# Patient Record
Sex: Female | Born: 1940 | Race: Black or African American | Hispanic: No | Marital: Single | Smoking: Former smoker
Health system: Southern US, Community
[De-identification: ages and names within clinical notes are randomized; demographics above are authoritative.]

## PROBLEM LIST (undated history)

## (undated) DIAGNOSIS — N183 Chronic kidney disease, stage 3 unspecified: Secondary | ICD-10-CM

## (undated) DIAGNOSIS — E1169 Type 2 diabetes mellitus with other specified complication: Secondary | ICD-10-CM

## (undated) DIAGNOSIS — I10 Essential (primary) hypertension: Secondary | ICD-10-CM

## (undated) DIAGNOSIS — F0390 Unspecified dementia without behavioral disturbance: Secondary | ICD-10-CM

## (undated) DIAGNOSIS — I429 Cardiomyopathy, unspecified: Secondary | ICD-10-CM

## (undated) DIAGNOSIS — F209 Schizophrenia, unspecified: Secondary | ICD-10-CM

## (undated) DIAGNOSIS — K219 Gastro-esophageal reflux disease without esophagitis: Secondary | ICD-10-CM

## (undated) DIAGNOSIS — E669 Obesity, unspecified: Secondary | ICD-10-CM

## (undated) DIAGNOSIS — C3490 Malignant neoplasm of unspecified part of unspecified bronchus or lung: Secondary | ICD-10-CM

## (undated) DIAGNOSIS — E785 Hyperlipidemia, unspecified: Secondary | ICD-10-CM

## (undated) DIAGNOSIS — F79 Unspecified intellectual disabilities: Secondary | ICD-10-CM

## (undated) DIAGNOSIS — J449 Chronic obstructive pulmonary disease, unspecified: Secondary | ICD-10-CM

## (undated) DIAGNOSIS — E119 Type 2 diabetes mellitus without complications: Secondary | ICD-10-CM

## (undated) DIAGNOSIS — I4892 Unspecified atrial flutter: Secondary | ICD-10-CM

## (undated) DIAGNOSIS — I495 Sick sinus syndrome: Secondary | ICD-10-CM

## (undated) HISTORY — DX: Malignant neoplasm of unspecified part of unspecified bronchus or lung: C34.90

## (undated) HISTORY — DX: Hyperlipidemia, unspecified: E78.5

## (undated) HISTORY — DX: Type 2 diabetes mellitus without complications: E11.9

## (undated) HISTORY — DX: Chronic obstructive pulmonary disease, unspecified: J44.9

## (undated) HISTORY — DX: Unspecified intellectual disabilities: F79

## (undated) HISTORY — DX: Gastro-esophageal reflux disease without esophagitis: K21.9

## (undated) HISTORY — DX: Unspecified dementia, unspecified severity, without behavioral disturbance, psychotic disturbance, mood disturbance, and anxiety: F03.90

## (undated) HISTORY — DX: Schizophrenia, unspecified: F20.9

## (undated) HISTORY — PX: ABDOMINAL HYSTERECTOMY: SHX81

---

## 2003-04-18 ENCOUNTER — Encounter (HOSPITAL_BASED_OUTPATIENT_CLINIC_OR_DEPARTMENT_OTHER): Admission: RE | Admit: 2003-04-18 | Discharge: 2003-04-25 | Payer: Self-pay | Admitting: Internal Medicine

## 2003-04-26 ENCOUNTER — Other Ambulatory Visit: Payer: Self-pay

## 2003-07-20 ENCOUNTER — Encounter (HOSPITAL_BASED_OUTPATIENT_CLINIC_OR_DEPARTMENT_OTHER): Admission: RE | Admit: 2003-07-20 | Discharge: 2003-08-18 | Payer: Self-pay | Admitting: Internal Medicine

## 2003-11-14 ENCOUNTER — Encounter (HOSPITAL_BASED_OUTPATIENT_CLINIC_OR_DEPARTMENT_OTHER): Admission: RE | Admit: 2003-11-14 | Discharge: 2003-11-24 | Payer: Self-pay | Admitting: Internal Medicine

## 2004-02-20 ENCOUNTER — Encounter (HOSPITAL_BASED_OUTPATIENT_CLINIC_OR_DEPARTMENT_OTHER): Admission: RE | Admit: 2004-02-20 | Discharge: 2004-04-12 | Payer: Self-pay | Admitting: Internal Medicine

## 2004-06-04 ENCOUNTER — Ambulatory Visit: Payer: Self-pay

## 2005-06-09 ENCOUNTER — Ambulatory Visit: Payer: Self-pay | Admitting: Family Medicine

## 2006-03-05 ENCOUNTER — Inpatient Hospital Stay: Payer: Self-pay

## 2006-04-24 ENCOUNTER — Emergency Department: Payer: Self-pay | Admitting: Emergency Medicine

## 2006-06-24 ENCOUNTER — Ambulatory Visit: Payer: Self-pay | Admitting: Family Medicine

## 2007-04-13 ENCOUNTER — Ambulatory Visit: Payer: Self-pay | Admitting: Gastroenterology

## 2007-06-22 ENCOUNTER — Ambulatory Visit: Payer: Self-pay | Admitting: Family Medicine

## 2007-10-07 ENCOUNTER — Ambulatory Visit: Payer: Self-pay

## 2007-11-17 ENCOUNTER — Ambulatory Visit: Payer: Self-pay

## 2008-08-17 ENCOUNTER — Emergency Department: Payer: Self-pay | Admitting: Emergency Medicine

## 2009-04-19 ENCOUNTER — Ambulatory Visit: Payer: Self-pay | Admitting: Family Medicine

## 2010-03-27 ENCOUNTER — Ambulatory Visit: Payer: Self-pay | Admitting: Gastroenterology

## 2010-04-01 LAB — PATHOLOGY REPORT

## 2010-04-22 ENCOUNTER — Ambulatory Visit: Payer: Self-pay | Admitting: Family Medicine

## 2011-05-20 ENCOUNTER — Ambulatory Visit: Payer: Self-pay | Admitting: Family Medicine

## 2011-06-14 ENCOUNTER — Ambulatory Visit: Payer: Self-pay | Admitting: Internal Medicine

## 2011-07-09 ENCOUNTER — Inpatient Hospital Stay: Payer: Self-pay | Admitting: Internal Medicine

## 2011-07-09 LAB — URINALYSIS, COMPLETE
Bilirubin,UR: NEGATIVE
Hyaline Cast: 6
Leukocyte Esterase: NEGATIVE
Ph: 5 (ref 4.5–8.0)
Protein: NEGATIVE
RBC,UR: 2 /HPF (ref 0–5)
Squamous Epithelial: NONE SEEN

## 2011-07-09 LAB — CBC
HGB: 11.3 g/dL — ABNORMAL LOW (ref 12.0–16.0)
MCH: 27.4 pg (ref 26.0–34.0)
MCHC: 31.7 g/dL — ABNORMAL LOW (ref 32.0–36.0)

## 2011-07-09 LAB — TROPONIN I: Troponin-I: <0.02 ng/mL

## 2011-07-09 LAB — PROTIME-INR: Prothrombin Time: 12.3 secs (ref 11.5–14.7)

## 2011-07-09 LAB — BASIC METABOLIC PANEL
BUN: 16 mg/dL (ref 7–18)
Calcium, Total: 8.4 mg/dL — ABNORMAL LOW (ref 8.5–10.1)
Chloride: 105 mmol/L (ref 98–107)
Co2: 26 mmol/L (ref 21–32)
EGFR (African American): >60
EGFR (Non-African Amer.): 59 — ABNORMAL LOW
Glucose: 119 mg/dL — ABNORMAL HIGH (ref 65–99)
Osmolality: 282 (ref 275–301)
Sodium: 140 mmol/L (ref 136–145)

## 2011-07-09 LAB — APTT: Activated PTT: 33.8 secs (ref 23.6–35.9)

## 2011-07-10 LAB — TROPONIN I
Troponin-I: <0.02 ng/mL
Troponin-I: <0.02 ng/mL

## 2011-07-10 LAB — CBC WITH DIFFERENTIAL/PLATELET
Basophil #: 0 10*3/uL (ref 0.0–0.1)
Basophil %: 0.5 %
Eosinophil #: 0.2 10*3/uL (ref 0.0–0.7)
Eosinophil %: 3.7 %
Lymphocyte #: 2.5 10*3/uL (ref 1.0–3.6)
Lymphocyte %: 38.4 %
MCH: 28 pg (ref 26.0–34.0)
MCHC: 33.1 g/dL (ref 32.0–36.0)
Monocyte #: 0.5 x10 3/mm (ref 0.2–0.9)
RBC: 4.07 10*6/uL (ref 3.80–5.20)
RDW: 14.5 % (ref 11.5–14.5)
WBC: 6.5 10*3/uL (ref 3.6–11.0)

## 2011-07-10 LAB — CK TOTAL AND CKMB (NOT AT ARMC)
CK, Total: 252 U/L — ABNORMAL HIGH (ref 21–215)
CK, Total: 190 U/L (ref 21–215)
CK, Total: 166 U/L (ref 21–215)
CK-MB: 1.4 ng/mL (ref 0.5–3.6)
CK-MB: 1.5 ng/mL (ref 0.5–3.6)
CK-MB: 1.3 ng/mL (ref 0.5–3.6)

## 2011-07-10 LAB — LIPID PANEL
HDL Cholesterol: 71 mg/dL — ABNORMAL HIGH (ref 40–60)
Ldl Cholesterol, Calc: 66 mg/dL (ref 0–100)
Triglycerides: 22 mg/dL (ref 0–200)
VLDL Cholesterol, Calc: 4 mg/dL — ABNORMAL LOW (ref 5–40)

## 2011-07-10 LAB — BASIC METABOLIC PANEL
BUN: 18 mg/dL (ref 7–18)
Chloride: 106 mmol/L (ref 98–107)
Creatinine: 0.92 mg/dL (ref 0.60–1.30)
Glucose: 86 mg/dL (ref 65–99)
Osmolality: 282 (ref 275–301)
Potassium: 3.4 mmol/L — ABNORMAL LOW (ref 3.5–5.1)
Sodium: 141 mmol/L (ref 136–145)

## 2011-07-14 ENCOUNTER — Ambulatory Visit: Payer: Self-pay | Admitting: Internal Medicine

## 2011-07-14 LAB — CREATININE, SERUM
Creatinine: 0.86 mg/dL (ref 0.60–1.30)
EGFR (African American): >60
EGFR (Non-African Amer.): >60

## 2011-07-22 ENCOUNTER — Ambulatory Visit: Payer: Self-pay | Admitting: Internal Medicine

## 2011-07-22 LAB — COMPREHENSIVE METABOLIC PANEL
Anion Gap: 8 (ref 7–16)
BUN: 17 mg/dL (ref 7–18)
Bilirubin,Total: 0.2 mg/dL (ref 0.2–1.0)
Calcium, Total: 9.1 mg/dL (ref 8.5–10.1)
Chloride: 107 mmol/L (ref 98–107)
Co2: 28 mmol/L (ref 21–32)
Creatinine: 0.87 mg/dL (ref 0.60–1.30)
EGFR (African American): >60
EGFR (Non-African Amer.): >60
Potassium: 3.5 mmol/L (ref 3.5–5.1)
Sodium: 143 mmol/L (ref 136–145)

## 2011-07-22 LAB — CBC CANCER CENTER
Basophil #: 0.1 x10 3/mm (ref 0.0–0.1)
Basophil %: 1.3 %
Eosinophil #: 0.2 x10 3/mm (ref 0.0–0.7)
Eosinophil %: 3.4 %
HCT: 37.1 % (ref 35.0–47.0)
Lymphocyte %: 23.3 %
MCH: 27.5 pg (ref 26.0–34.0)
MCHC: 32.5 g/dL (ref 32.0–36.0)
MCV: 85 fL (ref 80–100)
Monocyte %: 6.8 %
Neutrophil #: 4.7 x10 3/mm (ref 1.4–6.5)
WBC: 7.1 x10 3/mm (ref 3.6–11.0)

## 2011-07-22 LAB — APTT: Activated PTT: 33 secs (ref 23.6–35.9)

## 2011-07-25 ENCOUNTER — Ambulatory Visit: Payer: Self-pay | Admitting: Internal Medicine

## 2011-07-25 DIAGNOSIS — C3492 Malignant neoplasm of unspecified part of left bronchus or lung: Secondary | ICD-10-CM | POA: Insufficient documentation

## 2011-08-14 ENCOUNTER — Ambulatory Visit: Payer: Self-pay | Admitting: Internal Medicine

## 2011-08-15 LAB — CBC CANCER CENTER
Basophil #: 0 x10 3/mm (ref 0.0–0.1)
Basophil %: 0.4 %
Eosinophil #: 0.2 x10 3/mm (ref 0.0–0.7)
HCT: 36.6 % (ref 35.0–47.0)
HGB: 12.1 g/dL (ref 12.0–16.0)
Lymphocyte #: 1.4 x10 3/mm (ref 1.0–3.6)
Lymphocyte %: 21 %
MCH: 28 pg (ref 26.0–34.0)
MCV: 85 fL (ref 80–100)
Monocyte #: 0.5 x10 3/mm (ref 0.2–0.9)
Monocyte %: 7 %
Neutrophil #: 4.8 x10 3/mm (ref 1.4–6.5)
Platelet: 363 x10 3/mm (ref 150–440)
RBC: 4.33 10*6/uL (ref 3.80–5.20)
RDW: 13.9 % (ref 11.5–14.5)
WBC: 6.9 x10 3/mm (ref 3.6–11.0)

## 2011-08-22 LAB — CBC CANCER CENTER
Basophil %: 0.3 %
HCT: 36 % (ref 35.0–47.0)
HGB: 11.9 g/dL — ABNORMAL LOW (ref 12.0–16.0)
Lymphocyte #: 0.9 x10 3/mm — ABNORMAL LOW (ref 1.0–3.6)
MCH: 27.8 pg (ref 26.0–34.0)
MCHC: 33.1 g/dL (ref 32.0–36.0)
MCV: 84 fL (ref 80–100)
Monocyte #: 0.5 x10 3/mm (ref 0.2–0.9)
Neutrophil #: 4.3 x10 3/mm (ref 1.4–6.5)
Neutrophil %: 72.8 %
Platelet: 333 x10 3/mm (ref 150–440)

## 2011-09-05 LAB — CBC CANCER CENTER
Basophil #: 0 x10 3/mm (ref 0.0–0.1)
Eosinophil #: 0.2 x10 3/mm (ref 0.0–0.7)
Eosinophil %: 3.1 %
Lymphocyte #: 0.6 x10 3/mm — ABNORMAL LOW (ref 1.0–3.6)
MCH: 27 pg (ref 26.0–34.0)
MCV: 84 fL (ref 80–100)
Monocyte #: 0.6 x10 3/mm (ref 0.2–0.9)
Neutrophil #: 3.6 x10 3/mm (ref 1.4–6.5)
Neutrophil %: 72.4 %
Platelet: 304 x10 3/mm (ref 150–440)
RBC: 4.29 10*6/uL (ref 3.80–5.20)
RDW: 13.9 % (ref 11.5–14.5)

## 2011-09-14 ENCOUNTER — Ambulatory Visit: Payer: Self-pay | Admitting: Internal Medicine

## 2011-10-14 ENCOUNTER — Ambulatory Visit: Payer: Self-pay | Admitting: Internal Medicine

## 2011-10-28 LAB — CBC CANCER CENTER
Basophil #: 0 x10 3/mm (ref 0.0–0.1)
Basophil %: 0.3 %
Eosinophil %: 2.1 %
HCT: 39.3 % (ref 35.0–47.0)
HGB: 12.5 g/dL (ref 12.0–16.0)
Lymphocyte #: 0.6 x10 3/mm — ABNORMAL LOW (ref 1.0–3.6)
Lymphocyte %: 14.1 %
MCV: 84 fL (ref 80–100)
Monocyte %: 13.3 %
Neutrophil #: 3.2 x10 3/mm (ref 1.4–6.5)
RBC: 4.66 10*6/uL (ref 3.80–5.20)
RDW: 15.6 % — ABNORMAL HIGH (ref 11.5–14.5)
WBC: 4.5 x10 3/mm (ref 3.6–11.0)

## 2011-11-11 ENCOUNTER — Ambulatory Visit: Payer: Self-pay | Admitting: Internal Medicine

## 2011-11-11 LAB — CBC CANCER CENTER
Basophil #: 0 x10 3/mm (ref 0.0–0.1)
Eosinophil %: 2.9 %
HCT: 40.5 % (ref 35.0–47.0)
Lymphocyte #: 0.5 x10 3/mm — ABNORMAL LOW (ref 1.0–3.6)
MCH: 27.3 pg (ref 26.0–34.0)
MCV: 85 fL (ref 80–100)
Monocyte %: 11.4 %
Neutrophil #: 3.2 x10 3/mm (ref 1.4–6.5)
Platelet: 299 x10 3/mm (ref 150–440)
RBC: 4.75 10*6/uL (ref 3.80–5.20)
RDW: 16 % — ABNORMAL HIGH (ref 11.5–14.5)
WBC: 4.4 x10 3/mm (ref 3.6–11.0)

## 2011-11-11 LAB — HEPATIC FUNCTION PANEL A (ARMC)
Albumin: 3.7 g/dL (ref 3.4–5.0)
Bilirubin, Direct: 0.1 mg/dL (ref 0.00–0.20)
Bilirubin,Total: 0.2 mg/dL (ref 0.2–1.0)
Total Protein: 7.9 g/dL (ref 6.4–8.2)

## 2011-11-11 LAB — CREATININE, SERUM: EGFR (Non-African Amer.): 52 — ABNORMAL LOW

## 2011-11-14 ENCOUNTER — Ambulatory Visit: Payer: Self-pay | Admitting: Internal Medicine

## 2011-11-20 ENCOUNTER — Ambulatory Visit: Payer: Self-pay | Admitting: Internal Medicine

## 2011-12-24 ENCOUNTER — Ambulatory Visit: Payer: Self-pay | Admitting: Internal Medicine

## 2011-12-24 LAB — CREATININE, SERUM
EGFR (African American): >60
EGFR (Non-African Amer.): >60

## 2012-01-14 ENCOUNTER — Ambulatory Visit: Payer: Self-pay | Admitting: Internal Medicine

## 2012-01-19 ENCOUNTER — Ambulatory Visit: Payer: Self-pay | Admitting: Internal Medicine

## 2012-02-11 LAB — CBC CANCER CENTER
Eosinophil #: 0.1 x10 3/mm (ref 0.0–0.7)
HCT: 37.1 % (ref 35.0–47.0)
HGB: 12.3 g/dL (ref 12.0–16.0)
Lymphocyte %: 21.2 %
MCH: 27.9 pg (ref 26.0–34.0)
Monocyte #: 0.6 x10 3/mm (ref 0.2–0.9)
Monocyte %: 12.9 %
Neutrophil #: 2.9 x10 3/mm (ref 1.4–6.5)
Neutrophil %: 62.8 %
RDW: 14.3 % (ref 11.5–14.5)
WBC: 4.6 x10 3/mm (ref 3.6–11.0)

## 2012-02-11 LAB — CALCIUM: Calcium, Total: 9.2 mg/dL (ref 8.5–10.1)

## 2012-02-11 LAB — CREATININE, SERUM
Creatinine: 0.96 mg/dL (ref 0.60–1.30)
EGFR (African American): >60

## 2012-02-11 LAB — HEPATIC FUNCTION PANEL A (ARMC)
Albumin: 3.6 g/dL (ref 3.4–5.0)
SGOT(AST): 19 U/L (ref 15–37)
SGPT (ALT): 26 U/L (ref 12–78)
Total Protein: 7.6 g/dL (ref 6.4–8.2)

## 2012-02-12 ENCOUNTER — Emergency Department: Payer: Self-pay | Admitting: Emergency Medicine

## 2012-02-12 LAB — COMPREHENSIVE METABOLIC PANEL
Albumin: 3.6 g/dL (ref 3.4–5.0)
Alkaline Phosphatase: 83 U/L (ref 50–136)
Anion Gap: 8 (ref 7–16)
BUN: 13 mg/dL (ref 7–18)
Chloride: 106 mmol/L (ref 98–107)
Co2: 25 mmol/L (ref 21–32)
Creatinine: 0.89 mg/dL (ref 0.60–1.30)
Osmolality: 278 (ref 275–301)
Potassium: 3.7 mmol/L (ref 3.5–5.1)
SGOT(AST): 27 U/L (ref 15–37)
SGPT (ALT): 25 U/L (ref 12–78)

## 2012-02-12 LAB — URINALYSIS, COMPLETE
Bacteria: NONE SEEN
Bilirubin,UR: NEGATIVE
Blood: NEGATIVE
Leukocyte Esterase: NEGATIVE
Ph: 5 (ref 4.5–8.0)
Protein: NEGATIVE
RBC,UR: NONE SEEN /HPF (ref 0–5)
WBC UR: 1 /HPF (ref 0–5)

## 2012-02-12 LAB — CBC
HCT: 36.4 % (ref 35.0–47.0)
HGB: 11.9 g/dL — ABNORMAL LOW (ref 12.0–16.0)
MCH: 27.8 pg (ref 26.0–34.0)
MCHC: 32.8 g/dL (ref 32.0–36.0)
Platelet: 313 10*3/uL (ref 150–440)
RBC: 4.3 10*6/uL (ref 3.80–5.20)
RDW: 14.5 % (ref 11.5–14.5)

## 2012-02-14 ENCOUNTER — Ambulatory Visit: Payer: Self-pay | Admitting: Internal Medicine

## 2012-02-14 ENCOUNTER — Ambulatory Visit: Payer: Self-pay

## 2012-03-18 ENCOUNTER — Ambulatory Visit: Payer: Self-pay | Admitting: Internal Medicine

## 2012-03-26 ENCOUNTER — Ambulatory Visit: Payer: Self-pay | Admitting: Internal Medicine

## 2012-03-26 LAB — CBC CANCER CENTER
Basophil %: 0.8 %
Eosinophil #: 0.1 x10 3/mm (ref 0.0–0.7)
HGB: 12.5 g/dL (ref 12.0–16.0)
Lymphocyte %: 18.6 %
MCH: 27.5 pg (ref 26.0–34.0)
MCHC: 32.7 g/dL (ref 32.0–36.0)
MCV: 84 fL (ref 80–100)
Monocyte #: 0.5 x10 3/mm (ref 0.2–0.9)
Monocyte %: 9 %
Neutrophil #: 3.6 x10 3/mm (ref 1.4–6.5)
WBC: 5.2 x10 3/mm (ref 3.6–11.0)

## 2012-03-26 LAB — BASIC METABOLIC PANEL
Anion Gap: 2 — ABNORMAL LOW (ref 7–16)
Calcium, Total: 8.7 mg/dL (ref 8.5–10.1)
Co2: 31 mmol/L (ref 21–32)
Creatinine: 0.89 mg/dL (ref 0.60–1.30)
Glucose: 135 mg/dL — ABNORMAL HIGH (ref 65–99)
Osmolality: 279 (ref 275–301)
Sodium: 139 mmol/L (ref 136–145)

## 2012-03-26 LAB — HEPATIC FUNCTION PANEL A (ARMC)
Albumin: 3.7 g/dL (ref 3.4–5.0)
Alkaline Phosphatase: 87 U/L (ref 50–136)
Bilirubin,Total: 0.2 mg/dL (ref 0.2–1.0)
SGOT(AST): 21 U/L (ref 15–37)
SGPT (ALT): 26 U/L (ref 12–78)
Total Protein: 8 g/dL (ref 6.4–8.2)

## 2012-04-13 ENCOUNTER — Ambulatory Visit: Payer: Self-pay | Admitting: Internal Medicine

## 2012-08-04 ENCOUNTER — Ambulatory Visit: Payer: Self-pay | Admitting: Internal Medicine

## 2012-08-09 ENCOUNTER — Ambulatory Visit: Payer: Self-pay | Admitting: Internal Medicine

## 2012-08-09 LAB — HEPATIC FUNCTION PANEL A (ARMC)
Alkaline Phosphatase: 90 U/L (ref 50–136)
Bilirubin, Direct: 0.1 mg/dL (ref 0.00–0.20)
SGOT(AST): 16 U/L (ref 15–37)
SGPT (ALT): 22 U/L (ref 12–78)
Total Protein: 7.5 g/dL (ref 6.4–8.2)

## 2012-08-09 LAB — CBC CANCER CENTER
Basophil #: 0 x10 3/mm (ref 0.0–0.1)
Eosinophil #: 0.2 x10 3/mm (ref 0.0–0.7)
Eosinophil %: 3.5 %
HCT: 36.3 % (ref 35.0–47.0)
HGB: 12.2 g/dL (ref 12.0–16.0)
Lymphocyte #: 1.2 x10 3/mm (ref 1.0–3.6)
MCHC: 33.7 g/dL (ref 32.0–36.0)
Neutrophil #: 3.8 x10 3/mm (ref 1.4–6.5)
Neutrophil %: 65.9 %
Platelet: 310 x10 3/mm (ref 150–440)

## 2012-08-09 LAB — CREATININE, SERUM
Creatinine: 1.04 mg/dL (ref 0.60–1.30)
EGFR (Non-African Amer.): 54 — ABNORMAL LOW

## 2012-08-09 LAB — GLUCOSE, RANDOM: Glucose: 146 mg/dL — ABNORMAL HIGH (ref 65–99)

## 2012-08-12 ENCOUNTER — Ambulatory Visit: Payer: Self-pay | Admitting: Internal Medicine

## 2012-08-13 ENCOUNTER — Ambulatory Visit: Payer: Self-pay | Admitting: Internal Medicine

## 2012-10-01 ENCOUNTER — Observation Stay: Payer: Self-pay | Admitting: Internal Medicine

## 2012-10-01 LAB — COMPREHENSIVE METABOLIC PANEL
Anion Gap: 7 (ref 7–16)
Bilirubin,Total: 0.4 mg/dL (ref 0.2–1.0)
Calcium, Total: 9.4 mg/dL (ref 8.5–10.1)
Chloride: 102 mmol/L (ref 98–107)
Co2: 28 mmol/L (ref 21–32)
Creatinine: 0.96 mg/dL (ref 0.60–1.30)
EGFR (African American): >60
EGFR (Non-African Amer.): 60 — ABNORMAL LOW
Osmolality: 278 (ref 275–301)
SGOT(AST): 35 U/L (ref 15–37)
SGPT (ALT): 22 U/L (ref 12–78)
Sodium: 137 mmol/L (ref 136–145)
Total Protein: 7.6 g/dL (ref 6.4–8.2)

## 2012-10-01 LAB — URINALYSIS, COMPLETE
Blood: NEGATIVE
Ketone: NEGATIVE
Leukocyte Esterase: NEGATIVE
Ph: 6 (ref 4.5–8.0)
Specific Gravity: 1.004 (ref 1.003–1.030)
Squamous Epithelial: 1
WBC UR: 2 /HPF (ref 0–5)

## 2012-10-01 LAB — CBC
HCT: 38.4 % (ref 35.0–47.0)
HGB: 12.9 g/dL (ref 12.0–16.0)
MCH: 27.9 pg (ref 26.0–34.0)
RBC: 4.61 10*6/uL (ref 3.80–5.20)
RDW: 14.8 % — ABNORMAL HIGH (ref 11.5–14.5)
WBC: 7.8 10*3/uL (ref 3.6–11.0)

## 2012-10-02 LAB — HEMOGLOBIN: HGB: 10.4 g/dL — ABNORMAL LOW (ref 12.0–16.0)

## 2012-10-02 LAB — CREATININE, SERUM
Creatinine: 0.93 mg/dL (ref 0.60–1.30)
EGFR (African American): >60

## 2012-12-13 ENCOUNTER — Ambulatory Visit: Payer: Self-pay | Admitting: Internal Medicine

## 2012-12-13 LAB — HEPATIC FUNCTION PANEL A (ARMC)
Albumin: 3.5 g/dL (ref 3.4–5.0)
Bilirubin, Direct: <0.05 mg/dL (ref 0.00–0.20)
SGOT(AST): 17 U/L (ref 15–37)
SGPT (ALT): 26 U/L (ref 12–78)

## 2012-12-13 LAB — CBC CANCER CENTER
Basophil #: 0 x10 3/mm (ref 0.0–0.1)
Eosinophil %: 3.7 %
HCT: 38.2 % (ref 35.0–47.0)
HGB: 12.3 g/dL (ref 12.0–16.0)
Lymphocyte #: 1.6 x10 3/mm (ref 1.0–3.6)
MCH: 26.9 pg (ref 26.0–34.0)
Monocyte #: 0.7 x10 3/mm (ref 0.2–0.9)
Monocyte %: 10.5 %
Platelet: 360 x10 3/mm (ref 150–440)
RDW: 14.5 % (ref 11.5–14.5)

## 2012-12-13 LAB — CREATININE, SERUM
Creatinine: 1.06 mg/dL (ref 0.60–1.30)
EGFR (African American): >60

## 2013-01-13 ENCOUNTER — Ambulatory Visit: Payer: Self-pay | Admitting: Internal Medicine

## 2013-04-13 ENCOUNTER — Ambulatory Visit: Payer: Self-pay | Admitting: Internal Medicine

## 2013-04-13 LAB — CBC CANCER CENTER
BASOS ABS: 0.1 x10 3/mm (ref 0.0–0.1)
Basophil %: 1.3 %
EOS PCT: 2.5 %
Eosinophil #: 0.2 x10 3/mm (ref 0.0–0.7)
HCT: 41.7 % (ref 35.0–47.0)
HGB: 13.5 g/dL (ref 12.0–16.0)
LYMPHS ABS: 1.8 x10 3/mm (ref 1.0–3.6)
Lymphocyte %: 29 %
MCH: 27.1 pg (ref 26.0–34.0)
MCHC: 32.3 g/dL (ref 32.0–36.0)
MCV: 84 fL (ref 80–100)
MONO ABS: 0.7 x10 3/mm (ref 0.2–0.9)
MONOS PCT: 10.5 %
NEUTROS ABS: 3.6 x10 3/mm (ref 1.4–6.5)
Neutrophil %: 56.7 %
Platelet: 341 x10 3/mm (ref 150–440)
RBC: 4.97 10*6/uL (ref 3.80–5.20)
RDW: 14.9 % — ABNORMAL HIGH (ref 11.5–14.5)
WBC: 6.4 x10 3/mm (ref 3.6–11.0)

## 2013-04-13 LAB — BASIC METABOLIC PANEL
Anion Gap: 9 (ref 7–16)
BUN: 12 mg/dL (ref 7–18)
CHLORIDE: 103 mmol/L (ref 98–107)
Calcium, Total: 9.6 mg/dL (ref 8.5–10.1)
Co2: 30 mmol/L (ref 21–32)
Creatinine: 0.96 mg/dL (ref 0.60–1.30)
EGFR (African American): >60
EGFR (Non-African Amer.): 60 — ABNORMAL LOW
GLUCOSE: 101 mg/dL — AB (ref 65–99)
Osmolality: 283 (ref 275–301)
Potassium: 3.7 mmol/L (ref 3.5–5.1)
SODIUM: 142 mmol/L (ref 136–145)

## 2013-04-13 LAB — HEPATIC FUNCTION PANEL A (ARMC)
ALBUMIN: 4.2 g/dL (ref 3.4–5.0)
ALK PHOS: 110 U/L
BILIRUBIN TOTAL: 0.3 mg/dL (ref 0.2–1.0)
Bilirubin, Direct: 0.1 mg/dL (ref 0.00–0.20)
SGOT(AST): 20 U/L (ref 15–37)
SGPT (ALT): 21 U/L (ref 12–78)
TOTAL PROTEIN: 8.6 g/dL — AB (ref 6.4–8.2)

## 2013-05-13 ENCOUNTER — Ambulatory Visit: Payer: Self-pay | Admitting: Internal Medicine

## 2013-07-26 ENCOUNTER — Emergency Department: Payer: Self-pay | Admitting: Emergency Medicine

## 2013-07-26 LAB — CBC
HCT: 38.9 % (ref 35.0–47.0)
HGB: 12.3 g/dL (ref 12.0–16.0)
MCH: 27.1 pg (ref 26.0–34.0)
MCHC: 31.6 g/dL — AB (ref 32.0–36.0)
MCV: 86 fL (ref 80–100)
PLATELETS: 255 10*3/uL (ref 150–440)
RBC: 4.53 10*6/uL (ref 3.80–5.20)
RDW: 14.4 % (ref 11.5–14.5)
WBC: 9.5 10*3/uL (ref 3.6–11.0)

## 2013-07-26 LAB — URINALYSIS, COMPLETE
BLOOD: NEGATIVE
Bacteria: NONE SEEN
Bilirubin,UR: NEGATIVE
Glucose,UR: NEGATIVE mg/dL (ref 0–75)
Ketone: NEGATIVE
Leukocyte Esterase: NEGATIVE
NITRITE: NEGATIVE
PH: 5 (ref 4.5–8.0)
Protein: 30
RBC,UR: <1 /HPF (ref 0–5)
SPECIFIC GRAVITY: 1.029 (ref 1.003–1.030)
WBC UR: 1 /HPF (ref 0–5)

## 2013-07-26 LAB — COMPREHENSIVE METABOLIC PANEL
AST: 17 U/L (ref 15–37)
Albumin: 3.4 g/dL (ref 3.4–5.0)
Alkaline Phosphatase: 95 U/L
Anion Gap: 5 — ABNORMAL LOW (ref 7–16)
BUN: 17 mg/dL (ref 7–18)
Bilirubin,Total: 0.3 mg/dL (ref 0.2–1.0)
CALCIUM: 8.8 mg/dL (ref 8.5–10.1)
CO2: 28 mmol/L (ref 21–32)
CREATININE: 1.15 mg/dL (ref 0.60–1.30)
Chloride: 105 mmol/L (ref 98–107)
EGFR (Non-African Amer.): 48 — ABNORMAL LOW
GFR CALC AF AMER: 55 — AB
Glucose: 104 mg/dL — ABNORMAL HIGH (ref 65–99)
Osmolality: 278 (ref 275–301)
Potassium: 3.7 mmol/L (ref 3.5–5.1)
SGPT (ALT): 22 U/L (ref 12–78)
Sodium: 138 mmol/L (ref 136–145)
Total Protein: 7.6 g/dL (ref 6.4–8.2)

## 2013-07-26 LAB — LIPASE, BLOOD: Lipase: 117 U/L (ref 73–393)

## 2013-07-27 LAB — URINE CULTURE

## 2013-08-19 ENCOUNTER — Ambulatory Visit: Payer: Self-pay | Admitting: Internal Medicine

## 2013-08-19 LAB — HEPATIC FUNCTION PANEL A (ARMC)
ALK PHOS: 94 U/L
Albumin: 3.5 g/dL (ref 3.4–5.0)
Bilirubin, Direct: <0.05 mg/dL (ref 0.00–0.20)
Bilirubin,Total: 0.2 mg/dL (ref 0.2–1.0)
SGOT(AST): 15 U/L (ref 15–37)
SGPT (ALT): 23 U/L
Total Protein: 7.5 g/dL (ref 6.4–8.2)

## 2013-08-19 LAB — CBC CANCER CENTER
BASOS ABS: 0.1 x10 3/mm (ref 0.0–0.1)
Basophil %: 1.2 %
EOS PCT: 5.9 %
Eosinophil #: 0.3 x10 3/mm (ref 0.0–0.7)
HCT: 37.9 % (ref 35.0–47.0)
HGB: 12.3 g/dL (ref 12.0–16.0)
LYMPHS ABS: 1.5 x10 3/mm (ref 1.0–3.6)
Lymphocyte %: 34.2 %
MCH: 27.6 pg (ref 26.0–34.0)
MCHC: 32.4 g/dL (ref 32.0–36.0)
MCV: 85 fL (ref 80–100)
Monocyte #: 0.5 x10 3/mm (ref 0.2–0.9)
Monocyte %: 11.3 %
NEUTROS ABS: 2 x10 3/mm (ref 1.4–6.5)
NEUTROS PCT: 47.4 %
Platelet: 306 x10 3/mm (ref 150–440)
RBC: 4.45 10*6/uL (ref 3.80–5.20)
RDW: 14.7 % — ABNORMAL HIGH (ref 11.5–14.5)
WBC: 4.3 x10 3/mm (ref 3.6–11.0)

## 2013-08-19 LAB — CREATININE, SERUM
CREATININE: 1.02 mg/dL (ref 0.60–1.30)
EGFR (African American): >60
EGFR (Non-African Amer.): 55 — ABNORMAL LOW

## 2013-09-13 ENCOUNTER — Ambulatory Visit: Payer: Self-pay | Admitting: Internal Medicine

## 2013-12-15 ENCOUNTER — Ambulatory Visit: Payer: Self-pay | Admitting: Internal Medicine

## 2013-12-15 LAB — CBC CANCER CENTER
BASOS ABS: 0 x10 3/mm (ref 0.0–0.1)
BASOS PCT: 0.7 %
Eosinophil #: 0.3 x10 3/mm (ref 0.0–0.7)
Eosinophil %: 5.3 %
HCT: 38.1 % (ref 35.0–47.0)
HGB: 12.3 g/dL (ref 12.0–16.0)
LYMPHS PCT: 30.8 %
Lymphocyte #: 1.7 x10 3/mm (ref 1.0–3.6)
MCH: 27.6 pg (ref 26.0–34.0)
MCHC: 32.4 g/dL (ref 32.0–36.0)
MCV: 85 fL (ref 80–100)
MONO ABS: 0.4 x10 3/mm (ref 0.2–0.9)
Monocyte %: 7.5 %
Neutrophil #: 3 x10 3/mm (ref 1.4–6.5)
Neutrophil %: 55.7 %
Platelet: 303 x10 3/mm (ref 150–440)
RBC: 4.47 10*6/uL (ref 3.80–5.20)
RDW: 15 % — AB (ref 11.5–14.5)
WBC: 5.5 x10 3/mm (ref 3.6–11.0)

## 2013-12-15 LAB — HEPATIC FUNCTION PANEL A (ARMC)
Albumin: 3.7 g/dL (ref 3.4–5.0)
Alkaline Phosphatase: 94 U/L
Bilirubin, Direct: 0.1 mg/dL (ref 0.0–0.2)
Bilirubin,Total: 0.1 mg/dL — ABNORMAL LOW (ref 0.2–1.0)
SGOT(AST): 14 U/L — ABNORMAL LOW (ref 15–37)
SGPT (ALT): 23 U/L
Total Protein: 7.5 g/dL (ref 6.4–8.2)

## 2013-12-15 LAB — BASIC METABOLIC PANEL
Anion Gap: 8 (ref 7–16)
BUN: 19 mg/dL — AB (ref 7–18)
CALCIUM: 9.1 mg/dL (ref 8.5–10.1)
CHLORIDE: 106 mmol/L (ref 98–107)
Co2: 30 mmol/L (ref 21–32)
Creatinine: 1.04 mg/dL (ref 0.60–1.30)
GFR CALC NON AF AMER: 55 — AB
Glucose: 183 mg/dL — ABNORMAL HIGH (ref 65–99)
OSMOLALITY: 294 (ref 275–301)
Potassium: 3.8 mmol/L (ref 3.5–5.1)
SODIUM: 144 mmol/L (ref 136–145)

## 2014-01-13 ENCOUNTER — Ambulatory Visit: Payer: Self-pay | Admitting: Internal Medicine

## 2014-04-28 ENCOUNTER — Other Ambulatory Visit: Payer: Self-pay | Admitting: Internal Medicine

## 2014-04-28 DIAGNOSIS — C3411 Malignant neoplasm of upper lobe, right bronchus or lung: Secondary | ICD-10-CM

## 2014-05-02 NOTE — Consult Note (Signed)
Reason for Visit: This 74 year old Female patient presents to the clinic for initial evaluation of  Lung cancer .   Referred by Dr. Ma Hillock.  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 She has a history of schizophrenia and mental retardation. Multiple other comorbidities. MRI of the brain without contrast did not show any lesions. Chest x-ray in 07/09/11 showed masslike density in the posterior left mid lung. This was confirmed on CT scan showed a pleural-based mass in the left lower lobe measuring approximate 4.6 cm. She underwent a CT-guided fine-needle aspiration favoring adenocarcinoma. Family has refused interventional chemotherapy for presumed clinical stage IIb disease. I have asked to evaluate the patient for possibility of radiation therapy. She is doing fair this point time specifically denies significant cough hemoptysis or chest tightness.  Past Hx:    NIDDM:    Hypercholesterolemia:    HTN:    Asthma:    GERD - Esophageal Reflux:    Paranoid Schizophrenia:   Past, Family and Social History:   Past Medical History positive    Cardiovascular hypertension    Respiratory asthma; COPD    Gastrointestinal GERD    Endocrine diabetes mellitus    Neurological/Psychiatric Dementia, mental retardation    Past Medical History Comments Iron deficiency anemia    Family History noncontributory    Social History positive    Social History Comments Greater than 50-pack-year smoking  history, lives in a group home no history of alcohol use    Additional Past Medical and Surgical History Seen accompanied by caregiver today   Allergies:   NKDA: None  Home Meds:  Home Medications: Medication Instructions Status  lisinopril 10 mg oral tablet 1 tab(s) orally once a day for high blood pressure. Active  lorazepam 0.5 mg oral tablet 1 tab(s) orally once a day (in the morning) for anxiety. Active  multivitamin 1 tab(s) orally once a day for supplement. Active  potassium chloride 10 mEq oral tablet, extended release 1 tab(s) orally once a day for potassium supplement. Active  omeprazole 20 mg oral delayed release capsule 1 cap(s) orally once a day for gerd. Active  hydrochlorothiazide 25 mg oral tablet 1 tab(s) orally once a day for high blood pressure. Active  amlodipine 10 mg oral tablet 1 tab(s) orally once a day for high blood pressure. Active  donepezil 10 mg oral tablet 0.5 tab(s) orally 2 times a day for dementia. Active  Namenda 10 mg oral tablet 1 tab(s) orally 2 times a day for dementia. Active  ferrous sulfate 325 mg (65 mg elemental iron) oral tablet 1 tab(s) orally 2 times a day for anemia. Active  Advair Diskus 250 mcg-50 mcg inhalation powder 1 puff(s) inhaled 2 times a day for breathing. rinse mouth after using Active  metformin 500 mg oral tablet 1 tab(s) orally 2 times a day with food for diabetes. Active  perphenazine 4 mg oral tablet 1 tab(s) orally 3 times a day for eps. Active  Seroquel XR 300 mg oral tablet, extended release 2  tab(s) orally once a day at 4pm for antipsychotic. Active  simvastatin 20 mg oral tablet 1 tab(s) orally once a day (at bedtime) to improve cholesterol. Active  lorazepam 0.5 mg oral tablet 0.5 tab(s) orally once a day as needed for anxiety. (limit 1 dose per 24 hours) Active  hydrOXYzine hydrochloride 50 mg oral tablet 1 tab(s) orally once a day (at bedtime) as needed. Active  Tylenol 325 mg oral tablet 1 tab(s) orally once a day as  needed for pain/fever. Active  Ventolin HFA 90 mcg/inh inhalation aerosol 2 puff(s) inhaled every 4 to 6 hours as needed for wheezing, cough, or shortness of breath.  Active  Debrox 6.5% otic solution 2 drop(s) to affected ears once a day as needed for wax build up. Active  Mylanta oral suspension 15 milliliter(s) orally every 4 hours as needed for indigestion. Active  Milk of Magnesia 40 milliliter(s) orally once a day as needed for constipation.  Active   Review of Systems:   General negative    Performance Status (ECOG) 1    Skin negative    Breast negative    Ophthalmologic negative    ENMT negative    Respiratory and Thorax see HPI    Cardiovascular see HPI    Gastrointestinal see HPI    Genitourinary negative    Musculoskeletal negative    Neurological negative    Psychiatric see HPI    Hematology/Lymphatics negative    Endocrine negative    Allergic/Immunologic negative   Nursing Notes:  Nursing Vital Signs and Chemo Nursing Nursing Notes: *CC Vital Signs Flowsheet:   17-Jul-13 08:06   Pulse Pulse 78   Respirations Respirations 20   SBP SBP 121   DBP DBP 78   Pain Scale (0-10)  0   Current Weight (kg) (kg) 84.6   Height (cm) centimeters 157.4   BSA (m2) 1.8   Physical Exam:  General/Skin/HEENT:   General normal    Skin normal    Eyes normal    ENMT normal    Head and Neck normal    Additional PE Well-developed slightly retarded female in NAD. Lungs are clear to A&P cardiac examination shows regular rate and rhythm. Abdomen is benign with no organomegaly or masses noted. No point tenderness is noted in her lateral ribs. Abdomen is benign with no organomegaly or masses noted.   Breasts/Resp/CV/GI/GU:   Respiratory and Thorax normal    Cardiovascular normal    Gastrointestinal normal    Genitourinary normal   MS/Neuro/Psych/Lymph:   Musculoskeletal normal    Neurological normal    Lymphatics normal   Assessment and  Plan:  Impression:   clinical stage IIb adenocarcinoma of the left lung in [age] female with slight mental retardation  Plan:   I discussed the case personally with Dr. Ma Hillock. Sinus patient is refusing any interventional chemotherapy. I have proposed going ahead with radiation therapy with curative intent. Son and has except for that. We will go ahead and plan on 4000 cGy over 4 weeks and evaluate for response. If patient can tolerate 4 weeks of treatment and we see significant response we will go ahead and boost another 3000 cGy using IMRT treatment planning and delivery. Risks and benefits of treatment were reviewed with the patient and her caregivers. Caregiver was especially made aware of potential for increasing shortness of breath fatigue skin irritation and progressive cough.  I would like to take this opportunity to thank you for allowing me to continue to participate  in this patient's care.  CC Referral:   cc: Dr. Rodena Medin   Electronic Signatures: Baruch Gouty Roda Shutters (MD)  (Signed 23-Jul-13 16:35)  Authored: HPI, Diagnosis, Past Hx, PFSH, Allergies, Home Meds, ROS, Nursing Notes, Physical Exam, Encounter Assessment and Plan, CC Referring Physician   Last Updated: 23-Jul-13 16:35 by Armstead Peaks (MD)

## 2014-05-05 NOTE — Discharge Summary (Signed)
PATIENT NAME:  Cheryl [name], Cheryl [name] MR#:  [id number] DATE OF BIRTH:  [birth year]  DATE OF ADMISSION:  10/01/2012 DATE OF DISCHARGE:  10/03/2012  ADMITTING DIAGNOSIS: Intractable vomiting.   DISCHARGE DIAGNOSES:  1.  Intermittent nausea and vomiting, status post EGD on 10/03/2012 by Dr. Allen Norris where LA grade D esophagitis as well as hiatal hernia and gastritis were noted.  2.  Hematemesis, resolved.  3.  Acute posthemorrhagic anemia.  4.  History of diabetes mellitus.  5.  Hypertension.  6.  Hyperlipidemia.  7.  Asthma. 8.  Gastroesophageal reflux disease.  9.  Schizophrenia, which is stable.   DISCHARGE CONDITION: Stable.   DISCHARGE MEDICATIONS: The patient is to resume: 1.  Lisinopril 10 mg p.o. daily.  2.  Multivitamins once daily.  3.  Iron sulfate 325 p.o. twice daily.  4.  Tylenol 325 mg p.o. once daily as needed.  5.  Advair Diskus 250/50, 1 puff twice daily.  6.  Metformin 500 mg p.o. twice daily.  7.  Aricept 10 mg p.o. 1/2 tablet twice daily.  8.  Namenda XR 21 mg daily.  9.  Albuterol 1 to 2 puffs every 4 hours as needed.  10.  Norvasc 10 mg p.o. daily.  11.  Modafinil 100 mg p.o. 1/2 tablet once daily.  12.  Xanax 0.25 mg around-the-clock twice a day.  13.  Perphenazine 4 mg 3 times daily.  14.  Seroquel XR 300 mg p.o. daily.  15.  Vitamin D2 50,000 units once monthly.  16.  Zocor 20 mg p.o. daily at bedtime.  17.  Mylanta 400/400/40 in 5 mL oral suspension, 15 mL every 4 hours as needed. 18.  Milk of magnesia 8% oral solution 40 mL once daily as needed for constipation.  19.  Promethazine 25 mg p.o. every 6 hours as needed for vomiting.  20.  New medication: Omeprazole 40 mg p.o. daily, this is a new dose.   The patient was advised not to take HCTZ or potassium supplements unless recommended by primary care physician, Dr. Quay Burow.    HOME OXYGEN: None.   DIET: 2 grams salt, low fat, low cholesterol, carbohydrate-controlled diet, mechanical soft. Advance to regular as  tolerated.   ACTIVITY LIMITATIONS: As tolerated.   FOLLOW-UP APPOINTMENT: With Dr. Quay Burow in 2 days after discharge and Dr. Allen Norris, Gastroenterology, as needed.   CONSULTANTS: Dr. Allen Norris, Social Work, Care Management.  PROCEDURES: Upper GI endoscopy 10/03/2012 by Dr. Allen Norris revealing grade LA grade D esophagitis which was biopsied, hiatal hernia, gastritis which was also biopsied and normal examined duodenum.   RADIOLOGIC STUDIES: None.   LABORATORY DATA:  Done in the Emergency Room 10/01/2012 revealed mild elevation of BUN to 19, glucose 129; otherwise, BMP was unremarkable. The patient's lipase level was normal at 157. Liver enzymes were normal. CBC was within normal limits. White blood cell count 7.8, hemoglobin 12.9 and platelet count was 384. Urinalysis was unremarkable.  HISTORY AND PHYSICAL: The patient is a 74 year old African American female with past medical history significant for history of diabetes mellitus, history of schizophrenia, hypertension, gastroesophageal reflux disease who presents to the hospital with complaints of intermittent nausea and vomiting on and off for the past 1 week.  Please refer to Dr. Gus Height Patel's admission note on the 10/01/2012.  She was brought to the Emergency Room because she had coffee-ground emesis.  She also tested Hemoccult positive in the Emergency Room.  Overall, she was hemodynamically stable but was admitted to the hospital for further evaluation. Her  vital signs on admission: She was afebrile, pulse was 94, blood pressure 127/57.  O2 sats were 96% on room air. Physical exam was unremarkable.  HOSPITAL COURSE: The patient was admitted to the hospital for further evaluation. She was given IV fluids and was started on a clear liquid diet. Consultation with Dr. Allen Norris was obtained. Dr. Allen Norris felt that the patient would benefit from upper GI endoscopy evaluation and proceeded to that on 10/03/2012.  It  revealed gastritis as well as esophagitis and hiatal hernia.  It was felt that the patient's intermittent nausea and vomiting could have been related to inflammation, and her proton pump inhibitor, omeprazole, was advanced to 40 mg once daily dose. The patient is to continue nausea medications as needed, and she may benefit from gastroparesis evaluation if she continues having symptoms despite therapy.   In regards to hematemesis, the patient's hematemesis resolved on arrival to the hospital. The patient was rehydrated, and her hemoglobin level was followed. On the day of discharge, the patient's hemoglobin level was 11.6, which is approximately 1 gram drop from arrival date.  The patient is to continue iron supplementation upon discharge. No need to transfuse.   In regards to diabetes, hypertension, hyperlipidemia as well as schizophrenia, the patient is to continue her outpatient management. The patient is being discharged in stable condition with the above-mentioned medications and followup. Her vital signs on the day of discharge: Temperature was 97.4, pulse was 90s to (Dictation Anomaly) 101  , respiration rate was 17 to 18, blood pressure 116/80, saturation was 93 to 96% on room of air at rest. Of note, she was able to tolerate a regular diet on the day of discharge and did not have any nausea, vomiting, or any other (Dictation Anomaly) abdominal  symptoms.   TIME SPENT: 40 minutes.   ____________________________ Theodoro Grist, MD rv:cb D: 10/03/2012 15:53:55 ET T: 10/03/2012 21:14:18 ET JOB#: [id number]  cc: Theodoro Grist, MD, <Dictator> Ellamae Sia, MD Shaelynn Dragos MD ELECTRONICALLY SIGNED 10/18/2012 11:13

## 2014-05-05 NOTE — H&P (Signed)
PATIENT NAME:  Cheryl [name], LUTES MR#:  [id number] DATE OF BIRTH:  June 20, 1940  DATE OF ADMISSION:  10/01/2012  PRIMARY CARE PHYSICIAN: [place]   CHIEF COMPLAINT: Vomiting on and off for 1 week.  HISTORY OF PRESENT ILLNESS: Cheryl [name] is a 74 year old African American female with history of paranoid schizophrenia, type 2 diabetes, hypertension and acid reflux who comes to the Emergency Room from [facility] after she started having on and off vomiting for the past 1 week. She was seen a couple of days ago at [facility] and was given p.o. Phenergan. The patient continues to eat regular food and has been having episodes of vomiting on and off. She came to the Emergency Room today after she had coffee-ground emesis. She also tested Hemoccult positive. In the Emergency Room, the patient received IV fluids. She is hemodynamically stable. She denies any abdominal pain, diarrhea or any vomiting. She is requesting regular food. The patient is currently getting IV fluids. She is going to be admitted for further evaluation and management on her intractable vomiting.   PAST MEDICAL HISTORY: 1.  Type 2 diabetes.  2.  Hypercholesterolemia.  3.  Hypertension.  5.  Asthma.  6.  GERD.  7.  Paranoid schizophrenia.   MEDICATIONS: 1.  Zocor 20 mg p.o. at bedtime.  2.  Xanax 0.25 mg b.i.d.  3.  Vitamin D2 50,000 international units once a month.  4.  Tylenol 325 mg 1 tablet once a day as needed.  5.  Seroquel XR 300 mg daily.  6.  Promethazine 25 mg 1 tablet every 6 hours as needed.  7.  Potassium chloride 10 mEq p.o. daily.  8.  Perphenazine 4 mg 1 tablet 3 times a day.  9.  Omeprazole 20 mg daily.  10.  Norvasc 10 mg daily.  11.  Namenda XR 21 mg once a day.  12.  Mylanta 15 mL every 4 hours as needed.  13.  Multivitamin p.o. daily.  14.  Modafinil 100 mg 1/2 tablet daily.  15.  Milk of magnesia 40 mL once a day as needed for constipation.  16.  Metformin 500 mg  1 tablet b.i.d.  17.  Lisinopril 10 mg daily.  18.  Hydrochlorothiazide 25 mg p.o. daily.  19.  Ferrous sulfate 325 mg p.o. b.i.d.  20.  Aspirin 325 mg p.o. daily.  21.  Aricept 10 mg 1/2 tablet b.i.d.  22.  Albuterol 1 to 2 puffs every 4 hours as needed.  23.  Advair 250/50 one puff b.i.d.   ALLERGIES: No known drug allergies.   SOCIAL HISTORY: The patient is a resident at [facility]. She denies smoking or alcohol.   FAMILY HISTORY: Positive for hypertension, from old records.   REVIEW OF SYSTEMS: CONSTITUTIONAL: No fever, fatigue, weakness.  EYES: No blurred or double vision. No glaucoma or cataracts.  ENT: No tinnitus, ear pain, hearing loss.  RESPIRATORY: No cough, wheeze, hemoptysis, dyspnea or COPD. CARDIOVASCULAR: No chest pain, orthopnea, edema or arrhythmia. Positive for hypertension.  GASTROINTESTINAL: Positive for vomiting and nausea. No abdominal pain or constipation.  GENITOURINARY: No dysuria, hematuria, frequency or incontinence.  ENDOCRINE: No polyuria, nocturia or thyroid problems.  HEMATOLOGY: No anemia or easy bruising.  SKIN: No acne, rash or any lesions.  MUSCULOSKELETAL: Positive for arthritis. No swelling or gout.  NEUROLOGIC: No CVA, TIA, numbness, weakness or dysarthria.  PSYCHIATRIC: Positive for paranoid schizophrenia. There is no anxiety or depression.  All other systems reviewed and negative.   PHYSICAL EXAMINATION: GENERAL: The patient is awake, alert and oriented x 3, not in acute distress. She is morbidly obese. VITAL SIGNS: She is afebrile. Pulse is 94, blood pressure 127/57 and pulse ox is 96% on room air.  HEENT: Atraumatic, normocephalic. Pupils are equal, round and reactive to light and accommodation. EOM intact. Oral mucosa is moist.  NECK: Supple. No JVD. No carotid bruit.  LUNGS: Clear to auscultation bilaterally. No rales, rhonchi, respiratory distress or labored breathing.  HEART: Both the heart sounds are normal. Rate and rhythm  regular. PMI not lateralized. Chest is nontender. Good pedal pulses. Good femoral pulses. No lower extremity edema.  ABDOMEN: Obese, soft and nontender. No organomegaly. Positive bowel sounds.  NEUROLOGIC: Grossly intact cranial nerves II through XII. No motor or sensory deficit.  PSYCHIATRIC: The patient is awake, alert and oriented x 3.  SKIN: Warm and dry.  LABORATORY DATA: CBC within normal limits. Comprehensive metabolic panel within normal limits. Lipase 159.   ASSESSMENT AND PLAN: 74 year old Cheryl [name] with history of paranoid schizophrenia, type 2 diabetes and hypertension who comes in with: 1.  Intractable vomiting. The patient has what appears to be unlikely viral syndrome. The patient is improving now, but did have an episode of coffee-ground emesis for which she was brought here. Her hemoglobin and hematocrit is stable. She is hemodynamically stable. She is on p.o. omeprazole, which we will continue. She received a dose of IV Protonix in the Emergency Room. She tested Hemoccult positive. We will repeat hemoglobin in the morning, give IV fluids at this time, start clear liquid diet and advance as tolerated.  2.  Hemoccult positive stools with coffee-ground emesis. Has resolved at this time. I will continue IV fluids. Clear liquid diet for now. Continue proton pump inhibitor. If the patient continues to have coffee-ground emesis, then do gastroenterology consult, else work-up can be done as outpatient.  3.  Hypertension. Continue hydrochlorothiazide and lisinopril.  4.  Type 2 diabetes. The patient is on clear liquid diet. I will hold off on metformin at this time. Will give sliding scale insulin.  5.  Deep vein thrombosis prophylaxis. The patient is ambulatory.   Further work-up according to the patient's clinical course. Hospital admission plan was discussed with the patient and the patient's group home caregiver.   TIME SPENT: 50 minutes. ____________________________ Hart Rochester  Posey Pronto, MD sap:sb D: 10/01/2012 14:59:09 ET T: 10/01/2012 15:26:49 ET JOB#: [id number]  cc: Milus Fritze A. Posey Pronto, MD, <Dictator> Ilda Basset MD ELECTRONICALLY SIGNED 10/02/2012 15:03

## 2014-05-05 NOTE — Consult Note (Signed)
Brief Consult Note: Diagnosis: Upper GI bleed with drop in Hb.   Patient was seen by consultant.   Orders entered.   Comments: The patient will be set up for an upper endoscopy. She denies having a colonoscopy in the past but was found to have one in 2012 without any signficant findings.  Electronic Signatures: Lucilla Lame (MD)  (Signed 20-Sep-14 12:02)  Authored: Brief Consult Note   Last Updated: 20-Sep-14 12:02 by Lucilla Lame (MD)

## 2014-05-05 NOTE — Consult Note (Signed)
PATIENT NAME:  Cheryl [name], Cheryl [name] MR#:  [id number] DATE OF BIRTH:  Oct 31, 1940  DATE OF CONSULTATION:  10/02/2012  CONSULTING PHYSICIAN:  Lucilla Lame, MD  CONSULTING SERVICE: Gastroenterology.   REASON FOR CONSULTATION: Anemia with coffee-ground emesis.   HISTORY OF PRESENT ILLNESS: This patient is a 74 year old woman who has diabetes and hypertension and a history of acid. The patient had a colonoscopy in 2012 and an upper endoscopy at that time. No cause for her present symptoms was found at that time. The patient was brought to the Emergency Room from the group home where she resides because of paranoid schizophrenia. The patient has had nausea and vomiting and was given Phenergan. The patient had some coffee-ground emesis prior to coming to the Emergency Room and was found to have heme-positive stools. Her blood work at the time of admission showed her hemoglobin to be normal at 12.9, but today it was down to 10.4. The patient denies any abdominal pain, fevers, chills, black stools or bloody stools.   PAST MEDICAL HISTORY: Type 2 diabetes, hypercholesterolemia, hypertension, asthma, GERD, paranoid schizophrenia.   MEDICATIONS: Zocor, Xanax, vitamin D, Tylenol, Seroquel, promethazine, potassium chloride, perphenazine, omeprazole, Norvasc, Namenda, Mylanta, modafinil, metformin, lisinopril, hydrochlorothiazide, ferrous sulfate, Aricept, albuterol, Advair and aspirin.   ALLERGIES: No known drug allergies.   SOCIAL HISTORY: The patient lives in a group home. She denies tobacco or alcohol abuse.   FAMILY HISTORY: Noncontributory.   REVIEW OF SYSTEMS: A 10-point review of systems was negative except what was stated above.   PHYSICAL EXAMINATION: GENERAL: The patient is alert and oriented x3, in no apparent distress.  VITAL SIGNS: Temperature 98.7, pulse 99, respirations 17, blood pressure 91/57, pulse oximetry 98%.  HEENT: Normocephalic, atraumatic. Extraocular motor intact. Pupils equally round  and reactive to light and accommodation. NECK: Without JVD, without lymphadenopathy.  LUNGS: Clear to auscultation bilaterally.  HEART: Regular rate and rhythm without murmurs, rubs or gallops.  ABDOMEN: Soft, nontender, nondistended, without hepatosplenomegaly.  EXTREMITIES: Without cyanosis, clubbing or edema.  NEUROLOGICAL: Grossly intact.  PSYCHIATRIC: The patient is alert and oriented.  SKIN: Without any rashes or lesions.   ANCILLARY SERVICES: As stated above.   ASSESSMENT AND PLAN: This patient is a 74 year old woman who came in with nausea and vomiting. She has not had any nausea and vomiting today. The patient did have coffee-ground emesis and a hemoglobin drop of 2 points. The patient will be set up for an EGD for tomorrow. The patient will be n.p.o. after midnight.   Thank you very much for involving me in the care of this patient. If you have any questions, please do not hesitate to call.  ____________________________ Lucilla Lame, MD dw:jm D: 10/02/2012 18:19:12 ET T: 10/02/2012 19:53:32 ET JOB#: [id number]  cc: Lucilla Lame, MD, <Dictator> Lucilla Lame MD ELECTRONICALLY SIGNED 10/06/2012 9:24

## 2014-05-07 NOTE — H&P (Signed)
PATIENT NAME:  Cheryl [name], Cheryl [name] MR#:  [id number] DATE OF BIRTH:  1940/12/02  DATE OF ADMISSION:  07/09/2011  REFERRING PHYSICIAN: Dr. Robet Leu PRIMARY CARE PHYSICIAN: Dr. Rodman Key    CHIEF COMPLAINT: Slurred speech, unsteady gait, possible CVA, transient ischemic attack.   HISTORY OF PRESENT ILLNESS: Patient is a difficult historian as she has dementia. The patient's caregiver was by bedside. As per her this is a 74 year old from [place] female with history of dementia, hypertension, diabetes, schizophrenia, mental retardation lives in a group home, [facility] owned by Barbie Haggis presents with disequilibrium, slurred speech which began around 12:00 p.m. The caregiver came at 4:00 p.m. was reported that patient has been falling to her right side. She had actually fallen down two times. She had been having symptoms since 12:00 p.m. She normally has some baseline confusion due to her schizophrenia and mental retardation and dementia, however, she has been more agitated today. Apparently patient has been lucid periodically and would claim no shortness of breath, chest pain, nausea, vomiting, diarrhea. She said she had been feeling weak and staff has stated that she has been slurred in her speech. In the Emergency Room she had a head CT and which showed no acute intracranial abnormality. EKG and labs drawn. I have ordered aspirin upon seeing the patient. We are asked to admit the patient for possible CVA, transient ischemic attack.    PAST MEDICAL HISTORY:  1. Dementia. 2. Schizophrenia. 3. Mental retardation. 4. Hypertension. 5. Diabetes. 6. Iron deficiency anemia.  7. Gastroesophageal reflux disease.  8. Chronic obstructive pulmonary disease.  9. Diabetes.   10. Smoking.   PAST SURGICAL HISTORY: None.   ALLERGIES: No known drug allergies.   MEDICATIONS AT HOME:  1. Advair Diskus 250/50, 1 puff b.i.d.  2. Amlodipine 10 mg daily.  3. Aspirin 325 mg daily.  4. Debrox 6.5% 2  drops once a day as needed p.r.n. wax build up.  5. Diclofenac 75 mg 1 tablet 2 times a day. 6. Donepezil 10 mg half tablet 2 times a day for dementia. 7. Ferrous sulfate 325, 1 tablet 2 times a day.  8. Hydrochlorothiazide 25 mg daily.  9. Hydroxyzine 50 mg at bedtime. 10. Lisinopril 10 mg daily.  11. Lorazepam 0.5 mg b.i.d. p.r.n.  12. Metformin 500 mg b.i.d.  13. Milk of magnesia 40 mL once a day p.r.n. constipation.  14. MVI 1 tablet daily.  15. Mylanta 15 mL q.4 hours p.r.n.  16. Namenda 10 mg two times a day.  17. Omeprazole 20 mg daily. 18. Perphenazine 4 mg 3 times a day.  19. Potassium chloride 10 mEq daily.  20. Seroquel XR 600 mg daily.  21. Simvastatin 20 mg daily.  22. Tylenol 325 mg 1 tablet daily.  23. Ventolin HFA 90 mcg 2 puffs q.4-6 hours p.r.n. wheezing.   FAMILY HISTORY: Unobtainable. Patient's children have relinquished care to the group home.   SOCIAL HISTORY: She supposedly is divorced. There may be some confusion or belief by staff that the patient may have murdered her husband. I am not sure. I tried calling Barbie Haggis. He could not confirm this. He seemed to be confused about certain things. She was a heavy smoker until a couple of months ago. She is down to six cigarettes a day. She does not drink or use illicit drugs. She used to be a homemaker.   REVIEW OF SYSTEMS: Unobtainable secondary to patient's dementia and altered mental status.    PHYSICAL EXAMINATION:  VITAL SIGNS: Temperature 98, heart 71, respiratory rate 20, blood pressure 140/63 now currently 114/50, sating 97% on room air.   GENERAL: Patient is well developed, well nourished, high BMI.   HEENT: Pupils equal, reactive to light and accommodation. Extraocular movements intact. Anicteric sclerae. No difficult hearing Oropharynx clear.   NECK: No JVD. No thyromegaly. No lymphadenopathy. No carotid bruits.   LUNGS: Clear to auscultation. No adventitious breath sounds. No use of accessory  muscles. Resonant to percussion.   CARDIOVASCULAR: Regular rate and rhythm. Normal S1, S2. No murmurs, gallops, rubs appreciated. PMI not lateralized. No lower extremity edema. 2+ dorsalis pedis pulses.   BREASTS: Deferred.   ABDOMEN: Soft, nontender, nondistended. Positive bowel sounds.   GENITOURINARY: Deferred.   MUSCULOSKELETAL: Strength 5/5 in all extremities except left arm and left leg appeared to have decreased strength at 3/5.  SKIN: No rashes, lesions, induration.   LYMPH: No lymphadenopathy in cervical region.  NEUROLOGICAL: Cranial nerves II through XII intact. Sensation is intact. She has some mild dysarthria. No contractures. She has unsteady gait but negative Romberg. +2 reflexes in her patellar.   PSYCH: Confused at times. Poor judgment.   LABORATORY, DIAGNOSTIC, AND RADIOLOGICAL DATA: EKG shows normal sinus rhythm with heart rate 68. CT scan of the head shows no acute intracranial process. Glucose 119, BUN 16, creatinine 0.9, sodium 140, potassium 3.8, chloride 105, bicarbonate 26, anion gap 9, troponin less than 0.02. White count 7.1, hemoglobin 11.3, hematocrit 35.7, platelets 292, MCV 87, INR 0.9. Urinalysis shows WBC 1, no bacteria, epithelial cells none, nitrite negative, leukocyte esterase negative.   ASSESSMENT AND PLAN: This is an unfortunate 74 year old African American female history of dementia, diabetes, hypertension, schizophrenia, possible mental retardation presents with unsteady gait, slurred speech. 1. Possible transient ischemic attack versus cerebrovascular accident with symptoms of slurred speech, unsteady gait. Patient has some focal deficits with reduced weakness on the left side of her body unsteady gait. She has negative Romberg. CT scan shows no acute intracranial abnormality. Will get chest xray, MRI, carotids, and echocardiogram. I spoke to group home owner. She has not seen any cardiologist. We have missed the window for TPA as symptoms began at  12:00 p.m. Would continue aspirin 325. Get carotids, echo. Continue neuro checks q.2 hours. Will also get physical therapy and speech and language pathology and put her on dysphagia diet. In addition to aspirin will continue her home dose of statin and check lipid panel.  2. Malignant hypertension. Will allow for permissive hypertension. Will use p.r.n. labetalol.  3. Diabetes. Will continue sliding scale insulin. Check A1c and hold metformin in case patient needs IV contrast studies.  4. Schizophrenia, dementia. Will continue Ativan as she is has been fairly getting this regular and not want her to withdraw. Will continue Seroquel and Namenda and donepezil and home schizophrenia medicine, perphenazine.  5. Gastroesophageal reflux disease. Continue omeprazole.  6. Chronic anemia. On iron.  Will change to onece a day dosing to avoid constipation.  7. Hyperlipidemia. Continue statin.  8. Chronic obstructive pulmonary disease. Will continue home inhalers. She has no active issues right now. 9. Smoking. We counseled the patient which I am not sure if she was able to understand because she has periods of lucidity but I explained to her caregiver that she should try to avoid quitting cigarettes completely and we have started her on nicotine patch to hopefully facilitate this.  10. Deep vein thrombosis prophylaxis. Maintain with aspirin and Lovenox.  11. CODE STATUS as per  staff is FULL CODE.   Discussed case with patient, ER and caregiver as well as owner of the group home, Barbie Haggis at [phone].  TOTAL TIME SPENT ON ADMISSION: 60 minutes.   Thank you for allowing me to participate in the care of this patient.   ____________________________ Judeth Horn. Royden Purl, MD aaf:cms D: 07/09/2011 22:32:44 ET T: 07/10/2011 06:24:04 ET JOB#: [id number]  cc: Mike Craze A. Royden Purl, MD, <Dictator> Epimenio Foot. Gigi Gin, MD Joaquin Bend MD ELECTRONICALLY SIGNED 07/11/2011 23:53

## 2014-05-07 NOTE — Consult Note (Signed)
Came to see patient, not in room. Have however discussed with her son Remo Lipps today, and explained that she has PET-positive lung mass with adenopathy likely lung cancer, and have discussed further options (given h/o dementia and gen weakness) including pursuing biopsy for tissue diagnosis (if they want to pursue treatment) versus not pursuing invasive procedures and treatment. States he has to d/w his brother and make decision. In the meantime, she has been on aspirin which is just stopped. Plan therefore is to followup as outpatetient in a week or so and then make further plan based on their decision.   Electronic Signatures: Jonn Shingles (MD)  (Signed on 29-Jun-13 21:31)  Authored  Last Updated: 29-Jun-13 21:31 by Jonn Shingles (MD)

## 2014-05-07 NOTE — Discharge Summary (Signed)
PATIENT NAME:  Cheryl [name], SOUFFRANT MR#:  [id number] DATE OF BIRTH:  [birth year]  DATE OF ADMISSION:  07/09/2011 DATE OF DISCHARGE:  07/15/2011  DIAGNOSES: 1. Possible transient ischemic attack.  2. Hypokalemia.  3. Hyperlipidemia.  4. Malignant hypertension on presentation.  5. Diabetes. 6. Schizophrenia with dementia. 7. Gastroesophageal reflux disease. 8. Normocytic anemia. 9. Chronic obstructive pulmonary disease. 10. Left-sided lung mass suspicious for malignancy. 11. Smoking.   DISPOSITION: The patient is being discharged to her group home.   DIET: Low sodium, 1800 calorie ADA diet.   ACTIVITY: As tolerated.   FOLLOW-UP:  1. Follow-up with primary care physician, Dr. Kingsley Spittle, in 1 to 2 weeks after discharge.  2. Follow-up with Dr. Ma Hillock within one week of discharge.   DO NOT TAKE: Please do not take aspirin or diclofenac until after the lung biopsy.   DISCHARGE MEDICATIONS: 1. Perphenazine 4 mg t.i.d.  2. Seroquel XR 300 mg 2 tablets once a day at 4 p.m.  3. Simvastatin 20 mg at bedtime.  4. Ativan 0.5 mg once a day as needed for anxiety. 5. Hydroxyzine 50 mg 1 to 2 once a day at bedtime as needed.  6. Tylenol 325 mg once a day as needed for pain and fever. 7. Ventolin HFA two puffs q.4 to 6 hours p.r.n.  8. Mylanta 15 mL q.4 hours p.r.n. for indigestion. 9. Milk of Magnesia 40 mL once a day as needed for constipation  10. Multivitamin 1 tablet daily.  11. Omeprazole 20 mg daily.  12. Amlodipine 10 mg daily.  13. Aricept 10 mg 0.5 tabs b.i.d.  14. Namenda 10 mg b.i.d.  15. Ferrous Sulfate 325 mg b.i.d.  16. Advair 250/50 one puff b.i.d.  17. Metformin 500 mg b.i.d.   CONSULTATION: Oncology consultation with Dr. Ma Hillock   LABORATORY, DIAGNOSTIC, AND RADIOLOGICAL DATA: CT of the head showed no acute abnormalities.   CT of the chest showed left lower lobe pleural-based density concerning for malignancy.  MRI of the brain showed no acute stroke.  PET scan  showed hypermetabolic left pleural-based mass with mediastinal lymphadenopathy.   Carotid ultrasound showed no hemodynamically significant stenosis.  Echo no cardiac source of emboli. LVEF normal, more than 55%. Normal right ventricular systolic function.  CBC normal other than hemoglobin of 11.3, essentially normal CMP. VLDL 4, LDL 66, cholesterol 141, triglycerides 22, HDL 71. Cardiac enzymes negative. TSH normal.   HOSPITAL COURSE: The patient is a 74 year old female with past medical history of schizophrenia, dementia, hypertension, diabetes, iron deficiency anemia, gastroesophageal reflux disease, COPD with ongoing smoking who presented from a group home with slurred speech and unsteady gait. She was admitted with a diagnosis of possible TIA. Extensive work-up including CT of the head, MRI of the brain, carotid ultrasound, and echo were normal. Her serial cardiac enzymes and TSH were also normal. She was evaluated by PT during the hospitalization and was at her baseline. She had mild hypokalemia which was supplemented. She is on statin therapy for hyperlipidemia and her LDL is at goal. On presentation her blood pressure was elevated, however, during the hospitalization it was on the low side, therefore, she is only on Norvasc at present. Her diabetes remained well controlled. Her hemoglobin A1c is 6. Her chronic obstructive pulmonary disease also remained well controlled. On her chest x-ray the patient was found to have left-sided mass. Therefore, a CAT scan of the chest was done and the patient was found to have left lower lobe pleural-based mass. PET scan was  done which showed metabolic left-sided mass with mediastinal lymphadenopathy. The patient was on aspirin for possible TIA, which was placed on hold since 07/11/2011. Oncology consultation with Dr. Ma Hillock was obtained who contacted the patient's son, Mr. Hailly Fess, since the patient was not capable of making her own decisions. Dr. Ma Hillock  recommended discharging the patient home off any aspirin so that an outpatient biopsy can be arranged for the patient if her family desires further treatment and investigation. As per Dr. Ma Hillock, the patient should follow-up with him in his office within a week. At that time if the patient's family wishes further work-up and treatment he will arrange for lung biopsy and further management. These findings were also communicated to the patient's group home manager, Mr. Dia Sitter. The patient is being discharged to her group home in a stable condition.   TIME SPENT: 45 minutes.   ____________________________ Cherre Huger, MD sp:drc D: 07/15/2011 15:56:29 ET T: 07/16/2011 10:23:06 ET JOB#: [id number]  cc: Cherre Huger, MD, <Dictator> Ellamae Sia, MD Sandeep R. Ma Hillock, MD Cherre Huger MD ELECTRONICALLY SIGNED 07/16/2011 14:03

## 2014-05-07 NOTE — Consult Note (Signed)
PATIENT NAME:  Cheryl [name], Cheryl [name] MR#:  [id number] DATE OF BIRTH:  1940-08-15  DATE OF CONSULTATION:  07/10/2011  REFERRING PHYSICIAN:  Cherre Huger, MD   CONSULTING PHYSICIAN:  Cumi Sanagustin R. Ma Hillock, MD  REASON FOR CONSULTATION: Lung mass, history of smoking.   HISTORY OF PRESENT ILLNESS: The patient is a 74 year old African American female who has been admitted to the hospital on June 26th with complaints of slurred speech, unsteady gait, possible cerebrovascular accident/transient ischemic attack. The patient has past medical history significant for dementia and is a borderline poor historian, no family at bedside at this time. History is obtained from discussion with the patient and review of chart records. She has otherwise history of schizophrenia, mental retardation, hypertension, diabetes, iron deficiency anemia, gastroesophageal reflux disease, chronic obstructive pulmonary disease, diabetes and also states that she has smoked for many years but has now quit. She had MRI of the brain without contrast which did not show any space-occupying lesions. There were subcortical and deep white matter changes consistent with chronic ischemia and no acute abnormality. The patient also had chest x-ray on June 26th which showed a masslike density in the posterior left midlung. She then had CT scan of the chest which showed a pleural-based mass in the left lower lobe superior segment measuring up to 4.66 cm, raising the possibility of malignancy. She denies any chest pain or hemoptysis. She states that her appetite is good.   PAST MEDICAL/SURGICAL HISTORY: As in history of present illness.   FAMILY HISTORY: Noncontributory.   SOCIAL HISTORY: History of smoking for many years. She lives in a group home. Per chart record, no history of alcohol or illicit drug usage.   REVIEW OF SYSTEMS: The patient is poor historian. She denies any fevers or chills. She states that appetite is good. She denies any difficulty  swallowing or sore throat. She denies any cough, hemoptysis, or chest pain. She denies any abdominal pain, diarrhea. No dysuria or hematuria. She denies any obvious bleeding symptoms.   PHYSICAL EXAMINATION:  GENERAL: The patient is a moderately built and well nourished individual, sitting in recliner chair, alert and cooperative and answers simple questions appropriately.  VITAL SIGNS: Temperature 97.9, pulse 65, respiratory rate 20, blood pressure 122/76, 94% on room air.   HEENT: Normocephalic, atraumatic. Extraocular movements intact. No oral thrush.   NECK: Negative for lymphadenopathy.   CARDIOVASCULAR:  S1, S2, regular rate and rhythm.   LUNGS: Bilateral diminished breath sounds overall. No crepitations or rhonchi.   ABDOMEN: Soft, nontender. No hepatosplenomegaly clinically.   EXTREMITIES: Trace edema.   NEUROLOGICAL:  Moves all extremities spontaneously. Cranial nerves seem intact.   SKIN: No generalized rashes.   LABORATORY DATA:  Hemoglobin 11.4, platelets 295, WBC 65 with unremarkable differential. Creatinine 0.92, potassium 3.4, calcium 8.5.   IMPRESSION/PLAN: A 74 year old female patient with past history of smoking, multiple medical problems as described above, admitted with possible transient ischemic attack/CVA. MRI brain without contrast shows chronic ischemic changes and no acute changes, no space-occupying lesions. Chest x-ray was abnormal, and CT scan of the chest does show a 4.66 cm pleural-based mass in the left lower lobe superior segment of the lung raising suspicion of malignancy. No family is present at bedside. The patient is a poor historian with history of dementia. Per hospitalist, they have discussed with the patient's son, who wants further work-up for lung mass and PET scan has already been ordered. Will follow up after PET scan is done and then discuss with the patient  and family and make further recommendation and plan of management. The patient otherwise  does not seem to have acute pain issues at this time.   Thank you for the referral. Please feel free to contact me if any additional questions.   ____________________________ Rhett Bannister Ma Hillock, MD srp:cbb D: 07/11/2011 00:08:46 ET T: 07/11/2011 06:13:01 ET JOB#: [id number] Jerian Morais R Jeriah Corkum MD ELECTRONICALLY SIGNED 07/11/2011 7:59

## 2014-08-22 ENCOUNTER — Inpatient Hospital Stay: Payer: Medicare Other | Attending: Internal Medicine

## 2014-08-22 ENCOUNTER — Other Ambulatory Visit: Payer: Self-pay | Admitting: *Deleted

## 2014-08-22 ENCOUNTER — Ambulatory Visit
Admission: RE | Admit: 2014-08-22 | Discharge: 2014-08-22 | Disposition: A | Discharge status: Home / Self Care | Payer: Medicare Other | Source: Ambulatory Visit | Attending: Internal Medicine | Admitting: Internal Medicine

## 2014-08-22 DIAGNOSIS — J449 Chronic obstructive pulmonary disease, unspecified: Secondary | ICD-10-CM | POA: Diagnosis not present

## 2014-08-22 DIAGNOSIS — E119 Type 2 diabetes mellitus without complications: Secondary | ICD-10-CM | POA: Diagnosis not present

## 2014-08-22 DIAGNOSIS — K769 Liver disease, unspecified: Secondary | ICD-10-CM | POA: Insufficient documentation

## 2014-08-22 DIAGNOSIS — Z85118 Personal history of other malignant neoplasm of bronchus and lung: Secondary | ICD-10-CM | POA: Insufficient documentation

## 2014-08-22 DIAGNOSIS — F209 Schizophrenia, unspecified: Secondary | ICD-10-CM | POA: Diagnosis not present

## 2014-08-22 DIAGNOSIS — Z9221 Personal history of antineoplastic chemotherapy: Secondary | ICD-10-CM | POA: Diagnosis present

## 2014-08-22 DIAGNOSIS — C3411 Malignant neoplasm of upper lobe, right bronchus or lung: Secondary | ICD-10-CM | POA: Diagnosis present

## 2014-08-22 DIAGNOSIS — F0390 Unspecified dementia without behavioral disturbance: Secondary | ICD-10-CM | POA: Diagnosis not present

## 2014-08-22 DIAGNOSIS — R918 Other nonspecific abnormal finding of lung field: Secondary | ICD-10-CM | POA: Insufficient documentation

## 2014-08-22 DIAGNOSIS — I10 Essential (primary) hypertension: Secondary | ICD-10-CM | POA: Diagnosis not present

## 2014-08-22 DIAGNOSIS — K449 Diaphragmatic hernia without obstruction or gangrene: Secondary | ICD-10-CM | POA: Diagnosis not present

## 2014-08-22 DIAGNOSIS — Z08 Encounter for follow-up examination after completed treatment for malignant neoplasm: Secondary | ICD-10-CM | POA: Diagnosis not present

## 2014-08-22 DIAGNOSIS — I7090 Unspecified atherosclerosis: Secondary | ICD-10-CM | POA: Diagnosis not present

## 2014-08-22 DIAGNOSIS — K219 Gastro-esophageal reflux disease without esophagitis: Secondary | ICD-10-CM | POA: Insufficient documentation

## 2014-08-22 DIAGNOSIS — F17210 Nicotine dependence, cigarettes, uncomplicated: Secondary | ICD-10-CM | POA: Diagnosis not present

## 2014-08-22 DIAGNOSIS — E0789 Other specified disorders of thyroid: Secondary | ICD-10-CM | POA: Diagnosis not present

## 2014-08-22 DIAGNOSIS — F79 Unspecified intellectual disabilities: Secondary | ICD-10-CM | POA: Diagnosis not present

## 2014-08-22 DIAGNOSIS — Z923 Personal history of irradiation: Secondary | ICD-10-CM | POA: Diagnosis not present

## 2014-08-22 DIAGNOSIS — C3492 Malignant neoplasm of unspecified part of left bronchus or lung: Secondary | ICD-10-CM

## 2014-08-22 HISTORY — DX: Essential (primary) hypertension: I10

## 2014-08-22 LAB — CBC WITH DIFFERENTIAL/PLATELET
Basophils Absolute: 0 10*3/uL (ref 0–0.1)
Basophils Relative: 1 %
EOS ABS: 0.3 10*3/uL (ref 0–0.7)
EOS PCT: 6 %
HCT: 38 % (ref 35.0–47.0)
Hemoglobin: 12.5 g/dL (ref 12.0–16.0)
LYMPHS ABS: 1.2 10*3/uL (ref 1.0–3.6)
Lymphocytes Relative: 28 %
MCH: 27.8 pg (ref 26.0–34.0)
MCHC: 32.8 g/dL (ref 32.0–36.0)
MCV: 84.8 fL (ref 80.0–100.0)
MONO ABS: 0.4 10*3/uL (ref 0.2–0.9)
MONOS PCT: 10 %
NEUTROS ABS: 2.5 10*3/uL (ref 1.4–6.5)
NEUTROS PCT: 55 %
PLATELETS: 288 10*3/uL (ref 150–440)
RBC: 4.48 MIL/uL (ref 3.80–5.20)
RDW: 14.4 % (ref 11.5–14.5)
WBC: 4.5 10*3/uL (ref 3.6–11.0)

## 2014-08-22 LAB — COMPREHENSIVE METABOLIC PANEL
ALT: 15 U/L (ref 14–54)
AST: 22 U/L (ref 15–41)
Albumin: 4.1 g/dL (ref 3.5–5.0)
Alkaline Phosphatase: 73 U/L (ref 38–126)
Anion gap: 7 (ref 5–15)
BUN: 15 mg/dL (ref 6–20)
CO2: 28 mmol/L (ref 22–32)
CREATININE: 0.9 mg/dL (ref 0.44–1.00)
Calcium: 9 mg/dL (ref 8.9–10.3)
Chloride: 104 mmol/L (ref 101–111)
GFR calc Af Amer: >60 mL/min (ref >60)
GFR calc non Af Amer: >60 mL/min (ref >60)
GLUCOSE: 75 mg/dL (ref 65–99)
POTASSIUM: 3.7 mmol/L (ref 3.5–5.1)
Sodium: 139 mmol/L (ref 135–145)
Total Bilirubin: 0.3 mg/dL (ref 0.3–1.2)
Total Protein: 7.5 g/dL (ref 6.5–8.1)

## 2014-08-22 MED ORDER — IOHEXOL 300 MG/ML  SOLN
75.0000 mL | Freq: Once | INTRAMUSCULAR | Status: AC | PRN
  Administered 2014-08-22: 75 mL via INTRAVENOUS

## 2014-08-23 ENCOUNTER — Encounter: Payer: Self-pay | Admitting: Internal Medicine

## 2014-08-23 ENCOUNTER — Inpatient Hospital Stay (HOSPITAL_BASED_OUTPATIENT_CLINIC_OR_DEPARTMENT_OTHER): Payer: Medicare Other | Admitting: Internal Medicine

## 2014-08-23 VITALS — Temp 96.9°F | Wt 185.8 lb

## 2014-08-23 DIAGNOSIS — Z923 Personal history of irradiation: Secondary | ICD-10-CM

## 2014-08-23 DIAGNOSIS — Z85118 Personal history of other malignant neoplasm of bronchus and lung: Secondary | ICD-10-CM | POA: Diagnosis not present

## 2014-08-23 DIAGNOSIS — C3490 Malignant neoplasm of unspecified part of unspecified bronchus or lung: Secondary | ICD-10-CM

## 2014-08-23 NOTE — Progress Notes (Deleted)
The patient has a history of mental retardation.  Her caregiver is present with her in clinic.

## 2014-09-04 NOTE — Progress Notes (Signed)
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 CT-guided biopsy 07/25/2011 confirmed NON-SMALL CELL CARCINOMA, FAVOR ADENOCARCINOMA. Given comorbidities and poor performance status (ECOG 3), patient completed single modality radiation on 09/09/11.   HISTORY OF PRESENT ILLNESS:  Patient returns for continued oncology followup, she was last seen in December 2015. She has history of lung cancer as described above. States that she is doing fairly steady, she is accompanied by caregiver who states that chronic fatigue and dyspnea on exertion are same. She does not do much physical activity. No new bone pains. Denies any new cough, hemoptysis, or chest pain. Appetite is good and denies unintentional weight loss. Denies any headaches, imbalance or falls.   REVIEW OF SYSTEMS:   ROS As in HPI above. In addition, no fevers or sweats. No new headaches or focal weakness.  No sore throat or dysphagia. Denies new dizziness or palpitation. No abdominal pain, constipation, diarrhea, dysuria or hematuria. No new skin rash or bleeding symptoms. No new paresthesias in extremities. No polyuria polydipsia.   PAST MEDICAL HISTORY: Reviewed. Past Medical History  Diagnosis Date  . Hypertension   . Lung cancer   . Mental retardation   . GERD (gastroesophageal reflux disease)   . COPD (chronic obstructive pulmonary disease)   . Diabetes mellitus without complication   . Dementia   . Schizophrenia     PAST SURGICAL HISTORY: Reviewed. History reviewed. No pertinent past surgical history.  FAMILY HISTORY: Reviewed. History reviewed. No pertinent family history.  SOCIAL HISTORY:  Reviewed. Social History  Substance Use Topics  . Smoking status: Current Some Day Smoker -- 0.25 packs/day    Types: Cigarettes  . Smokeless tobacco: None  . Alcohol Use: No    No Known Allergies  Current Outpatient Prescriptions  Medication Sig Dispense Refill  . acetaminophen (TYLENOL) 325 MG tablet Take by mouth.    . Albuterol Sulfate 108 (90 BASE) MCG/ACT AEPB Inhale into the lungs.    . ALPRAZolam (XANAX) 0.25 MG tablet Take by mouth.    Marland Kitchen aluminum-magnesium hydroxide-simethicone (MAALOX) 998-338-25 MG/5ML SUSP Take by mouth.    Marland Kitchen amLODipine (NORVASC) 10 MG tablet Take by mouth.    Marland Kitchen aspirin 325 MG tablet Take by mouth.    . Dextromethorphan-Quinidine (NUEDEXTA) 20-10 MG CAPS Take 1 capsule by mouth 2 (two) times daily.    . ferrous sulfate 325 (65 FE) MG tablet Take by mouth.    . Fluticasone-Salmeterol (ADVAIR DISKUS) 250-50 MCG/DOSE AEPB Inhale into the lungs.    Marland Kitchen lisinopril (PRINIVIL,ZESTRIL) 10 MG tablet Take by mouth.    . magnesium hydroxide (MILK OF MAGNESIA) 400 MG/5ML suspension Take by mouth.    . memantine (NAMENDA XR) 28 MG CP24 24 hr capsule Take by mouth.    . metFORMIN (GLUCOPHAGE) 500 MG tablet Take by mouth.    . modafinil (PROVIGIL) 100 MG tablet Take by mouth.    . Multiple Vitamin (MULTI-VITAMINS) TABS Take by mouth.    Marland Kitchen omeprazole (PRILOSEC) 40 MG capsule Take by mouth.    . perphenazine (TRILAFON) 2 MG tablet Take by mouth.    . perphenazine (TRILAFON) 4 MG tablet Take by mouth.    . QUEtiapine (SEROQUEL XR) 400 MG 24  hr tablet Take by mouth.    . simvastatin (ZOCOR) 20 MG tablet Take by mouth.    . Vitamin D, Ergocalciferol, (DRISDOL) 50000 UNITS CAPS capsule Take by mouth.     No current facility-administered medications for this visit.    PHYSICAL EXAM: Filed Vitals:   08/23/14 1140  Temp: 96.9 F (36.1 C)     There is no height on file to calculate BMI.    ECOG FS:2 - Symptomatic, <50% confined to bed  GENERAL: Patient is alert and  oriented and in no acute distress. There is no icterus. HEENT: EOMs intact. Oral exam negative for thrush or lesions. No cervical lymphadenopathy. CVS: S1S2, regular LUNGS: Bilaterally clear to auscultation, no rhonchi. ABDOMEN: Soft, nontender. No hepatosplenomegaly clinically.  EXTREMITIES: No pedal edema.  LAB RESULTS:    Component Value Date/Time   NA 139 08/22/2014 1101   NA 144 12/15/2013 1405   K 3.7 08/22/2014 1101   K 3.8 12/15/2013 1405   CL 104 08/22/2014 1101   CL 106 12/15/2013 1405   CO2 28 08/22/2014 1101   CO2 30 12/15/2013 1405   GLUCOSE 75 08/22/2014 1101   GLUCOSE 183* 12/15/2013 1405   BUN 15 08/22/2014 1101   BUN 19* 12/15/2013 1405   CREATININE 0.90 08/22/2014 1101   CREATININE 1.04 12/15/2013 1405   CALCIUM 9.0 08/22/2014 1101   CALCIUM 9.1 12/15/2013 1405   PROT 7.5 08/22/2014 1101   PROT 7.5 12/15/2013 1405   ALBUMIN 4.1 08/22/2014 1101   ALBUMIN 3.7 12/15/2013 1405   AST 22 08/22/2014 1101   AST 14* 12/15/2013 1405   ALT 15 08/22/2014 1101   ALT 23 12/15/2013 1405   ALKPHOS 73 08/22/2014 1101   ALKPHOS 94 12/15/2013 1405   BILITOT 0.3 08/22/2014 1101   BILITOT 0.1* 12/15/2013 1405   GFRNONAA >60 08/22/2014 1101   GFRNONAA 55* 12/15/2013 1405   GFRNONAA 55* 08/19/2013 1445   GFRAA >60 08/22/2014 1101   GFRAA >60 12/15/2013 1405   GFRAA >60 08/19/2013 1445    Lab Results  Component Value Date   WBC 4.5 08/22/2014   NEUTROABS 2.5 08/22/2014   HGB 12.5 08/22/2014   HCT 38.0 08/22/2014   MCV 84.8 08/22/2014   PLT 288 08/22/2014    STUDIES: April 2015 - CT chest. IMPRESSION: Radiation changes involving the left long but no findings for residual or recurrent tumor in the left lower lobe. No findings for metastatic pulmonary or mediastinal disease.  12/15/13 - CT chest. IMPRESSION:  1. Continued left perihilar radiation fibrosis and scarring, without recurrence identified.  2. Moderate-sized hiatal hernia.  Possible adjacent esophagitis.  3.  Hypodense liver and thyroid lesions, likely benign. None of these were hypermetabolic on prior PET-CT.  4. Trace right pleural effusion along the posterior costophrenic angle.  5. Coronary artery and aortic atherosclerotic calcification.  08/22/14 -  Ct Chest W Contrast  08/22/2014   CLINICAL DATA:  Lung cancer diagnosed 3 years ago post chemotherapy and radiation therapy. Subsequent encounter.  EXAM: CT CHEST WITH CONTRAST  TECHNIQUE: Multidetector CT imaging of the chest was performed during intravenous contrast administration.  CONTRAST:  69m OMNIPAQUE IOHEXOL 300 MG/ML  SOLN  COMPARISON:  CTs 12/15/2013 and 04/13/2013.  FINDINGS: Mediastinum/Nodes: There are no enlarged mediastinal, hilar or axillary lymph nodes. Stable thyroid nodularity and moderate size hiatal hernia. The heart size is normal. There is no pericardial effusion. Central enlargement of the pulmonary arteries is noted. There is stable atherosclerosis of the aorta, great  vessels and coronary arteries.  Lungs/Pleura: No significant pleural effusion. Chronic pleural thickening posteriorly in the mid left hemithorax unchanged. Stable volume loss in the left hemithorax with left perihilar radiation changes. No evidence of local recurrence or metastatic disease. The right lung is clear. Mild emphysematous changes are present.  Upper abdomen: Stable low-density hepatic lesions. No new or enlarging lesions. No adrenal mass.  Musculoskeletal/Chest wall: Stable sclerotic lesion within the T6 vertebral body, likely a bone island. There is mild marrow heterogeneity without lytic lesion or pathologic fracture. Posterior subcutaneous lipoma noted in the upper left back, measuring 8.5 cm transverse on image 1.  IMPRESSION: 1. Stable radiation changes in the left hemithorax. No evidence of local recurrence or metastatic disease. 2. Stable incidental findings including thyroid nodularity, a moderate size hiatal hernia, atherosclerosis and low-density hepatic  lesions.   Electronically Signed   By: Richardean Sale M.D.   On: 08/22/2014 14:04     ASSESSMENT / PLAN:   1.  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 CT-guided biopsy 07/25/2011 confirmed NON-SMALL CELL CARCINOMA, FAVOR ADENOCARCINOMA. Completed radiation therapy on September 09, 2011 -   reviewed labs and also CT scan of the chest from 08/22/14 and discussed with patient and caregiver present. Clinically seems to be doing fairly steady without any new symptoms or signs to suggest recurrent or metastatic lung cancer. She has completed radiation therapy in August 2013. No pain issues, hemoptysis, or chest pain. Repeat CT scan of the chest done yesterday continues to report stable radiation changes in the left hemithorax with no evidence of local recurrence or metastatic disease, stable incidental findings including thyroid nodularity, moderate sized hiatal hernia, atherosclerosis and low-density hepatic lesions. Given this, plan is continued surveillance. Will see her back at 24 weeks with labs and then plan continued radiological surveillance as indicated.  2. In between visits, patient advised to call or come to ER in case of any new symptoms or acute sickness. She is agreeable to this plan.     Leia Alf, MD   09/04/2014 1:25 PM

## 2014-10-11 ENCOUNTER — Other Ambulatory Visit: Payer: Self-pay | Admitting: Family Medicine

## 2014-10-11 DIAGNOSIS — N939 Abnormal uterine and vaginal bleeding, unspecified: Secondary | ICD-10-CM

## 2014-10-17 ENCOUNTER — Ambulatory Visit
Admission: RE | Admit: 2014-10-17 | Discharge: 2014-10-17 | Disposition: A | Discharge status: Home / Self Care | Payer: Medicare Other | Source: Ambulatory Visit | Attending: Family Medicine | Admitting: Family Medicine

## 2014-10-17 DIAGNOSIS — N939 Abnormal uterine and vaginal bleeding, unspecified: Secondary | ICD-10-CM

## 2014-10-17 DIAGNOSIS — Z90710 Acquired absence of both cervix and uterus: Secondary | ICD-10-CM | POA: Diagnosis not present

## 2015-02-06 ENCOUNTER — Other Ambulatory Visit: Payer: Self-pay | Admitting: *Deleted

## 2015-02-06 DIAGNOSIS — C3492 Malignant neoplasm of unspecified part of left bronchus or lung: Secondary | ICD-10-CM

## 2015-02-07 ENCOUNTER — Encounter: Payer: Self-pay | Admitting: Internal Medicine

## 2015-02-07 ENCOUNTER — Ambulatory Visit: Payer: Medicare Other | Admitting: Internal Medicine

## 2015-02-07 ENCOUNTER — Inpatient Hospital Stay: Payer: Medicare Other

## 2015-02-07 ENCOUNTER — Inpatient Hospital Stay: Payer: Medicare Other | Attending: Internal Medicine | Admitting: Internal Medicine

## 2015-02-07 VITALS — BP 132/76 | HR 60 | Temp 98.0°F | Resp 18 | Ht 62.0 in | Wt 185.6 lb

## 2015-02-07 DIAGNOSIS — E119 Type 2 diabetes mellitus without complications: Secondary | ICD-10-CM | POA: Insufficient documentation

## 2015-02-07 DIAGNOSIS — Z923 Personal history of irradiation: Secondary | ICD-10-CM | POA: Diagnosis not present

## 2015-02-07 DIAGNOSIS — Z7984 Long term (current) use of oral hypoglycemic drugs: Secondary | ICD-10-CM | POA: Diagnosis not present

## 2015-02-07 DIAGNOSIS — C3492 Malignant neoplasm of unspecified part of left bronchus or lung: Secondary | ICD-10-CM | POA: Insufficient documentation

## 2015-02-07 DIAGNOSIS — F209 Schizophrenia, unspecified: Secondary | ICD-10-CM | POA: Diagnosis not present

## 2015-02-07 DIAGNOSIS — F17210 Nicotine dependence, cigarettes, uncomplicated: Secondary | ICD-10-CM | POA: Insufficient documentation

## 2015-02-07 DIAGNOSIS — K219 Gastro-esophageal reflux disease without esophagitis: Secondary | ICD-10-CM | POA: Insufficient documentation

## 2015-02-07 DIAGNOSIS — J449 Chronic obstructive pulmonary disease, unspecified: Secondary | ICD-10-CM | POA: Insufficient documentation

## 2015-02-07 DIAGNOSIS — I10 Essential (primary) hypertension: Secondary | ICD-10-CM | POA: Diagnosis not present

## 2015-02-07 DIAGNOSIS — Z7982 Long term (current) use of aspirin: Secondary | ICD-10-CM | POA: Diagnosis not present

## 2015-02-07 DIAGNOSIS — Z79899 Other long term (current) drug therapy: Secondary | ICD-10-CM | POA: Diagnosis not present

## 2015-02-07 DIAGNOSIS — E876 Hypokalemia: Secondary | ICD-10-CM | POA: Insufficient documentation

## 2015-02-07 DIAGNOSIS — F0390 Unspecified dementia without behavioral disturbance: Secondary | ICD-10-CM | POA: Insufficient documentation

## 2015-02-07 DIAGNOSIS — F79 Unspecified intellectual disabilities: Secondary | ICD-10-CM | POA: Diagnosis not present

## 2015-02-07 LAB — CBC WITH DIFFERENTIAL/PLATELET
BASOS ABS: 0 10*3/uL (ref 0–0.1)
Basophils Relative: 1 %
EOS ABS: 0.2 10*3/uL (ref 0–0.7)
EOS PCT: 3 %
HCT: 38.3 % (ref 35.0–47.0)
HEMOGLOBIN: 12.7 g/dL (ref 12.0–16.0)
LYMPHS PCT: 30 %
Lymphs Abs: 1.8 10*3/uL (ref 1.0–3.6)
MCH: 28 pg (ref 26.0–34.0)
MCHC: 33.2 g/dL (ref 32.0–36.0)
MCV: 84.1 fL (ref 80.0–100.0)
Monocytes Absolute: 0.5 10*3/uL (ref 0.2–0.9)
Monocytes Relative: 9 %
NEUTROS PCT: 57 %
Neutro Abs: 3.4 10*3/uL (ref 1.4–6.5)
PLATELETS: 311 10*3/uL (ref 150–440)
RBC: 4.56 MIL/uL (ref 3.80–5.20)
RDW: 13.9 % (ref 11.5–14.5)
WBC: 5.8 10*3/uL (ref 3.6–11.0)

## 2015-02-07 LAB — COMPREHENSIVE METABOLIC PANEL
ALT: 16 U/L (ref 14–54)
AST: 20 U/L (ref 15–41)
Albumin: 4.3 g/dL (ref 3.5–5.0)
Alkaline Phosphatase: 71 U/L (ref 38–126)
Anion gap: 6 (ref 5–15)
BUN: 14 mg/dL (ref 6–20)
CHLORIDE: 104 mmol/L (ref 101–111)
CO2: 26 mmol/L (ref 22–32)
CREATININE: 0.84 mg/dL (ref 0.44–1.00)
Calcium: 9.2 mg/dL (ref 8.9–10.3)
GFR calc Af Amer: >60 mL/min (ref >60)
GFR calc non Af Amer: >60 mL/min (ref >60)
GLUCOSE: 103 mg/dL — AB (ref 65–99)
Potassium: 3.4 mmol/L — ABNORMAL LOW (ref 3.5–5.1)
SODIUM: 136 mmol/L (ref 135–145)
Total Bilirubin: 0.3 mg/dL (ref 0.3–1.2)
Total Protein: 7.6 g/dL (ref 6.5–8.1)

## 2015-02-07 MED ORDER — POTASSIUM CHLORIDE CRYS ER 20 MEQ PO TBCR: 20.0000 meq | EXTENDED_RELEASE_TABLET | Freq: Every day | ORAL | Status: DC

## 2015-02-07 NOTE — Progress Notes (Signed)
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 CT-guided biopsy 07/25/2011 confirmed NON-SMALL CELL CARCINOMA, FAVOR ADENOCARCINOMA. Given comorbidities and poor performance status (ECOG 3), patient completed single modality radiation on 09/09/11.   HISTORY OF PRESENT ILLNESS:  Cheryl [name] returns to our clinic for a follow-up visit. She has done reasonably well since her last appointment. She continues to reside in an assisted living facility. She continues to smoke about half a pack of cigarettes a day. She continues to have mild shortness of breath with wheezes, which prompted her to use inhaler. However, there is no weight loss, cough, hemoptysis.  REVIEW OF SYSTEMS:   ROS As in HPI above. In addition, no fevers or sweats. No new headaches or focal weakness.  No sore throat or dysphagia. Denies new dizziness or palpitation. No abdominal pain, constipation, diarrhea, dysuria or hematuria. No new skin rash or bleeding symptoms. No new paresthesias in extremities. No polyuria polydipsia.   PAST MEDICAL HISTORY: Reviewed. Past Medical History  Diagnosis Date  . Hypertension   . Lung cancer   . Mental retardation   . GERD (gastroesophageal reflux disease)   . COPD (chronic obstructive pulmonary disease)   . Diabetes mellitus without complication   . Dementia   . Schizophrenia     PAST SURGICAL HISTORY: Reviewed. No past surgical history on file.  FAMILY HISTORY: Reviewed. No family history on file.  SOCIAL HISTORY: Reviewed. Social History  Substance Use Topics  . Smoking status: Current Some Day Smoker -- 0.25 packs/day    Types: Cigarettes  . Smokeless  tobacco: Not on file  . Alcohol Use: No    No Known Allergies  Current Outpatient Prescriptions  Medication Sig Dispense Refill  . acetaminophen (TYLENOL) 325 MG tablet Take by mouth.    . Albuterol Sulfate 108 (90 BASE) MCG/ACT AEPB Inhale into the lungs.    . ALPRAZolam (XANAX) 0.25 MG tablet Take by mouth.    Marland Kitchen aluminum-magnesium hydroxide-simethicone (MAALOX) 845-364-68 MG/5ML SUSP Take by mouth.    Marland Kitchen amLODipine (NORVASC) 10 MG tablet Take by mouth.    Marland Kitchen aspirin 325 MG tablet Take by mouth.    . Dextromethorphan-Quinidine (NUEDEXTA) 20-10 MG CAPS Take 1 capsule by mouth daily.     . ferrous sulfate 325 (65 FE) MG tablet Take by mouth 2 (two) times daily with a meal.     . Fluticasone-Salmeterol (ADVAIR) 250-50 MCG/DOSE AEPB Inhale 1 puff into the lungs 2 (two) times daily.    Marland Kitchen lisinopril (PRINIVIL,ZESTRIL) 10 MG tablet Take by mouth.    . magnesium hydroxide (MILK OF MAGNESIA) 400 MG/5ML suspension Take by mouth.    . Memantine HCl-Donepezil HCl (NAMZARIC) 28-10 MG CP24 Take 1 capsule by mouth daily.    . metFORMIN (GLUCOPHAGE) 500 MG tablet Take 500 mg by mouth 2 (two) times daily with a meal.     . modafinil (PROVIGIL) 100 MG tablet Take by mouth.    . Multiple Vitamin (MULTI-VITAMINS) TABS Take by mouth.    Marland Kitchen omeprazole (PRILOSEC) 40 MG capsule Take by mouth.    . perphenazine (TRILAFON) 2 MG tablet Take by mouth.    . perphenazine (TRILAFON) 4 MG tablet Take by mouth.    Marland Kitchen  QUEtiapine (SEROQUEL XR) 400 MG 24 hr tablet Take 300 mg by mouth.     . simvastatin (ZOCOR) 20 MG tablet Take by mouth.    . Vitamin D, Ergocalciferol, (DRISDOL) 50000 UNITS CAPS capsule Take by mouth.    . potassium chloride SA (K-DUR,KLOR-CON) 20 MEQ tablet Take 1 tablet (20 mEq total) by mouth daily. 14 tablet 0   No current facility-administered medications for this visit.    PHYSICAL EXAM: Filed Vitals:   02/07/15 1152  BP: 132/76  Pulse: 60  Temp: 98 F (36.7 C)  Resp: 18     Body mass  index is 33.94 kg/(m^2).    ECOG FS:2 - Symptomatic, <50% confined to bed  GENERAL: Patient is an elderly obese African-American female, alert and oriented and in no acute distress. There is no icterus. HEENT: EOMs intact. Oral exam negative for thrush or lesions. No cervical lymphadenopathy. CVS: S1S2, regular LUNGS: Good air entry bilaterally, minimal wheezing on the left side. ABDOMEN: Soft, nontender. No hepatosplenomegaly clinically.  EXTREMITIES: No pedal edema. Neurologic exam: Grossly nonfocal.  LAB RESULTS:    Component Value Date/Time   NA 139 08/22/2014 1101   NA 144 12/15/2013 1405   K 3.7 08/22/2014 1101   K 3.8 12/15/2013 1405   CL 104 08/22/2014 1101   CL 106 12/15/2013 1405   CO2 28 08/22/2014 1101   CO2 30 12/15/2013 1405   GLUCOSE 75 08/22/2014 1101   GLUCOSE 183* 12/15/2013 1405   BUN 15 08/22/2014 1101   BUN 19* 12/15/2013 1405   CREATININE 0.90 08/22/2014 1101   CREATININE 1.04 12/15/2013 1405   CALCIUM 9.0 08/22/2014 1101   CALCIUM 9.1 12/15/2013 1405   PROT 7.5 08/22/2014 1101   PROT 7.5 12/15/2013 1405   ALBUMIN 4.1 08/22/2014 1101   ALBUMIN 3.7 12/15/2013 1405   AST 22 08/22/2014 1101   AST 14* 12/15/2013 1405   ALT 15 08/22/2014 1101   ALT 23 12/15/2013 1405   ALKPHOS 73 08/22/2014 1101   ALKPHOS 94 12/15/2013 1405   BILITOT 0.3 08/22/2014 1101   BILITOT 0.1* 12/15/2013 1405   GFRNONAA >60 08/22/2014 1101   GFRNONAA 55* 12/15/2013 1405   GFRNONAA 55* 08/19/2013 1445   GFRAA >60 08/22/2014 1101   GFRAA >60 12/15/2013 1405   GFRAA >60 08/19/2013 1445   is in her  Lab Results  Component Value Date   WBC 4.5 08/22/2014   NEUTROABS 2.5 08/22/2014   HGB 12.5 08/22/2014   HCT 38.0 08/22/2014   MCV 84.8 08/22/2014   PLT 288 08/22/2014    STUDIES: April 2015 - CT chest. IMPRESSION: Radiation changes involving the left long but no findings for residual or recurrent tumor in the left lower lobe. No findings for metastatic pulmonary or  mediastinal disease.  12/15/13 - CT chest. IMPRESSION:  1. Continued left perihilar radiation fibrosis and scarring, without recurrence identified.  2. Moderate-sized hiatal hernia.  Possible adjacent esophagitis.  3. Hypodense liver and thyroid lesions, likely benign. None of these were hypermetabolic on prior PET-CT.  4. Trace right pleural effusion along the posterior costophrenic angle.  5. Coronary artery and aortic atherosclerotic calcification.  08/22/14 -  No results found.   ASSESSMENT / PLAN:   1.  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 CT-guided biopsy 07/25/2011 confirmed NON-SMALL CELL CARCINOMA, FAVOR ADENOCARCINOMA. Completed radiation therapy on September 09, 2011 -  CT scan of the chest from 08/22/14 showed no  evidence of disease.  Clinically Cheryl [name] continues to show no evidence of recurrent disease. She will have another CT of the chest with contrast in August 2017, and will return to our clinic for a follow-up appointment after that. 2. Hypokalemia-will provide oral potassium supplementation for 1 week. 3. In between visits, patient advised to call or come to ER in case of any new symptoms or acute sickness. She is agreeable to this plan.     Roxana Hires, MD   02/07/2015 11:47 AM

## 2015-02-07 NOTE — Progress Notes (Signed)
Caregiver from [facility] states over last month pt been using inhaler more often. Pt states she has no cough, no sputum production. Breathes ok. Still smokes. Eat and drinking good, good BM

## 2015-02-22 ENCOUNTER — Telehealth: Payer: Self-pay | Admitting: *Deleted

## 2015-02-22 NOTE — Telephone Encounter (Signed)
A rx was sent over to them on 02/07/15, they are a long term care pharmacy and  is in a group home, The rx was for potassium and only # 14 tabs were ordered with no refills. They either need refills for the current rx or a discontinuation order. Please advise

## 2015-02-22 NOTE — Telephone Encounter (Signed)
Per Dr Rudean Hitt, discontinue Potassium. Call returned to [place] at [facility]

## 2015-09-05 ENCOUNTER — Ambulatory Visit
Admission: RE | Admit: 2015-09-05 | Discharge: 2015-09-05 | Disposition: A | Discharge status: Home / Self Care | Payer: Medicare Other | Source: Ambulatory Visit | Attending: Internal Medicine | Admitting: Internal Medicine

## 2015-09-05 ENCOUNTER — Inpatient Hospital Stay: Payer: Medicare Other | Attending: Hematology and Oncology

## 2015-09-05 DIAGNOSIS — Z85118 Personal history of other malignant neoplasm of bronchus and lung: Secondary | ICD-10-CM | POA: Insufficient documentation

## 2015-09-05 DIAGNOSIS — Z923 Personal history of irradiation: Secondary | ICD-10-CM | POA: Insufficient documentation

## 2015-09-05 DIAGNOSIS — E119 Type 2 diabetes mellitus without complications: Secondary | ICD-10-CM | POA: Insufficient documentation

## 2015-09-05 DIAGNOSIS — Z7984 Long term (current) use of oral hypoglycemic drugs: Secondary | ICD-10-CM | POA: Insufficient documentation

## 2015-09-05 DIAGNOSIS — F0390 Unspecified dementia without behavioral disturbance: Secondary | ICD-10-CM | POA: Insufficient documentation

## 2015-09-05 DIAGNOSIS — J449 Chronic obstructive pulmonary disease, unspecified: Secondary | ICD-10-CM | POA: Insufficient documentation

## 2015-09-05 DIAGNOSIS — C3492 Malignant neoplasm of unspecified part of left bronchus or lung: Secondary | ICD-10-CM | POA: Diagnosis present

## 2015-09-05 DIAGNOSIS — F79 Unspecified intellectual disabilities: Secondary | ICD-10-CM | POA: Insufficient documentation

## 2015-09-05 DIAGNOSIS — E785 Hyperlipidemia, unspecified: Secondary | ICD-10-CM | POA: Insufficient documentation

## 2015-09-05 DIAGNOSIS — Z7982 Long term (current) use of aspirin: Secondary | ICD-10-CM | POA: Diagnosis not present

## 2015-09-05 DIAGNOSIS — I700 Atherosclerosis of aorta: Secondary | ICD-10-CM | POA: Diagnosis not present

## 2015-09-05 DIAGNOSIS — F17210 Nicotine dependence, cigarettes, uncomplicated: Secondary | ICD-10-CM | POA: Insufficient documentation

## 2015-09-05 DIAGNOSIS — I2510 Atherosclerotic heart disease of native coronary artery without angina pectoris: Secondary | ICD-10-CM | POA: Insufficient documentation

## 2015-09-05 DIAGNOSIS — K219 Gastro-esophageal reflux disease without esophagitis: Secondary | ICD-10-CM | POA: Diagnosis not present

## 2015-09-05 DIAGNOSIS — K7689 Other specified diseases of liver: Secondary | ICD-10-CM | POA: Insufficient documentation

## 2015-09-05 DIAGNOSIS — K449 Diaphragmatic hernia without obstruction or gangrene: Secondary | ICD-10-CM | POA: Insufficient documentation

## 2015-09-05 DIAGNOSIS — I10 Essential (primary) hypertension: Secondary | ICD-10-CM | POA: Diagnosis not present

## 2015-09-05 DIAGNOSIS — Z79899 Other long term (current) drug therapy: Secondary | ICD-10-CM | POA: Diagnosis not present

## 2015-09-05 DIAGNOSIS — F209 Schizophrenia, unspecified: Secondary | ICD-10-CM | POA: Diagnosis not present

## 2015-09-05 LAB — COMPREHENSIVE METABOLIC PANEL
ALBUMIN: 4.3 g/dL (ref 3.5–5.0)
ALT: 15 U/L (ref 14–54)
ANION GAP: 6 (ref 5–15)
AST: 27 U/L (ref 15–41)
Alkaline Phosphatase: 71 U/L (ref 38–126)
BUN: 16 mg/dL (ref 6–20)
CHLORIDE: 104 mmol/L (ref 101–111)
CO2: 26 mmol/L (ref 22–32)
Calcium: 9 mg/dL (ref 8.9–10.3)
Creatinine, Ser: 0.97 mg/dL (ref 0.44–1.00)
GFR calc Af Amer: >60 mL/min (ref >60)
GFR calc non Af Amer: 57 mL/min — ABNORMAL LOW (ref >60)
GLUCOSE: 110 mg/dL — AB (ref 65–99)
POTASSIUM: 4.3 mmol/L (ref 3.5–5.1)
SODIUM: 136 mmol/L (ref 135–145)
TOTAL PROTEIN: 7.8 g/dL (ref 6.5–8.1)
Total Bilirubin: 0.5 mg/dL (ref 0.3–1.2)

## 2015-09-05 LAB — CBC WITH DIFFERENTIAL/PLATELET
Basophils Absolute: 0 10*3/uL (ref 0–0.1)
Basophils Relative: 1 %
EOS PCT: 5 %
Eosinophils Absolute: 0.3 10*3/uL (ref 0–0.7)
HEMATOCRIT: 39.4 % (ref 35.0–47.0)
Hemoglobin: 13.1 g/dL (ref 12.0–16.0)
LYMPHS ABS: 1.8 10*3/uL (ref 1.0–3.6)
LYMPHS PCT: 32 %
MCH: 27.9 pg (ref 26.0–34.0)
MCHC: 33.2 g/dL (ref 32.0–36.0)
MCV: 83.8 fL (ref 80.0–100.0)
Monocytes Absolute: 0.6 10*3/uL (ref 0.2–0.9)
Monocytes Relative: 12 %
Neutro Abs: 2.9 10*3/uL (ref 1.4–6.5)
Neutrophils Relative %: 50 %
Platelets: 354 10*3/uL (ref 150–440)
RBC: 4.7 MIL/uL (ref 3.80–5.20)
RDW: 14.6 % — AB (ref 11.5–14.5)
WBC: 5.6 10*3/uL (ref 3.6–11.0)

## 2015-09-05 MED ORDER — IOPAMIDOL (ISOVUE-300) INJECTION 61%
75.0000 mL | Freq: Once | INTRAVENOUS | Status: AC | PRN
  Administered 2015-09-05: 75 mL via INTRAVENOUS

## 2015-09-07 ENCOUNTER — Encounter: Payer: Self-pay | Admitting: Hematology and Oncology

## 2015-09-07 ENCOUNTER — Encounter (INDEPENDENT_AMBULATORY_CARE_PROVIDER_SITE_OTHER): Payer: Self-pay

## 2015-09-07 ENCOUNTER — Inpatient Hospital Stay (HOSPITAL_BASED_OUTPATIENT_CLINIC_OR_DEPARTMENT_OTHER): Payer: Medicare Other | Admitting: Hematology and Oncology

## 2015-09-07 ENCOUNTER — Ambulatory Visit: Payer: Medicare Other

## 2015-09-07 VITALS — BP 120/75 | HR 93 | Temp 96.5°F | Resp 18 | Wt 183.6 lb

## 2015-09-07 DIAGNOSIS — F17210 Nicotine dependence, cigarettes, uncomplicated: Secondary | ICD-10-CM

## 2015-09-07 DIAGNOSIS — Z85118 Personal history of other malignant neoplasm of bronchus and lung: Secondary | ICD-10-CM

## 2015-09-07 DIAGNOSIS — Z923 Personal history of irradiation: Secondary | ICD-10-CM | POA: Diagnosis not present

## 2015-09-07 DIAGNOSIS — K7689 Other specified diseases of liver: Secondary | ICD-10-CM

## 2015-09-07 DIAGNOSIS — C3432 Malignant neoplasm of lower lobe, left bronchus or lung: Secondary | ICD-10-CM

## 2015-09-07 DIAGNOSIS — Z79899 Other long term (current) drug therapy: Secondary | ICD-10-CM

## 2015-09-07 NOTE — Progress Notes (Signed)
[facility] day:  09/07/2015  Chief Complaint: Cheryl [name] is a 75 y.o. 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  HPI:  The patient is unable to provide any details of her medical history.  She presented to the emergency room with unsteady gait and slurred speech. Head MRI without contrast was negative. CXR on 07/09/2011 showed masslike density in the posterior left mid lung. Chest CT on 07/10/2011 revealed a 4.7 x 3.4 cm pleural-based mass in the left lower lobe.   PET scan on 07/11/2011 revealed hypermetabolic subpleural mass in the left lower lobe and mildly hypermetabolic left-sided mediastinal lymph nodes.  There was no evidence of FDG avid metastatic disease in the abdomen or pelvis.  Clinical stage was T2N2M0 (stage IIIA).  She underwent a CT-guided fine-needle aspiration on 07/25/2011.  Pathology revealed non-small cell carcinoma, favoring adenocarcinoma.     She received 4,000 cGy from 08/13/2011 - 09/09/2011 in a large field and 3,000 cGy in a small field boost from 10/13/2011 - 11/03/2011.  She did not receive chemotherapy secondary to her co-morbidities and poor performance status (ECOG 3).  She has had follow-up imaging.  PET scan on 08/12/2012 revealed resolution of the PET positive mass.  Chest CT on 09/05/2015 revealed stable postradiation changes in the left lung. There was no evidence of recurrent or metastatic disease in the chest.  There were stable hepatic cysts.  She last seen in the medical oncology clinic on 02/07/2015 by Dr. Roxana Hires.  At that time, she had mild shortness of breath.  She was smoking 1/2 pack a day.  During the interim, she has done well.  She denies any complaint.  She is smoking 6-8 cigarettes a day.   Past Medical History:  Diagnosis Date  . COPD (chronic obstructive pulmonary disease) ([place])   . Dementia   . Diabetes mellitus without complication  ([place])   . GERD (gastroesophageal reflux disease)   . Hyperlipidemia   . Hypertension   . Lung cancer ([place])   . Mental retardation   . Schizophrenia [facility])     Past Surgical History:  Procedure Laterality Date  . ABDOMINAL HYSTERECTOMY      History reviewed. No pertinent family history.  Social History:  reports that she has been smoking Cigarettes.  She has been smoking about 0.25 packs per day. She does not have any smokeless tobacco history on file. She reports that she does not drink alcohol or use drugs.  She smokes 6-8 cigarettes a day (1 every 2 hours).  She resides in a group home.  She is able to care for 80% of her ADLs.  The patient is accompanied by Whitman Hero, a care worker, today.  Allergies: No Known Allergies  Current Medications: Current Outpatient Prescriptions  Medication Sig Dispense Refill  . acetaminophen (TYLENOL) 325 MG tablet Take by mouth.    . Albuterol Sulfate 108 (90 BASE) MCG/ACT AEPB Inhale into the lungs.    . ALPRAZolam (XANAX) 0.25 MG tablet Take by mouth.    Marland Kitchen aluminum-magnesium hydroxide-simethicone (MAALOX) 063-016-01 MG/5ML SUSP Take by mouth.    Marland Kitchen amLODipine (NORVASC) 10 MG tablet Take by mouth.    Marland Kitchen aspirin 325 MG tablet Take by mouth.    . Dextromethorphan-Quinidine (NUEDEXTA) 20-10 MG CAPS Take 1 capsule by mouth daily.     . ferrous sulfate 325 (65 FE) MG tablet Take by mouth 2 (two) times daily with a  meal.     . Fluticasone-Salmeterol (ADVAIR) 250-50 MCG/DOSE AEPB Inhale 1 puff into the lungs 2 (two) times daily.    Marland Kitchen lisinopril (PRINIVIL,ZESTRIL) 10 MG tablet Take by mouth.    . magnesium hydroxide (MILK OF MAGNESIA) 400 MG/5ML suspension Take by mouth.    . Memantine HCl-Donepezil HCl (NAMZARIC) 28-10 MG CP24 Take 1 capsule by mouth daily.    . metFORMIN (GLUCOPHAGE) 500 MG tablet Take 500 mg by mouth 2 (two) times daily with a meal.     . modafinil (PROVIGIL) 100 MG tablet Take by mouth.    . Multiple Vitamin (MULTI-VITAMINS) TABS  Take by mouth.    Marland Kitchen omeprazole (PRILOSEC) 40 MG capsule Take by mouth.    . perphenazine (TRILAFON) 2 MG tablet Take by mouth.    . perphenazine (TRILAFON) 4 MG tablet Take 4 mg by mouth 3 (three) times daily.     . QUEtiapine (SEROQUEL XR) 400 MG 24 hr tablet Take 300 mg by mouth.     . simvastatin (ZOCOR) 20 MG tablet Take by mouth.    . Vitamin D, Ergocalciferol, (DRISDOL) 50000 UNITS CAPS capsule Take by mouth.    . potassium chloride SA (K-DUR,KLOR-CON) 20 MEQ tablet Take 1 tablet (20 mEq total) by mouth daily. (Patient not taking: Reported on 09/07/2015) 14 tablet 0   No current facility-administered medications for this visit.     Review of Systems:  GENERAL:  Feels good.  Active.  No fevers, sweats or weight loss. PERFORMANCE STATUS (ECOG):  3 HEENT:  No visual changes, runny nose, sore throat, mouth sores or tenderness. Lungs: No shortness of breath or cough.  No hemoptysis. Cardiac:  No chest pain, palpitations, orthopnea, or PND. GI:  No nausea, vomiting, diarrhea, constipation, melena or hematochezia. GU:  Incontinent.  No urgency, frequency, dysuria, or hematuria. Musculoskeletal:  No back pain.  No joint pain.  No muscle tenderness. Extremities:  No pain or swelling. Skin:  No rashes or skin changes. Neuro:  Mental retardation.  No headache, numbness or weakness, balance or coordination issues. Endocrine:  Diabetes.  No thyroid issues, hot flashes or night sweats. Psych:  No mood changes, depression or anxiety. Pain:  No focal pain. Review of systems:  All other systems reviewed and found to be negative.  Physical Exam: Blood pressure 120/75, pulse 93, temperature (!) 96.5 F (35.8 C), temperature source Tympanic, resp. rate 18, weight 183 lb 10.3 oz (83.3 kg). GENERAL:  Well developed, well nourished, woman sitting comfortably in the exam room in no acute distress. MENTAL STATUS:  Alert and oriented to person, place and time. HEAD:  Brown hair.  Normocephalic,  atraumatic, face symmetric, no Cushingoid features. EYES:  Glasses.  Brown eyes.  Bilateral arcus.  Pupils equal round and reactive to light and accomodation.  No conjunctivitis or scleral icterus. ENT:  Oropharynx clear without lesion.  Tongue normal. Mucous membranes moist.  RESPIRATORY:  Clear to auscultation without rales, wheezes or rhonchi. CARDIOVASCULAR:  Regular rate and rhythm without murmur, rub or gallop. ABDOMEN:  Soft, non-tender, with active bowel sounds, and no hepatosplenomegaly.  No masses. SKIN:  No rashes, ulcers or lesions. EXTREMITIES: No edema, no skin discoloration or tenderness.  No palpable cords. LYMPH NODES: No palpable cervical, supraclavicular, axillary or inguinal adenopathy  NEUROLOGICAL: Unremarkable. PSYCH:  Appropriate.  Appointment on 09/05/2015  Component Date Value Ref Range Status  . WBC 09/05/2015 5.6  3.6 - 11.0 K/uL Final  . RBC 09/05/2015 4.70  3.80 - 5.20  MIL/uL Final  . Hemoglobin 09/05/2015 13.1  12.0 - 16.0 g/dL Final  . HCT 09/05/2015 39.4  35.0 - 47.0 % Final  . MCV 09/05/2015 83.8  80.0 - 100.0 fL Final  . MCH 09/05/2015 27.9  26.0 - 34.0 pg Final  . MCHC 09/05/2015 33.2  32.0 - 36.0 g/dL Final  . RDW 09/05/2015 14.6* 11.5 - 14.5 % Final  . Platelets 09/05/2015 354  150 - 440 K/uL Final  . Neutrophils Relative % 09/05/2015 50  % Final  . Neutro Abs 09/05/2015 2.9  1.4 - 6.5 K/uL Final  . Lymphocytes Relative 09/05/2015 32  % Final  . Lymphs Abs 09/05/2015 1.8  1.0 - 3.6 K/uL Final  . Monocytes Relative 09/05/2015 12  % Final  . Monocytes Absolute 09/05/2015 0.6  0.2 - 0.9 K/uL Final  . Eosinophils Relative 09/05/2015 5  % Final  . Eosinophils Absolute 09/05/2015 0.3  0 - 0.7 K/uL Final  . Basophils Relative 09/05/2015 1  % Final  . Basophils Absolute 09/05/2015 0.0  0 - 0.1 K/uL Final  . Sodium 09/05/2015 136  135 - 145 mmol/L Final  . Potassium 09/05/2015 4.3  3.5 - 5.1 mmol/L Final  . Chloride 09/05/2015 104  101 - 111 mmol/L  Final  . CO2 09/05/2015 26  22 - 32 mmol/L Final  . Glucose, Bld 09/05/2015 110* 65 - 99 mg/dL Final  . BUN 09/05/2015 16  6 - 20 mg/dL Final  . Creatinine, Ser 09/05/2015 0.97  0.44 - 1.00 mg/dL Final  . Calcium 09/05/2015 9.0  8.9 - 10.3 mg/dL Final  . Total Protein 09/05/2015 7.8  6.5 - 8.1 g/dL Final  . Albumin 09/05/2015 4.3  3.5 - 5.0 g/dL Final  . AST 09/05/2015 27  15 - 41 U/L Final  . ALT 09/05/2015 15  14 - 54 U/L Final  . Alkaline Phosphatase 09/05/2015 71  38 - 126 U/L Final  . Total Bilirubin 09/05/2015 0.5  0.3 - 1.2 mg/dL Final  . GFR calc non Af Amer 09/05/2015 57* >60 mL/min Final  . GFR calc Af Amer 09/05/2015 >60  >60 mL/min Final   Comment: (NOTE) The eGFR has been calculated using the CKD EPI equation. This calculation has not been validated in all clinical situations. eGFR's persistently <60 mL/min signify possible Chronic Kidney Disease.   . Anion gap 09/05/2015 6  5 - 15 Final    Assessment:  Cheryl [name] is a 75 y.o. 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  Chest CT on 07/10/2011 revealed a 4.7 x 3.4 cm pleural-based mass in the left lower lobe.  CT-guided fine-needle aspiration on 07/25/2011 revealed non-small cell carcinoma, favoring adenocarcinoma.    PET scan on 07/11/2011 revealed hypermetabolic subpleural mass in the left lower lobe and mildly hypermetabolic left-sided mediastinal lymph nodes.  There was no evidence of FDG avid metastatic disease in the abdomen or pelvis.  Clinical stage was T2N2M0 (stage IIIA).   She received 4,000 cGy from 08/13/2011 - 09/09/2011 in a large field and 3,000 cGy in a small field boost from 10/13/2011 - 11/03/2011.  She did not receive chemotherapy secondary to her co-morbidities and poor performance status.  PET scan on 08/12/2012 revealed resolution of the PET positive mass.  Chest CT on 09/05/2015 revealed stable postradiation changes in the left lung. There was no evidence of  recurrent or metastatic disease in the chest.  There were stable hepatic cysts.  She has mental retardation.  She lives in a group home.  Symptomatically, she denies any complaint.  Exam is unremarkable.  She is smoking 6-8 cigarettes a day.  Plan: 1.  Review entire medical history, diagnosis and management of lung cancer. 2.  Discuss plan for ongoing follow-up every 6 months. 3.  Encourage smoking cessation. 4.  Schedule chest CT in 6 months 5.  RTC in 6 months for MD assessment, labs (CBC with diff, CMP, CEA), and review of chest CT.   Lequita Asal, MD  09/07/2015, 3:15 PM

## 2015-09-08 ENCOUNTER — Encounter: Payer: Self-pay | Admitting: Hematology and Oncology

## 2016-01-05 ENCOUNTER — Emergency Department: Payer: Medicare Other

## 2016-01-05 ENCOUNTER — Emergency Department
Admission: EM | Admit: 2016-01-05 | Discharge: 2016-01-05 | Disposition: A | Discharge status: Home / Self Care | Payer: Medicare Other | Source: Home / Self Care | Attending: Emergency Medicine | Admitting: Emergency Medicine

## 2016-01-05 DIAGNOSIS — Z85118 Personal history of other malignant neoplasm of bronchus and lung: Secondary | ICD-10-CM

## 2016-01-05 DIAGNOSIS — F17210 Nicotine dependence, cigarettes, uncomplicated: Secondary | ICD-10-CM

## 2016-01-05 DIAGNOSIS — R0602 Shortness of breath: Secondary | ICD-10-CM | POA: Diagnosis not present

## 2016-01-05 DIAGNOSIS — I10 Essential (primary) hypertension: Secondary | ICD-10-CM

## 2016-01-05 DIAGNOSIS — Z7982 Long term (current) use of aspirin: Secondary | ICD-10-CM | POA: Insufficient documentation

## 2016-01-05 DIAGNOSIS — Z79899 Other long term (current) drug therapy: Secondary | ICD-10-CM

## 2016-01-05 DIAGNOSIS — J441 Chronic obstructive pulmonary disease with (acute) exacerbation: Secondary | ICD-10-CM

## 2016-01-05 DIAGNOSIS — Z7984 Long term (current) use of oral hypoglycemic drugs: Secondary | ICD-10-CM

## 2016-01-05 DIAGNOSIS — E119 Type 2 diabetes mellitus without complications: Secondary | ICD-10-CM | POA: Insufficient documentation

## 2016-01-05 LAB — CBC WITH DIFFERENTIAL/PLATELET
Basophils Absolute: 0 10*3/uL (ref 0–0.1)
Basophils Relative: 1 %
EOS PCT: 3 %
Eosinophils Absolute: 0.2 10*3/uL (ref 0–0.7)
HCT: 37.3 % (ref 35.0–47.0)
HEMOGLOBIN: 12.5 g/dL (ref 12.0–16.0)
LYMPHS PCT: 19 %
Lymphs Abs: 1.1 10*3/uL (ref 1.0–3.6)
MCH: 28 pg (ref 26.0–34.0)
MCHC: 33.4 g/dL (ref 32.0–36.0)
MCV: 84 fL (ref 80.0–100.0)
Monocytes Absolute: 1 10*3/uL — ABNORMAL HIGH (ref 0.2–0.9)
Monocytes Relative: 17 %
Neutro Abs: 3.6 10*3/uL (ref 1.4–6.5)
Neutrophils Relative %: 60 %
Platelets: 258 10*3/uL (ref 150–440)
RBC: 4.44 MIL/uL (ref 3.80–5.20)
RDW: 14.6 % — ABNORMAL HIGH (ref 11.5–14.5)
WBC: 6 10*3/uL (ref 3.6–11.0)

## 2016-01-05 LAB — BASIC METABOLIC PANEL
Anion gap: 10 (ref 5–15)
BUN: 14 mg/dL (ref 6–20)
CO2: 27 mmol/L (ref 22–32)
Calcium: 9 mg/dL (ref 8.9–10.3)
Chloride: 105 mmol/L (ref 101–111)
Creatinine, Ser: 0.99 mg/dL (ref 0.44–1.00)
GFR calc Af Amer: >60 mL/min (ref >60)
GFR calc non Af Amer: 55 mL/min — ABNORMAL LOW (ref >60)
Glucose, Bld: 159 mg/dL — ABNORMAL HIGH (ref 65–99)
POTASSIUM: 3.2 mmol/L — AB (ref 3.5–5.1)
SODIUM: 142 mmol/L (ref 135–145)

## 2016-01-05 MED ORDER — AZITHROMYCIN 250 MG PO TABS: ORAL_TABLET | ORAL | 0 refills | Status: DC

## 2016-01-05 MED ORDER — IPRATROPIUM-ALBUTEROL 0.5-2.5 (3) MG/3ML IN SOLN
3.0000 mL | Freq: Once | RESPIRATORY_TRACT | Status: AC
  Administered 2016-01-05: 3 mL via RESPIRATORY_TRACT
  Filled 2016-01-05: qty 3

## 2016-01-05 MED ORDER — ALBUTEROL SULFATE (2.5 MG/3ML) 0.083% IN NEBU
5.0000 mg | INHALATION_SOLUTION | Freq: Once | RESPIRATORY_TRACT | Status: AC
  Administered 2016-01-05: 5 mg via RESPIRATORY_TRACT
  Filled 2016-01-05: qty 6

## 2016-01-05 MED ORDER — METHYLPREDNISOLONE SODIUM SUCC 125 MG IJ SOLR
80.0000 mg | Freq: Once | INTRAMUSCULAR | Status: AC
Start: 2016-01-05 — End: 2016-01-05
  Administered 2016-01-05: 80 mg via INTRAVENOUS
  Filled 2016-01-05: qty 2

## 2016-01-05 MED ORDER — IOPAMIDOL (ISOVUE-370) INJECTION 76%
75.0000 mL | Freq: Once | INTRAVENOUS | Status: AC | PRN
  Administered 2016-01-05: 75 mL via INTRAVENOUS

## 2016-01-05 MED ORDER — PREDNISONE 20 MG PO TABS: 40.0000 mg | ORAL_TABLET | Freq: Every day | ORAL | 0 refills | Status: DC

## 2016-01-05 MED ORDER — ALBUTEROL SULFATE HFA 108 (90 BASE) MCG/ACT IN AERS: 2.0000 | INHALATION_SPRAY | RESPIRATORY_TRACT | 0 refills | Status: DC | PRN

## 2016-01-05 NOTE — Discharge Instructions (Signed)
Your chest xray and CT scan of the chest today were unremarkable.  Take prednisone and azithromycin to control your COPD flare up, and continue your Advair and albuterol.

## 2016-01-05 NOTE — ED Provider Notes (Signed)
[facility] Emergency Department Provider Note  ____________________________________________  Time seen: Approximately 1:27 PM  I have reviewed the triage vital signs and the nursing notes.   HISTORY  Chief Complaint Shortness of Breath Level 5 caveat:  Portions of the history and physical were unable to be obtained due to the patient's chronic dementia   HPI Cheryl [name] is a 75 y.o. female brought to the ED from her group home with her caregiver due to gradual onset shortness of breath for last 2 days. Nonproductive cough as well. No chest pain. No vomiting, but the patient has had some loose bowel movements over the last 2 days as well. No sick contacts at home. A history of COPD and has been taking Advair as prescribed. Also has a history of lung cancer.     Past Medical History:  Diagnosis Date  . COPD (chronic obstructive pulmonary disease) ([place])   . Dementia   . Diabetes mellitus without complication ([place])   . GERD (gastroesophageal reflux disease)   . Hyperlipidemia   . Hypertension   . Lung cancer ([place])   . Mental retardation   . Schizophrenia [facility])      Patient Active Problem List   Diagnosis Date Noted  . Hypokalemia 02/07/2015  . Cancer of left lung ([place]) 07/25/2011     Past Surgical History:  Procedure Laterality Date  . ABDOMINAL HYSTERECTOMY       Prior to Admission medications   Medication Sig Start Date End Date Taking? Authorizing Provider  acetaminophen (TYLENOL) 325 MG tablet Take 325 mg by mouth daily as needed.    Yes Historical Provider, MD  Albuterol Sulfate 108 (90 BASE) MCG/ACT AEPB Inhale 2 puffs into the lungs every 4 (four) hours as needed.    Yes Historical Provider, MD  ALPRAZolam (XANAX) 0.25 MG tablet Take 0.25 mg by mouth 2 (two) times daily.    Yes Historical Provider, MD  amLODipine (NORVASC) 10 MG tablet Take 10 mg by mouth daily.    Yes Historical Provider, MD  aspirin 325 MG tablet Take 325 mg by  mouth daily.    Yes Historical Provider, MD  Dextromethorphan-Quinidine (NUEDEXTA) 20-10 MG CAPS Take 1 capsule by mouth at bedtime.    Yes Historical Provider, MD  ferrous sulfate 325 (65 FE) MG tablet Take by mouth 2 (two) times daily with a meal.    Yes Historical Provider, MD  Fluticasone-Salmeterol (ADVAIR) 250-50 MCG/DOSE AEPB Inhale 1 puff into the lungs 2 (two) times daily.   Yes Historical Provider, MD  lisinopril (PRINIVIL,ZESTRIL) 10 MG tablet Take 10 mg by mouth daily.    Yes Historical Provider, MD  magnesium hydroxide (MILK OF MAGNESIA) 400 MG/5ML suspension Take 40 mLs by mouth daily.    Yes Historical Provider, MD  Memantine HCl-Donepezil HCl (NAMZARIC) 28-10 MG CP24 Take 1 capsule by mouth daily.   Yes Historical Provider, MD  metFORMIN (GLUCOPHAGE) 500 MG tablet Take 500 mg by mouth 2 (two) times daily with a meal.    Yes Historical Provider, MD  modafinil (PROVIGIL) 100 MG tablet Take 100 mg by mouth daily.    Yes Historical Provider, MD  Multiple Vitamin (MULTI-VITAMINS) TABS Take 1 tablet by mouth daily.    Yes Historical Provider, MD  omeprazole (PRILOSEC) 40 MG capsule Take 40 mg by mouth daily.    Yes Historical Provider, MD  perphenazine (TRILAFON) 2 MG tablet Take by mouth.   Yes Historical Provider, MD  perphenazine (TRILAFON) 4 MG tablet Take  4 mg by mouth 3 (three) times daily.    Yes Historical Provider, MD  QUEtiapine (SEROQUEL XR) 400 MG 24 hr tablet Take 300 mg by mouth daily.    Yes Historical Provider, MD  simvastatin (ZOCOR) 20 MG tablet Take 20 mg by mouth at bedtime.    Yes Historical Provider, MD  Vitamin D, Ergocalciferol, (DRISDOL) 50000 UNITS CAPS capsule Take 50,000 Units by mouth every 30 (thirty) days.    Yes Historical Provider, MD  albuterol (PROVENTIL HFA) 108 (90 Base) MCG/ACT inhaler Inhale 2 puffs into the lungs every 4 (four) hours as needed for wheezing or shortness of breath. 01/05/16   Carrie Mew, MD  azithromycin (ZITHROMAX Z-PAK) 250 MG  tablet Take 2 tablets (500 mg) on  Day 1,  followed by 1 tablet (250 mg) once daily on Days 2 through 5. 01/05/16   Carrie Mew, MD  potassium chloride SA (K-DUR,KLOR-CON) 20 MEQ tablet Take 1 tablet (20 mEq total) by mouth daily. Patient not taking: Reported on 01/05/2016 02/07/15   Roxana Hires, MD  predniSONE (DELTASONE) 20 MG tablet Take 2 tablets (40 mg total) by mouth daily. 01/05/16   Carrie Mew, MD     Allergies Patient has no known allergies.   No family history on file.  Social History Social History  Substance Use Topics  . Smoking status: Current Some Day Smoker    Packs/day: 0.25    Types: Cigarettes  . Smokeless tobacco: Never Used  . Alcohol use No    Review of Systems  Constitutional:   No fever or chills.  ENT:   No sore throat. No rhinorrhea. Cardiovascular:   No chest pain. Respiratory:   Positive shortness of breath and cough. Gastrointestinal:   Negative for abdominal pain, vomiting and diarrhea.  Genitourinary:   Negative for dysuria or difficulty urinating. Musculoskeletal:   Negative for focal pain or swelling Neurological:   Negative for headaches 10-point ROS otherwise negative.  ____________________________________________   PHYSICAL EXAM:  VITAL SIGNS: ED Triage Vitals  Enc Vitals Group     BP 01/05/16 1225 (!) 140/54     Pulse Rate 01/05/16 1225 (!) 50     Resp 01/05/16 1225 20     Temp 01/05/16 1225 98.7 F (37.1 C)     Temp Source 01/05/16 1225 Oral     SpO2 01/05/16 1225 94 %     Weight 01/05/16 1225 168 lb (76.2 kg)     Height 01/05/16 1225 '5\' 6"'$  (1.676 m)     Head Circumference --      Peak Flow --      Pain Score 01/05/16 1235 0     Pain Loc --      Pain Edu? --      Excl. in [place]? --     Vital signs reviewed, nursing assessments reviewed.   Constitutional:   Alert and orientedTo person and place. Not in distress. Eyes:   No scleral icterus. No conjunctival pallor. PERRL. EOMI.  No nystagmus. ENT    Head:   Normocephalic and atraumatic.   Nose:   No congestion/rhinnorhea. No septal hematoma   Mouth/Throat:   MMM, no pharyngeal erythema. No peritonsillar mass.    Neck:   No stridor. No SubQ emphysema. No meningismus. Hematological/Lymphatic/Immunilogical:   No cervical lymphadenopathy. Cardiovascular:   RRR. Symmetric bilateral radial and DP pulses.  No murmurs.  Respiratory:   Normal respiratory effort without tachypnea nor retractions. Good air entry in all lung fields. Diffuse expiratory wheezing  and coarse breath sounds.  Gastrointestinal:   Soft and nontender. Non distended. There is no CVA tenderness.  No rebound, rigidity, or guarding. Genitourinary:   deferred Musculoskeletal:   Nontender with normal range of motion in all extremities. No joint effusions.  No lower extremity tenderness.  No edema. Neurologic:   Normal speech and language.  CN 2-10 normal. Motor grossly intact. No gross focal neurologic deficits are appreciated.  Skin:    Skin is warm, dry and intact. No rash noted.  No petechiae, purpura, or bullae.  ____________________________________________    LABS (pertinent positives/negatives) (all labs ordered are listed, but only abnormal results are displayed) Labs Reviewed  BASIC METABOLIC PANEL - Abnormal; Notable for the following:       Result Value   Potassium 3.2 (*)    Glucose, Bld 159 (*)    GFR calc non Af Amer 55 (*)    All other components within normal limits  CBC WITH DIFFERENTIAL/PLATELET - Abnormal; Notable for the following:    RDW 14.6 (*)    Monocytes Absolute 1.0 (*)    All other components within normal limits   ____________________________________________   EKG  Interpreted by me Sinus rhythm rate of 92, normal axis and intervals. Right bundle branch block. Normal ST segments and T waves. 5 PVCs on the strip in a trigeminy pattern.  ____________________________________________    RADIOLOGY Chest x-ray unremarkable CT  angiogram essentially unremarkable. No evidence of PE or significant consolidation.  ____________________________________________   PROCEDURES Procedures  ____________________________________________   INITIAL IMPRESSION / ASSESSMENT AND PLAN / ED COURSE  Pertinent labs & imaging results that were available during my care of the patient were reviewed by me and considered in my medical decision making (see chart for details).  Patient presents with shortness of breath. Due to limitations of history taking with her dementia and history of lung cancer, we'll get a CT angiogram to evaluate for PE. Most likely this is a COPD exacerbation requiring a short course of steroids and antibiotics. Will give nebs and slightly Medrol in the ED and reassess after CT.    ----------------------------------------- 2:54 PM on 01/05/2016 -----------------------------------------  Imaging unremarkable. We'll discharge home on prednisone and azithromycin.   Clinical Course    ____________________________________________   FINAL CLINICAL IMPRESSION(S) / ED DIAGNOSES  Final diagnoses:  COPD exacerbation ([place])  SOB (shortness of breath)      New Prescriptions   ALBUTEROL (PROVENTIL HFA) 108 (90 BASE) MCG/ACT INHALER    Inhale 2 puffs into the lungs every 4 (four) hours as needed for wheezing or shortness of breath.   AZITHROMYCIN (ZITHROMAX Z-PAK) 250 MG TABLET    Take 2 tablets (500 mg) on  Day 1,  followed by 1 tablet (250 mg) once daily on Days 2 through 5.   PREDNISONE (DELTASONE) 20 MG TABLET    Take 2 tablets (40 mg total) by mouth daily.     Portions of this note were generated with dragon dictation software. Dictation errors may occur despite best attempts at proofreading.    Carrie Mew, MD 01/05/16 1455

## 2016-01-05 NOTE — ED Triage Notes (Signed)
Pt reports to ED from [facility] w/ caregiver present.  Per caregiver SOB started yesterday, worse today.  Pt c/o non productive cough.  Wheezing audible.  Pt denies CP, fever, n/v/d or LOC.  Pts HR 50 upon palpation by Lenny Pastel EMT/P.

## 2016-01-08 ENCOUNTER — Emergency Department: Payer: Medicare Other

## 2016-01-08 ENCOUNTER — Inpatient Hospital Stay
Admission: EM | Admit: 2016-01-08 | Discharge: 2016-01-10 | DRG: 192 | Disposition: A | Discharge status: Home / Self Care | Payer: Medicare Other | Attending: Internal Medicine | Admitting: Internal Medicine

## 2016-01-08 ENCOUNTER — Encounter: Payer: Self-pay | Admitting: *Deleted

## 2016-01-08 DIAGNOSIS — J441 Chronic obstructive pulmonary disease with (acute) exacerbation: Secondary | ICD-10-CM | POA: Diagnosis present

## 2016-01-08 DIAGNOSIS — F209 Schizophrenia, unspecified: Secondary | ICD-10-CM | POA: Diagnosis present

## 2016-01-08 DIAGNOSIS — R0602 Shortness of breath: Secondary | ICD-10-CM | POA: Diagnosis present

## 2016-01-08 DIAGNOSIS — E785 Hyperlipidemia, unspecified: Secondary | ICD-10-CM | POA: Diagnosis present

## 2016-01-08 DIAGNOSIS — E119 Type 2 diabetes mellitus without complications: Secondary | ICD-10-CM | POA: Diagnosis present

## 2016-01-08 DIAGNOSIS — Z85118 Personal history of other malignant neoplasm of bronchus and lung: Secondary | ICD-10-CM | POA: Diagnosis not present

## 2016-01-08 DIAGNOSIS — Z7952 Long term (current) use of systemic steroids: Secondary | ICD-10-CM

## 2016-01-08 DIAGNOSIS — Z7984 Long term (current) use of oral hypoglycemic drugs: Secondary | ICD-10-CM | POA: Diagnosis not present

## 2016-01-08 DIAGNOSIS — Z90710 Acquired absence of both cervix and uterus: Secondary | ICD-10-CM

## 2016-01-08 DIAGNOSIS — Z7951 Long term (current) use of inhaled steroids: Secondary | ICD-10-CM | POA: Diagnosis not present

## 2016-01-08 DIAGNOSIS — F17210 Nicotine dependence, cigarettes, uncomplicated: Secondary | ICD-10-CM | POA: Diagnosis present

## 2016-01-08 DIAGNOSIS — I4510 Unspecified right bundle-branch block: Secondary | ICD-10-CM | POA: Diagnosis present

## 2016-01-08 DIAGNOSIS — E876 Hypokalemia: Secondary | ICD-10-CM | POA: Diagnosis present

## 2016-01-08 DIAGNOSIS — F0390 Unspecified dementia without behavioral disturbance: Secondary | ICD-10-CM | POA: Diagnosis present

## 2016-01-08 DIAGNOSIS — Z7982 Long term (current) use of aspirin: Secondary | ICD-10-CM | POA: Diagnosis not present

## 2016-01-08 DIAGNOSIS — K219 Gastro-esophageal reflux disease without esophagitis: Secondary | ICD-10-CM | POA: Diagnosis present

## 2016-01-08 DIAGNOSIS — I10 Essential (primary) hypertension: Secondary | ICD-10-CM | POA: Diagnosis present

## 2016-01-08 DIAGNOSIS — F70 Mild intellectual disabilities: Secondary | ICD-10-CM | POA: Diagnosis present

## 2016-01-08 DIAGNOSIS — M6281 Muscle weakness (generalized): Secondary | ICD-10-CM

## 2016-01-08 LAB — CBC WITH DIFFERENTIAL/PLATELET
Basophils Absolute: 0 10*3/uL (ref 0–0.1)
Basophils Relative: 1 %
Eosinophils Absolute: 0.1 10*3/uL (ref 0–0.7)
Eosinophils Relative: 1 %
HEMATOCRIT: 37.6 % (ref 35.0–47.0)
Hemoglobin: 12.3 g/dL (ref 12.0–16.0)
LYMPHS ABS: 0.5 10*3/uL — AB (ref 1.0–3.6)
LYMPHS PCT: 7 %
MCH: 27.6 pg (ref 26.0–34.0)
MCHC: 32.6 g/dL (ref 32.0–36.0)
MCV: 84.5 fL (ref 80.0–100.0)
MONO ABS: 0.2 10*3/uL (ref 0.2–0.9)
MONOS PCT: 3 %
NEUTROS ABS: 6.9 10*3/uL — AB (ref 1.4–6.5)
Neutrophils Relative %: 88 %
Platelets: 282 10*3/uL (ref 150–440)
RBC: 4.45 MIL/uL (ref 3.80–5.20)
RDW: 14.4 % (ref 11.5–14.5)
WBC: 7.7 10*3/uL (ref 3.6–11.0)

## 2016-01-08 LAB — BASIC METABOLIC PANEL
Anion gap: 10 (ref 5–15)
BUN: 18 mg/dL (ref 6–20)
CALCIUM: 8.7 mg/dL — AB (ref 8.9–10.3)
CHLORIDE: 99 mmol/L — AB (ref 101–111)
CO2: 27 mmol/L (ref 22–32)
CREATININE: 0.92 mg/dL (ref 0.44–1.00)
GFR calc Af Amer: >60 mL/min (ref >60)
GFR calc non Af Amer: 60 mL/min — ABNORMAL LOW (ref >60)
GLUCOSE: 263 mg/dL — AB (ref 65–99)
Potassium: 4.5 mmol/L (ref 3.5–5.1)
Sodium: 136 mmol/L (ref 135–145)

## 2016-01-08 LAB — MRSA PCR SCREENING: MRSA BY PCR: NEGATIVE

## 2016-01-08 MED ORDER — ONDANSETRON HCL 4 MG/2ML IJ SOLN: 4.0000 mg | Freq: Four times a day (QID) | INTRAMUSCULAR | Status: DC | PRN

## 2016-01-08 MED ORDER — DONEPEZIL HCL 5 MG PO TABS
10.0000 mg | ORAL_TABLET | Freq: Every day | ORAL | Status: DC
  Administered 2016-01-08 – 2016-01-09 (×2): 10 mg via ORAL
  Filled 2016-01-08 (×2): qty 2

## 2016-01-08 MED ORDER — ALPRAZOLAM 0.25 MG PO TABS
0.2500 mg | ORAL_TABLET | Freq: Two times a day (BID) | ORAL | Status: DC
  Administered 2016-01-08 – 2016-01-10 (×4): 0.25 mg via ORAL
  Filled 2016-01-08 (×4): qty 1

## 2016-01-08 MED ORDER — IPRATROPIUM-ALBUTEROL 0.5-2.5 (3) MG/3ML IN SOLN
3.0000 mL | Freq: Once | RESPIRATORY_TRACT | Status: AC
  Administered 2016-01-08: 3 mL via RESPIRATORY_TRACT
  Filled 2016-01-08: qty 3

## 2016-01-08 MED ORDER — BISACODYL 5 MG PO TBEC: 5.0000 mg | DELAYED_RELEASE_TABLET | Freq: Every day | ORAL | Status: DC | PRN

## 2016-01-08 MED ORDER — QUETIAPINE FUMARATE ER 300 MG PO TB24
300.0000 mg | ORAL_TABLET | Freq: Every day | ORAL | Status: DC
  Administered 2016-01-08 – 2016-01-09 (×2): 300 mg via ORAL
  Filled 2016-01-08 (×2): qty 1

## 2016-01-08 MED ORDER — SIMVASTATIN 20 MG PO TABS
20.0000 mg | ORAL_TABLET | Freq: Every day | ORAL | Status: DC
  Administered 2016-01-08 – 2016-01-09 (×2): 20 mg via ORAL
  Filled 2016-01-08 (×3): qty 1

## 2016-01-08 MED ORDER — MAGNESIUM HYDROXIDE 400 MG/5ML PO SUSP: 40.0000 mL | Freq: Every day | ORAL | Status: DC | PRN

## 2016-01-08 MED ORDER — MOMETASONE FURO-FORMOTEROL FUM 200-5 MCG/ACT IN AERO
2.0000 | INHALATION_SPRAY | Freq: Two times a day (BID) | RESPIRATORY_TRACT | Status: DC
Start: 2016-01-08 — End: 2016-01-10
  Administered 2016-01-08 – 2016-01-10 (×4): 2 via RESPIRATORY_TRACT
  Filled 2016-01-08: qty 8.8

## 2016-01-08 MED ORDER — ONDANSETRON HCL 4 MG PO TABS: 4.0000 mg | ORAL_TABLET | Freq: Four times a day (QID) | ORAL | Status: DC | PRN

## 2016-01-08 MED ORDER — METFORMIN HCL 500 MG PO TABS
500.0000 mg | ORAL_TABLET | Freq: Two times a day (BID) | ORAL | Status: DC
  Administered 2016-01-09 – 2016-01-10 (×3): 500 mg via ORAL
  Filled 2016-01-08 (×3): qty 1

## 2016-01-08 MED ORDER — LISINOPRIL 10 MG PO TABS
10.0000 mg | ORAL_TABLET | Freq: Every day | ORAL | Status: DC
  Administered 2016-01-09 – 2016-01-10 (×2): 10 mg via ORAL
  Filled 2016-01-08 (×2): qty 1

## 2016-01-08 MED ORDER — MODAFINIL 100 MG PO TABS
100.0000 mg | ORAL_TABLET | Freq: Every day | ORAL | Status: DC
  Administered 2016-01-09 – 2016-01-10 (×2): 100 mg via ORAL
  Filled 2016-01-08: qty 1

## 2016-01-08 MED ORDER — VITAMIN D (ERGOCALCIFEROL) 1.25 MG (50000 UNIT) PO CAPS
50000.0000 [IU] | ORAL_CAPSULE | ORAL | Status: DC
  Administered 2016-01-08: 21:00:00 50000 [IU] via ORAL
  Filled 2016-01-08: qty 1

## 2016-01-08 MED ORDER — METHYLPREDNISOLONE SODIUM SUCC 125 MG IJ SOLR
125.0000 mg | Freq: Once | INTRAMUSCULAR | Status: AC
  Administered 2016-01-08: 125 mg via INTRAVENOUS
  Filled 2016-01-08: qty 2

## 2016-01-08 MED ORDER — ALBUTEROL SULFATE (2.5 MG/3ML) 0.083% IN NEBU
2.5000 mg | INHALATION_SOLUTION | RESPIRATORY_TRACT | Status: DC | PRN
  Administered 2016-01-09 – 2016-01-10 (×3): 2.5 mg via RESPIRATORY_TRACT
  Filled 2016-01-08 (×3): qty 3

## 2016-01-08 MED ORDER — DEXTROMETHORPHAN-QUINIDINE 20-10 MG PO CAPS
1.0000 | ORAL_CAPSULE | Freq: Every day | ORAL | Status: DC
  Administered 2016-01-08 – 2016-01-09 (×2): 1 via ORAL
  Filled 2016-01-08 (×3): qty 1

## 2016-01-08 MED ORDER — INFLUENZA VAC SPLIT QUAD 0.5 ML IM SUSY: 0.5000 mL | PREFILLED_SYRINGE | INTRAMUSCULAR | Status: DC

## 2016-01-08 MED ORDER — PERPHENAZINE 4 MG PO TABS
4.0000 mg | ORAL_TABLET | Freq: Three times a day (TID) | ORAL | Status: DC
  Administered 2016-01-08 – 2016-01-10 (×5): 4 mg via ORAL
  Filled 2016-01-08 (×5): qty 1

## 2016-01-08 MED ORDER — AMLODIPINE BESYLATE 10 MG PO TABS
10.0000 mg | ORAL_TABLET | Freq: Every day | ORAL | Status: DC
  Administered 2016-01-09 – 2016-01-10 (×2): 10 mg via ORAL
  Filled 2016-01-08 (×2): qty 1

## 2016-01-08 MED ORDER — ASPIRIN 325 MG PO TABS
325.0000 mg | ORAL_TABLET | Freq: Every day | ORAL | Status: DC
  Administered 2016-01-09 – 2016-01-10 (×2): 325 mg via ORAL
  Filled 2016-01-08 (×2): qty 1

## 2016-01-08 MED ORDER — HEPARIN SODIUM (PORCINE) 5000 UNIT/ML IJ SOLN
5000.0000 [IU] | Freq: Three times a day (TID) | INTRAMUSCULAR | Status: DC
  Administered 2016-01-08 – 2016-01-10 (×5): 5000 [IU] via SUBCUTANEOUS
  Filled 2016-01-08 (×5): qty 1

## 2016-01-08 MED ORDER — TRAZODONE HCL 50 MG PO TABS: 25.0000 mg | ORAL_TABLET | Freq: Every evening | ORAL | Status: DC | PRN

## 2016-01-08 MED ORDER — MEMANTINE HCL-DONEPEZIL HCL ER 28-10 MG PO CP24: 1.0000 | ORAL_CAPSULE | Freq: Every day | ORAL | Status: DC

## 2016-01-08 MED ORDER — ACETAMINOPHEN 650 MG RE SUPP: 650.0000 mg | Freq: Four times a day (QID) | RECTAL | Status: DC | PRN

## 2016-01-08 MED ORDER — DOCUSATE SODIUM 100 MG PO CAPS
100.0000 mg | ORAL_CAPSULE | Freq: Two times a day (BID) | ORAL | Status: DC
  Administered 2016-01-08 – 2016-01-10 (×4): 100 mg via ORAL
  Filled 2016-01-08 (×4): qty 1

## 2016-01-08 MED ORDER — ADULT MULTIVITAMIN W/MINERALS CH
1.0000 | ORAL_TABLET | Freq: Every day | ORAL | Status: DC
  Administered 2016-01-09 – 2016-01-10 (×2): 1 via ORAL
  Filled 2016-01-08 (×2): qty 1

## 2016-01-08 MED ORDER — ACETAMINOPHEN 325 MG PO TABS: 650.0000 mg | ORAL_TABLET | Freq: Four times a day (QID) | ORAL | Status: DC | PRN

## 2016-01-08 MED ORDER — MEMANTINE HCL ER 28 MG PO CP24
28.0000 mg | ORAL_CAPSULE | Freq: Every day | ORAL | Status: DC
  Administered 2016-01-08 – 2016-01-09 (×2): 28 mg via ORAL
  Filled 2016-01-08 (×3): qty 1

## 2016-01-08 MED ORDER — PANTOPRAZOLE SODIUM 40 MG PO TBEC
40.0000 mg | DELAYED_RELEASE_TABLET | Freq: Every day | ORAL | Status: DC
  Administered 2016-01-09 – 2016-01-10 (×2): 40 mg via ORAL
  Filled 2016-01-08 (×2): qty 1

## 2016-01-08 NOTE — ED Notes (Signed)
Patient placed on 2L O2 via [place] for sats at 90-92% RA

## 2016-01-08 NOTE — H&P (Addendum)
[facility] NAME: Cheryl [name]    MR#:  [id number]  DATE OF BIRTH:  July 09, 1940  DATE OF ADMISSION:  01/08/2016  PRIMARY CARE PHYSICIAN: Harriett Neita Carp, MD   REQUESTING/REFERRING PHYSICIAN: Carrie Mew, MD  CHIEF COMPLAINT:   Chief Complaint  Patient presents with  . Shortness of Breath    HISTORY OF PRESENT ILLNESS:  Cheryl [name]  is a 75 y.o. female with a known history of COPD, dementia, hypertension, and mild mental retardation is being admitted for COPD exacerbation.  Her caregiver brought her to the emergency department on 23rd of December and was discharged back to group home with steroids and breathing inhalers but continued to get worse and is brought back to the emergency department today.  She reports Nonproductive cough as well. No chest pain. No vomiting, but the patient has had some loose bowel movements over the last 2 days as well. No sick contacts at home. A history of COPD and has been taking Advair as prescribed. Also has a history of lung cancer PAST MEDICAL HISTORY:   Past Medical History:  Diagnosis Date  . COPD (chronic obstructive pulmonary disease) ([place])   . Dementia   . Diabetes mellitus without complication ([place])   . GERD (gastroesophageal reflux disease)   . Hyperlipidemia   . Hypertension   . Lung cancer ([place])   . Mental retardation   . Schizophrenia ([place])     PAST SURGICAL HISTORY:   Past Surgical History:  Procedure Laterality Date  . ABDOMINAL HYSTERECTOMY      SOCIAL HISTORY:   Social History  Substance Use Topics  . Smoking status: Current Some Day Smoker    Packs/day: 0.25    Types: Cigarettes  . Smokeless tobacco: Never Used  . Alcohol use No    FAMILY HISTORY:  History reviewed. No pertinent family history. She is adopted and does not recall any family members.  She lives in a group home DRUG ALLERGIES:  No Known Allergies  REVIEW OF SYSTEMS:   Review of  Systems  Constitutional: Negative for chills, fever and weight loss.  HENT: Negative for nosebleeds and sore throat.   Eyes: Negative for blurred vision.  Respiratory: Positive for cough, shortness of breath and wheezing.   Cardiovascular: Negative for chest pain, orthopnea, leg swelling and PND.  Gastrointestinal: Negative for abdominal pain, constipation, diarrhea, heartburn, nausea and vomiting.  Genitourinary: Negative for dysuria and urgency.  Musculoskeletal: Negative for back pain.  Skin: Negative for rash.  Neurological: Negative for dizziness, speech change, focal weakness and headaches.  Endo/Heme/Allergies: Does not bruise/bleed easily.  Psychiatric/Behavioral: Negative for depression.    MEDICATIONS AT HOME:   Prior to Admission medications   Medication Sig Start Date End Date Taking? Authorizing Provider  acetaminophen (TYLENOL) 325 MG tablet Take 325 mg by mouth daily as needed.    Yes Historical Provider, MD  ALPRAZolam (XANAX) 0.25 MG tablet Take 0.25 mg by mouth 2 (two) times daily.    Yes Historical Provider, MD  amLODipine (NORVASC) 10 MG tablet Take 10 mg by mouth daily.    Yes Historical Provider, MD  aspirin 325 MG tablet Take 325 mg by mouth daily.    Yes Historical Provider, MD  azithromycin (ZITHROMAX Z-PAK) 250 MG tablet Take 2 tablets (500 mg) on  Day 1,  followed by 1 tablet (250 mg) once daily on Days 2 through 5. 01/05/16  Yes Carrie Mew, MD  Dextromethorphan-Quinidine (NUEDEXTA) 20-10  MG CAPS Take 1 capsule by mouth at bedtime.    Yes Historical Provider, MD  ferrous sulfate 325 (65 FE) MG tablet Take by mouth 2 (two) times daily with a meal.    Yes Historical Provider, MD  Fluticasone-Salmeterol (ADVAIR) 250-50 MCG/DOSE AEPB Inhale 1 puff into the lungs 2 (two) times daily.   Yes Historical Provider, MD  lisinopril (PRINIVIL,ZESTRIL) 10 MG tablet Take 10 mg by mouth daily.    Yes Historical Provider, MD  magnesium hydroxide (MILK OF MAGNESIA) 400  MG/5ML suspension Take 40 mLs by mouth daily.    Yes Historical Provider, MD  Memantine HCl-Donepezil HCl (NAMZARIC) 28-10 MG CP24 Take 1 capsule by mouth daily.   Yes Historical Provider, MD  metFORMIN (GLUCOPHAGE) 500 MG tablet Take 500 mg by mouth 2 (two) times daily with a meal.    Yes Historical Provider, MD  modafinil (PROVIGIL) 100 MG tablet Take 100 mg by mouth daily.    Yes Historical Provider, MD  Multiple Vitamin (MULTI-VITAMINS) TABS Take 1 tablet by mouth daily.    Yes Historical Provider, MD  omeprazole (PRILOSEC) 40 MG capsule Take 40 mg by mouth daily.    Yes Historical Provider, MD  perphenazine (TRILAFON) 2 MG tablet Take 2-4 mg by mouth every 6 (six) hours as needed.    Yes Historical Provider, MD  perphenazine (TRILAFON) 4 MG tablet Take 4 mg by mouth 3 (three) times daily.   Yes Historical Provider, MD  predniSONE (DELTASONE) 20 MG tablet Take 2 tablets (40 mg total) by mouth daily. 01/05/16  Yes Carrie Mew, MD  QUEtiapine (SEROQUEL XR) 300 MG 24 hr tablet Take 300 mg by mouth at bedtime.   Yes Historical Provider, MD  simvastatin (ZOCOR) 20 MG tablet Take 20 mg by mouth at bedtime.    Yes Historical Provider, MD  Vitamin D, Ergocalciferol, (DRISDOL) 50000 UNITS CAPS capsule Take 50,000 Units by mouth every 30 (thirty) days.    Yes Historical Provider, MD  albuterol (PROVENTIL HFA) 108 (90 Base) MCG/ACT inhaler Inhale 2 puffs into the lungs every 4 (four) hours as needed for wheezing or shortness of breath. 01/05/16   Carrie Mew, MD  potassium chloride SA (K-DUR,KLOR-CON) 20 MEQ tablet Take 1 tablet (20 mEq total) by mouth daily. Patient not taking: Reported on 01/05/2016 02/07/15   Roxana Hires, MD      VITAL SIGNS:  Blood pressure (!) 147/79, pulse 63, temperature 98.2 F (36.8 C), temperature source Oral, resp. rate 20, height '5\' 6"'$  (1.676 m), weight 76.2 kg (168 lb), SpO2 96 %.  PHYSICAL EXAMINATION:  Physical Exam  GENERAL:  75 y.o.-year-old patient  lying in the bed with no acute distress. Mild mental retardation EYES: Pupils equal, round, reactive to light and accommodation. No scleral icterus. Extraocular muscles intact.  HEENT: Head atraumatic, normocephalic. Oropharynx and nasopharynx clear.  NECK:  Supple, no jugular venous distention. No thyroid enlargement, no tenderness.  LUNGS: Normal breath sounds bilaterally, + wheezing, rales,rhonchi or crepitation. No use of accessory muscles of respiration.  CARDIOVASCULAR: S1, S2 normal. No murmurs, rubs, or gallops.  ABDOMEN: Soft, nontender, nondistended. Bowel sounds present. No organomegaly or mass.  EXTREMITIES: No pedal edema, cyanosis, or clubbing.  NEUROLOGIC: Cranial nerves II through XII are intact. Muscle strength 5/5 in all extremities. Sensation intact. Gait not checked.  PSYCHIATRIC: The patient is alert and oriented x 3.  slow conversation and is slowly speaking and also has difficulty hearing SKIN: No obvious rash, lesion, or ulcer.  LABORATORY PANEL:  CBC  Recent Labs Lab 01/08/16 1420  WBC 7.7  HGB 12.3  HCT 37.6  PLT 282   ------------------------------------------------------------------------------------------------------------------  Chemistries   Recent Labs Lab 01/08/16 1420  NA 136  K 4.5  CL 99*  CO2 27  GLUCOSE 263*  BUN 18  CREATININE 0.92  CALCIUM 8.7*   ------------------------------------------------------------------------------------------------------------------  Cardiac Enzymes No results for input(s): TROPONINI in the last 168 hours. ------------------------------------------------------------------------------------------------------------------  RADIOLOGY:  Dg Chest 2 View  Result Date: 01/08/2016 CLINICAL DATA:  Shortness of breath, history of lung cancer. EXAM: CHEST  2 VIEW COMPARISON:  CT chest and chest radiograph 01/05/2016. FINDINGS: Trachea is midline. Heart size normal. Thoracic aorta is calcified. There are post  treatment changes in the left perihilar region, with possible slight increase in surrounding atelectasis when compared with 01/05/2016. Ill-defined peribronchovascular nodularity seen in the right lung on 01/05/2016 is poorly visualized on current exam. No pleural fluid. IMPRESSION: 1. Posttreatment changes in the left perihilar region with probable slight increase in surrounding atelectasis. No definitive airspace consolidation. 2. Peribronchovascular nodularity in the right lung seen on 01/05/2016 is poorly appreciated on the current exam. Electronically Signed   By: Lorin Picket M.D.   On: 01/08/2016 15:11   IMPRESSION AND PLAN:  75 year old female being admitted for COPD exacerbation  * COPD exacerbation - nebulizer breathing treatment and IV steroids - Oxygen as needed  * Diabetes mellitus - Continue metformin - Add sliding scale insulin - Check hemoglobin A1c  * Schizophrenia, mental retardation - Continue home medications including Seroquel, Xanax, and modafinil  * Hypertension - Continue amlodipine and lisinopril and adjust dose as needed  * Tobacco abuse - Counseled for smoking cessation for about 3 minutes.  Not ready to quit yet - Denies need for any nicotine replacement therapy while in the hospital    All the records are reviewed and case discussed with ED provider. Management plans discussed with the patient, family and they are in agreement.  CODE STATUS: Full code  TOTAL TIME TAKING CARE OF THIS PATIENT: 45 minutes.    Max Sane M.D on 01/08/2016 at 5:23 PM  Between 7am to 6pm - Pager - [phone]  After 6pm go to [email or web address] - Proofreader  [facility]  Office  [phone]  CC: Primary care physician; Ellamae Sia, MD   Note: This dictation was prepared with Dragon dictation along with smaller phrase technology. Any transcriptional errors that result from this process are unintentional.

## 2016-01-08 NOTE — ED Provider Notes (Signed)
Time Seen: Approximately 1421  I have reviewed the triage notes  Chief Complaint: Shortness of Breath   History of Present Illness: Cheryl [name] is a 75 y.o. female who presents with a increased shortness of breath over the last several days. Patient was seen and evaluated here 2 days ago and was diagnosed with exacerbation of her COPD after a chest CT etc. Patient's shortness of breath has continued. She has had a persistent cough which is been dry and nonproductive with no obvious fever. Patient was discharged on oral steroids and oral antibiotic therapy. She does continue to smoke. She does not have any home oxygen.   Past Medical History:  Diagnosis Date  . COPD (chronic obstructive pulmonary disease) ([place])   . Dementia   . Diabetes mellitus without complication ([place])   . GERD (gastroesophageal reflux disease)   . Hyperlipidemia   . Hypertension   . Lung cancer ([place])   . Mental retardation   . Schizophrenia [facility])     Patient Active Problem List   Diagnosis Date Noted  . COPD exacerbation ([place]) 01/08/2016  . Hypokalemia 02/07/2015  . Cancer of left lung ([place]) 07/25/2011    Past Surgical History:  Procedure Laterality Date  . ABDOMINAL HYSTERECTOMY      Past Surgical History:  Procedure Laterality Date  . ABDOMINAL HYSTERECTOMY      Current Outpatient Rx  . Order #: [id number] Class: Historical Med  . Order #: [id number] Class: Historical Med  . Order #: [id number] Class: Historical Med  . Order #: [id number] Class: Historical Med  . Order #: [id number] Class: Print  . Order #: [id number] Class: Historical Med  . Order #: [id number] Class: Historical Med  . Order #: [id number] Class: Historical Med  . Order #: [id number] Class: Historical Med  . Order #: [id number] Class: Historical Med  . Order #: [id number] Class: Historical Med  . Order #: [id number] Class: Historical Med  . Order #: [id number] Class: Historical Med  . Order #: [id number] Class: Historical Med  . Order #:  [id number] Class: Historical Med  . Order #: [id number] Class: Historical Med  . Order #: [id number] Class: Historical Med  . Order #: [id number] Class: Print  . Order #: [id number] Class: Historical Med  . Order #: [id number] Class: Historical Med  . Order #: [id number] Class: Historical Med  . Order #: [id number] Class: Print  . Order #: [id number] Class: Normal    Allergies:  Patient has no known allergies.  Family History: History reviewed. No pertinent family history.  Social History: Social History  Substance Use Topics  . Smoking status: Current Some Day Smoker    Packs/day: 0.25    Types: Cigarettes  . Smokeless tobacco: Never Used  . Alcohol use No     Review of Systems:   10 point review of systems was performed and was otherwise negative:  Constitutional: No fever Eyes: No visual disturbances ENT: No sore throat, ear pain Cardiac: No chest pain Respiratory: Persistent shortness of breath with some audible wheezes. Abdomen: No abdominal pain, no vomiting, No diarrhea Endocrine: No weight loss, No night sweats Extremities: No peripheral edema, cyanosis Skin: No rashes, easy bruising Neurologic: No focal weakness, trouble with speech or swollowing Urologic: No dysuria, Hematuria, or urinary frequency   Physical Exam:  ED Triage Vitals  Enc Vitals Group     BP 01/08/16 1347 (!) 151/58     Pulse Rate 01/08/16 1529 80     Resp 01/08/16 1347 (!) 24     Temp 01/08/16 1347 98.2 F (36.8 C)     Temp Source 01/08/16 1347 Oral  SpO2 01/08/16 1347 93 %     Weight 01/08/16 1348 168 lb (76.2 kg)     Height 01/08/16 1348 '5\' 6"'$  (1.676 m)     Head Circumference --      Peak Flow --      Pain Score 01/08/16 1348 0     Pain Loc --      Pain Edu? --      Excl. in [place]? --     General: Awake , Alert , and Oriented times 2. Audible wheezing at the bedside inspiratory and mildly expiratory. No signs of respiratory distress. Respiratory retractions, etc. Head: Normal cephalic ,  atraumatic Eyes: Pupils equal , round, reactive to light Nose/Throat: No nasal drainage, patent upper airway without erythema or exudate.  Neck: Supple, Full range of motion, No anterior adenopathy or palpable thyroid masses Lungs: Coarse rhonchi auscultated bilaterally at the bases with some end expiratory wheezing at the apices Heart: Regular rate, regular rhythm without murmurs , gallops , or rubs Abdomen: Soft, non tender without rebound, guarding , or rigidity; bowel sounds positive and symmetric in all 4 quadrants. No organomegaly .        Extremities: 2 plus symmetric pulses. No edema, clubbing or cyanosis Neurologic: normal ambulation, Motor symmetric without deficits, sensory intact Skin: warm, dry, no rashes   Labs:   All laboratory work was reviewed including any pertinent negatives or positives listed below:  Labs Reviewed  CBC WITH DIFFERENTIAL/PLATELET - Abnormal; Notable for the following:       Result Value   Neutro Abs 6.9 (*)    Lymphs Abs 0.5 (*)    All other components within normal limits  BASIC METABOLIC PANEL - Abnormal; Notable for the following:    Chloride 99 (*)    Glucose, Bld 263 (*)    Calcium 8.7 (*)    GFR calc non Af Amer 60 (*)    All other components within normal limits    EKG:  ED ECG REPORT I, Daymon Larsen, the attending physician, personally viewed and interpreted this ECG.  Date: 01/08/2016 EKG Time:1354 Rate: 99 Rhythm: normal sinus rhythm with frequent PVCs QRS Axis: normal Intervals: Right bundle-branch block pattern ST/T Wave abnormalities: normal Conduction Disturbances: none Narrative Interpretation: unremarkable No acute ischemic changes   Radiology:  "Dg Chest 2 View  Result Date: 01/08/2016 CLINICAL DATA:  Shortness of breath, history of lung cancer. EXAM: CHEST  2 VIEW COMPARISON:  CT chest and chest radiograph 01/05/2016. FINDINGS: Trachea is midline. Heart size normal. Thoracic aorta is calcified. There are post  treatment changes in the left perihilar region, with possible slight increase in surrounding atelectasis when compared with 01/05/2016. Ill-defined peribronchovascular nodularity seen in the right lung on 01/05/2016 is poorly visualized on current exam. No pleural fluid. IMPRESSION: 1. Posttreatment changes in the left perihilar region with probable slight increase in surrounding atelectasis. No definitive airspace consolidation. 2. Peribronchovascular nodularity in the right lung seen on 01/05/2016 is poorly appreciated on the current exam. Electronically Signed   By: Lorin Picket M.D.   On: 01/08/2016 15:11   Dg Chest 2 View  Result Date: 01/05/2016 CLINICAL DATA:  Shortness of breath, cough EXAM: CHEST  2 VIEW COMPARISON:  CT chest dated 09/05/2015 FINDINGS: Lungs are clear.  No pleural effusion or pneumothorax. The heart is normal in size. Visualized osseous structures are within normal limits. IMPRESSION: No evidence of acute cardiopulmonary disease. Electronically Signed   By: Julian Hy M.D.   On:  01/05/2016 13:06   Ct Angio Chest Pe W And/or Wo Contrast  Result Date: 01/05/2016 CLINICAL DATA:  Shortness of breath for 2 days, wheezing, dry cough, history lung cancer, GERD, diabetes mellitus, hypertension, COPD, smoker EXAM: CT ANGIOGRAPHY CHEST WITH CONTRAST TECHNIQUE: Multidetector CT imaging of the chest was performed using the standard protocol during bolus administration of intravenous contrast. Multiplanar CT image reconstructions and MIPs were obtained to evaluate the vascular anatomy. CONTRAST:  75 cc Isovue 370 IV COMPARISON:  09/05/2015 FINDINGS: Cardiovascular: Atherosclerotic calcifications aorta, proximal great vessels and coronary arteries. Small pericardial fusion. Aorta normal caliber without aneurysm or dissection. Pulmonary arteries well opacified. Suboptimal assessment of pulmonary arterial branches in particularly the lower lobes due to respiratory motion with numerous  resulting artifacts. No definite pulmonary emboli identified. Mediastinum/Nodes: Esophagus contains air throughout its length. No thoracic adenopathy. Base of cervical region unremarkable. Lungs/Pleura: Scattered respiratory motion artifacts. Patchy infiltrates are identified in the RIGHT middle and RIGHT lower lobes, minimally in RIGHT upper lobe. Atelectasis in volume loss in the LEFT upper lobe question related to prior treatment for lung cancer. No definite pulmonary mass or nodule identified. Upper Abdomen: Large hiatal hernia. Stable low-attenuation lesion lateral RIGHT lobe liver 16 mm image 84. Tiny low-attenuation focus more superiorly within the medial segment LEFT lobe image 75 unchanged. Remaining visualized upper abdomen unremarkable. Musculoskeletal: No acute osseous findings. Stable subtle sclerotic focus within the T6 vertebral body. Review of the MIP images confirms the above findings. IMPRESSION: Respiratory motion artifacts limit exam. Numerous scattered motion artifacts at the pulmonary arteries without definite pulmonary emboli identified. Scattered patchy infiltrates in RIGHT lung with areas of scarring in LEFT upper lobe question related to prior treatment for lung cancer. Large hiatal hernia. Aortic atherosclerosis and coronary arterial calcification. Electronically Signed   By: Lavonia Dana M.D.   On: 01/05/2016 14:42  "  I personally reviewed the radiologic studies    ED Course: * Patient received a series of 3 duo nebs here in emergency department. She has had some episodes of hypoxia was placed on a 2 L nasal cannula. Chest x-ray which was repeated shows no indications of community-acquired pneumonia and this seems to be simply an acute exacerbation of chronic obstructive pulmonary disease. Patient was given a bolus of IV Solu-Medrol Repeat auscultations 2 of the lungs show only minimal improvement. Clinical Course      Assessment: * Acute exacerbation of chronic  obstructive pulmonary disease     Plan: * Inpatient            Daymon Larsen, MD 01/08/16 1747

## 2016-01-08 NOTE — ED Notes (Signed)
Patient placed on 2L O2 via [place] for sats at 92%

## 2016-01-08 NOTE — ED Triage Notes (Addendum)
States SOB that began this weekend, pt tachypenic and wheezing upon arrival, states hx of COPD and lung cancer

## 2016-01-09 LAB — BASIC METABOLIC PANEL
Anion gap: 8 (ref 5–15)
BUN: 20 mg/dL (ref 6–20)
CHLORIDE: 102 mmol/L (ref 101–111)
CO2: 33 mmol/L — ABNORMAL HIGH (ref 22–32)
CREATININE: 0.86 mg/dL (ref 0.44–1.00)
Calcium: 9 mg/dL (ref 8.9–10.3)
GFR calc non Af Amer: >60 mL/min (ref >60)
Glucose, Bld: 103 mg/dL — ABNORMAL HIGH (ref 65–99)
Potassium: 3.3 mmol/L — ABNORMAL LOW (ref 3.5–5.1)
SODIUM: 143 mmol/L (ref 135–145)

## 2016-01-09 LAB — CBC
HCT: 37.5 % (ref 35.0–47.0)
Hemoglobin: 12.4 g/dL (ref 12.0–16.0)
MCH: 27.6 pg (ref 26.0–34.0)
MCHC: 33.1 g/dL (ref 32.0–36.0)
MCV: 83.4 fL (ref 80.0–100.0)
PLATELETS: 276 10*3/uL (ref 150–440)
RBC: 4.5 MIL/uL (ref 3.80–5.20)
RDW: 14.2 % (ref 11.5–14.5)
WBC: 5.8 10*3/uL (ref 3.6–11.0)

## 2016-01-09 LAB — GLUCOSE, CAPILLARY: GLUCOSE-CAPILLARY: 106 mg/dL — AB (ref 65–99)

## 2016-01-09 MED ORDER — ENSURE ENLIVE PO LIQD
237.0000 mL | Freq: Two times a day (BID) | ORAL | Status: DC
  Administered 2016-01-09 – 2016-01-10 (×3): 237 mL via ORAL

## 2016-01-09 MED ORDER — POTASSIUM CHLORIDE CRYS ER 20 MEQ PO TBCR
40.0000 meq | EXTENDED_RELEASE_TABLET | Freq: Once | ORAL | Status: AC
  Administered 2016-01-09: 13:00:00 40 meq via ORAL
  Filled 2016-01-09: qty 2

## 2016-01-09 MED FILL — Dextromethorphan HBr-Quinidine Sulfate Cap 20-10 MG: ORAL | Qty: 1 | Status: AC

## 2016-01-09 NOTE — Clinical Social Work Note (Signed)
CSW received consult that patient is from [facility] group home.  CSW to meet with patient and complete assessment at a later time.  Jones Broom. [place], MSW, [place]  Mon-Fri 8a-4:30p 01/09/2016 12:51 PM

## 2016-01-09 NOTE — Care Management (Signed)
Admitted to this facility with the diagnosis of COPD. Lives at [facility] for 5+ years. Son/guardian is Laityn Bensen [phone]), lives in [place]. Owner of Home is Barbie Haggis [phone] or [phone]). Caregiver Emma, at the bedside. No home health. No skilled Facility. No home oxygen. Uses no aids for ambulation, usually independent of all basic needs. No falls. Good appetite. Facility will transport. Shelbie Ammons RN MSN CCM Care Management

## 2016-01-09 NOTE — Progress Notes (Signed)
[facility] NAME: Cheryl [name]    MR#:  [id number]  DATE OF BIRTH:  [birth year]  SUBJECTIVE:  CHIEF COMPLAINT:   Chief Complaint  Patient presents with  . Shortness of Breath  Sleeping comfortably, still wheezing REVIEW OF SYSTEMS:  Review of Systems  Constitutional: Negative for chills, fever and weight loss.  HENT: Negative for nosebleeds and sore throat.   Eyes: Negative for blurred vision.  Respiratory: Positive for cough, shortness of breath and wheezing.   Cardiovascular: Negative for chest pain, orthopnea, leg swelling and PND.  Gastrointestinal: Negative for abdominal pain, constipation, diarrhea, heartburn, nausea and vomiting.  Genitourinary: Negative for dysuria and urgency.  Musculoskeletal: Negative for back pain.  Skin: Negative for rash.  Neurological: Negative for dizziness, speech change, focal weakness and headaches.  Endo/Heme/Allergies: Does not bruise/bleed easily.  Psychiatric/Behavioral: Negative for depression.   DRUG ALLERGIES:  No Known Allergies VITALS:  Blood pressure 139/83, pulse (!) 58, temperature 97.8 F (36.6 C), temperature source Oral, resp. rate 18, height '5\' 4"'$  (1.626 m), weight 82.4 kg (181 lb 9.6 oz), SpO2 93 %. PHYSICAL EXAMINATION:  Physical Exam  Constitutional: She is oriented to person, place, and time and well-developed, well-nourished, and in no distress.  HENT:  Head: Normocephalic and atraumatic.  Eyes: Conjunctivae and EOM are normal. Pupils are equal, round, and reactive to light.  Neck: Normal range of motion. Neck supple. No tracheal deviation present. No thyromegaly present.  Cardiovascular: Normal rate, regular rhythm and normal heart sounds.   Pulmonary/Chest: Effort normal. No respiratory distress. She has wheezes. She exhibits no tenderness.  Abdominal: Soft. Bowel sounds are normal. She exhibits no distension. There is no tenderness.  Musculoskeletal: Normal  range of motion.  Neurological: She is alert and oriented to person, place, and time. No cranial nerve deficit.  Skin: Skin is warm and dry. No rash noted.  Psychiatric: Mood and affect normal.   LABORATORY PANEL:   CBC  Recent Labs Lab 01/09/16 0715  WBC 5.8  HGB 12.4  HCT 37.5  PLT 276   ------------------------------------------------------------------------------------------------------------------ Chemistries   Recent Labs Lab 01/09/16 0715  NA 143  K 3.3*  CL 102  CO2 33*  GLUCOSE 103*  BUN 20  CREATININE 0.86  CALCIUM 9.0   RADIOLOGY:  Dg Chest 2 View  Result Date: 01/08/2016 CLINICAL DATA:  Shortness of breath, history of lung cancer. EXAM: CHEST  2 VIEW COMPARISON:  CT chest and chest radiograph 01/05/2016. FINDINGS: Trachea is midline. Heart size normal. Thoracic aorta is calcified. There are post treatment changes in the left perihilar region, with possible slight increase in surrounding atelectasis when compared with 01/05/2016. Ill-defined peribronchovascular nodularity seen in the right lung on 01/05/2016 is poorly visualized on current exam. No pleural fluid. IMPRESSION: 1. Posttreatment changes in the left perihilar region with probable slight increase in surrounding atelectasis. No definitive airspace consolidation. 2. Peribronchovascular nodularity in the right lung seen on 01/05/2016 is poorly appreciated on the current exam. Electronically Signed   By: Lorin Picket M.D.   On: 01/08/2016 15:11   ASSESSMENT AND PLAN:  75 year old female being admitted for COPD exacerbation  * COPD exacerbation - continue nebulizer breathing treatment and IV steroids - Oxygen as needed, wean as tolerated  * Diabetes mellitus - Continue metformin  * Hypokalemia - Replete and recheck  * Schizophrenia, mental retardation - Continue home medications including Seroquel, Xanax, and modafinil  * Hypertension - Continue amlodipine and lisinopril  and adjust dose  as needed  * Tobacco abuse - Counseled for smoking cessation for about 3 minutes.  Not ready to quit yet - Denies need for any nicotine replacement therapy while in the hospital     All the records are reviewed and case discussed with Care Management/Social Worker. Management plans discussed with the patient, family and they are in agreement.  CODE STATUS: FULL CODE  TOTAL TIME TAKING CARE OF THIS PATIENT: 35 minutes.   More than 50% of the time was spent in counseling/coordination of care: YES  POSSIBLE D/C IN AM, DEPENDING ON CLINICAL CONDITION.   Max Sane M.D on 01/09/2016 at 12:36 PM  Between 7am to 6pm - Pager - [phone]  After 6pm go to [email or web address] - Proofreader  [facility]  Office  [phone]  CC: Primary care physician; Ellamae Sia, MD  Note: This dictation was prepared with Dragon dictation along with smaller phrase technology. Any transcriptional errors that result from this process are unintentional.

## 2016-01-09 NOTE — Progress Notes (Signed)
While rounding, [place] made initial visit to room 101. Pt was alert and indicated she was feeling better. Conversation was a bit difficult. [place] provided the ministry of presence and prayer.    01/09/16 1300  Clinical Encounter Type  Visited With Patient  Visit Type Initial;Spiritual support  Referral From Nurse  Spiritual Encounters  Spiritual Needs Prayer

## 2016-01-09 NOTE — Evaluation (Signed)
Physical Therapy Evaluation Patient Details Name: Cheryl [name] MRN: [id number] DOB: 18-Apr-1940 Today's Date: 01/09/2016   History of Present Illness  presented to ER from group home secondary to progressive SOB; admitted with COPD exacerbation.  Clinical Impression  Upon evaluation, patient alert and oriented to basic information; pleasant and cooperative, follows all commands.  Bilat UE/LE strength and ROM grossly symmetrical and WFL for basic transfers and mobility (difficulty with formal MMT due to baseline cognitive deficits).  Able to complete bed mobility with mod indep; sit/stand, basic transfers and gait (30') without assist device, cga/min assist; mod sway with shuffling gait noted.  Patient generally unsteady and reaching for walls/furniture with all mobility.  Provided RW for additional gait trial, improved all mobility to cga/close sup; patient subjectively reporting optimal comfort/confidence with use of RW at this time.  Will benefit from RW for use upon discharge. Mod SOB with exertion; sats maintained >91% on 2L throughout session.  Will continue to monitor tolerance to activity and assess need for potential home O2 (if goes home with O2, will need training for safety awareness/management of tubing). Would benefit from skilled PT to address above deficits and promote optimal return to PLOF; Recommend transition to [place] upon discharge from acute hospitalization.     Follow Up Recommendations Home health PT    Equipment Recommendations  Rolling walker with 5" wheels    Recommendations for Other Services       Precautions / Restrictions Precautions Precautions: Fall Restrictions Weight Bearing Restrictions: No      Mobility  Bed Mobility Overal bed mobility: Modified Independent                Transfers Overall transfer level: Needs assistance   Transfers: Sit to/from Stand Sit to Stand: Min guard            Ambulation/Gait Ambulation/Gait  assistance: Min assist;Min guard Ambulation Distance (Feet): 30 Feet Assistive device: None       General Gait Details: shuffling steps with mild give in bilat knees, R > L (but no overt buckling).  Increased lateral sway, frequently reaching for walls/furniture for stabilization as needed  Stairs            Wheelchair Mobility    Modified Rankin (Stroke Patients Only)       Balance Overall balance assessment: Needs assistance Sitting-balance support: No upper extremity supported;Feet supported Sitting balance-Leahy Scale: Good     Standing balance support: No upper extremity supported Standing balance-Leahy Scale: Fair                               Pertinent Vitals/Pain Pain Assessment: No/denies pain    Home Living Family/patient expects to be discharged to:: Group home                 Additional Comments: Patient able to provide limited information about group home environment    Prior Function Level of Independence: Independent         Comments: Per notes, and patient conversation, indep with ADLs, household mobility without use of assist device; no home O2.     Hand Dominance        Extremity/Trunk Assessment   Upper Extremity Assessment Upper Extremity Assessment: Overall WFL for tasks assessed    Lower Extremity Assessment Lower Extremity Assessment: Overall WFL for tasks assessed (grossly at least 4-/5, difficulty with formal MMT due to cognitive deficits)  Communication   Communication:  (somewhat garbled and hypophonic, repetitive in answers and conversation)  Cognition Arousal/Alertness: Awake/alert Behavior During Therapy: WFL for tasks assessed/performed Overall Cognitive Status: History of cognitive impairments - at baseline                      General Comments      Exercises Other Exercises Other Exercises: 92' with RW, cga/close sup-improved step height/length and overall fluidity of gait  pattern.  Greater ease and confidence of movement with use of RW; patient subjectively prefers use at this time for optimal safety/stability.   Assessment/Plan    PT Assessment Patient needs continued PT services  PT Problem List Decreased strength;Decreased range of motion;Decreased activity tolerance;Decreased balance;Decreased mobility;Decreased cognition;Decreased safety awareness;Decreased knowledge of precautions;Cardiopulmonary status limiting activity          PT Treatment Interventions DME instruction;Gait training;Functional mobility training;Therapeutic activities;Therapeutic exercise;Balance training;Patient/family education    PT Goals (Current goals can be found in the Care Plan section)  Acute Rehab PT Goals Patient Stated Goal: to return to group home, to keep using RW PT Goal Formulation: With patient Time For Goal Achievement: 01/23/16 Potential to Achieve Goals: Good    Frequency Min 2X/week   Barriers to discharge Decreased caregiver support      Co-evaluation               End of Session Equipment Utilized During Treatment: Gait belt;Oxygen Activity Tolerance: Patient tolerated treatment well Patient left: in bed;with call bell/phone within reach;with bed alarm set Nurse Communication: Mobility status         Time: 7847-8412 PT Time Calculation (min) (ACUTE ONLY): 15 min   Charges:   PT Evaluation $PT Eval Low Complexity: 1 Procedure PT Treatments $Gait Training: 8-22 mins   PT G Codes:        Amilyah Nack H. Owens Shark, PT, DPT, NCS 01/09/16, 11:05 AM [phone]

## 2016-01-09 NOTE — Care Management Important Message (Signed)
Important Message  Patient Details  Name: Cheryl [name] MRN: [id number] Date of Birth: 07/11/1940   Medicare Important Message Given:  Yes    Shelbie Ammons, RN 01/09/2016, 8:37 AM

## 2016-01-09 NOTE — Progress Notes (Signed)
Initial Nutrition Assessment  DOCUMENTATION CODES:   Obesity unspecified  INTERVENTION:   Ensure Enlive po BID, each supplement provides 350 kcal and 20 grams of protein  NUTRITION DIAGNOSIS:   Increased nutrient needs related to COPD and h/o cancer as evidenced by increased estimated needs from protein and energy.   GOAL:   Patient will meet greater than or equal to 90% of their needs  MONITOR:   PO intake, Supplement acceptance  REASON FOR ASSESSMENT:   Consult Assessment of nutrition requirement/status  ASSESSMENT:   75 y.o. female with a known history of COPD, dementia, hypertension, and mild mental retardation is being admitted for COPD exacerbation.   Met with pt in room today. Pt is poor historian with h/o mental retardation. Spoke to RN; pt ate 100% breakfast this morning. Per chart stable weights. Will order Ensure to help maintain lean muscle mass in setting of COPD and h/o lung cancer.   Medications reviewed and include: aspirin, colace, heparin, MVI, protonix, Vit D  Labs reviewed: Cl 99(L), Ca 8.7(L)  Nutrition-Focused physical exam completed. Findings are no fat depletion, no muscle depletion, and no edema.   Diet Order:  Diet Heart Room service appropriate? Yes; Fluid consistency: Thin  Skin:  Reviewed, no issues  Last BM:  12/26  Height:   Ht Readings from Last 1 Encounters:  01/08/16 _0  (1.626 m)    Weight:   Wt Readings from Last 1 Encounters:  01/09/16 181 lb 9.6 oz (82.4 kg)    Ideal Body Weight:  54.5 kg  BMI:  Body mass index is 31.17 kg/m.  Estimated Nutritional Needs:   Kcal:  1500-1700kcal/day   Protein:  90-107g/day   Fluid:  >1.4L/day   EDUCATION NEEDS:   No education needs identified at this time  Koleen Distance, RD, LDN Pager #[phone]

## 2016-01-09 NOTE — Evaluation (Signed)
Occupational Therapy Evaluation Patient Details Name: Cheryl [name] MRN: [id number] DOB: Jul 22, 1940 Today's Date: 01/09/2016    History of Present Illness Pt. is a 75 y.o. female who was admitted to [facility] with a COPD exacerbation.   Clinical Impression   Pt. Is a 75 y.o. Female who was admitted to [facility] with a COPD exacerbation. Pt. Presents with weakness, decreased activity tolerance, and decreased functional mobility which hinder her ability to complete ADL functioning. Pt. Could benefit from skilled OT services for ADL training, A/E training, and pt. Education about energy conservation/work simplification strategies. Pt. plans to go back to the group home with care assist as needed. Pt. education was provided about energy conservation. A handout was provided.    Follow Up Recommendations  No OT follow up    Equipment Recommendations       Recommendations for Other Services       Precautions / Restrictions Precautions Precautions: Fall Restrictions Weight Bearing Restrictions: No         Balance Overall balance assessment: Needs assistance Sitting-balance support: No upper extremity supported;Feet supported Sitting balance-Leahy Scale: Good     Standing balance support: No upper extremity supported Standing balance-Leahy Scale: Fair                              ADL  self-feeding: set-up  Pt. education was provided about energy conservation/work simplification techniques.                                             Vision     Perception     Praxis      Pertinent Vitals/Pain Pain Assessment: No/denies pain     Hand Dominance Right   Extremity/Trunk Assessment Upper Extremity Assessment Upper Extremity Assessment: Difficult to assess due to impaired cognition         Communication Communication Communication:  (Speech garbled at times.)   Cognition Arousal/Alertness: Awake/alert Behavior During Therapy: WFL for  tasks assessed/performed Overall Cognitive Status: History of cognitive impairments - at baseline                     General Comments       Exercises   Shoulder Instructions      Home Living Family/patient expects to be discharged to:: Group home Living Arrangements: Group Home Available Help at Discharge: Available 24 hours/day;Personal care attendant Type of Home: Group Home Home Access: Stairs to enter Entrance Stairs-Number of Steps: 4   Home Layout: One level     Bathroom Shower/Tub: Tub/shower unit;Curtain Shower/tub characteristics: Architectural technologist: Standard     Home Equipment: None   Additional Comments: Rep. from [place] present      Prior Functioning/Environment Level of Independence: Independent        Comments: Per notes, and patient conversation, indep with ADLs, household mobility without use of assist device; no home O2.        OT Problem List: Decreased strength;Pain;Decreased activity tolerance;Decreased safety awareness;Decreased knowledge of use of DME or AE   OT Treatment/Interventions: Self-care/ADL training;Energy conservation;DME and/or AE instruction;Therapeutic activities;Patient/family education    OT Goals(Current goals can be found in the care plan section) Acute Rehab OT Goals Patient Stated Goal: To return to the group home OT Goal Formulation: With patient Potential to Achieve Goals: Good  OT Frequency: Min 1X/week   Barriers to D/C:            Co-evaluation              End of Session    Activity Tolerance: Patient tolerated treatment well Patient left: in bed;with call bell/phone within reach;with bed alarm set   Time: 1340-1400 OT Time Calculation (min): 20 min Charges:  OT General Charges $OT Visit: 1 Procedure OT Evaluation $OT Eval Moderate Complexity: 1 Procedure G-Codes:    Harrel Carina, MS, OTR/L 01/09/2016, 2:19 PM

## 2016-01-09 NOTE — Clinical Social Work Note (Signed)
CSW acknowledge receiving consult for patient from [facility] group home.  Patient states she has been there for awhile and she would like to return back to group home.  Patient said she enjoys living there and they treat her well.  CSW to contact group home to facilitate discharge planning, formal assessment to follow.  Jones Broom. [place], MSW, [place]  Mon-Fri 8a-4:30p 01/09/2016 6:03 PM

## 2016-01-10 LAB — GLUCOSE, CAPILLARY: Glucose-Capillary: 97 mg/dL (ref 65–99)

## 2016-01-10 MED ORDER — PREDNISONE 10 MG (21) PO TBPK: 10.0000 mg | ORAL_TABLET | Freq: Every day | ORAL | 0 refills | Status: DC

## 2016-01-10 NOTE — Care Management (Signed)
Confirmed with primary nurse that patient has not qualified for continuous home 02.  Confirmed with caregiver that patient has a home nebulizer.

## 2016-01-10 NOTE — Care Management (Signed)
patient for discharge back to [facility] today.  Obtained order for home health nurse, physical therapy and rolling walker.  notified [facility] as there is not an agency preference.

## 2016-01-10 NOTE — Discharge Instructions (Signed)
Chronic Obstructive Pulmonary Disease Chronic obstructive pulmonary disease (COPD) is a common lung problem. In COPD, the flow of air from the lungs is limited. The way your lungs work will probably never return to normal, but there are things you can do to improve your lungs and make yourself feel better. Your doctor may treat your condition with:  Medicines.  Oxygen.  Lung surgery.  Changes to your diet.  Rehabilitation. This may involve a team of specialists. Follow these instructions at home:  Take all medicines as told by your doctor.  Avoid medicines or cough syrups that dry up your airway (such as antihistamines) and do not allow you to get rid of thick spit. You do not need to avoid them if told differently by your doctor.  If you smoke, stop. Smoking makes the problem worse.  Avoid being around things that make your breathing worse (like smoke, chemicals, and fumes).  Use oxygen therapy and therapy to help improve your lungs (pulmonary rehabilitation) if told by your doctor. If you need home oxygen therapy, ask your doctor if you should buy a tool to measure your oxygen level (oximeter).  Avoid people who have a sickness you can catch (contagious).  Avoid going outside when it is very hot, cold, or humid.  Eat healthy foods. Eat smaller meals more often. Rest before meals.  Stay active, but remember to also rest.  Make sure to get all the shots (vaccines) your doctor recommends. Ask your doctor if you need a pneumonia shot.  Learn and use tips on how to relax.  Learn and use tips on how to control your breathing as told by your doctor. Try: 1. Breathing in (inhaling) through your nose for 1 second. Then, pucker your lips and breath out (exhale) through your lips for 2 seconds. 2. Putting one hand on your belly (abdomen). Breathe in slowly through your nose for 1 second. Your hand on your belly should move out. Pucker your lips and breathe out slowly through your lips.  Your hand on your belly should move in as you breathe out.  Learn and use controlled coughing to clear thick spit from your lungs. The steps are: 1. Lean your head a little forward. 2. Breathe in deeply. 3. Try to hold your breath for 3 seconds. 4. Keep your mouth slightly open while coughing 2 times. 5. Spit any thick spit out into a tissue. 6. Rest and do the steps again 1 or 2 times as needed. Contact a doctor if:  You cough up more thick spit than usual.  There is a change in the color or thickness of the spit.  It is harder to breathe than usual.  Your breathing is faster than usual. Get help right away if:  You have shortness of breath while resting.  You have shortness of breath that stops you from:  Being able to talk.  Doing normal activities.  You chest hurts for longer than 5 minutes.  Your skin color is more blue than usual.  Your pulse oximeter shows that you have low oxygen for longer than 5 minutes. This information is not intended to replace advice given to you by your health care provider. Make sure you discuss any questions you have with your health care provider. Document Released: 06/18/2007 Document Revised: 06/07/2015 Document Reviewed: 08/26/2012 Elsevier Interactive Patient Education  2017 [employer].

## 2016-01-10 NOTE — Discharge Summary (Signed)
[place] [facility] [place] NAME: Cheryl [name]    MR#:  [id number]  DATE OF BIRTH:  11-16-1940  DATE OF ADMISSION:  01/08/2016   ADMITTING PHYSICIAN: Max Sane, MD  DATE OF DISCHARGE: 01/10/2016  PRIMARY CARE PHYSICIAN: Letta Median, MD   ADMISSION DIAGNOSIS:  sob DISCHARGE DIAGNOSIS:  Active Problems:   COPD exacerbation ([place])  SECONDARY DIAGNOSIS:   Past Medical History:  Diagnosis Date  . COPD (chronic obstructive pulmonary disease) ([place])   . Dementia   . Diabetes mellitus without complication ([place])   . GERD (gastroesophageal reflux disease)   . Hyperlipidemia   . Hypertension   . Lung cancer ([place])   . Mental retardation   . Schizophrenia [facility])    HOSPITAL COURSE:  75 year old female being admitted for COPD exacerbation  * COPD exacerbation - improved with nebulizer breathing treatment and IV steroids  * Diabetes mellitus - Continue metformin  * Hypokalemia - Repleted  * Schizophrenia, mental retardation - Continue home medications including Seroquel, Xanax, and modafinil  * Hypertension - Continue amlodipine and lisinopril  * Tobacco abuse - Counseledfor smoking cessation for about 3 minutes. Not ready to quit yet - Denies need for any nicotine replacement therapy while in the hospital  DISCHARGE CONDITIONS:  stable CONSULTS OBTAINED:   DRUG ALLERGIES:  No Known Allergies DISCHARGE MEDICATIONS:   Allergies as of 01/10/2016   No Known Allergies     Medication List    STOP taking these medications   azithromycin 250 MG tablet Commonly known as:  ZITHROMAX Z-PAK   potassium chloride SA 20 MEQ tablet Commonly known as:  K-DUR,KLOR-CON   predniSONE 20 MG tablet Commonly known as:  DELTASONE Replaced by:  predniSONE 10 MG (21) Tbpk tablet     TAKE these medications   acetaminophen 325 MG tablet Commonly known as:  TYLENOL Take 325 mg by mouth daily as needed.   albuterol 108  (90 Base) MCG/ACT inhaler Commonly known as:  PROVENTIL HFA Inhale 2 puffs into the lungs every 4 (four) hours as needed for wheezing or shortness of breath.   ALPRAZolam 0.25 MG tablet Commonly known as:  XANAX Take 0.25 mg by mouth 2 (two) times daily.   amLODipine 10 MG tablet Commonly known as:  NORVASC Take 10 mg by mouth daily.   aspirin 325 MG tablet Take 325 mg by mouth daily.   ferrous sulfate 325 (65 FE) MG tablet Take by mouth 2 (two) times daily with a meal.   Fluticasone-Salmeterol 250-50 MCG/DOSE Aepb Commonly known as:  ADVAIR Inhale 1 puff into the lungs 2 (two) times daily.   GLUCOPHAGE 500 MG tablet Generic drug:  metFORMIN Take 500 mg by mouth 2 (two) times daily with a meal.   lisinopril 10 MG tablet Commonly known as:  PRINIVIL,ZESTRIL Take 10 mg by mouth daily.   magnesium hydroxide 400 MG/5ML suspension Commonly known as:  MILK OF MAGNESIA Take 40 mLs by mouth daily.   modafinil 100 MG tablet Commonly known as:  PROVIGIL Take 100 mg by mouth daily.   MULTI-VITAMINS Tabs Take 1 tablet by mouth daily.   NAMZARIC 28-10 MG Cp24 Generic drug:  Memantine HCl-Donepezil HCl Take 1 capsule by mouth daily.   NUEDEXTA 20-10 MG Caps Generic drug:  Dextromethorphan-Quinidine Take 1 capsule by mouth at bedtime.   omeprazole 40 MG capsule Commonly known as:  PRILOSEC Take 40 mg by mouth daily.   perphenazine 2 MG tablet Commonly known  as:  TRILAFON Take 2-4 mg by mouth every 6 (six) hours as needed.   perphenazine 4 MG tablet Commonly known as:  TRILAFON Take 4 mg by mouth 3 (three) times daily.   predniSONE 10 MG (21) Tbpk tablet Commonly known as:  STERAPRED UNI-PAK 21 TAB Take 1 tablet (10 mg total) by mouth daily. Start 60 mg daily, taper 10 mg daily until done Replaces:  predniSONE 20 MG tablet   QUEtiapine 300 MG 24 hr tablet Commonly known as:  SEROQUEL XR Take 300 mg by mouth at bedtime.   simvastatin 20 MG tablet Commonly known  as:  ZOCOR Take 20 mg by mouth at bedtime.   Vitamin D (Ergocalciferol) 50000 units Caps capsule Commonly known as:  DRISDOL Take 50,000 Units by mouth every 30 (thirty) days.            Durable Medical Equipment        Start     Ordered   01/10/16 [phone]  For home use only DME Walker rolling  Once    Question:  Patient needs a walker to treat with the following condition  Answer:  Balance disorder   01/10/16 0924       DISCHARGE INSTRUCTIONS:   DIET:  Regular diet DISCHARGE CONDITION:  Good ACTIVITY:  Activity as tolerated OXYGEN:  Home Oxygen: No.  Oxygen Delivery: room air DISCHARGE LOCATION:  Group home   If you experience worsening of your admission symptoms, develop shortness of breath, life threatening emergency, suicidal or homicidal thoughts you must seek medical attention immediately by calling 911 or calling your MD immediately  if symptoms less severe.  You Must read complete instructions/literature along with all the possible adverse reactions/side effects for all the Medicines you take and that have been prescribed to you. Take any new Medicines after you have completely understood and accpet all the possible adverse reactions/side effects.   Please note  You were cared for by a hospitalist during your hospital stay. If you have any questions about your discharge medications or the care you received while you were in the hospital after you are discharged, you can call the unit and asked to speak with the hospitalist on call if the hospitalist that took care of you is not available. Once you are discharged, your primary care physician will handle any further medical issues. Please note that NO REFILLS for any discharge medications will be authorized once you are discharged, as it is imperative that you return to your primary care physician (or establish a relationship with a primary care physician if you do not have one) for your aftercare needs so that they can  reassess your need for medications and monitor your lab values.    On the day of Discharge:  VITAL SIGNS:  Blood pressure 139/86, pulse 63, temperature 98.2 F (36.8 C), temperature source Oral, resp. rate 18, height '5\' 4"'$  (1.626 m), weight 84.5 kg (186 lb 4.8 oz), SpO2 93 %. PHYSICAL EXAMINATION:  GENERAL:  75 y.o.-year-old patient lying in the bed with no acute distress. Mild Mental retardation at her baseline EYES: Pupils equal, round, reactive to light and accommodation. No scleral icterus. Extraocular muscles intact.  HEENT: Head atraumatic, normocephalic. Oropharynx and nasopharynx clear.  NECK:  Supple, no jugular venous distention. No thyroid enlargement, no tenderness.  LUNGS: Normal breath sounds bilaterally, no wheezing, rales,rhonchi or crepitation. No use of accessory muscles of respiration.  CARDIOVASCULAR: S1, S2 normal. No murmurs, rubs, or gallops.  ABDOMEN: Soft, non-tender,  non-distended. Bowel sounds present. No organomegaly or mass.  EXTREMITIES: No pedal edema, cyanosis, or clubbing.  NEUROLOGIC: Cranial nerves II through XII are intact. Muscle strength 5/5 in all extremities. Sensation intact. Gait not checked.  PSYCHIATRIC: The patient is alert and oriented x 3.  SKIN: No obvious rash, lesion, or ulcer.  DATA REVIEW:   CBC  Recent Labs Lab 01/09/16 0715  WBC 5.8  HGB 12.4  HCT 37.5  PLT 276    Chemistries   Recent Labs Lab 01/09/16 0715  NA 143  K 3.3*  CL 102  CO2 33*  GLUCOSE 103*  BUN 20  CREATININE 0.86  CALCIUM 9.0     Follow-up Information    Letta Median, MD. Go on 01/24/2016.   Specialty:  Family Medicine Why:  '@1'$ :00 PM PLEASE BRING IN MAILED PAPER WORK COMPLETED  Contact information: [place] [address] [place].   Why:  Rolling walker, home health nurse and physical therapy Contact information: [address] [place]  [address] [phone]            Management plans discussed with the patient, family and they are in agreement.  CODE STATUS: FULL CODE   TOTAL TIME TAKING CARE OF THIS PATIENT: 45 minutes.    Max Sane M.D on 01/10/2016 at 11:46 AM  Between 7am to 6pm - Pager - [phone]  After 6pm go to [email or web address] - Proofreader  [facility]  Office  [phone]  CC: Primary care physician; Letta Median, MD   Note: This dictation was prepared with Dragon dictation along with smaller phrase technology. Any transcriptional errors that result from this process are unintentional.

## 2016-01-10 NOTE — Plan of Care (Signed)
Problem: Education: Goal: Knowledge of Cheryl [name] General Education information/materials will improve Outcome: Progressing No complaints overnight.  Call bell within reach, [place].

## 2016-01-10 NOTE — Clinical Social Work Note (Signed)
Patient to be d/c'ed today to [facility].  Patient and family agreeable to plans will transport via group home transportation.  Discharge summary and FL2 was given to patient's caregiver.  Evette Cristal, MSW, LCSWA Mon-Fri 8a-4:30p [phone]

## 2016-01-10 NOTE — Clinical Social Work Note (Signed)
Clinical Social Work Assessment  Patient Details  Name: Cheryl [name] MRN: [id number] Date of Birth: 1940-11-23  Date of referral:  01/10/16               Reason for consult:  Facility Placement                Permission sought to share information with:  Facility Sport and exercise psychologist, Family Supports Permission granted to share information::  Yes, Verbal Permission Granted  Name::     Juliette Alcide [phone]   Agency::  Group Home  Relationship::     Contact Information:     Housing/Transportation Living arrangements for the past 2 months:  [place] of Information:  Patient Patient Interpreter Needed:  None Criminal Activity/Legal Involvement Pertinent to Current Situation/Hospitalization:  No - Comment as needed Significant Relationships:  Other(Comment) Lives with:  Facility Resident Do you feel safe going back to the place where you live?  Yes Need for family participation in patient care:  No (Coment)  Care giving concerns:  Patient does not have any concerns about returning back to group home.   Social Worker assessment / plan:  Patient is a 75 year old female who is alert and oriented x4.  Patient is able to express her feelings, patient states that she has been at [facility] for several years and she has been happy with the care that has been provided.  Patient states she has some roommates but she gets along well with them.  Patient stated she would like to return back to group home.  Patient did not express any other issues or concerns.  Patient gave CSW permission to contact group home to facilitate discharge planning.  Employment status:  Retired Forensic scientist:  Medicare PT Recommendations:  Home with [place] / Referral to community resources:     Patient/Family's Response to care:  Patient in agreement to returning back to group home.  Patient/Family's Understanding of and Emotional Response to Diagnosis, Current  Treatment, and Prognosis:  Patient is aware of current treatment plan and prognosis.  Emotional Assessment Appearance:  Appears stated age Attitude/Demeanor/Rapport:    Affect (typically observed):  Appropriate, Calm Orientation:  Oriented to Self, Oriented to Place, Oriented to  Time, Oriented to Situation Alcohol / Substance use:  Not Applicable Psych involvement (Current and /or in the community):  No (Comment)  Discharge Needs  Concerns to be addressed:  No discharge needs identified Readmission within the last 30 days:  No Current discharge risk:  None Barriers to Discharge:   None   Ross Ludwig, LCSWA 01/10/2016, 9:04 AM

## 2016-01-10 NOTE — Progress Notes (Signed)
Pt will be discharged today, discharge instructions reviewed with caregiver, she verified understanding. Iv x1 removed. All belongings returned. She will be transported by her caregiver, she will be rolled out in wheelchair by staff.

## 2016-01-10 NOTE — NC FL2 (Signed)
[place] LEVEL OF CARE SCREENING TOOL     IDENTIFICATION  Patient Name: Cheryl [name] Birthdate: [birth year] Sex: female Admission Date (Current Location): 01/08/2016  [place] and [place] Number:  Selena Lesser [id number] [place] and Address:  [facility], [address], [place], [place] [address]      Provider Number: [id number]  Attending Physician Name and Address:  Max Sane, MD  Relative Name and Phone Number:  Juliette Alcide [phone] Caregiver    Current Level of Care: Hospital Recommended Level of Care: Other (Comment) ([facility]) Prior Approval Number:    Date Approved/Denied:   PASRR Number: [id number]  Discharge Plan: Other (Comment) (Group home)    Current Diagnoses: Patient Active Problem List   Diagnosis Date Noted  . COPD exacerbation ([place]) 01/08/2016  . Hypokalemia 02/07/2015  . Cancer of left lung ([place]) 07/25/2011    Orientation RESPIRATION BLADDER Height & Weight     Self, Time, Situation, Place  Normal Continent Weight: 186 lb 4.8 oz (84.5 kg) Height:  '5\' 4"'$  (162.6 cm)  BEHAVIORAL SYMPTOMS/MOOD NEUROLOGICAL BOWEL NUTRITION STATUS      Continent Diet (Cardiac)  AMBULATORY STATUS COMMUNICATION OF NEEDS Skin   Supervision Verbally Normal                       Personal Care Assistance Level of Assistance  Bathing, Feeding, Dressing Bathing Assistance: Limited assistance Feeding assistance: Independent Dressing Assistance: Limited assistance     Functional Limitations Info  Sight, Hearing, Speech Sight Info: Adequate Hearing Info: Adequate Speech Info: Adequate    SPECIAL CARE FACTORS FREQUENCY  PT (By licensed PT), OT (By licensed OT)     PT Frequency: minimum 2x a week home health OT Frequency: minimum 2x a week home health            Contractures Contractures Info: Not present    Additional Factors Info  Code Status, Allergies, Psychotropic Code Status Info:  Full Allergies Info: NKA Psychotropic Info: ALPRAZolam (XANAX) tablet 0.25 mg and QUEtiapine (SEROQUEL XR) 24 hr tablet 300 mg         Current Medications (01/10/2016):  This is the current hospital active medication list Current Facility-Administered Medications  Medication Dose Route Frequency Provider Last Rate Last Dose  . acetaminophen (TYLENOL) tablet 650 mg  650 mg Oral Q6H PRN Max Sane, MD       Or  . acetaminophen (TYLENOL) suppository 650 mg  650 mg Rectal Q6H PRN Max Sane, MD      . albuterol (PROVENTIL) (2.5 MG/3ML) 0.083% nebulizer solution 2.5 mg  2.5 mg Inhalation Q4H PRN Max Sane, MD   2.5 mg at 01/09/16 2321  . ALPRAZolam Duanne Moron) tablet 0.25 mg  0.25 mg Oral BID Max Sane, MD   0.25 mg at 01/10/16 0811  . amLODipine (NORVASC) tablet 10 mg  10 mg Oral Daily Max Sane, MD   10 mg at 01/10/16 0811  . aspirin tablet 325 mg  325 mg Oral Daily Max Sane, MD   325 mg at 01/10/16 3662  . bisacodyl (DULCOLAX) EC tablet 5 mg  5 mg Oral Daily PRN Max Sane, MD      . Dextromethorphan-Quinidine 20-10 MG CAPS 1 capsule  1 capsule Oral QHS Max Sane, MD   1 capsule at 01/09/16 2211  . docusate sodium (COLACE) capsule 100 mg  100 mg Oral BID Max Sane, MD   100 mg at 01/10/16 0811  . memantine (  NAMENDA XR) 24 hr capsule 28 mg  28 mg Oral QHS Max Sane, MD   28 mg at 01/09/16 2211   And  . donepezil (ARICEPT) tablet 10 mg  10 mg Oral QHS Max Sane, MD   10 mg at 01/09/16 2211  . feeding supplement (ENSURE ENLIVE) (ENSURE ENLIVE) liquid 237 mL  237 mL Oral BID BM Vipul Shah, MD   237 mL at 01/09/16 1638  . heparin injection 5,000 Units  5,000 Units Subcutaneous Q8H Max Sane, MD   5,000 Units at 01/10/16 7579  . Influenza vac split quadrivalent PF (FLUARIX) injection 0.5 mL  0.5 mL Intramuscular Tomorrow-1000 Vipul Shah, MD      . lisinopril (PRINIVIL,ZESTRIL) tablet 10 mg  10 mg Oral Daily Max Sane, MD   10 mg at 01/10/16 0811  . magnesium hydroxide (MILK OF MAGNESIA)  suspension 40 mL  40 mL Oral Daily PRN Max Sane, MD      . metFORMIN (GLUCOPHAGE) tablet 500 mg  500 mg Oral BID WC Max Sane, MD   500 mg at 01/10/16 0811  . modafinil (PROVIGIL) tablet 100 mg  100 mg Oral Daily Max Sane, MD   100 mg at 01/10/16 0811  . mometasone-formoterol (DULERA) 200-5 MCG/ACT inhaler 2 puff  2 puff Inhalation BID Max Sane, MD   2 puff at 01/09/16 2042  . multivitamin with minerals tablet 1 tablet  1 tablet Oral Daily Max Sane, MD   1 tablet at 01/10/16 [phone]  . ondansetron (ZOFRAN) tablet 4 mg  4 mg Oral Q6H PRN Max Sane, MD       Or  . ondansetron (ZOFRAN) injection 4 mg  4 mg Intravenous Q6H PRN Vipul Shah, MD      . pantoprazole (PROTONIX) EC tablet 40 mg  40 mg Oral Daily Max Sane, MD   40 mg at 01/10/16 0811  . perphenazine (TRILAFON) tablet 4 mg  4 mg Oral TID Max Sane, MD   4 mg at 01/10/16 0811  . QUEtiapine (SEROQUEL XR) 24 hr tablet 300 mg  300 mg Oral QHS Max Sane, MD   300 mg at 01/09/16 2211  . simvastatin (ZOCOR) tablet 20 mg  20 mg Oral QHS Max Sane, MD   20 mg at 01/09/16 2211  . traZODone (DESYREL) tablet 25 mg  25 mg Oral QHS PRN Max Sane, MD      . Vitamin D (Ergocalciferol) (DRISDOL) capsule 50,000 Units  50,000 Units Oral Q30 days Max Sane, MD   50,000 Units at 01/08/16 2109     Discharge Medications: Please see discharge summary for a list of discharge medications.  Relevant Imaging Results:  Relevant Lab Results:   Additional Information SSN [id number]  Ross Ludwig, [place]

## 2016-01-17 ENCOUNTER — Ambulatory Visit (INDEPENDENT_AMBULATORY_CARE_PROVIDER_SITE_OTHER): Payer: Medicare Other | Admitting: Pulmonary Disease

## 2016-01-17 ENCOUNTER — Encounter: Payer: Self-pay | Admitting: Pulmonary Disease

## 2016-01-17 VITALS — BP 122/88 | HR 53 | Wt 187.0 lb

## 2016-01-17 DIAGNOSIS — J441 Chronic obstructive pulmonary disease with (acute) exacerbation: Secondary | ICD-10-CM | POA: Diagnosis not present

## 2016-01-17 DIAGNOSIS — Z85118 Personal history of other malignant neoplasm of bronchus and lung: Secondary | ICD-10-CM

## 2016-01-17 DIAGNOSIS — F17200 Nicotine dependence, unspecified, uncomplicated: Secondary | ICD-10-CM

## 2016-01-17 DIAGNOSIS — J449 Chronic obstructive pulmonary disease, unspecified: Secondary | ICD-10-CM | POA: Diagnosis not present

## 2016-01-17 NOTE — Patient Instructions (Signed)
Continue Advair inhaler - one inhalation twice a day Continue albuterol inhaler (Pro-air) as needed Stop Smoking!!!  Follow up in 3-4 months

## 2016-01-17 NOTE — Progress Notes (Signed)
PULMONARY CONSULT/POST HOSPITAL NOTE  Requesting MD/Service: Max Sane, MD Date of initial consultation: 01/17/2016 Reason for consultation: COPD   PT PROFILE: 76 y.o. F smoker with cognitive impairment (lives in group home) hospitalized 12/26-12/28/17 for COPD exacerbation under care of hospitalist service. Remote history of lung cancer treated > 5 yrs  DATA: CT chest 01/05/16: Numerous scattered motion artifacts at the pulmonary arteries without definite pulmonary emboli identified. Scattered patchy infiltrates in R lung with areas of scarring in LUL question related to prior treatment for lung cancer. Large hiatal hernia  HPI:  76 yo F resident of group home with severe cognitive impairment and with remote history of lung cancer treated only with radiation therapy (I have very limited details re: this). She continues to smoker 7-8 cigs per day. She was admitted 12/26-12/28 with AECOPD and cared for by the [facility]. The discharge summary has been reviewed. The patient is unable to provide many details of that hospitalization. She is accompanied by an Environmental consultant from the groups home [facility]) who indicates that the patient has chronic rattling cough. She is sedentary and has minimal DOE. She denies CP, fever, purulent sputum, hemoptysis, LE edema and calf tenderness.   Past Medical History:  Diagnosis Date  . COPD (chronic obstructive pulmonary disease) ([place])   . Dementia   . Diabetes mellitus without complication ([place])   . GERD (gastroesophageal reflux disease)   . Hyperlipidemia   . Hypertension   . Lung cancer ([place])   . Mental retardation   . Schizophrenia [facility])     Past Surgical History:  Procedure Laterality Date  . ABDOMINAL HYSTERECTOMY      MEDICATIONS:  Current Outpatient Prescriptions on File Prior to Visit  Medication Sig Dispense Refill  . acetaminophen (TYLENOL) 325 MG tablet Take 325 mg by mouth daily as needed.     Marland Kitchen albuterol (PROVENTIL HFA) 108 (90  Base) MCG/ACT inhaler Inhale 2 puffs into the lungs every 4 (four) hours as needed for wheezing or shortness of breath. 1 Inhaler 0  . ALPRAZolam (XANAX) 0.25 MG tablet Take 0.25 mg by mouth 2 (two) times daily.     Marland Kitchen amLODipine (NORVASC) 10 MG tablet Take 10 mg by mouth daily.     Marland Kitchen aspirin 325 MG tablet Take 325 mg by mouth daily.     Marland Kitchen Dextromethorphan-Quinidine (NUEDEXTA) 20-10 MG CAPS Take 1 capsule by mouth at bedtime.     . ferrous sulfate 325 (65 FE) MG tablet Take by mouth 2 (two) times daily with a meal.     . Fluticasone-Salmeterol (ADVAIR) 250-50 MCG/DOSE AEPB Inhale 1 puff into the lungs 2 (two) times daily.    Marland Kitchen lisinopril (PRINIVIL,ZESTRIL) 10 MG tablet Take 10 mg by mouth daily.     . magnesium hydroxide (MILK OF MAGNESIA) 400 MG/5ML suspension Take 40 mLs by mouth daily.     . Memantine HCl-Donepezil HCl (NAMZARIC) 28-10 MG CP24 Take 1 capsule by mouth daily.    . metFORMIN (GLUCOPHAGE) 500 MG tablet Take 500 mg by mouth 2 (two) times daily with a meal.     . modafinil (PROVIGIL) 100 MG tablet Take 100 mg by mouth daily.     . Multiple Vitamin (MULTI-VITAMINS) TABS Take 1 tablet by mouth daily.     Marland Kitchen omeprazole (PRILOSEC) 40 MG capsule Take 40 mg by mouth daily.     Marland Kitchen perphenazine (TRILAFON) 2 MG tablet Take 2-4 mg by mouth every 6 (six) hours as needed.     Marland Kitchen  perphenazine (TRILAFON) 4 MG tablet Take 4 mg by mouth 3 (three) times daily.    . QUEtiapine (SEROQUEL XR) 300 MG 24 hr tablet Take 300 mg by mouth at bedtime.    . simvastatin (ZOCOR) 20 MG tablet Take 20 mg by mouth at bedtime.     . Vitamin D, Ergocalciferol, (DRISDOL) 50000 UNITS CAPS capsule Take 50,000 Units by mouth every 30 (thirty) days.      No current facility-administered medications on file prior to visit.      Social History   Social History  . Marital status: Single    Spouse name: N/A  . Number of children: N/A  . Years of education: N/A   Occupational History  . Not on file.   Social History  Main Topics  . Smoking status: Current Some Day Smoker    Packs/day: 0.25    Types: Cigarettes  . Smokeless tobacco: Never Used  . Alcohol use No  . Drug use: No  . Sexual activity: No   Other Topics Concern  . Not on file   Social History Narrative  . No narrative on file    History reviewed. No pertinent family history.  ROS: No fever, myalgias/arthralgias, unexplained weight loss or weight gain No new focal weakness or sensory deficits No otalgia, hearing loss, visual changes, nasal and sinus symptoms, oropharyngeal problems No neck pain or adenopathy No abdominal pain, N/V/D, diarrhea, change in bowel pattern No dysuria, change in urinary pattern   Vitals:   01/17/16 1012  BP: 122/88  Pulse: (!) 53  SpO2: 96%  Weight: 187 lb (84.8 kg)  RA  EXAM:  Gen: WDWN, No overt respiratory distress HEENT: NCAT, sclera white, oropharynx normal Neck: Supple without LAN, thyromegaly, JVD Lungs: breath sounds full and slightly coarse throughout, percussion normal, isolated wheeze in LUL anteriorly Cardiovascular: few extrasystoles, bradycardic, no murmurs noted Abdomen: Soft, nontender, normal BS Ext: without clubbing, cyanosis, edema Neuro: CNs grossly intact, motor and sensory intact Skin: Limited exam, no lesions noted  DATA:   BMP Latest Ref Rng & Units 01/09/2016 01/08/2016 01/05/2016  Glucose 65 - 99 mg/dL 103(H) 263(H) 159(H)  BUN 6 - 20 mg/dL '20 18 14  '$ Creatinine 0.44 - 1.00 mg/dL 0.86 0.92 0.99  Sodium 135 - 145 mmol/L 143 136 142  Potassium 3.5 - 5.1 mmol/L 3.3(L) 4.5 3.2(L)  Chloride 101 - 111 mmol/L 102 99(L) 105  CO2 22 - 32 mmol/L 33(H) 27 27  Calcium 8.9 - 10.3 mg/dL 9.0 8.7(L) 9.0    CBC Latest Ref Rng & Units 01/09/2016 01/08/2016 01/05/2016  WBC 3.6 - 11.0 K/uL 5.8 7.7 6.0  Hemoglobin 12.0 - 16.0 g/dL 12.4 12.3 12.5  Hematocrit 35.0 - 47.0 % 37.5 37.6 37.3  Platelets 150 - 440 K/uL 276 282 258    CXR (01/08/16):  Chronic L suprahilar scarring  (prior XRT)  IMPRESSION:     ICD-9-CM ICD-10-CM   1. Chronic obstructive pulmonary disease, unspecified COPD type ([place]) 496 J44.9   2. COPD exacerbation ([place]) 491.21 J44.1   3. Smoker 305.1 F17.200   4. History of lung cancer V10.11 Z85.118     PLAN:  1) Continue Advair inhaler - one inhalation twice a day 2) Continue albuterol inhaler (Pro-air) as needed 3) Counseled re: need to stop smoking 4) COPD information sheet provided 5) Follow up in 3-4 months   Merton Border, MD PCCM service Mobile [phone] Pager [phone] 01/17/2016

## 2016-01-18 ENCOUNTER — Telehealth: Payer: Self-pay | Admitting: Pulmonary Disease

## 2016-01-18 NOTE — Telephone Encounter (Signed)
Pt caretaker has some questions for Dr. Alva Garnet regarding pt visit yesterday. Please call.

## 2016-01-18 NOTE — Telephone Encounter (Signed)
Spoke with DS and he states that he will not prescribe Chantix or nicotine patches. He states facility needs to stop giving her cigarettes. Spoke with Christy at the facility and she states they need an order stating for them not to give pt cigarettes. Thats because per the state they would be taken away the patients rights. Please advise if you are ok with sending an order for this and I can fax it to [phone].

## 2016-01-24 NOTE — Telephone Encounter (Signed)
Christy at group home. Just following up on the orders  Please call back it is almost a week now

## 2016-01-24 NOTE — Telephone Encounter (Signed)
I have no idea how to place an order at their facility. I guess you have to pass it along. Weird   Don't give patient cigarettes!!  Thanks, Cheryl [name]

## 2016-01-25 MED ORDER — AMBULATORY NON FORMULARY MEDICATION: 0 refills | Status: DC

## 2016-01-25 NOTE — Telephone Encounter (Signed)
Will get with DS to make sure I can print an RX stating pt can't have cigs and will fax.

## 2016-01-25 NOTE — Telephone Encounter (Signed)
RX printed per DS to let him sign stating that pt can have no nicotine. RX faxed to facility.

## 2016-01-25 NOTE — Telephone Encounter (Signed)
So I think this message was ment for you.Marland KitchenMarland KitchenMarland Kitchen

## 2016-01-25 NOTE — Addendum Note (Signed)
Addended by: Oscar La R on: 01/25/2016 03:18 PM   Modules accepted: Orders

## 2016-03-05 ENCOUNTER — Ambulatory Visit: Payer: Medicare Other | Attending: Hematology and Oncology

## 2016-03-07 ENCOUNTER — Inpatient Hospital Stay: Payer: Medicare Other

## 2016-03-07 ENCOUNTER — Inpatient Hospital Stay: Payer: Medicare Other | Admitting: Hematology and Oncology

## 2016-03-07 NOTE — Progress Notes (Deleted)
[facility] day:  03/07/2016  Chief Complaint: Cheryl [name] is a 76 y.o. 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  HPI:  The patient was last seen in the medical oncology clinic on 09/07/2015.  At that time, she was seen for initial assessment by me.  She denied any complaint.  Exam was unremarkable.  She was smoking 6-8 cigarettes a day.  Smoking cessation was encouraged.  We discussed follow-up imaging.  Chest CT angiogram on 01/05/2016 revealed no evidence of pulmonary embolism.  Respiratory motion artifacts limited the exam.  There was scattered patchy infiltrates in RIGHT lung with areas of scarring in LEFT upper lobe question related to prior treatment for lung cancer.  CXR on 01/08/2016 revealed posttreatment changes in the left perihilar region with probable slight increase in surrounding atelectasis. There was no definitive airspace consolidation.  There was peribronchovascular nodularity in the right lung seen on 01/05/2016 which was poorly appreciated on the current exam    Past Medical History:  Diagnosis Date  . COPD (chronic obstructive pulmonary disease) ([place])   . Dementia   . Diabetes mellitus without complication ([place])   . GERD (gastroesophageal reflux disease)   . Hyperlipidemia   . Hypertension   . Lung cancer ([place])   . Mental retardation   . Schizophrenia [facility])     Past Surgical History:  Procedure Laterality Date  . ABDOMINAL HYSTERECTOMY      No family history on file.  Social History:  reports that she has been smoking Cigarettes.  She has been smoking about 0.25 packs per day. She has never used smokeless tobacco. She reports that she does not drink alcohol or use drugs.  She smokes 6-8 cigarettes a day (1 every 2 hours).  She resides in a group home.  She is able to care for 80% of her ADLs.  The patient is accompanied by Whitman Hero, a care worker,  today.  Allergies: No Known Allergies  Current Medications: Current Outpatient Prescriptions  Medication Sig Dispense Refill  . acetaminophen (TYLENOL) 325 MG tablet Take 325 mg by mouth daily as needed.     Marland Kitchen albuterol (PROVENTIL HFA) 108 (90 Base) MCG/ACT inhaler Inhale 2 puffs into the lungs every 4 (four) hours as needed for wheezing or shortness of breath. 1 Inhaler 0  . ALPRAZolam (XANAX) 0.25 MG tablet Take 0.25 mg by mouth 2 (two) times daily.     . AMBULATORY NON FORMULARY MEDICATION D/C all nicotine from the above patient 1 each 0  . amLODipine (NORVASC) 10 MG tablet Take 10 mg by mouth daily.     Marland Kitchen aspirin 325 MG tablet Take 325 mg by mouth daily.     Marland Kitchen Dextromethorphan-Quinidine (NUEDEXTA) 20-10 MG CAPS Take 1 capsule by mouth at bedtime.     . ferrous sulfate 325 (65 FE) MG tablet Take by mouth 2 (two) times daily with a meal.     . Fluticasone-Salmeterol (ADVAIR) 250-50 MCG/DOSE AEPB Inhale 1 puff into the lungs 2 (two) times daily.    Marland Kitchen lisinopril (PRINIVIL,ZESTRIL) 10 MG tablet Take 10 mg by mouth daily.     . magnesium hydroxide (MILK OF MAGNESIA) 400 MG/5ML suspension Take 40 mLs by mouth daily.     . Memantine HCl-Donepezil HCl (NAMZARIC) 28-10 MG CP24 Take 1 capsule by mouth daily.    . metFORMIN (GLUCOPHAGE) 500 MG tablet Take 500 mg by mouth 2 (two)  times daily with a meal.     . modafinil (PROVIGIL) 100 MG tablet Take 100 mg by mouth daily.     . Multiple Vitamin (MULTI-VITAMINS) TABS Take 1 tablet by mouth daily.     Marland Kitchen omeprazole (PRILOSEC) 40 MG capsule Take 40 mg by mouth daily.     Marland Kitchen perphenazine (TRILAFON) 2 MG tablet Take 2-4 mg by mouth every 6 (six) hours as needed.     Marland Kitchen perphenazine (TRILAFON) 4 MG tablet Take 4 mg by mouth 3 (three) times daily.    . QUEtiapine (SEROQUEL XR) 300 MG 24 hr tablet Take 300 mg by mouth at bedtime.    . simvastatin (ZOCOR) 20 MG tablet Take 20 mg by mouth at bedtime.     . Vitamin D, Ergocalciferol, (DRISDOL) 50000 UNITS CAPS  capsule Take 50,000 Units by mouth every 30 (thirty) days.      No current facility-administered medications for this visit.     Review of Systems:  GENERAL:  Feels good.  Active.  No fevers, sweats or weight loss. PERFORMANCE STATUS (ECOG):  3 HEENT:  No visual changes, runny nose, sore throat, mouth sores or tenderness. Lungs: No shortness of breath or cough.  No hemoptysis. Cardiac:  No chest pain, palpitations, orthopnea, or PND. GI:  No nausea, vomiting, diarrhea, constipation, melena or hematochezia. GU:  Incontinent.  No urgency, frequency, dysuria, or hematuria. Musculoskeletal:  No back pain.  No joint pain.  No muscle tenderness. Extremities:  No pain or swelling. Skin:  No rashes or skin changes. Neuro:  Mental retardation.  No headache, numbness or weakness, balance or coordination issues. Endocrine:  Diabetes.  No thyroid issues, hot flashes or night sweats. Psych:  No mood changes, depression or anxiety. Pain:  No focal pain. Review of systems:  All other systems reviewed and found to be negative.  Physical Exam: There were no vitals taken for this visit. GENERAL:  Well developed, well nourished, woman sitting comfortably in the exam room in no acute distress. MENTAL STATUS:  Alert and oriented to person, place and time. HEAD:  Brown hair.  Normocephalic, atraumatic, face symmetric, no Cushingoid features. EYES:  Glasses.  Brown eyes.  Bilateral arcus.  Pupils equal round and reactive to light and accomodation.  No conjunctivitis or scleral icterus. ENT:  Oropharynx clear without lesion.  Tongue normal. Mucous membranes moist.  RESPIRATORY:  Clear to auscultation without rales, wheezes or rhonchi. CARDIOVASCULAR:  Regular rate and rhythm without murmur, rub or gallop. ABDOMEN:  Soft, non-tender, with active bowel sounds, and no hepatosplenomegaly.  No masses. SKIN:  No rashes, ulcers or lesions. EXTREMITIES: No edema, no skin discoloration or tenderness.  No palpable  cords. LYMPH NODES: No palpable cervical, supraclavicular, axillary or inguinal adenopathy  NEUROLOGICAL: Unremarkable. PSYCH:  Appropriate.  No visits with results within 3 Day(s) from this visit.  Latest known visit with results is:  Admission on 01/08/2016, Discharged on 01/10/2016  Component Date Value Ref Range Status  . WBC 01/08/2016 7.7  3.6 - 11.0 K/uL Final  . RBC 01/08/2016 4.45  3.80 - 5.20 MIL/uL Final  . Hemoglobin 01/08/2016 12.3  12.0 - 16.0 g/dL Final  . HCT 01/08/2016 37.6  35.0 - 47.0 % Final  . MCV 01/08/2016 84.5  80.0 - 100.0 fL Final  . MCH 01/08/2016 27.6  26.0 - 34.0 pg Final  . MCHC 01/08/2016 32.6  32.0 - 36.0 g/dL Final  . RDW 01/08/2016 14.4  11.5 - 14.5 % Final  .  Platelets 01/08/2016 282  150 - 440 K/uL Final  . Neutrophils Relative % 01/08/2016 88  % Final  . Neutro Abs 01/08/2016 6.9* 1.4 - 6.5 K/uL Final  . Lymphocytes Relative 01/08/2016 7  % Final  . Lymphs Abs 01/08/2016 0.5* 1.0 - 3.6 K/uL Final  . Monocytes Relative 01/08/2016 3  % Final  . Monocytes Absolute 01/08/2016 0.2  0.2 - 0.9 K/uL Final  . Eosinophils Relative 01/08/2016 1  % Final  . Eosinophils Absolute 01/08/2016 0.1  0 - 0.7 K/uL Final  . Basophils Relative 01/08/2016 1  % Final  . Basophils Absolute 01/08/2016 0.0  0 - 0.1 K/uL Final  . Sodium 01/08/2016 136  135 - 145 mmol/L Final  . Potassium 01/08/2016 4.5  3.5 - 5.1 mmol/L Final  . Chloride 01/08/2016 99* 101 - 111 mmol/L Final  . CO2 01/08/2016 27  22 - 32 mmol/L Final  . Glucose, Bld 01/08/2016 263* 65 - 99 mg/dL Final  . BUN 01/08/2016 18  6 - 20 mg/dL Final  . Creatinine, Ser 01/08/2016 0.92  0.44 - 1.00 mg/dL Final  . Calcium 01/08/2016 8.7* 8.9 - 10.3 mg/dL Final  . GFR calc non Af Amer 01/08/2016 60* >60 mL/min Final  . GFR calc Af Amer 01/08/2016 >60  >60 mL/min Final   Comment: (NOTE) The eGFR has been calculated using the CKD EPI equation. This calculation has not been validated in all clinical  situations. eGFR's persistently <60 mL/min signify possible Chronic Kidney Disease.   . Anion gap 01/08/2016 10  5 - 15 Final  . Sodium 01/09/2016 143  135 - 145 mmol/L Final  . Potassium 01/09/2016 3.3* 3.5 - 5.1 mmol/L Final  . Chloride 01/09/2016 102  101 - 111 mmol/L Final  . CO2 01/09/2016 33* 22 - 32 mmol/L Final  . Glucose, Bld 01/09/2016 103* 65 - 99 mg/dL Final  . BUN 01/09/2016 20  6 - 20 mg/dL Final  . Creatinine, Ser 01/09/2016 0.86  0.44 - 1.00 mg/dL Final  . Calcium 01/09/2016 9.0  8.9 - 10.3 mg/dL Final  . GFR calc non Af Amer 01/09/2016 >60  >60 mL/min Final  . GFR calc Af Amer 01/09/2016 >60  >60 mL/min Final   Comment: (NOTE) The eGFR has been calculated using the CKD EPI equation. This calculation has not been validated in all clinical situations. eGFR's persistently <60 mL/min signify possible Chronic Kidney Disease.   . Anion gap 01/09/2016 8  5 - 15 Final  . WBC 01/09/2016 5.8  3.6 - 11.0 K/uL Final  . RBC 01/09/2016 4.50  3.80 - 5.20 MIL/uL Final  . Hemoglobin 01/09/2016 12.4  12.0 - 16.0 g/dL Final  . HCT 01/09/2016 37.5  35.0 - 47.0 % Final  . MCV 01/09/2016 83.4  80.0 - 100.0 fL Final  . MCH 01/09/2016 27.6  26.0 - 34.0 pg Final  . MCHC 01/09/2016 33.1  32.0 - 36.0 g/dL Final  . RDW 01/09/2016 14.2  11.5 - 14.5 % Final  . Platelets 01/09/2016 276  150 - 440 K/uL Final  . MRSA by PCR 01/08/2016 NEGATIVE  NEGATIVE Final   Comment:        The GeneXpert MRSA Assay (FDA approved for NASAL specimens only), is one component of a comprehensive MRSA colonization surveillance program. It is not intended to diagnose MRSA infection nor to guide or monitor treatment for MRSA infections.   . Glucose-Capillary 01/09/2016 106* 65 - 99 mg/dL Final  . Glucose-Capillary 01/10/2016 97  65 - 99 mg/dL Final    Assessment:  Banita Lehn is a 76 y.o. 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  Chest CT on 07/10/2011 revealed a 4.7 x  3.4 cm pleural-based mass in the left lower lobe.  CT-guided fine-needle aspiration on 07/25/2011 revealed non-small cell carcinoma, favoring adenocarcinoma.    PET scan on 07/11/2011 revealed hypermetabolic subpleural mass in the left lower lobe and mildly hypermetabolic left-sided mediastinal lymph nodes.  There was no evidence of FDG avid metastatic disease in the abdomen or pelvis.  Clinical stage was T2N2M0 (stage IIIA).   She received 4,000 cGy from 08/13/2011 - 09/09/2011 in a large field and 3,000 cGy in a small field boost from 10/13/2011 - 11/03/2011.  She did not receive chemotherapy secondary to her co-morbidities and poor performance status.  PET scan on 08/12/2012 revealed resolution of the PET positive mass.  Chest CT on 09/05/2015 revealed stable postradiation changes in the left lung. There was no evidence of recurrent or metastatic disease in the chest.  There were stable hepatic cysts.  She has mental retardation.  She lives in a group home.  Symptomatically, she denies any complaint.  Exam is unremarkable.  She is smoking 6-8 cigarettes a day.  Plan: 1.  Labs today:  CBC with diff, CMP, CEA.  Review entire medical history, diagnosis and management of lung cancer. 2.  Discuss plan for ongoing follow-up every 6 months. 3.  Encourage smoking cessation. 4.  Schedule chest CT in 6 months 5.  RTC in 6 months for MD assessment, labs (CBC with diff, CMP, CEA), and review of chest CT.   Lequita Asal, MD  03/07/2016, 5:16 AM

## 2016-03-24 ENCOUNTER — Inpatient Hospital Stay: Payer: Medicare Other

## 2016-03-24 ENCOUNTER — Inpatient Hospital Stay: Payer: Medicare Other | Admitting: Hematology and Oncology

## 2016-03-24 NOTE — Progress Notes (Deleted)
[facility] day:  03/24/2016  Chief Complaint: Cheryl [name] is a 76 y.o. 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  HPI:  The patient was last seen in the medical oncology clinic on 09/07/2015.  At that time, she was seen for initial assessment by me.  She denied any complaint.  Exam was unremarkable.  She was smoking 6-8 cigarettes a day.  Smoking cessation was encouraged.  We discussed follow-up imaging.  Chest CT angiogram on 01/05/2016 revealed no evidence of pulmonary embolism.  Respiratory motion artifacts limited the exam.  There was scattered patchy infiltrates in RIGHT lung with areas of scarring in LEFT upper lobe question related to prior treatment for lung cancer.  CXR on 01/08/2016 revealed posttreatment changes in the left perihilar region with probable slight increase in surrounding atelectasis. There was no definitive airspace consolidation.  There was peribronchovascular nodularity in the right lung seen on 01/05/2016 which was poorly appreciated on the current exam    Past Medical History:  Diagnosis Date  . COPD (chronic obstructive pulmonary disease) ([place])   . Dementia   . Diabetes mellitus without complication ([place])   . GERD (gastroesophageal reflux disease)   . Hyperlipidemia   . Hypertension   . Lung cancer ([place])   . Mental retardation   . Schizophrenia [facility])     Past Surgical History:  Procedure Laterality Date  . ABDOMINAL HYSTERECTOMY      No family history on file.  Social History:  reports that she has been smoking Cigarettes.  She has been smoking about 0.25 packs per day. She has never used smokeless tobacco. She reports that she does not drink alcohol or use drugs.  She smokes 6-8 cigarettes a day (1 every 2 hours).  She resides in a group home.  She is able to care for 80% of her ADLs.  The patient is accompanied by Whitman Hero, a care worker,  today.  Allergies: No Known Allergies  Current Medications: Current Outpatient Prescriptions  Medication Sig Dispense Refill  . acetaminophen (TYLENOL) 325 MG tablet Take 325 mg by mouth daily as needed.     Marland Kitchen albuterol (PROVENTIL HFA) 108 (90 Base) MCG/ACT inhaler Inhale 2 puffs into the lungs every 4 (four) hours as needed for wheezing or shortness of breath. 1 Inhaler 0  . ALPRAZolam (XANAX) 0.25 MG tablet Take 0.25 mg by mouth 2 (two) times daily.     . AMBULATORY NON FORMULARY MEDICATION D/C all nicotine from the above patient 1 each 0  . amLODipine (NORVASC) 10 MG tablet Take 10 mg by mouth daily.     Marland Kitchen aspirin 325 MG tablet Take 325 mg by mouth daily.     Marland Kitchen Dextromethorphan-Quinidine (NUEDEXTA) 20-10 MG CAPS Take 1 capsule by mouth at bedtime.     . ferrous sulfate 325 (65 FE) MG tablet Take by mouth 2 (two) times daily with a meal.     . Fluticasone-Salmeterol (ADVAIR) 250-50 MCG/DOSE AEPB Inhale 1 puff into the lungs 2 (two) times daily.    Marland Kitchen lisinopril (PRINIVIL,ZESTRIL) 10 MG tablet Take 10 mg by mouth daily.     . magnesium hydroxide (MILK OF MAGNESIA) 400 MG/5ML suspension Take 40 mLs by mouth daily.     . Memantine HCl-Donepezil HCl (NAMZARIC) 28-10 MG CP24 Take 1 capsule by mouth daily.    . metFORMIN (GLUCOPHAGE) 500 MG tablet Take 500 mg by mouth 2 (two)  times daily with a meal.     . modafinil (PROVIGIL) 100 MG tablet Take 100 mg by mouth daily.     . Multiple Vitamin (MULTI-VITAMINS) TABS Take 1 tablet by mouth daily.     Marland Kitchen omeprazole (PRILOSEC) 40 MG capsule Take 40 mg by mouth daily.     Marland Kitchen perphenazine (TRILAFON) 2 MG tablet Take 2-4 mg by mouth every 6 (six) hours as needed.     Marland Kitchen perphenazine (TRILAFON) 4 MG tablet Take 4 mg by mouth 3 (three) times daily.    . QUEtiapine (SEROQUEL XR) 300 MG 24 hr tablet Take 300 mg by mouth at bedtime.    . simvastatin (ZOCOR) 20 MG tablet Take 20 mg by mouth at bedtime.     . Vitamin D, Ergocalciferol, (DRISDOL) 50000 UNITS CAPS  capsule Take 50,000 Units by mouth every 30 (thirty) days.      No current facility-administered medications for this visit.     Review of Systems:  GENERAL:  Feels good.  Active.  No fevers, sweats or weight loss. PERFORMANCE STATUS (ECOG):  3 HEENT:  No visual changes, runny nose, sore throat, mouth sores or tenderness. Lungs: No shortness of breath or cough.  No hemoptysis. Cardiac:  No chest pain, palpitations, orthopnea, or PND. GI:  No nausea, vomiting, diarrhea, constipation, melena or hematochezia. GU:  Incontinent.  No urgency, frequency, dysuria, or hematuria. Musculoskeletal:  No back pain.  No joint pain.  No muscle tenderness. Extremities:  No pain or swelling. Skin:  No rashes or skin changes. Neuro:  Mental retardation.  No headache, numbness or weakness, balance or coordination issues. Endocrine:  Diabetes.  No thyroid issues, hot flashes or night sweats. Psych:  No mood changes, depression or anxiety. Pain:  No focal pain. Review of systems:  All other systems reviewed and found to be negative.  Physical Exam: There were no vitals taken for this visit. GENERAL:  Well developed, well nourished, woman sitting comfortably in the exam room in no acute distress. MENTAL STATUS:  Alert and oriented to person, place and time. HEAD:  Brown hair.  Normocephalic, atraumatic, face symmetric, no Cushingoid features. EYES:  Glasses.  Brown eyes.  Bilateral arcus.  Pupils equal round and reactive to light and accomodation.  No conjunctivitis or scleral icterus. ENT:  Oropharynx clear without lesion.  Tongue normal. Mucous membranes moist.  RESPIRATORY:  Clear to auscultation without rales, wheezes or rhonchi. CARDIOVASCULAR:  Regular rate and rhythm without murmur, rub or gallop. ABDOMEN:  Soft, non-tender, with active bowel sounds, and no hepatosplenomegaly.  No masses. SKIN:  No rashes, ulcers or lesions. EXTREMITIES: No edema, no skin discoloration or tenderness.  No palpable  cords. LYMPH NODES: No palpable cervical, supraclavicular, axillary or inguinal adenopathy  NEUROLOGICAL: Unremarkable. PSYCH:  Appropriate.  No visits with results within 3 Day(s) from this visit.  Latest known visit with results is:  Admission on 01/08/2016, Discharged on 01/10/2016  Component Date Value Ref Range Status  . WBC 01/08/2016 7.7  3.6 - 11.0 K/uL Final  . RBC 01/08/2016 4.45  3.80 - 5.20 MIL/uL Final  . Hemoglobin 01/08/2016 12.3  12.0 - 16.0 g/dL Final  . HCT 01/08/2016 37.6  35.0 - 47.0 % Final  . MCV 01/08/2016 84.5  80.0 - 100.0 fL Final  . MCH 01/08/2016 27.6  26.0 - 34.0 pg Final  . MCHC 01/08/2016 32.6  32.0 - 36.0 g/dL Final  . RDW 01/08/2016 14.4  11.5 - 14.5 % Final  .  Platelets 01/08/2016 282  150 - 440 K/uL Final  . Neutrophils Relative % 01/08/2016 88  % Final  . Neutro Abs 01/08/2016 6.9* 1.4 - 6.5 K/uL Final  . Lymphocytes Relative 01/08/2016 7  % Final  . Lymphs Abs 01/08/2016 0.5* 1.0 - 3.6 K/uL Final  . Monocytes Relative 01/08/2016 3  % Final  . Monocytes Absolute 01/08/2016 0.2  0.2 - 0.9 K/uL Final  . Eosinophils Relative 01/08/2016 1  % Final  . Eosinophils Absolute 01/08/2016 0.1  0 - 0.7 K/uL Final  . Basophils Relative 01/08/2016 1  % Final  . Basophils Absolute 01/08/2016 0.0  0 - 0.1 K/uL Final  . Sodium 01/08/2016 136  135 - 145 mmol/L Final  . Potassium 01/08/2016 4.5  3.5 - 5.1 mmol/L Final  . Chloride 01/08/2016 99* 101 - 111 mmol/L Final  . CO2 01/08/2016 27  22 - 32 mmol/L Final  . Glucose, Bld 01/08/2016 263* 65 - 99 mg/dL Final  . BUN 01/08/2016 18  6 - 20 mg/dL Final  . Creatinine, Ser 01/08/2016 0.92  0.44 - 1.00 mg/dL Final  . Calcium 01/08/2016 8.7* 8.9 - 10.3 mg/dL Final  . GFR calc non Af Amer 01/08/2016 60* >60 mL/min Final  . GFR calc Af Amer 01/08/2016 >60  >60 mL/min Final   Comment: (NOTE) The eGFR has been calculated using the CKD EPI equation. This calculation has not been validated in all clinical  situations. eGFR's persistently <60 mL/min signify possible Chronic Kidney Disease.   . Anion gap 01/08/2016 10  5 - 15 Final  . Sodium 01/09/2016 143  135 - 145 mmol/L Final  . Potassium 01/09/2016 3.3* 3.5 - 5.1 mmol/L Final  . Chloride 01/09/2016 102  101 - 111 mmol/L Final  . CO2 01/09/2016 33* 22 - 32 mmol/L Final  . Glucose, Bld 01/09/2016 103* 65 - 99 mg/dL Final  . BUN 01/09/2016 20  6 - 20 mg/dL Final  . Creatinine, Ser 01/09/2016 0.86  0.44 - 1.00 mg/dL Final  . Calcium 01/09/2016 9.0  8.9 - 10.3 mg/dL Final  . GFR calc non Af Amer 01/09/2016 >60  >60 mL/min Final  . GFR calc Af Amer 01/09/2016 >60  >60 mL/min Final   Comment: (NOTE) The eGFR has been calculated using the CKD EPI equation. This calculation has not been validated in all clinical situations. eGFR's persistently <60 mL/min signify possible Chronic Kidney Disease.   . Anion gap 01/09/2016 8  5 - 15 Final  . WBC 01/09/2016 5.8  3.6 - 11.0 K/uL Final  . RBC 01/09/2016 4.50  3.80 - 5.20 MIL/uL Final  . Hemoglobin 01/09/2016 12.4  12.0 - 16.0 g/dL Final  . HCT 01/09/2016 37.5  35.0 - 47.0 % Final  . MCV 01/09/2016 83.4  80.0 - 100.0 fL Final  . MCH 01/09/2016 27.6  26.0 - 34.0 pg Final  . MCHC 01/09/2016 33.1  32.0 - 36.0 g/dL Final  . RDW 01/09/2016 14.2  11.5 - 14.5 % Final  . Platelets 01/09/2016 276  150 - 440 K/uL Final  . MRSA by PCR 01/08/2016 NEGATIVE  NEGATIVE Final   Comment:        The GeneXpert MRSA Assay (FDA approved for NASAL specimens only), is one component of a comprehensive MRSA colonization surveillance program. It is not intended to diagnose MRSA infection nor to guide or monitor treatment for MRSA infections.   . Glucose-Capillary 01/09/2016 106* 65 - 99 mg/dL Final  . Glucose-Capillary 01/10/2016 97  65 - 99 mg/dL Final    Assessment:  Cheryl [name] is a 76 y.o. 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  Chest CT on 07/10/2011 revealed a 4.7 x  3.4 cm pleural-based mass in the left lower lobe.  CT-guided fine-needle aspiration on 07/25/2011 revealed non-small cell carcinoma, favoring adenocarcinoma.    PET scan on 07/11/2011 revealed hypermetabolic subpleural mass in the left lower lobe and mildly hypermetabolic left-sided mediastinal lymph nodes.  There was no evidence of FDG avid metastatic disease in the abdomen or pelvis.  Clinical stage was T2N2M0 (stage IIIA).   She received 4,000 cGy from 08/13/2011 - 09/09/2011 in a large field and 3,000 cGy in a small field boost from 10/13/2011 - 11/03/2011.  She did not receive chemotherapy secondary to her co-morbidities and poor performance status.  PET scan on 08/12/2012 revealed resolution of the PET positive mass.  Chest CT on 09/05/2015 revealed stable postradiation changes in the left lung. There was no evidence of recurrent or metastatic disease in the chest.  There were stable hepatic cysts.  She has mental retardation.  She lives in a group home.  Symptomatically, she denies any complaint.  Exam is unremarkable.  She is smoking 6-8 cigarettes a day.  Plan: 1.  Labs today:  CBC with diff, CMP, CEA.  Review entire medical history, diagnosis and management of lung cancer. 2.  Discuss plan for ongoing follow-up every 6 months. 3.  Encourage smoking cessation. 4.  Schedule chest CT in 6 months 5.  RTC in 6 months for MD assessment, labs (CBC with diff, CMP, CEA), and review of chest CT.   Lequita Asal, MD  03/24/2016, 6:14 AM

## 2016-04-07 ENCOUNTER — Inpatient Hospital Stay (HOSPITAL_BASED_OUTPATIENT_CLINIC_OR_DEPARTMENT_OTHER): Payer: Medicare Other | Admitting: Hematology and Oncology

## 2016-04-07 ENCOUNTER — Inpatient Hospital Stay: Payer: Medicare Other | Attending: Hematology and Oncology

## 2016-04-07 ENCOUNTER — Encounter: Payer: Self-pay | Admitting: Hematology and Oncology

## 2016-04-07 VITALS — BP 108/73 | HR 93 | Temp 98.2°F | Resp 18 | Wt 189.6 lb

## 2016-04-07 DIAGNOSIS — F17210 Nicotine dependence, cigarettes, uncomplicated: Secondary | ICD-10-CM

## 2016-04-07 DIAGNOSIS — F209 Schizophrenia, unspecified: Secondary | ICD-10-CM | POA: Diagnosis not present

## 2016-04-07 DIAGNOSIS — F0390 Unspecified dementia without behavioral disturbance: Secondary | ICD-10-CM | POA: Insufficient documentation

## 2016-04-07 DIAGNOSIS — I10 Essential (primary) hypertension: Secondary | ICD-10-CM | POA: Diagnosis not present

## 2016-04-07 DIAGNOSIS — J449 Chronic obstructive pulmonary disease, unspecified: Secondary | ICD-10-CM | POA: Diagnosis not present

## 2016-04-07 DIAGNOSIS — Z79899 Other long term (current) drug therapy: Secondary | ICD-10-CM | POA: Diagnosis not present

## 2016-04-07 DIAGNOSIS — Z7984 Long term (current) use of oral hypoglycemic drugs: Secondary | ICD-10-CM | POA: Insufficient documentation

## 2016-04-07 DIAGNOSIS — Z7982 Long term (current) use of aspirin: Secondary | ICD-10-CM | POA: Diagnosis not present

## 2016-04-07 DIAGNOSIS — C3432 Malignant neoplasm of lower lobe, left bronchus or lung: Secondary | ICD-10-CM | POA: Insufficient documentation

## 2016-04-07 DIAGNOSIS — E119 Type 2 diabetes mellitus without complications: Secondary | ICD-10-CM | POA: Insufficient documentation

## 2016-04-07 DIAGNOSIS — Z923 Personal history of irradiation: Secondary | ICD-10-CM | POA: Diagnosis not present

## 2016-04-07 DIAGNOSIS — E785 Hyperlipidemia, unspecified: Secondary | ICD-10-CM | POA: Insufficient documentation

## 2016-04-07 DIAGNOSIS — K219 Gastro-esophageal reflux disease without esophagitis: Secondary | ICD-10-CM | POA: Diagnosis not present

## 2016-04-07 DIAGNOSIS — F79 Unspecified intellectual disabilities: Secondary | ICD-10-CM | POA: Diagnosis not present

## 2016-04-07 LAB — COMPREHENSIVE METABOLIC PANEL
ALT: 22 U/L (ref 14–54)
AST: 31 U/L (ref 15–41)
Albumin: 3.7 g/dL (ref 3.5–5.0)
Alkaline Phosphatase: 61 U/L (ref 38–126)
Anion gap: 6 (ref 5–15)
BUN: 23 mg/dL — ABNORMAL HIGH (ref 6–20)
CO2: 26 mmol/L (ref 22–32)
Calcium: 9 mg/dL (ref 8.9–10.3)
Chloride: 107 mmol/L (ref 101–111)
Creatinine, Ser: 1.14 mg/dL — ABNORMAL HIGH (ref 0.44–1.00)
GFR calc Af Amer: 54 mL/min — ABNORMAL LOW (ref >60)
GFR calc non Af Amer: 46 mL/min — ABNORMAL LOW (ref >60)
Glucose, Bld: 129 mg/dL — ABNORMAL HIGH (ref 65–99)
Potassium: 3.8 mmol/L (ref 3.5–5.1)
Sodium: 139 mmol/L (ref 135–145)
Total Bilirubin: 0.4 mg/dL (ref 0.3–1.2)
Total Protein: 7.5 g/dL (ref 6.5–8.1)

## 2016-04-07 LAB — CBC WITH DIFFERENTIAL/PLATELET
Basophils Absolute: 0 10*3/uL (ref 0–0.1)
Basophils Relative: 1 %
Eosinophils Absolute: 0.1 10*3/uL (ref 0–0.7)
Eosinophils Relative: 1 %
HCT: 34.3 % — ABNORMAL LOW (ref 35.0–47.0)
Hemoglobin: 11.5 g/dL — ABNORMAL LOW (ref 12.0–16.0)
Lymphocytes Relative: 17 %
Lymphs Abs: 0.9 10*3/uL — ABNORMAL LOW (ref 1.0–3.6)
MCH: 27.8 pg (ref 26.0–34.0)
MCHC: 33.3 g/dL (ref 32.0–36.0)
MCV: 83.5 fL (ref 80.0–100.0)
Monocytes Absolute: 1 10*3/uL — ABNORMAL HIGH (ref 0.2–0.9)
Monocytes Relative: 17 %
Neutro Abs: 3.7 10*3/uL (ref 1.4–6.5)
Neutrophils Relative %: 64 %
Platelets: 268 10*3/uL (ref 150–440)
RBC: 4.11 MIL/uL (ref 3.80–5.20)
RDW: 14.2 % (ref 11.5–14.5)
WBC: 5.7 10*3/uL (ref 3.6–11.0)

## 2016-04-07 NOTE — Progress Notes (Signed)
[facility] day:  04/07/2016  Chief Complaint: Cheryl [name] is a 76 y.o. 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  HPI:  The patient was last seen in the medical oncology clinic on 09/07/2015.  At that time, she was seen for initial assessment by me.  She denied any complaint.  Exam was unremarkable.  She was smoking 6-8 cigarettes a day.  Smoking cessation was encouraged.  We discussed follow-up imaging.  Chest CT angiogram on 01/05/2016 revealed no evidence of pulmonary embolism.  Respiratory motion artifacts limited the exam.  There was scattered patchy infiltrates in RIGHT lung with areas of scarring in LEFT upper lobe question related to prior treatment for lung cancer.  CXR on 01/08/2016 revealed posttreatment changes in the left perihilar region with probable slight increase in surrounding atelectasis. There was no definitive airspace consolidation.  There was peribronchovascular nodularity in the right lung seen on 01/05/2016 which was poorly appreciated on the current exam  Symptomatically, she denies any respiratory complaints.  She denies any shortness of breath or cough.  She notes some loose stools since starting Lipitor.    Past Medical History:  Diagnosis Date  . COPD (chronic obstructive pulmonary disease) ([place])   . Dementia   . Diabetes mellitus without complication ([place])   . GERD (gastroesophageal reflux disease)   . Hyperlipidemia   . Hypertension   . Lung cancer ([place])   . Mental retardation   . Schizophrenia [facility])     Past Surgical History:  Procedure Laterality Date  . ABDOMINAL HYSTERECTOMY      History reviewed. No pertinent family history.  Social History:  reports that she has been smoking Cigarettes.  She has been smoking about 0.25 packs per day. She has never used smokeless tobacco. She reports that she does not drink alcohol or use drugs.  She was smoking  6-8 cigarettes a day (1 every 2 hours).  She no longer smokes.  She resides in a group home [facility]).  She is able to care for 80% of her ADLs.  The patient is accompanied by Cheryl [name], from the [facility] group home, today.  Allergies: No Known Allergies  Current Medications: Current Outpatient Prescriptions  Medication Sig Dispense Refill  . acetaminophen (TYLENOL) 325 MG tablet Take 325 mg by mouth daily as needed.     Marland Kitchen albuterol (PROVENTIL HFA) 108 (90 Base) MCG/ACT inhaler Inhale 2 puffs into the lungs every 4 (four) hours as needed for wheezing or shortness of breath. 1 Inhaler 0  . ALPRAZolam (XANAX) 0.25 MG tablet Take 0.25 mg by mouth 2 (two) times daily.     Marland Kitchen amLODipine (NORVASC) 10 MG tablet Take 10 mg by mouth daily.     Marland Kitchen aspirin 325 MG tablet Take 325 mg by mouth daily.     Marland Kitchen atorvastatin (LIPITOR) 20 MG tablet Take 20 mg by mouth daily.    Marland Kitchen Dextromethorphan-Quinidine (NUEDEXTA) 20-10 MG CAPS Take 1 capsule by mouth at bedtime.     . ferrous sulfate 325 (65 FE) MG tablet Take by mouth 2 (two) times daily with a meal.     . Fluticasone-Salmeterol (ADVAIR) 250-50 MCG/DOSE AEPB Inhale 1 puff into the lungs 2 (two) times daily.    Marland Kitchen lisinopril (PRINIVIL,ZESTRIL) 10 MG tablet Take 10 mg by mouth daily.     . magnesium hydroxide (MILK OF MAGNESIA) 400 MG/5ML suspension Take 40 mLs by mouth daily.     Marland Kitchen  Memantine HCl-Donepezil HCl (NAMZARIC) 28-10 MG CP24 Take 1 capsule by mouth daily.    . metFORMIN (GLUCOPHAGE) 500 MG tablet Take 500 mg by mouth 2 (two) times daily with a meal.     . modafinil (PROVIGIL) 100 MG tablet Take 100 mg by mouth daily.     . Multiple Vitamin (MULTI-VITAMINS) TABS Take 1 tablet by mouth daily.     Marland Kitchen omeprazole (PRILOSEC) 40 MG capsule Take 40 mg by mouth daily.     Marland Kitchen perphenazine (TRILAFON) 2 MG tablet Take 2-4 mg by mouth every 6 (six) hours as needed.     Marland Kitchen perphenazine (TRILAFON) 4 MG tablet Take 4 mg by mouth 3 (three) times daily.    . QUEtiapine (SEROQUEL XR) 300  MG 24 hr tablet Take 300 mg by mouth at bedtime.    . Vitamin D, Ergocalciferol, (DRISDOL) 50000 UNITS CAPS capsule Take 50,000 Units by mouth every 30 (thirty) days.     . AMBULATORY NON FORMULARY MEDICATION D/C all nicotine from the above patient 1 each 0   No current facility-administered medications for this visit.     Review of Systems:  GENERAL:  Feels "ok".  No fevers, sweats or weight loss. PERFORMANCE STATUS (ECOG):  3 HEENT:  No visual changes, runny nose, sore throat, mouth sores or tenderness. Lungs: No shortness of breath or cough.  No hemoptysis. Cardiac:  No chest pain, palpitations, orthopnea, or PND. GI:  No nausea, vomiting, diarrhea, constipation, melena or hematochezia. GU:  Incontinent.  No urgency, frequency, dysuria, or hematuria. Musculoskeletal:  No back pain.  No joint pain.  No muscle tenderness. Extremities:  No pain or swelling. Skin:  No rashes or skin changes. Neuro:  Mental retardation.  No headache, numbness or weakness, balance or coordination issues. Endocrine:  Diabetes.  No thyroid issues, hot flashes or night sweats. Psych:  No mood changes, depression or anxiety. Pain:  No focal pain. Review of systems:  All other systems reviewed and found to be negative.  Physical Exam: Blood pressure 108/73, pulse 93, temperature 98.2 F (36.8 C), temperature source Tympanic, resp. rate 18, weight 189 lb 9 oz (86 kg). GENERAL:  Well developed, well nourished, woman sitting comfortably in the exam room in no acute distress. MENTAL STATUS:  Alert and oriented to person, place and time. HEAD:  Cheryl [name] hair.  Normocephalic, atraumatic, face symmetric, no Cushingoid features. EYES:  Glasses.  Brown eyes.  Bilateral arcus.  Pupils equal round and reactive to light and accomodation.  No conjunctivitis or scleral icterus. ENT:  Oropharynx clear without lesion.  Tongue normal. Mucous membranes moist.  RESPIRATORY:  Clear to auscultation without rales, wheezes or  rhonchi. CARDIOVASCULAR:  Regular rate and rhythm without murmur, rub or gallop. ABDOMEN:  Soft, non-tender, with active bowel sounds, and no hepatosplenomegaly.  No masses. SKIN:  No rashes, ulcers or lesions. EXTREMITIES: No edema, no skin discoloration or tenderness.  No palpable cords. LYMPH NODES: No palpable cervical, supraclavicular, axillary or inguinal adenopathy  NEUROLOGICAL: Mumbling.  Mentally slow. PSYCH:  Appropriate.   Appointment on 04/07/2016  Component Date Value Ref Range Status  . WBC 04/07/2016 5.7  3.6 - 11.0 K/uL Final  . RBC 04/07/2016 4.11  3.80 - 5.20 MIL/uL Final  . Hemoglobin 04/07/2016 11.5* 12.0 - 16.0 g/dL Final  . HCT 04/07/2016 34.3* 35.0 - 47.0 % Final  . MCV 04/07/2016 83.5  80.0 - 100.0 fL Final  . MCH 04/07/2016 27.8  26.0 - 34.0 pg Final  .  MCHC 04/07/2016 33.3  32.0 - 36.0 g/dL Final  . RDW 04/07/2016 14.2  11.5 - 14.5 % Final  . Platelets 04/07/2016 268  150 - 440 K/uL Final  . Neutrophils Relative % 04/07/2016 64  % Final  . Neutro Abs 04/07/2016 3.7  1.4 - 6.5 K/uL Final  . Lymphocytes Relative 04/07/2016 17  % Final  . Lymphs Abs 04/07/2016 0.9* 1.0 - 3.6 K/uL Final  . Monocytes Relative 04/07/2016 17  % Final  . Monocytes Absolute 04/07/2016 1.0* 0.2 - 0.9 K/uL Final  . Eosinophils Relative 04/07/2016 1  % Final  . Eosinophils Absolute 04/07/2016 0.1  0 - 0.7 K/uL Final  . Basophils Relative 04/07/2016 1  % Final  . Basophils Absolute 04/07/2016 0.0  0 - 0.1 K/uL Final  . Sodium 04/07/2016 139  135 - 145 mmol/L Final  . Potassium 04/07/2016 3.8  3.5 - 5.1 mmol/L Final  . Chloride 04/07/2016 107  101 - 111 mmol/L Final  . CO2 04/07/2016 26  22 - 32 mmol/L Final  . Glucose, Bld 04/07/2016 129* 65 - 99 mg/dL Final  . BUN 04/07/2016 23* 6 - 20 mg/dL Final  . Creatinine, Ser 04/07/2016 1.14* 0.44 - 1.00 mg/dL Final  . Calcium 04/07/2016 9.0  8.9 - 10.3 mg/dL Final  . Total Protein 04/07/2016 7.5  6.5 - 8.1 g/dL Final  . Albumin  04/07/2016 3.7  3.5 - 5.0 g/dL Final  . AST 04/07/2016 31  15 - 41 U/L Final  . ALT 04/07/2016 22  14 - 54 U/L Final  . Alkaline Phosphatase 04/07/2016 61  38 - 126 U/L Final  . Total Bilirubin 04/07/2016 0.4  0.3 - 1.2 mg/dL Final  . GFR calc non Af Amer 04/07/2016 46* >60 mL/min Final  . GFR calc Af Amer 04/07/2016 54* >60 mL/min Final   Comment: (NOTE) The eGFR has been calculated using the CKD EPI equation. This calculation has not been validated in all clinical situations. eGFR's persistently <60 mL/min signify possible Chronic Kidney Disease.   . Anion gap 04/07/2016 6  5 - 15 Final    Assessment:  Cheryl [name] is a 76 y.o. 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  Chest CT on 07/10/2011 revealed a 4.7 x 3.4 cm pleural-based mass in the left lower lobe.  CT-guided fine-needle aspiration on 07/25/2011 revealed non-small cell carcinoma, favoring adenocarcinoma.    PET scan on 07/11/2011 revealed hypermetabolic subpleural mass in the left lower lobe and mildly hypermetabolic left-sided mediastinal lymph nodes.  There was no evidence of FDG avid metastatic disease in the abdomen or pelvis.  Clinical stage was T2N2M0 (stage IIIA).   She received 4,000 cGy from 08/13/2011 - 09/09/2011 in a large field and 3,000 cGy in a small field boost from 10/13/2011 - 11/03/2011.  She did not receive chemotherapy secondary to her co-morbidities and poor performance status.  PET scan on 08/12/2012 revealed resolution of the PET positive mass.  Chest CT on 09/05/2015 revealed stable postradiation changes in the left lung. There was no evidence of recurrent or metastatic disease in the chest.  There were stable hepatic cysts.  Chest CT angiogram on 01/05/2016 revealed no evidence of pulmonary embolism.  Respiratory motion artifacts limited the exam.  There was scattered patchy infiltrates in RIGHT lung with areas of scarring in LEFT upper lobe question related to prior  treatment for lung cancer.  She has mental retardation.  She lives in a group home.  Symptomatically,  she denies any complaint.  Exam is stable.  She is not smoking.  Plan: 1.  Labs today:  CBC with diff, CMP, CEA. 2.  Schedule chest CT in 6 months 3.  RTC in 6 months for MD assessment, labs (CBC with diff, CMP, CEA), and review of chest CT.   Lequita Asal, MD  04/07/2016, 4:14 PM

## 2016-04-07 NOTE — Progress Notes (Signed)
Patient has recently been switched from Zocor to Lipitor and has started having problems getting to the BR for bowel movements.

## 2016-04-08 LAB — CEA: CEA: 4.5 ng/mL (ref 0.0–4.7)

## 2016-05-04 ENCOUNTER — Emergency Department
Admission: EM | Admit: 2016-05-04 | Discharge: 2016-05-04 | Disposition: A | Discharge status: Home / Self Care | Payer: Medicare Other | Attending: Emergency Medicine | Admitting: Emergency Medicine

## 2016-05-04 ENCOUNTER — Emergency Department: Payer: Medicare Other

## 2016-05-04 DIAGNOSIS — R4182 Altered mental status, unspecified: Secondary | ICD-10-CM | POA: Diagnosis present

## 2016-05-04 DIAGNOSIS — I10 Essential (primary) hypertension: Secondary | ICD-10-CM | POA: Diagnosis not present

## 2016-05-04 DIAGNOSIS — Z79899 Other long term (current) drug therapy: Secondary | ICD-10-CM | POA: Diagnosis not present

## 2016-05-04 DIAGNOSIS — Z7984 Long term (current) use of oral hypoglycemic drugs: Secondary | ICD-10-CM | POA: Insufficient documentation

## 2016-05-04 DIAGNOSIS — F17210 Nicotine dependence, cigarettes, uncomplicated: Secondary | ICD-10-CM | POA: Diagnosis not present

## 2016-05-04 DIAGNOSIS — E119 Type 2 diabetes mellitus without complications: Secondary | ICD-10-CM | POA: Diagnosis not present

## 2016-05-04 DIAGNOSIS — J449 Chronic obstructive pulmonary disease, unspecified: Secondary | ICD-10-CM | POA: Diagnosis not present

## 2016-05-04 DIAGNOSIS — F0390 Unspecified dementia without behavioral disturbance: Secondary | ICD-10-CM | POA: Diagnosis not present

## 2016-05-04 DIAGNOSIS — Z85118 Personal history of other malignant neoplasm of bronchus and lung: Secondary | ICD-10-CM | POA: Insufficient documentation

## 2016-05-04 LAB — COMPREHENSIVE METABOLIC PANEL
ALT: 17 U/L (ref 14–54)
AST: 26 U/L (ref 15–41)
Albumin: 4 g/dL (ref 3.5–5.0)
Alkaline Phosphatase: 77 U/L (ref 38–126)
Anion gap: 10 (ref 5–15)
BILIRUBIN TOTAL: 0.6 mg/dL (ref 0.3–1.2)
BUN: 16 mg/dL (ref 6–20)
CHLORIDE: 103 mmol/L (ref 101–111)
CO2: 28 mmol/L (ref 22–32)
CREATININE: 1.12 mg/dL — AB (ref 0.44–1.00)
Calcium: 9.4 mg/dL (ref 8.9–10.3)
GFR calc Af Amer: 55 mL/min — ABNORMAL LOW (ref >60)
GFR, EST NON AFRICAN AMERICAN: 47 mL/min — AB (ref >60)
Glucose, Bld: 206 mg/dL — ABNORMAL HIGH (ref 65–99)
POTASSIUM: 3.6 mmol/L (ref 3.5–5.1)
Sodium: 141 mmol/L (ref 135–145)
TOTAL PROTEIN: 7.8 g/dL (ref 6.5–8.1)

## 2016-05-04 LAB — URINALYSIS, COMPLETE (UACMP) WITH MICROSCOPIC
BACTERIA UA: NONE SEEN
BILIRUBIN URINE: NEGATIVE
GLUCOSE, UA: NEGATIVE mg/dL
HGB URINE DIPSTICK: NEGATIVE
Ketones, ur: NEGATIVE mg/dL
LEUKOCYTES UA: NEGATIVE
NITRITE: NEGATIVE
Protein, ur: 30 mg/dL — AB
SPECIFIC GRAVITY, URINE: 1.021 (ref 1.005–1.030)
Squamous Epithelial / LPF: NONE SEEN
pH: 5 (ref 5.0–8.0)

## 2016-05-04 LAB — CBC
HCT: 39.7 % (ref 35.0–47.0)
Hemoglobin: 12.8 g/dL (ref 12.0–16.0)
MCH: 27.3 pg (ref 26.0–34.0)
MCHC: 32.4 g/dL (ref 32.0–36.0)
MCV: 84.4 fL (ref 80.0–100.0)
PLATELETS: 301 10*3/uL (ref 150–440)
RBC: 4.7 MIL/uL (ref 3.80–5.20)
RDW: 14.7 % — ABNORMAL HIGH (ref 11.5–14.5)
WBC: 6.3 10*3/uL (ref 3.6–11.0)

## 2016-05-04 LAB — TROPONIN I

## 2016-05-04 LAB — GLUCOSE, CAPILLARY: Glucose-Capillary: 176 mg/dL — ABNORMAL HIGH (ref 65–99)

## 2016-05-04 NOTE — Discharge Instructions (Signed)
Please seek medical attention for any high fevers, chest pain, shortness of breath, change in behavior, persistent vomiting, bloody stool or any other new or concerning symptoms.  

## 2016-05-04 NOTE — ED Triage Notes (Signed)
Pt presents from [facility]) with acute confusion, dragging right leg, and off balance per report. Representative in triage unable to give good history of present illness.

## 2016-05-04 NOTE — ED Provider Notes (Signed)
[facility] Emergency Department Provider Note  ____________________________________________   I have reviewed the triage vital signs and the nursing notes.   HISTORY  Chief Complaint Altered Mental Status   History limited by: Altered Mental Status, some history obtained from social worker   HPI Cheryl [name] is a 76 y.o. female who presents to the emergency department today from living facility because of concerns for altered mental status, dragging of the right leg that was noticed this morning. Patient herself is unable to give a great history. She does however deny any pain. Department of social work employee states that normally she is able to tell you the date, her name. The social worker has noticed that the patient has been having a harder time getting up and walking. They have not heard of any recent illnesses.   Past Medical History:  Diagnosis Date  . COPD (chronic obstructive pulmonary disease) ([place])   . Dementia   . Diabetes mellitus without complication ([place])   . GERD (gastroesophageal reflux disease)   . Hyperlipidemia   . Hypertension   . Lung cancer ([place])   . Mental retardation   . Schizophrenia [facility])     Patient Active Problem List   Diagnosis Date Noted  . COPD exacerbation ([place]) 01/08/2016  . Hypokalemia 02/07/2015  . Cancer of left lung ([place]) 07/25/2011    Past Surgical History:  Procedure Laterality Date  . ABDOMINAL HYSTERECTOMY      Prior to Admission medications   Medication Sig Start Date End Date Taking? Authorizing Provider  acetaminophen (TYLENOL) 325 MG tablet Take 325 mg by mouth daily as needed.     Historical Provider, MD  albuterol (PROVENTIL HFA) 108 (90 Base) MCG/ACT inhaler Inhale 2 puffs into the lungs every 4 (four) hours as needed for wheezing or shortness of breath. 01/05/16   Carrie Mew, MD  ALPRAZolam Duanne Moron) 0.25 MG tablet Take 0.25 mg by mouth 2 (two) times daily.     Historical Provider, MD   AMBULATORY NON FORMULARY MEDICATION D/C all nicotine from the above patient 01/25/16   Wilhelmina Mcardle, MD  amLODipine (NORVASC) 10 MG tablet Take 10 mg by mouth daily.     Historical Provider, MD  aspirin 325 MG tablet Take 325 mg by mouth daily.     Historical Provider, MD  atorvastatin (LIPITOR) 20 MG tablet Take 20 mg by mouth daily.    Historical Provider, MD  Dextromethorphan-Quinidine (NUEDEXTA) 20-10 MG CAPS Take 1 capsule by mouth at bedtime.     Historical Provider, MD  ferrous sulfate 325 (65 FE) MG tablet Take by mouth 2 (two) times daily with a meal.     Historical Provider, MD  Fluticasone-Salmeterol (ADVAIR) 250-50 MCG/DOSE AEPB Inhale 1 puff into the lungs 2 (two) times daily.    Historical Provider, MD  lisinopril (PRINIVIL,ZESTRIL) 10 MG tablet Take 10 mg by mouth daily.     Historical Provider, MD  magnesium hydroxide (MILK OF MAGNESIA) 400 MG/5ML suspension Take 40 mLs by mouth daily.     Historical Provider, MD  Memantine HCl-Donepezil HCl (NAMZARIC) 28-10 MG CP24 Take 1 capsule by mouth daily.    Historical Provider, MD  metFORMIN (GLUCOPHAGE) 500 MG tablet Take 500 mg by mouth 2 (two) times daily with a meal.     Historical Provider, MD  modafinil (PROVIGIL) 100 MG tablet Take 100 mg by mouth daily.     Historical Provider, MD  Multiple Vitamin (MULTI-VITAMINS) TABS Take 1 tablet by mouth daily.  Historical Provider, MD  omeprazole (PRILOSEC) 40 MG capsule Take 40 mg by mouth daily.     Historical Provider, MD  perphenazine (TRILAFON) 2 MG tablet Take 2-4 mg by mouth every 6 (six) hours as needed.     Historical Provider, MD  perphenazine (TRILAFON) 4 MG tablet Take 4 mg by mouth 3 (three) times daily.    Historical Provider, MD  QUEtiapine (SEROQUEL XR) 300 MG 24 hr tablet Take 300 mg by mouth at bedtime.    Historical Provider, MD  Vitamin D, Ergocalciferol, (DRISDOL) 50000 UNITS CAPS capsule Take 50,000 Units by mouth every 30 (thirty) days.     Historical Provider, MD     Allergies Patient has no known allergies.  History reviewed. No pertinent family history.  Social History Social History  Substance Use Topics  . Smoking status: Current Some Day Smoker    Packs/day: 0.25    Types: Cigarettes  . Smokeless tobacco: Never Used  . Alcohol use No    Review of Systems  Constitutional: Negative for fever. Cardiovascular: Negative for chest pain. Respiratory: Negative for shortness of breath. Gastrointestinal: Negative for abdominal pain, vomiting and diarrhea. Neurological: Negative for headaches, focal weakness or numbness.  10-point ROS otherwise negative.  ____________________________________________   PHYSICAL EXAM:  VITAL SIGNS: ED Triage Vitals [05/04/16 0942]  Enc Vitals Group     BP 134/75     Pulse Rate 82     Resp 14     Temp 98.7 F (37.1 C)     Temp src      SpO2 100 %     Weight 190 lb (86.2 kg)     Height     Constitutional: Awake and alert, no acute distress. Pleasant.  Eyes: Conjunctivae are normal. Normal extraocular movements. ENT   Head: Normocephalic and atraumatic.   Nose: No congestion/rhinnorhea.   Mouth/Throat: Mucous membranes are moist.   Neck: No stridor. Hematological/Lymphatic/Immunilogical: No cervical lymphadenopathy. Cardiovascular: Normal rate, regular rhythm.  No murmurs, rubs, or gallops.  Respiratory: Normal respiratory effort without tachypnea nor retractions. Breath sounds are clear and equal bilaterally. No wheezes/rales/rhonchi. Gastrointestinal: Soft and non tender. No rebound. No guarding.  Genitourinary: Deferred Musculoskeletal: Normal range of motion in all extremities. No lower extremity edema. Neurologic:  Normal speech and language. No gross focal neurologic deficits are appreciated.  Skin:  Skin is warm, dry and intact. No rash noted.  ____________________________________________    LABS (pertinent positives/negatives)  Labs Reviewed  COMPREHENSIVE METABOLIC  PANEL - Abnormal; Notable for the following:       Result Value   Glucose, Bld 206 (*)    Creatinine, Ser 1.12 (*)    GFR calc non Af Amer 47 (*)    GFR calc Af Amer 55 (*)    All other components within normal limits  CBC - Abnormal; Notable for the following:    RDW 14.7 (*)    All other components within normal limits  URINALYSIS, COMPLETE (UACMP) WITH MICROSCOPIC - Abnormal; Notable for the following:    Color, Urine AMBER (*)    APPearance HAZY (*)    Protein, ur 30 (*)    All other components within normal limits  GLUCOSE, CAPILLARY - Abnormal; Notable for the following:    Glucose-Capillary 176 (*)    All other components within normal limits  TROPONIN I  CBG MONITORING, ED     ____________________________________________   EKG  I, Nance Pear, attending physician, personally viewed and interpreted this EKG  EKG  Time: 0949 Rate: 87 Rhythm: sinus rhythm with 1st degree av block with frequent PVC Axis: normal Intervals: qtc 529 QRS: RBBB ST changes: no st elevation Impression: abnormal ekg   ____________________________________________    RADIOLOGY  CT head IMPRESSION:  1. No acute intracranial abnormalities.       ____________________________________________   PROCEDURES  Procedures  ____________________________________________   INITIAL IMPRESSION / ASSESSMENT AND PLAN / ED COURSE  Pertinent labs & imaging results that were available during my care of the patient were reviewed by me and considered in my medical decision making (see chart for details).  Patient presented to the emergency department today because of concerns for altered mental status and dragging of the right leg. Patient did not have any focal weakness on exam. Additionally when she was ambulated she did not have any apparent right foot drag. Patient's workup appears essentially normal limits. I discussed with the operator of the group home who felt comfortable taking her  back. We did discuss return precautions and follow up with primary care. He does think that this could just be her dementia causing some alteration in her mental status. Patient is certainly not in any distress. I think it is reasonable given workup to have her return to the group home.  ____________________________________________   FINAL CLINICAL IMPRESSION(S) / ED DIAGNOSES  Final diagnoses:  Dementia without behavioral disturbance, unspecified dementia type     Note: This dictation was prepared with Dragon dictation. Any transcriptional errors that result from this process are unintentional     Nance Pear, MD 05/04/16 1426

## 2016-05-19 ENCOUNTER — Encounter: Payer: Self-pay | Admitting: Hematology and Oncology

## 2016-05-30 DIAGNOSIS — R159 Full incontinence of feces: Secondary | ICD-10-CM | POA: Insufficient documentation

## 2016-08-05 ENCOUNTER — Other Ambulatory Visit: Payer: Self-pay | Admitting: Internal Medicine

## 2016-08-05 DIAGNOSIS — Z1231 Encounter for screening mammogram for malignant neoplasm of breast: Secondary | ICD-10-CM

## 2016-08-22 ENCOUNTER — Ambulatory Visit
Admission: RE | Admit: 2016-08-22 | Discharge: 2016-08-22 | Disposition: A | Discharge status: Home / Self Care | Payer: Medicare Other | Source: Ambulatory Visit | Attending: Internal Medicine | Admitting: Internal Medicine

## 2016-08-22 DIAGNOSIS — Z1231 Encounter for screening mammogram for malignant neoplasm of breast: Secondary | ICD-10-CM | POA: Diagnosis present

## 2016-10-02 ENCOUNTER — Other Ambulatory Visit: Payer: Self-pay | Admitting: Family Medicine

## 2016-10-02 DIAGNOSIS — Z13820 Encounter for screening for osteoporosis: Secondary | ICD-10-CM

## 2016-10-06 DIAGNOSIS — F03B18 Unspecified dementia, moderate, with other behavioral disturbance: Secondary | ICD-10-CM | POA: Insufficient documentation

## 2016-10-06 DIAGNOSIS — F0391 Unspecified dementia with behavioral disturbance: Secondary | ICD-10-CM | POA: Insufficient documentation

## 2016-10-08 ENCOUNTER — Other Ambulatory Visit: Payer: Self-pay | Admitting: Neurology

## 2016-10-08 ENCOUNTER — Ambulatory Visit
Admission: RE | Admit: 2016-10-08 | Discharge: 2016-10-08 | Disposition: A | Discharge status: Home / Self Care | Payer: Medicare Other | Source: Ambulatory Visit | Attending: Hematology and Oncology | Admitting: Hematology and Oncology

## 2016-10-08 DIAGNOSIS — I700 Atherosclerosis of aorta: Secondary | ICD-10-CM | POA: Insufficient documentation

## 2016-10-08 DIAGNOSIS — R590 Localized enlarged lymph nodes: Secondary | ICD-10-CM | POA: Diagnosis not present

## 2016-10-08 DIAGNOSIS — Y842 Radiological procedure and radiotherapy as the cause of abnormal reaction of the patient, or of later complication, without mention of misadventure at the time of the procedure: Secondary | ICD-10-CM | POA: Diagnosis not present

## 2016-10-08 DIAGNOSIS — F0391 Unspecified dementia with behavioral disturbance: Secondary | ICD-10-CM

## 2016-10-08 DIAGNOSIS — I2510 Atherosclerotic heart disease of native coronary artery without angina pectoris: Secondary | ICD-10-CM | POA: Insufficient documentation

## 2016-10-08 DIAGNOSIS — J439 Emphysema, unspecified: Secondary | ICD-10-CM | POA: Diagnosis not present

## 2016-10-08 DIAGNOSIS — C3432 Malignant neoplasm of lower lobe, left bronchus or lung: Secondary | ICD-10-CM | POA: Diagnosis present

## 2016-10-08 DIAGNOSIS — F03B18 Unspecified dementia, moderate, with other behavioral disturbance: Secondary | ICD-10-CM

## 2016-10-13 ENCOUNTER — Other Ambulatory Visit: Payer: Self-pay | Admitting: *Deleted

## 2016-10-13 ENCOUNTER — Encounter: Payer: Self-pay | Admitting: Hematology and Oncology

## 2016-10-13 ENCOUNTER — Inpatient Hospital Stay (HOSPITAL_BASED_OUTPATIENT_CLINIC_OR_DEPARTMENT_OTHER): Payer: Medicare Other | Admitting: Hematology and Oncology

## 2016-10-13 ENCOUNTER — Inpatient Hospital Stay: Payer: Medicare Other | Attending: Hematology and Oncology

## 2016-10-13 VITALS — BP 149/85 | HR 102 | Resp 18 | Wt 189.9 lb

## 2016-10-13 DIAGNOSIS — Z79899 Other long term (current) drug therapy: Secondary | ICD-10-CM | POA: Diagnosis not present

## 2016-10-13 DIAGNOSIS — I2510 Atherosclerotic heart disease of native coronary artery without angina pectoris: Secondary | ICD-10-CM | POA: Diagnosis not present

## 2016-10-13 DIAGNOSIS — Z7984 Long term (current) use of oral hypoglycemic drugs: Secondary | ICD-10-CM | POA: Diagnosis not present

## 2016-10-13 DIAGNOSIS — J449 Chronic obstructive pulmonary disease, unspecified: Secondary | ICD-10-CM | POA: Insufficient documentation

## 2016-10-13 DIAGNOSIS — E785 Hyperlipidemia, unspecified: Secondary | ICD-10-CM | POA: Diagnosis not present

## 2016-10-13 DIAGNOSIS — F17210 Nicotine dependence, cigarettes, uncomplicated: Secondary | ICD-10-CM | POA: Insufficient documentation

## 2016-10-13 DIAGNOSIS — F79 Unspecified intellectual disabilities: Secondary | ICD-10-CM | POA: Diagnosis not present

## 2016-10-13 DIAGNOSIS — K7689 Other specified diseases of liver: Secondary | ICD-10-CM | POA: Diagnosis not present

## 2016-10-13 DIAGNOSIS — E119 Type 2 diabetes mellitus without complications: Secondary | ICD-10-CM | POA: Insufficient documentation

## 2016-10-13 DIAGNOSIS — I10 Essential (primary) hypertension: Secondary | ICD-10-CM | POA: Insufficient documentation

## 2016-10-13 DIAGNOSIS — F0390 Unspecified dementia without behavioral disturbance: Secondary | ICD-10-CM | POA: Insufficient documentation

## 2016-10-13 DIAGNOSIS — K219 Gastro-esophageal reflux disease without esophagitis: Secondary | ICD-10-CM | POA: Diagnosis not present

## 2016-10-13 DIAGNOSIS — Z923 Personal history of irradiation: Secondary | ICD-10-CM

## 2016-10-13 DIAGNOSIS — C3432 Malignant neoplasm of lower lobe, left bronchus or lung: Secondary | ICD-10-CM | POA: Diagnosis present

## 2016-10-13 DIAGNOSIS — Z7982 Long term (current) use of aspirin: Secondary | ICD-10-CM | POA: Insufficient documentation

## 2016-10-13 DIAGNOSIS — Z85118 Personal history of other malignant neoplasm of bronchus and lung: Secondary | ICD-10-CM

## 2016-10-13 DIAGNOSIS — F209 Schizophrenia, unspecified: Secondary | ICD-10-CM | POA: Diagnosis not present

## 2016-10-13 LAB — CBC WITH DIFFERENTIAL/PLATELET
Basophils Absolute: 0 10*3/uL (ref 0–0.1)
Basophils Relative: 0 %
Eosinophils Absolute: 0.3 10*3/uL (ref 0–0.7)
Eosinophils Relative: 4 %
HCT: 37.3 % (ref 35.0–47.0)
Hemoglobin: 12.5 g/dL (ref 12.0–16.0)
Lymphocytes Relative: 23 %
Lymphs Abs: 1.5 10*3/uL (ref 1.0–3.6)
MCH: 28 pg (ref 26.0–34.0)
MCHC: 33.4 g/dL (ref 32.0–36.0)
MCV: 83.6 fL (ref 80.0–100.0)
Monocytes Absolute: 0.6 10*3/uL (ref 0.2–0.9)
Monocytes Relative: 9 %
Neutro Abs: 4.3 10*3/uL (ref 1.4–6.5)
Neutrophils Relative %: 64 %
Platelets: 329 10*3/uL (ref 150–440)
RBC: 4.46 MIL/uL (ref 3.80–5.20)
RDW: 14.2 % (ref 11.5–14.5)
WBC: 6.7 10*3/uL (ref 3.6–11.0)

## 2016-10-13 LAB — COMPREHENSIVE METABOLIC PANEL
ALT: 19 U/L (ref 14–54)
AST: 27 U/L (ref 15–41)
Albumin: 4.1 g/dL (ref 3.5–5.0)
Alkaline Phosphatase: 89 U/L (ref 38–126)
Anion gap: 9 (ref 5–15)
BUN: 19 mg/dL (ref 6–20)
CO2: 26 mmol/L (ref 22–32)
Calcium: 9.2 mg/dL (ref 8.9–10.3)
Chloride: 106 mmol/L (ref 101–111)
Creatinine, Ser: 1.08 mg/dL — ABNORMAL HIGH (ref 0.44–1.00)
GFR calc Af Amer: 57 mL/min — ABNORMAL LOW (ref >60)
GFR calc non Af Amer: 49 mL/min — ABNORMAL LOW (ref >60)
Glucose, Bld: 138 mg/dL — ABNORMAL HIGH (ref 65–99)
Potassium: 4.2 mmol/L (ref 3.5–5.1)
Sodium: 141 mmol/L (ref 135–145)
Total Bilirubin: 0.3 mg/dL (ref 0.3–1.2)
Total Protein: 7.9 g/dL (ref 6.5–8.1)

## 2016-10-13 NOTE — Progress Notes (Signed)
[facility] day:  10/13/2016  Chief Complaint: Cheryl [name] is a 76 y.o. 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  HPI:  The patient was last seen in the medical oncology clinic on 04/07/2016.  At that time, she denied any complaint.  Exam was stable.  She was not smoking.  Hematocrit was 34.3 with a hemoglobin of 11.5 and MCV 83.5.  CMP and CEA were normal.  Chest CT on 10/08/2016 revealed stable post treatment changes secondary to external beam radiation. There were no findings identified to suggest residual or recurrence of tumor.  There was a 9 mm paratracheal lymph nodes are stable from previous exam.  There was aortic atherosclerosis, emphysema, and multi vessel coronary artery calcification.  Symptomatically, patient doing well. She denies any physical complaints. Patient denies any shortness of breath or hemoptysis. No B symptoms. Patient continues to eat well. She has gained 2 pounds. Patient is not smoking at this point.    Past Medical History:  Diagnosis Date  . COPD (chronic obstructive pulmonary disease) ([place])   . Dementia   . Diabetes mellitus without complication ([place])   . GERD (gastroesophageal reflux disease)   . Hyperlipidemia   . Hypertension   . Lung cancer ([place])   . Mental retardation   . Schizophrenia [facility])     Past Surgical History:  Procedure Laterality Date  . ABDOMINAL HYSTERECTOMY      Family History  Problem Relation Age of Onset  . Breast cancer Neg Hx     Social History:  reports that she has been smoking Cigarettes.  She has been smoking about 0.25 packs per day. She has never used smokeless tobacco. She reports that she does not drink alcohol or use drugs.  She was smoking 6-8 cigarettes a day (1 every 2 hours).  She no longer smokes.  She resides in a group home [facility]).  She is able to care for 80% of her ADLs.  The patient is accompanied by Joycelyn Schmid,  from the [facility] group home, today.  Allergies: No Known Allergies  Current Medications: Current Outpatient Prescriptions  Medication Sig Dispense Refill  . acetaminophen (TYLENOL) 325 MG tablet Take 325 mg by mouth daily as needed.     Marland Kitchen albuterol (PROVENTIL HFA) 108 (90 Base) MCG/ACT inhaler Inhale 2 puffs into the lungs every 4 (four) hours as needed for wheezing or shortness of breath. 1 Inhaler 0  . ALPRAZolam (XANAX) 0.25 MG tablet Take 0.25 mg by mouth 2 (two) times daily.     Marland Kitchen amLODipine (NORVASC) 10 MG tablet Take 10 mg by mouth daily.     Marland Kitchen aspirin 325 MG tablet Take 325 mg by mouth daily.     Marland Kitchen atorvastatin (LIPITOR) 20 MG tablet Take 20 mg by mouth daily.    . ferrous sulfate 325 (65 FE) MG tablet Take by mouth 2 (two) times daily with a meal.     . lisinopril (PRINIVIL,ZESTRIL) 10 MG tablet Take 10 mg by mouth daily.     . magnesium hydroxide (MILK OF MAGNESIA) 400 MG/5ML suspension Take 40 mLs by mouth daily.     . Memantine HCl-Donepezil HCl (NAMZARIC) 28-10 MG CP24 Take 1 capsule by mouth daily.    . metFORMIN (GLUCOPHAGE) 500 MG tablet Take 500 mg by mouth 2 (two) times daily with a meal.     . modafinil (PROVIGIL) 100 MG tablet Take 100 mg by  mouth daily.     . Multiple Vitamin (MULTI-VITAMINS) TABS Take 1 tablet by mouth daily.     Marland Kitchen omeprazole (PRILOSEC) 40 MG capsule Take 40 mg by mouth daily.     Marland Kitchen perphenazine (TRILAFON) 4 MG tablet Take 4 mg by mouth 3 (three) times daily.    . QUEtiapine (SEROQUEL XR) 300 MG 24 hr tablet Take 300 mg by mouth at bedtime.    . Vitamin D, Ergocalciferol, (DRISDOL) 50000 UNITS CAPS capsule Take 50,000 Units by mouth every 30 (thirty) days.      No current facility-administered medications for this visit.     Review of Systems:  GENERAL:  Feels "ok".  No fevers or sweats.  Weight gain of 2 pounds. PERFORMANCE STATUS (ECOG):  3 HEENT:  No visual changes, runny nose, sore throat, mouth sores or tenderness. Lungs: No shortness of breath  or cough.  No hemoptysis. Cardiac:  No chest pain, palpitations, orthopnea, or PND. GI:  No nausea, vomiting, diarrhea, constipation, melena or hematochezia. GU:  Incontinent.  No urgency, frequency, dysuria, or hematuria. Musculoskeletal:  No back pain.  No joint pain.  No muscle tenderness. Extremities:  No pain or swelling. Skin:  No rashes or skin changes. Neuro:  Mental retardation.  No headache, numbness or weakness, balance or coordination issues. Endocrine:  Diabetes.  No thyroid issues, hot flashes or night sweats. Psych:  No mood changes, depression or anxiety. Pain:  No focal pain. Review of systems:  All other systems reviewed and found to be negative.  Physical Exam: Weight 189 lb 14.4 oz (86.1 kg). GENERAL:  Well developed, well nourished, woman sitting comfortably in the exam room in no acute distress. MENTAL STATUS:  Alert and oriented to person, place and time. HEAD:  Short gray hair.  Normocephalic, atraumatic, face symmetric, no Cushingoid features. EYES:  Glasses.  Brown eyes.  Bilateral arcus.  Pupils equal round and reactive to light and accomodation.  No conjunctivitis or scleral icterus. ENT:  Oropharynx clear without lesion.  Tongue normal. Mucous membranes moist.  RESPIRATORY:  Clear to auscultation without rales, wheezes or rhonchi. CARDIOVASCULAR:  Regular rate and rhythm without murmur, rub or gallop. ABDOMEN:  Soft, non-tender, with active bowel sounds, and no hepatosplenomegaly.  No masses. SKIN:  Lipomatous lesion to RIGHT scapula. No other rashes, ulcers or lesions. EXTREMITIES: No edema, no skin discoloration or tenderness.  No palpable cords. LYMPH NODES: No palpable cervical, supraclavicular, axillary or inguinal adenopathy  NEUROLOGICAL: Mentally slow. PSYCH:  Appropriate.   Appointment on 10/13/2016  Component Date Value Ref Range Status  . Sodium 10/13/2016 141  135 - 145 mmol/L Final  . Potassium 10/13/2016 4.2  3.5 - 5.1 mmol/L Final  .  Chloride 10/13/2016 106  101 - 111 mmol/L Final  . CO2 10/13/2016 26  22 - 32 mmol/L Final  . Glucose, Bld 10/13/2016 138* 65 - 99 mg/dL Final  . BUN 10/13/2016 19  6 - 20 mg/dL Final  . Creatinine, Ser 10/13/2016 1.08* 0.44 - 1.00 mg/dL Final  . Calcium 10/13/2016 9.2  8.9 - 10.3 mg/dL Final  . Total Protein 10/13/2016 7.9  6.5 - 8.1 g/dL Final  . Albumin 10/13/2016 4.1  3.5 - 5.0 g/dL Final  . AST 10/13/2016 27  15 - 41 U/L Final  . ALT 10/13/2016 19  14 - 54 U/L Final  . Alkaline Phosphatase 10/13/2016 89  38 - 126 U/L Final  . Total Bilirubin 10/13/2016 0.3  0.3 - 1.2 mg/dL Final  .  GFR calc non Af Amer 10/13/2016 49* >60 mL/min Final  . GFR calc Af Amer 10/13/2016 57* >60 mL/min Final   Comment: (NOTE) The eGFR has been calculated using the CKD EPI equation. This calculation has not been validated in all clinical situations. eGFR's persistently <60 mL/min signify possible Chronic Kidney Disease.   . Anion gap 10/13/2016 9  5 - 15 Final  . WBC 10/13/2016 6.7  3.6 - 11.0 K/uL Final  . RBC 10/13/2016 4.46  3.80 - 5.20 MIL/uL Final  . Hemoglobin 10/13/2016 12.5  12.0 - 16.0 g/dL Final  . HCT 10/13/2016 37.3  35.0 - 47.0 % Final  . MCV 10/13/2016 83.6  80.0 - 100.0 fL Final  . MCH 10/13/2016 28.0  26.0 - 34.0 pg Final  . MCHC 10/13/2016 33.4  32.0 - 36.0 g/dL Final  . RDW 10/13/2016 14.2  11.5 - 14.5 % Final  . Platelets 10/13/2016 329  150 - 440 K/uL Final  . Neutrophils Relative % 10/13/2016 64  % Final  . Neutro Abs 10/13/2016 4.3  1.4 - 6.5 K/uL Final  . Lymphocytes Relative 10/13/2016 23  % Final  . Lymphs Abs 10/13/2016 1.5  1.0 - 3.6 K/uL Final  . Monocytes Relative 10/13/2016 9  % Final  . Monocytes Absolute 10/13/2016 0.6  0.2 - 0.9 K/uL Final  . Eosinophils Relative 10/13/2016 4  % Final  . Eosinophils Absolute 10/13/2016 0.3  0 - 0.7 K/uL Final  . Basophils Relative 10/13/2016 0  % Final  . Basophils Absolute 10/13/2016 0.0  0 - 0.1 K/uL Final    Assessment:   Graelyn Bihl is a 76 y.o. 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  Chest CT on 07/10/2011 revealed a 4.7 x 3.4 cm pleural-based mass in the left lower lobe.  CT-guided fine-needle aspiration on 07/25/2011 revealed non-small cell carcinoma, favoring adenocarcinoma.    PET scan on 07/11/2011 revealed hypermetabolic subpleural mass in the left lower lobe and mildly hypermetabolic left-sided mediastinal lymph nodes.  There was no evidence of FDG avid metastatic disease in the abdomen or pelvis.  Clinical stage was T2N2M0 (stage IIIA).   She received 4,000 cGy from 08/13/2011 - 09/09/2011 in a large field and 3,000 cGy in a small field boost from 10/13/2011 - 11/03/2011.  She did not receive chemotherapy secondary to her co-morbidities and poor performance status.  PET scan on 08/12/2012 revealed resolution of the PET positive mass.  Chest CT on 09/05/2015 revealed stable postradiation changes in the left lung. There was no evidence of recurrent or metastatic disease in the chest.  There were stable hepatic cysts.  Chest CT angiogram on 01/05/2016 revealed no evidence of pulmonary embolism.  Respiratory motion artifacts limited the exam.  There was scattered patchy infiltrates in RIGHT lung with areas of scarring in LEFT upper lobe question related to prior treatment for lung cancer.  Chest CT on 10/08/2016 revealed stable post treatment changes. There were no findings of residual or recurrence of tumor.  There was a 9 mm paratracheal lymph nodes, stable.    She has mental retardation.  She lives in a group home.  Symptomatically, she denies any complaint.  Exam reveals a lipomatous growth to her RIGHT scapular.  She is not smoking. Labs unremarkable.   Plan: 1.  Labs today:  CBC with diff, CMP, CEA. 2.  Review interval chest CT- no evidence of recurrent disease. 3.  Schedule CT scan of chest in 1 year (10/08/2017). 4.  RTC in 1 year for MD assessment, labs (CBC with  diff, CMP, CEA).   Honor Loh, NP  10/13/2016, 2:22 PM   I saw and evaluated the patient, participating in the key portions of the service and reviewing pertinent diagnostic studies and records.  I reviewed the nurse practitioner's note and agree with the findings and the plan.  The assessment and plan were discussed with the patient.  A few questions were asked by the caregiver and answered.   Nolon Stalls, MD 10/13/2016, 2:22 PM

## 2016-10-14 ENCOUNTER — Other Ambulatory Visit: Payer: Self-pay | Admitting: *Deleted

## 2016-10-14 DIAGNOSIS — Z85118 Personal history of other malignant neoplasm of bronchus and lung: Secondary | ICD-10-CM

## 2016-10-14 LAB — CEA: CEA: 4.9 ng/mL — ABNORMAL HIGH (ref 0.0–4.7)

## 2016-10-15 ENCOUNTER — Telehealth: Payer: Self-pay | Admitting: *Deleted

## 2016-10-15 NOTE — Telephone Encounter (Signed)
Called number for patient.  Phone was answered by Dr. Merry Proud.  Patient lives in a facility.  Left message for RN to return my call.  Per Dr. Owens Shark, the nurse is on her way to facility and will call when she arrives.

## 2016-10-15 NOTE — Telephone Encounter (Signed)
Called back to facility and spoke to Isac Caddy, RN to inform her that patient's CEA has increased.  Also asked if patient is having any symptoms and when she had her last colonoscopy.  Per Lars Mage, patient has been incontinent of stool.  She will call me back with last colonoscopy date.

## 2016-10-15 NOTE — Telephone Encounter (Signed)
-----   Message from Lequita Asal, MD sent at 10/14/2016 10:58 AM EDT ----- Regarding: Please call patient  CEA is slightly elevated.  Any symptoms?  Last colonoscopy?  Would recheck in a month.  M  ----- Message ----- From: Interface, Lab In [place] Sent: 10/13/2016   2:06 PM To: Lequita Asal, MD

## 2016-10-15 NOTE — Telephone Encounter (Signed)
-----   Message from Lequita Asal, MD sent at 10/14/2016 10:58 AM EDT ----- Regarding: Please call patient  CEA is slightly elevated.  Any symptoms?  Last colonoscopy?  Would recheck in a month.  M  ----- Message ----- From: Interface, Lab In [place] Sent: 10/13/2016   2:06 PM To: Lequita Asal, MD

## 2016-10-16 ENCOUNTER — Ambulatory Visit
Admission: RE | Admit: 2016-10-16 | Discharge: 2016-10-16 | Disposition: A | Discharge status: Home / Self Care | Payer: Medicare Other | Source: Ambulatory Visit | Attending: Neurology | Admitting: Neurology

## 2016-10-16 DIAGNOSIS — F03B18 Unspecified dementia, moderate, with other behavioral disturbance: Secondary | ICD-10-CM

## 2016-10-16 DIAGNOSIS — I6782 Cerebral ischemia: Secondary | ICD-10-CM | POA: Diagnosis not present

## 2016-10-16 DIAGNOSIS — F0391 Unspecified dementia with behavioral disturbance: Secondary | ICD-10-CM | POA: Diagnosis not present

## 2016-11-11 ENCOUNTER — Ambulatory Visit
Admission: RE | Admit: 2016-11-11 | Discharge: 2016-11-11 | Disposition: A | Discharge status: Home / Self Care | Payer: Medicare Other | Source: Ambulatory Visit | Attending: Family Medicine | Admitting: Family Medicine

## 2016-11-11 DIAGNOSIS — Z780 Asymptomatic menopausal state: Secondary | ICD-10-CM | POA: Insufficient documentation

## 2016-11-11 DIAGNOSIS — M85852 Other specified disorders of bone density and structure, left thigh: Secondary | ICD-10-CM | POA: Diagnosis not present

## 2016-11-11 DIAGNOSIS — C3490 Malignant neoplasm of unspecified part of unspecified bronchus or lung: Secondary | ICD-10-CM | POA: Insufficient documentation

## 2016-11-11 DIAGNOSIS — J449 Chronic obstructive pulmonary disease, unspecified: Secondary | ICD-10-CM | POA: Insufficient documentation

## 2016-11-11 DIAGNOSIS — F0390 Unspecified dementia without behavioral disturbance: Secondary | ICD-10-CM | POA: Diagnosis not present

## 2016-11-11 DIAGNOSIS — Z13820 Encounter for screening for osteoporosis: Secondary | ICD-10-CM | POA: Diagnosis present

## 2016-11-11 DIAGNOSIS — R29890 Loss of height: Secondary | ICD-10-CM | POA: Diagnosis not present

## 2016-12-22 ENCOUNTER — Ambulatory Visit: Payer: Medicare Other | Admitting: Pulmonary Disease

## 2017-04-27 ENCOUNTER — Ambulatory Visit: Payer: Medicare Other | Admitting: Pulmonary Disease

## 2017-04-30 ENCOUNTER — Encounter: Payer: Self-pay | Admitting: Pulmonary Disease

## 2017-04-30 ENCOUNTER — Ambulatory Visit (INDEPENDENT_AMBULATORY_CARE_PROVIDER_SITE_OTHER): Payer: Medicare Other | Admitting: Pulmonary Disease

## 2017-04-30 VITALS — BP 128/80 | HR 94 | Ht 64.0 in | Wt 194.0 lb

## 2017-04-30 DIAGNOSIS — J449 Chronic obstructive pulmonary disease, unspecified: Secondary | ICD-10-CM | POA: Diagnosis not present

## 2017-04-30 DIAGNOSIS — Z87891 Personal history of nicotine dependence: Secondary | ICD-10-CM

## 2017-04-30 MED ORDER — ALBUTEROL SULFATE HFA 108 (90 BASE) MCG/ACT IN AERS: 2.0000 | INHALATION_SPRAY | RESPIRATORY_TRACT | 0 refills | Status: DC | PRN

## 2017-04-30 NOTE — Progress Notes (Signed)
PULMONARY OFFICE FOLLOW-UP NOTE  Requesting MD/Service: Max Sane, MD Date of initial consultation: 01/17/16 Reason for consultation: COPD   PT PROFILE: 76 y.o. F former smoker (quit Aug 2018) with cognitive impairment (lives in group home) hospitalized 12/26-12/28/17 for COPD exacerbation under care of hospitalist service. Remote history of lung cancer treated > 5 yrs  DATA: CT chest 01/05/16: Numerous scattered motion artifacts at the pulmonary arteries without definite pulmonary emboli identified. Scattered patchy infiltrates in R lung with areas of scarring in LUL question related to prior treatment for lung cancer. Large hiatal hernia CT chest 10/08/16: NSC chronic post-XRT changes in LLL and COPD changes  INTERVAL: Last seen 01/2016. No major pulmonary events in interim  SUBJ:  Routine re-evaluation. Has quit smoking (08/2016).  She returns because of a call back from our office.  He is here with a care provider who provides most of the history.  There have been no major respiratory problems and the patient and her care provider deny significant exertional dyspnea. Also deny CP, fever, purulent sputum, hemoptysis, LE edema and calf tenderness.   Vitals:   04/30/17 0957  Weight: 194 lb (88 kg)  Height: 5\' 4"  (1.626 m)  RA  EXAM:  Gen: NAD HEENT: NCAT, sclera white Neck: No JVD Lungs: breath sounds full, no wheezes or other adventitious sounds Cardiovascular: RRR, no murmurs Abdomen: Soft, nontender, normal BS Ext: without clubbing, cyanosis, edema Neuro: grossly intact Skin: Limited exam, no lesions noted   DATA:   BMP Latest Ref Rng & Units 10/13/2016 05/04/2016 04/07/2016  Glucose 65 - 99 mg/dL 138(H) 206(H) 129(H)  BUN 6 - 20 mg/dL 19 16 23(H)  Creatinine 0.44 - 1.00 mg/dL 1.08(H) 1.12(H) 1.14(H)  Sodium 135 - 145 mmol/L 141 141 139  Potassium 3.5 - 5.1 mmol/L 4.2 3.6 3.8  Chloride 101 - 111 mmol/L 106 103 107  CO2 22 - 32 mmol/L 26 28 26   Calcium 8.9 - 10.3  mg/dL 9.2 9.4 9.0    CBC Latest Ref Rng & Units 10/13/2016 05/04/2016 04/07/2016  WBC 3.6 - 11.0 K/uL 6.7 6.3 5.7  Hemoglobin 12.0 - 16.0 g/dL 12.5 12.8 11.5(L)  Hematocrit 35.0 - 47.0 % 37.3 39.7 34.3(L)  Platelets 150 - 440 K/uL 329 301 268    CXR: No recent film  IMPRESSION:     ICD-10-CM   1. Chronic obstructive pulmonary disease -minimally limiting at this time J44.9   2. Former smoker Z87.891     PLAN:  1) continue Advair inhaler, 1 inhalation twice daily -prescription updated 2) continue albuterol inhaler as needed with spacer device -prescription updated 3) congratulated her on her success at smoking cessation 4) follow-up as needed.  Henceforth, her inhaler medicines can be prescribed by her primary care physician   Merton Border, MD PCCM service Mobile [phone] Pager [phone] 04/30/2017 10:31 AM

## 2017-04-30 NOTE — Patient Instructions (Addendum)
Continue Advair inhaler, 1 inhalation twice a day Begin albuterol rescue inhaler, 1-2 actuations as needed for increased shortness of breath, wheezing  Use with spacer device for more effective delivery to lungs These medications can be refilled by primary care physician in the future  Follow-up as needed

## 2017-07-20 ENCOUNTER — Ambulatory Visit (INDEPENDENT_AMBULATORY_CARE_PROVIDER_SITE_OTHER): Payer: Medicare Other | Admitting: Pulmonary Disease

## 2017-07-20 ENCOUNTER — Encounter: Payer: Self-pay | Admitting: Pulmonary Disease

## 2017-07-20 VITALS — BP 122/78 | HR 97 | Ht 64.0 in | Wt 191.0 lb

## 2017-07-20 DIAGNOSIS — Z87891 Personal history of nicotine dependence: Secondary | ICD-10-CM | POA: Diagnosis not present

## 2017-07-20 DIAGNOSIS — J449 Chronic obstructive pulmonary disease, unspecified: Secondary | ICD-10-CM

## 2017-07-20 DIAGNOSIS — J3489 Other specified disorders of nose and nasal sinuses: Secondary | ICD-10-CM | POA: Diagnosis not present

## 2017-07-20 MED ORDER — FLUTICASONE PROPIONATE 50 MCG/ACT NA SUSP: 2.0000 | Freq: Every day | NASAL | 10 refills | Status: DC

## 2017-07-20 NOTE — Patient Instructions (Signed)
Continue Advair inhaler, 1 inhalation twice a day.  Rinse mouth after use  Begin Flonase nasal inhaler, 2 sprays per nostril daily  Follow-up only as needed

## 2017-07-20 NOTE — Progress Notes (Signed)
PULMONARY OFFICE FOLLOW-UP NOTE  Requesting MD/Service: Max Sane, MD Date of initial consultation: 01/17/16 Reason for consultation: COPD   PT PROFILE: 77 y.o. F former smoker (quit Aug 2018) with cognitive impairment (lives in group home) hospitalized 12/26-12/28/17 for COPD exacerbation under care of hospitalist service. Remote history of lung cancer treated > 5 yrs  DATA: CT chest 01/05/16: Numerous scattered motion artifacts at the pulmonary arteries without definite pulmonary emboli identified. Scattered patchy infiltrates in R lung with areas of scarring in LUL question related to prior treatment for lung cancer. Large hiatal hernia CT chest 10/08/16: NSC chronic post-XRT changes in LLL and COPD changes  INTERVAL: Last seen 04/30/16. No major pulmonary events in interim  SUBJ:  This is a scheduled follow-up appointment.  She remains abstinent from cigarettes.  She remains on Advair.  She rarely uses her albuterol rescue inhaler.  She has minimal exertional dyspnea..  She denies CP, fever, purulent sputum, hemoptysis, LE edema and calf tenderness.  The care provider who accompanies her reports that she has constant "runny nose".  Vitals:   07/20/17 0958 07/20/17 1002  BP:  122/78  Pulse:  97  SpO2:  98%  Weight: 191 lb (86.6 kg)   Height: 5\' 4"  (1.626 m)   RA  EXAM:  Gen: NAD HEENT: NCAT, sclera white Neck: No JVD Lungs: breath sounds full, no wheezes or other adventitious sounds Cardiovascular: RRR, no murmurs Abdomen: Soft, nontender, normal BS Ext: without clubbing, cyanosis, edema Neuro: grossly intact Skin: Limited exam, no lesions noted    DATA:   BMP Latest Ref Rng & Units 10/13/2016 05/04/2016 04/07/2016  Glucose 65 - 99 mg/dL 138(H) 206(H) 129(H)  BUN 6 - 20 mg/dL 19 16 23(H)  Creatinine 0.44 - 1.00 mg/dL 1.08(H) 1.12(H) 1.14(H)  Sodium 135 - 145 mmol/L 141 141 139  Potassium 3.5 - 5.1 mmol/L 4.2 3.6 3.8  Chloride 101 - 111 mmol/L 106 103 107  CO2 22 -  32 mmol/L 26 28 26   Calcium 8.9 - 10.3 mg/dL 9.2 9.4 9.0    CBC Latest Ref Rng & Units 10/13/2016 05/04/2016 04/07/2016  WBC 3.6 - 11.0 K/uL 6.7 6.3 5.7  Hemoglobin 12.0 - 16.0 g/dL 12.5 12.8 11.5(L)  Hematocrit 35.0 - 47.0 % 37.3 39.7 34.3(L)  Platelets 150 - 440 K/uL 329 301 268    CXR: No new film  IMPRESSION:     ICD-10-CM   1. COPD, minmally limiting J44.9   2. Former smoker Z87.891   3. Rhinorrhea J34.89     PLAN:  Continue Advair inhaler, 1 inhalation twice a day.  Rinse mouth after use  Continue albuterol inhaler as needed for increased shortness of breath, wheezing, chest tightness, cough  Begin Flonase nasal inhaler, 2 sprays per nostril daily  Follow-up only as needed   Merton Border, MD PCCM service Mobile [phone] Pager [phone] 07/20/2017 12:44 PM

## 2017-10-13 ENCOUNTER — Ambulatory Visit
Admission: RE | Admit: 2017-10-13 | Discharge: 2017-10-13 | Disposition: A | Discharge status: Home / Self Care | Payer: Medicare Other | Source: Ambulatory Visit | Attending: Urgent Care | Admitting: Urgent Care

## 2017-10-13 DIAGNOSIS — I700 Atherosclerosis of aorta: Secondary | ICD-10-CM | POA: Insufficient documentation

## 2017-10-13 DIAGNOSIS — J984 Other disorders of lung: Secondary | ICD-10-CM | POA: Insufficient documentation

## 2017-10-13 DIAGNOSIS — C3432 Malignant neoplasm of lower lobe, left bronchus or lung: Secondary | ICD-10-CM | POA: Diagnosis present

## 2017-10-13 LAB — POCT I-STAT CREATININE: Creatinine, Ser: 1 mg/dL (ref 0.44–1.00)

## 2017-10-13 MED ORDER — IOHEXOL 300 MG/ML  SOLN
75.0000 mL | Freq: Once | INTRAMUSCULAR | Status: AC | PRN
  Administered 2017-10-13: 75 mL via INTRAVENOUS

## 2017-10-19 ENCOUNTER — Encounter: Payer: Self-pay | Admitting: Hematology and Oncology

## 2017-10-19 ENCOUNTER — Inpatient Hospital Stay: Payer: Medicare Other | Attending: Hematology and Oncology

## 2017-10-19 ENCOUNTER — Inpatient Hospital Stay (HOSPITAL_BASED_OUTPATIENT_CLINIC_OR_DEPARTMENT_OTHER): Payer: Medicare Other | Admitting: Hematology and Oncology

## 2017-10-19 ENCOUNTER — Other Ambulatory Visit: Payer: Self-pay

## 2017-10-19 VITALS — BP 137/80 | HR 94 | Temp 97.8°F | Resp 18 | Wt 193.7 lb

## 2017-10-19 DIAGNOSIS — J449 Chronic obstructive pulmonary disease, unspecified: Secondary | ICD-10-CM

## 2017-10-19 DIAGNOSIS — Z85118 Personal history of other malignant neoplasm of bronchus and lung: Secondary | ICD-10-CM

## 2017-10-19 DIAGNOSIS — E119 Type 2 diabetes mellitus without complications: Secondary | ICD-10-CM | POA: Diagnosis not present

## 2017-10-19 DIAGNOSIS — Z79899 Other long term (current) drug therapy: Secondary | ICD-10-CM | POA: Insufficient documentation

## 2017-10-19 DIAGNOSIS — K7689 Other specified diseases of liver: Secondary | ICD-10-CM | POA: Insufficient documentation

## 2017-10-19 DIAGNOSIS — I10 Essential (primary) hypertension: Secondary | ICD-10-CM | POA: Diagnosis not present

## 2017-10-19 DIAGNOSIS — F209 Schizophrenia, unspecified: Secondary | ICD-10-CM | POA: Diagnosis not present

## 2017-10-19 DIAGNOSIS — Z87891 Personal history of nicotine dependence: Secondary | ICD-10-CM | POA: Diagnosis not present

## 2017-10-19 DIAGNOSIS — C3432 Malignant neoplasm of lower lobe, left bronchus or lung: Secondary | ICD-10-CM

## 2017-10-19 DIAGNOSIS — F79 Unspecified intellectual disabilities: Secondary | ICD-10-CM | POA: Insufficient documentation

## 2017-10-19 DIAGNOSIS — Z7984 Long term (current) use of oral hypoglycemic drugs: Secondary | ICD-10-CM | POA: Insufficient documentation

## 2017-10-19 DIAGNOSIS — K219 Gastro-esophageal reflux disease without esophagitis: Secondary | ICD-10-CM | POA: Diagnosis not present

## 2017-10-19 DIAGNOSIS — F0390 Unspecified dementia without behavioral disturbance: Secondary | ICD-10-CM | POA: Insufficient documentation

## 2017-10-19 DIAGNOSIS — Z7982 Long term (current) use of aspirin: Secondary | ICD-10-CM | POA: Insufficient documentation

## 2017-10-19 DIAGNOSIS — Z923 Personal history of irradiation: Secondary | ICD-10-CM

## 2017-10-19 DIAGNOSIS — E785 Hyperlipidemia, unspecified: Secondary | ICD-10-CM | POA: Diagnosis not present

## 2017-10-19 LAB — COMPREHENSIVE METABOLIC PANEL
ALT: 20 U/L (ref 0–44)
AST: 23 U/L (ref 15–41)
Albumin: 4.1 g/dL (ref 3.5–5.0)
Alkaline Phosphatase: 77 U/L (ref 38–126)
Anion gap: 8 (ref 5–15)
BUN: 22 mg/dL (ref 8–23)
CO2: 25 mmol/L (ref 22–32)
Calcium: 9.3 mg/dL (ref 8.9–10.3)
Chloride: 108 mmol/L (ref 98–111)
Creatinine, Ser: 1.14 mg/dL — ABNORMAL HIGH (ref 0.44–1.00)
GFR calc Af Amer: 53 mL/min — ABNORMAL LOW (ref >60)
GFR calc non Af Amer: 46 mL/min — ABNORMAL LOW (ref >60)
Glucose, Bld: 146 mg/dL — ABNORMAL HIGH (ref 70–99)
Potassium: 3.9 mmol/L (ref 3.5–5.1)
Sodium: 141 mmol/L (ref 135–145)
Total Bilirubin: 0.4 mg/dL (ref 0.3–1.2)
Total Protein: 7.4 g/dL (ref 6.5–8.1)

## 2017-10-19 LAB — CBC WITH DIFFERENTIAL/PLATELET
Basophils Absolute: 0 10*3/uL (ref 0–0.1)
Basophils Relative: 1 %
Eosinophils Absolute: 0.3 10*3/uL (ref 0–0.7)
Eosinophils Relative: 5 %
HCT: 38.3 % (ref 35.0–47.0)
Hemoglobin: 12.6 g/dL (ref 12.0–16.0)
Lymphocytes Relative: 24 %
Lymphs Abs: 1.4 10*3/uL (ref 1.0–3.6)
MCH: 28.3 pg (ref 26.0–34.0)
MCHC: 32.9 g/dL (ref 32.0–36.0)
MCV: 85.9 fL (ref 80.0–100.0)
Monocytes Absolute: 0.6 10*3/uL (ref 0.2–0.9)
Monocytes Relative: 10 %
Neutro Abs: 3.6 10*3/uL (ref 1.4–6.5)
Neutrophils Relative %: 60 %
Platelets: 329 10*3/uL (ref 150–440)
RBC: 4.46 MIL/uL (ref 3.80–5.20)
RDW: 14.3 % (ref 11.5–14.5)
WBC: 5.9 10*3/uL (ref 3.6–11.0)

## 2017-10-19 NOTE — Progress Notes (Signed)
Here for follow up. Pt lives at [facility] . Here w staff member -Wandra Arthurs.   Per pt " feeling good "

## 2017-10-19 NOTE — Progress Notes (Signed)
[facility] day:  10/19/2017  Chief Complaint: Cheryl [name] is a 77 y.o. 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  HPI:  The patient was last seen in the medical oncology clinic on 10/13/2016.  At that time, patient was doing well. She denies acute complains. No shortness of breath. Appetite stable; weight up 2 pounds. Exam revealed a lipomatous growth to her RIGHT scapular areas. No longer smoking. Labs unremarkable.  She was seen in follow up consult on 04/30/2017 by Dr. Merton Border (pulmonary medicine). Notes reviewed. Patient doing well. Seen for remote history of lung cancer and COPD. COPD stable with no recent AECOPD or hospitalizations. Continued on ICS/LABA (Advair) MDI. Scheduled for follow up on a PRN basis  Chest CT on 10/13/2017 was stable, revealing LEFT perihilar scarring. No new findings or evidence to suggest recurrent malignancy.   In the interim, patient has been doing well. She notes some intermittent periods of shortness of breath related to her COPD diagnosis. Her cough has improved. No hemoptysis. She denies chest pain. Patient denies that she has experienced any B symptoms. She denies any interval infections.   Patient advises that she maintains an adequate appetite. She is eating well. Weight today is 193 lb 11.2 oz (87.9 kg), which compared to her last visit to the clinic, represents a 4 pound increase.   Patient denies pain in the clinic today.   Past Medical History:  Diagnosis Date  . COPD (chronic obstructive pulmonary disease) ([place])   . Dementia ([place])   . Diabetes mellitus without complication ([place])   . GERD (gastroesophageal reflux disease)   . Hyperlipidemia   . Hypertension   . Lung cancer ([place])   . Mental retardation   . Schizophrenia [facility])     Past Surgical History:  Procedure Laterality Date  . ABDOMINAL HYSTERECTOMY      Family History  Problem  Relation Age of Onset  . Breast cancer Neg Hx     Social History:  reports that she quit smoking about 13 months ago. Her smoking use included cigarettes. She smoked 0.25 packs per day. She has never used smokeless tobacco. She reports that she does not drink alcohol or use drugs.  She was smoking 6-8 cigarettes a day (1 every 2 hours).  She no longer smokes.  She resides in a group home [facility]).  She is able to care for 80% of her ADLs.  The patient is accompanied by Albertha Ghee, from the [facility] group home, today.  Allergies: No Known Allergies  Current Medications: Current Outpatient Medications  Medication Sig Dispense Refill  . ALPRAZolam (XANAX) 0.25 MG tablet Take 0.25 mg by mouth 2 (two) times daily.     Marland Kitchen amLODipine (NORVASC) 10 MG tablet Take 10 mg by mouth daily.     Marland Kitchen aspirin 325 MG tablet Take 325 mg by mouth daily.     Marland Kitchen atorvastatin (LIPITOR) 20 MG tablet Take 20 mg by mouth daily.    . ferrous sulfate 325 (65 FE) MG tablet Take by mouth 2 (two) times daily with a meal.     . fluticasone (FLONASE) 50 MCG/ACT nasal spray Place 2 sprays into both nostrils daily. 16 g 10  . Fluticasone-Salmeterol (ADVAIR DISKUS) 250-50 MCG/DOSE AEPB Inhale 1 puff into the lungs 2 (two) times daily.    . magnesium hydroxide (MILK OF MAGNESIA) 400 MG/5ML suspension Take 40 mLs by mouth daily.     Marland Kitchen  Memantine HCl-Donepezil HCl (NAMZARIC) 28-10 MG CP24 Take 1 capsule by mouth daily.    . metFORMIN (GLUCOPHAGE) 500 MG tablet Take 500 mg by mouth 2 (two) times daily with a meal.     . modafinil (PROVIGIL) 100 MG tablet Take 100 mg by mouth daily.     . Multiple Vitamin (MULTI-VITAMINS) TABS Take 1 tablet by mouth daily.     Marland Kitchen omeprazole (PRILOSEC) 40 MG capsule Take 40 mg by mouth daily.     Marland Kitchen perphenazine (TRILAFON) 4 MG tablet Take 4 mg by mouth 3 (three) times daily.    . QUEtiapine (SEROQUEL XR) 300 MG 24 hr tablet Take 300 mg by mouth at bedtime.    . Vitamin D, Ergocalciferol, (DRISDOL) 50000 UNITS  CAPS capsule Take 50,000 Units by mouth every 30 (thirty) days.     Marland Kitchen acetaminophen (TYLENOL) 325 MG tablet Take 325 mg by mouth daily as needed.     Marland Kitchen albuterol (PROVENTIL HFA) 108 (90 Base) MCG/ACT inhaler Inhale 2 puffs into the lungs every 4 (four) hours as needed for wheezing or shortness of breath. (Patient not taking: Reported on 10/19/2017) 1 Inhaler 0  . aluminum-magnesium hydroxide-simethicone (MAALOX) 200-200-20 MG/5ML SUSP Take 5 mLs by mouth every 6 (six) hours as needed.    Marland Kitchen lisinopril (PRINIVIL,ZESTRIL) 10 MG tablet Take 10 mg by mouth daily.      No current facility-administered medications for this visit.     Review of Systems  Constitutional: Negative for diaphoresis, fever, malaise/fatigue and weight loss (up 4 pounds).       Feels "good".  HENT: Negative.   Eyes: Negative.   Respiratory: Positive for cough (chronic; improved) and shortness of breath (intermittent). Negative for hemoptysis and sputum production.        COPD. Former smoker.  Cardiovascular: Negative for chest pain, palpitations, orthopnea, leg swelling and PND.  Gastrointestinal: Negative for abdominal pain, blood in stool, constipation, diarrhea, melena, nausea and vomiting.  Genitourinary: Negative for dysuria, frequency, hematuria and urgency.       Urinary incontinence  Musculoskeletal: Negative for back pain, falls, joint pain and myalgias.  Skin: Negative for itching and rash.  Neurological: Negative for dizziness, tremors, weakness and headaches.  Endo/Heme/Allergies: Does not bruise/bleed easily.       PMH (+) for diabetes  Psychiatric/Behavioral: Negative for depression, memory loss and suicidal ideas. The patient is not nervous/anxious and does not have insomnia.   All other systems reviewed and are negative.  Performance status (ECOG): 3 - Symptomatic, >50% confined to bed  Vital Signs BP 137/80 (BP Location: Right Arm, Patient Position: Sitting)   Pulse 94   Temp 97.8 F (36.6 C)  (Tympanic)   Resp 18   Wt 193 lb 11.2 oz (87.9 kg)   BMI 33.25 kg/m   Physical Exam  Constitutional: She is oriented to person, place, and time and well-developed, well-nourished, and in no distress.  HENT:  Head: Normocephalic and atraumatic.  Gray hair.  Eyes: Pupils are equal, round, and reactive to light. EOM are normal. No scleral icterus.  Glasses. Brown eyes.   Neck: Normal range of motion. Neck supple. No tracheal deviation present. No thyromegaly present.  Cardiovascular: Normal rate, regular rhythm and normal heart sounds. Exam reveals no gallop and no friction rub.  No murmur heard. Pulmonary/Chest: Effort normal and breath sounds normal. No respiratory distress. She has no wheezes. She has no rales.  Abdominal: Soft. Bowel sounds are normal. She exhibits no distension. There is no  tenderness.  Musculoskeletal: Normal range of motion. She exhibits no edema or tenderness.  Lymphadenopathy:    She has no cervical adenopathy.    She has no axillary adenopathy.       Right: No inguinal and no supraclavicular adenopathy present.       Left: No inguinal and no supraclavicular adenopathy present.  Neurological: She is alert and oriented to person, place, and time.  Skin: Skin is warm and dry. Lesion (RIGHT scapula lipoma) noted. No rash noted. No erythema.  Psychiatric: Mood, affect and judgment normal.  Nursing note and vitals reviewed.   Appointment on 10/19/2017  Component Date Value Ref Range Status  . Sodium 10/19/2017 141  135 - 145 mmol/L Final  . Potassium 10/19/2017 3.9  3.5 - 5.1 mmol/L Final  . Chloride 10/19/2017 108  98 - 111 mmol/L Final  . CO2 10/19/2017 25  22 - 32 mmol/L Final  . Glucose, Bld 10/19/2017 146* 70 - 99 mg/dL Final  . BUN 10/19/2017 22  8 - 23 mg/dL Final  . Creatinine, Ser 10/19/2017 1.14* 0.44 - 1.00 mg/dL Final  . Calcium 10/19/2017 9.3  8.9 - 10.3 mg/dL Final  . Total Protein 10/19/2017 7.4  6.5 - 8.1 g/dL Final  . Albumin 10/19/2017 4.1   3.5 - 5.0 g/dL Final  . AST 10/19/2017 23  15 - 41 U/L Final  . ALT 10/19/2017 20  0 - 44 U/L Final  . Alkaline Phosphatase 10/19/2017 77  38 - 126 U/L Final  . Total Bilirubin 10/19/2017 0.4  0.3 - 1.2 mg/dL Final  . GFR calc non Af Amer 10/19/2017 46* >60 mL/min Final  . GFR calc Af Amer 10/19/2017 53* >60 mL/min Final   Comment: (NOTE) The eGFR has been calculated using the CKD EPI equation. This calculation has not been validated in all clinical situations. eGFR's persistently <60 mL/min signify possible Chronic Kidney Disease.   Georgiann Hahn gap 10/19/2017 8  5 - 15 Final   Performed at [facility], [address] [place], [place] [address]  . WBC 10/19/2017 5.9  3.6 - 11.0 K/uL Final  . RBC 10/19/2017 4.46  3.80 - 5.20 MIL/uL Final  . Hemoglobin 10/19/2017 12.6  12.0 - 16.0 g/dL Final  . HCT 10/19/2017 38.3  35.0 - 47.0 % Final  . MCV 10/19/2017 85.9  80.0 - 100.0 fL Final  . MCH 10/19/2017 28.3  26.0 - 34.0 pg Final  . MCHC 10/19/2017 32.9  32.0 - 36.0 g/dL Final  . RDW 10/19/2017 14.3  11.5 - 14.5 % Final  . Platelets 10/19/2017 329  150 - 440 K/uL Final  . Neutrophils Relative % 10/19/2017 60  % Final  . Neutro Abs 10/19/2017 3.6  1.4 - 6.5 K/uL Final  . Lymphocytes Relative 10/19/2017 24  % Final  . Lymphs Abs 10/19/2017 1.4  1.0 - 3.6 K/uL Final  . Monocytes Relative 10/19/2017 10  % Final  . Monocytes Absolute 10/19/2017 0.6  0.2 - 0.9 K/uL Final  . Eosinophils Relative 10/19/2017 5  % Final  . Eosinophils Absolute 10/19/2017 0.3  0 - 0.7 K/uL Final  . Basophils Relative 10/19/2017 1  % Final  . Basophils Absolute 10/19/2017 0.0  0 - 0.1 K/uL Final   Performed at [facility], [address] [place], [place] [address]    Assessment:  Navya Timmons is a 77 y.o. 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  Chest CT on 07/10/2011  revealed a 4.7 x 3.4 cm pleural-based mass in the left lower lobe.  CT-guided fine-needle  aspiration on 07/25/2011 revealed non-small cell carcinoma, favoring adenocarcinoma.    PET scan on 07/11/2011 revealed hypermetabolic subpleural mass in the left lower lobe and mildly hypermetabolic left-sided mediastinal lymph nodes.  There was no evidence of FDG avid metastatic disease in the abdomen or pelvis.  Clinical stage was T2N2M0 (stage IIIA).   She received 4,000 cGy from 08/13/2011 - 09/09/2011 in a large field and 3,000 cGy in a small field boost from 10/13/2011 - 11/03/2011.  She did not receive chemotherapy secondary to her co-morbidities and poor performance status.  PET scan on 08/12/2012 revealed resolution of the PET positive mass.  Chest CT on 09/05/2015 revealed stable postradiation changes in the left lung. There was no evidence of recurrent or metastatic disease in the chest.  There were stable hepatic cysts.  Chest CT angiogram on 01/05/2016 revealed no evidence of pulmonary embolism.  Respiratory motion artifacts limited the exam.  There was scattered patchy infiltrates in RIGHT lung with areas of scarring in LEFT upper lobe question related to prior treatment for lung cancer.  Chest CT on 10/08/2016 revealed stable post treatment changes. There were no findings of residual or recurrence of tumor.  There was a 9 mm paratracheal lymph nodes, stable.  Chest CT on 10/13/2017 was stable, revealing LEFT perihilar scarring. No new findings or evidence to suggest recurrent malignancy.   She has mental retardation.  She lives in a group home.  Symptomatically, patient is doing well overall.  She denies any acute complaints today patient with minimal intermittent episodes of shortness of breath.  She denies chest pain.  She has a chronic cough related to her underlying COPD.  Exam is stable.  Patient no longer smoking. Creatinine 1.14.   Plan: 1. Labs today: CBC with diff, CMP, CEA. 2. Stage IIIA adenocarcinoma of the LEFT lower lobe  Doing well. No increased shortness of breath.  Chronic cough related to COPD.  CEA 4.9 (stable from previous).  Review chest CT Imaging personally reviewed and felt to be consistent with the dictated radiology report. Imaging reviewed with patient.   LEFT perihilar scarring.   No evidence of recurrent malignancy.   Refer to low dose chest CT screening program.  3. COPD  Stable. Continues on ICS/LABA (Advair).  Followed by pulmonary medicine Alva Garnet, MD).  4. RTC in 1 year for MD assessment and labs (CBC with diff, CMP, CEA).   Honor Loh, NP  10/19/2017, 2:29 PM   I saw and evaluated the patient, participating in the key portions of the service and reviewing pertinent diagnostic studies and records.  I reviewed the nurse practitioner's note and agree with the findings and the plan.  The assessment and plan were discussed with the patient.  A few questions were asked by the caregiver and answered.   Nolon Stalls, MD 10/19/17, 2:29 PM

## 2017-10-20 LAB — CEA: CEA: 4.9 ng/mL — ABNORMAL HIGH (ref 0.0–4.7)

## 2018-10-21 NOTE — Progress Notes (Deleted)
Called patient on mobile, this is a wrong number, called home and patient not available

## 2018-10-22 ENCOUNTER — Ambulatory Visit: Payer: Medicare Other | Admitting: Hematology and Oncology

## 2018-10-22 ENCOUNTER — Inpatient Hospital Stay: Payer: Medicare Other | Admitting: Hematology and Oncology

## 2018-10-22 ENCOUNTER — Inpatient Hospital Stay: Payer: Medicare Other | Attending: Hematology and Oncology

## 2018-10-22 ENCOUNTER — Other Ambulatory Visit: Payer: Medicare Other

## 2019-05-09 ENCOUNTER — Emergency Department: Payer: Medicare Other

## 2019-05-09 ENCOUNTER — Other Ambulatory Visit: Payer: Self-pay

## 2019-05-09 ENCOUNTER — Inpatient Hospital Stay
Admission: EM | Admit: 2019-05-09 | Discharge: 2019-05-16 | DRG: 190 | Disposition: A | Discharge status: Skilled Nursing Facility | Payer: Medicare Other | Source: Skilled Nursing Facility | Attending: Internal Medicine | Admitting: Internal Medicine

## 2019-05-09 DIAGNOSIS — R000 Tachycardia, unspecified: Secondary | ICD-10-CM | POA: Diagnosis present

## 2019-05-09 DIAGNOSIS — Z87891 Personal history of nicotine dependence: Secondary | ICD-10-CM | POA: Diagnosis not present

## 2019-05-09 DIAGNOSIS — Z90710 Acquired absence of both cervix and uterus: Secondary | ICD-10-CM

## 2019-05-09 DIAGNOSIS — Y92239 Unspecified place in hospital as the place of occurrence of the external cause: Secondary | ICD-10-CM | POA: Diagnosis not present

## 2019-05-09 DIAGNOSIS — Z7982 Long term (current) use of aspirin: Secondary | ICD-10-CM

## 2019-05-09 DIAGNOSIS — I10 Essential (primary) hypertension: Secondary | ICD-10-CM

## 2019-05-09 DIAGNOSIS — F03B18 Unspecified dementia, moderate, with other behavioral disturbance: Secondary | ICD-10-CM | POA: Diagnosis present

## 2019-05-09 DIAGNOSIS — J9601 Acute respiratory failure with hypoxia: Secondary | ICD-10-CM | POA: Diagnosis not present

## 2019-05-09 DIAGNOSIS — J9602 Acute respiratory failure with hypercapnia: Secondary | ICD-10-CM

## 2019-05-09 DIAGNOSIS — F79 Unspecified intellectual disabilities: Secondary | ICD-10-CM | POA: Diagnosis present

## 2019-05-09 DIAGNOSIS — K219 Gastro-esophageal reflux disease without esophagitis: Secondary | ICD-10-CM | POA: Diagnosis present

## 2019-05-09 DIAGNOSIS — Z7984 Long term (current) use of oral hypoglycemic drugs: Secondary | ICD-10-CM

## 2019-05-09 DIAGNOSIS — I4891 Unspecified atrial fibrillation: Secondary | ICD-10-CM | POA: Diagnosis not present

## 2019-05-09 DIAGNOSIS — I471 Supraventricular tachycardia: Secondary | ICD-10-CM | POA: Diagnosis present

## 2019-05-09 DIAGNOSIS — T380X5A Adverse effect of glucocorticoids and synthetic analogues, initial encounter: Secondary | ICD-10-CM | POA: Diagnosis not present

## 2019-05-09 DIAGNOSIS — I4510 Unspecified right bundle-branch block: Secondary | ICD-10-CM | POA: Diagnosis present

## 2019-05-09 DIAGNOSIS — F0391 Unspecified dementia with behavioral disturbance: Secondary | ICD-10-CM | POA: Diagnosis present

## 2019-05-09 DIAGNOSIS — R0602 Shortness of breath: Secondary | ICD-10-CM | POA: Diagnosis present

## 2019-05-09 DIAGNOSIS — Z85118 Personal history of other malignant neoplasm of bronchus and lung: Secondary | ICD-10-CM

## 2019-05-09 DIAGNOSIS — J9622 Acute and chronic respiratory failure with hypercapnia: Secondary | ICD-10-CM | POA: Diagnosis present

## 2019-05-09 DIAGNOSIS — C3490 Malignant neoplasm of unspecified part of unspecified bronchus or lung: Secondary | ICD-10-CM

## 2019-05-09 DIAGNOSIS — Z20822 Contact with and (suspected) exposure to covid-19: Secondary | ICD-10-CM | POA: Diagnosis present

## 2019-05-09 DIAGNOSIS — I4892 Unspecified atrial flutter: Secondary | ICD-10-CM | POA: Diagnosis present

## 2019-05-09 DIAGNOSIS — E11649 Type 2 diabetes mellitus with hypoglycemia without coma: Secondary | ICD-10-CM | POA: Diagnosis not present

## 2019-05-09 DIAGNOSIS — Z79899 Other long term (current) drug therapy: Secondary | ICD-10-CM

## 2019-05-09 DIAGNOSIS — E785 Hyperlipidemia, unspecified: Secondary | ICD-10-CM | POA: Diagnosis present

## 2019-05-09 DIAGNOSIS — J441 Chronic obstructive pulmonary disease with (acute) exacerbation: Principal | ICD-10-CM | POA: Diagnosis present

## 2019-05-09 DIAGNOSIS — J9691 Respiratory failure, unspecified with hypoxia: Secondary | ICD-10-CM

## 2019-05-09 DIAGNOSIS — F209 Schizophrenia, unspecified: Secondary | ICD-10-CM | POA: Diagnosis present

## 2019-05-09 DIAGNOSIS — E1165 Type 2 diabetes mellitus with hyperglycemia: Secondary | ICD-10-CM | POA: Diagnosis present

## 2019-05-09 DIAGNOSIS — J9621 Acute and chronic respiratory failure with hypoxia: Secondary | ICD-10-CM | POA: Diagnosis present

## 2019-05-09 LAB — CBC WITH DIFFERENTIAL/PLATELET
Abs Immature Granulocytes: 0.06 10*3/uL (ref 0.00–0.07)
Basophils Absolute: 0 10*3/uL (ref 0.0–0.1)
Basophils Relative: 0 %
Eosinophils Absolute: 0.2 10*3/uL (ref 0.0–0.5)
Eosinophils Relative: 1 %
HCT: 34.6 % — ABNORMAL LOW (ref 36.0–46.0)
Hemoglobin: 10.8 g/dL — ABNORMAL LOW (ref 12.0–15.0)
Immature Granulocytes: 0 %
Lymphocytes Relative: 12 %
Lymphs Abs: 1.8 10*3/uL (ref 0.7–4.0)
MCH: 26.8 pg (ref 26.0–34.0)
MCHC: 31.2 g/dL (ref 30.0–36.0)
MCV: 85.9 fL (ref 80.0–100.0)
Monocytes Absolute: 1.2 10*3/uL — ABNORMAL HIGH (ref 0.1–1.0)
Monocytes Relative: 9 %
Neutro Abs: 10.8 10*3/uL — ABNORMAL HIGH (ref 1.7–7.7)
Neutrophils Relative %: 78 %
Platelets: 282 10*3/uL (ref 150–400)
RBC: 4.03 MIL/uL (ref 3.87–5.11)
RDW: 14.1 % (ref 11.5–15.5)
WBC: 14.1 10*3/uL — ABNORMAL HIGH (ref 4.0–10.5)
nRBC: 0 % (ref 0.0–0.2)

## 2019-05-09 LAB — COMPREHENSIVE METABOLIC PANEL
ALT: 16 U/L (ref 0–44)
AST: 22 U/L (ref 15–41)
Albumin: 3.7 g/dL (ref 3.5–5.0)
Alkaline Phosphatase: 82 U/L (ref 38–126)
Anion gap: 8 (ref 5–15)
BUN: 19 mg/dL (ref 8–23)
CO2: 27 mmol/L (ref 22–32)
Calcium: 9 mg/dL (ref 8.9–10.3)
Chloride: 102 mmol/L (ref 98–111)
Creatinine, Ser: 1.35 mg/dL — ABNORMAL HIGH (ref 0.44–1.00)
GFR calc Af Amer: 44 mL/min — ABNORMAL LOW (ref >60)
GFR calc non Af Amer: 38 mL/min — ABNORMAL LOW (ref >60)
Glucose, Bld: 191 mg/dL — ABNORMAL HIGH (ref 70–99)
Potassium: 3.8 mmol/L (ref 3.5–5.1)
Sodium: 137 mmol/L (ref 135–145)
Total Bilirubin: 0.6 mg/dL (ref 0.3–1.2)
Total Protein: 8.1 g/dL (ref 6.5–8.1)

## 2019-05-09 LAB — BLOOD GAS, VENOUS
Acid-base deficit: 0.8 mmol/L (ref 0.0–2.0)
Bicarbonate: 26.2 mmol/L (ref 20.0–28.0)
O2 Saturation: 89 %
Patient temperature: 37
pCO2, Ven: 52 mmHg (ref 44.0–60.0)
pH, Ven: 7.31 (ref 7.250–7.430)
pO2, Ven: 62 mmHg — ABNORMAL HIGH (ref 32.0–45.0)

## 2019-05-09 LAB — GLUCOSE, CAPILLARY
Glucose-Capillary: 178 mg/dL — ABNORMAL HIGH (ref 70–99)
Glucose-Capillary: 196 mg/dL — ABNORMAL HIGH (ref 70–99)

## 2019-05-09 LAB — TROPONIN I (HIGH SENSITIVITY)
Troponin I (High Sensitivity): 40 ng/L — ABNORMAL HIGH (ref <18)
Troponin I (High Sensitivity): 40 ng/L — ABNORMAL HIGH (ref <18)

## 2019-05-09 LAB — RESPIRATORY PANEL BY RT PCR (FLU A&B, COVID)
Influenza A by PCR: NEGATIVE
Influenza B by PCR: NEGATIVE
SARS Coronavirus 2 by RT PCR: NEGATIVE

## 2019-05-09 LAB — CREATININE, SERUM
Creatinine, Ser: 1.19 mg/dL — ABNORMAL HIGH (ref 0.44–1.00)
GFR calc Af Amer: 51 mL/min — ABNORMAL LOW
GFR calc non Af Amer: 44 mL/min — ABNORMAL LOW

## 2019-05-09 LAB — CBC
HCT: 33.7 % — ABNORMAL LOW (ref 36.0–46.0)
Hemoglobin: 10.7 g/dL — ABNORMAL LOW (ref 12.0–15.0)
MCH: 26.9 pg (ref 26.0–34.0)
MCHC: 31.8 g/dL (ref 30.0–36.0)
MCV: 84.7 fL (ref 80.0–100.0)
Platelets: 279 K/uL (ref 150–400)
RBC: 3.98 MIL/uL (ref 3.87–5.11)
RDW: 13.9 % (ref 11.5–15.5)
WBC: 12.1 K/uL — ABNORMAL HIGH (ref 4.0–10.5)
nRBC: 0 % (ref 0.0–0.2)

## 2019-05-09 LAB — BRAIN NATRIURETIC PEPTIDE: B Natriuretic Peptide: 224 pg/mL — ABNORMAL HIGH (ref 0.0–100.0)

## 2019-05-09 LAB — MRSA PCR SCREENING: MRSA by PCR: NEGATIVE

## 2019-05-09 MED ORDER — METHYLPREDNISOLONE SODIUM SUCC 125 MG IJ SOLR
60.0000 mg | Freq: Once | INTRAMUSCULAR | Status: AC
  Administered 2019-05-09: 60 mg via INTRAVENOUS
  Filled 2019-05-09: qty 2

## 2019-05-09 MED ORDER — IPRATROPIUM-ALBUTEROL 0.5-2.5 (3) MG/3ML IN SOLN
3.0000 mL | Freq: Once | RESPIRATORY_TRACT | Status: AC
  Administered 2019-05-09: 20:00:00 3 mL via RESPIRATORY_TRACT
  Filled 2019-05-09: qty 3

## 2019-05-09 MED ORDER — MAGNESIUM SULFATE 2 GM/50ML IV SOLN
2.0000 g | Freq: Once | INTRAVENOUS | Status: AC
  Administered 2019-05-09: 20:00:00 2 g via INTRAVENOUS
  Filled 2019-05-09: qty 50

## 2019-05-09 MED ORDER — SODIUM CHLORIDE 0.9% FLUSH: 3.0000 mL | Freq: Two times a day (BID) | INTRAVENOUS | Status: DC

## 2019-05-09 MED ORDER — INSULIN ASPART 100 UNIT/ML ~~LOC~~ SOLN
0.0000 [IU] | Freq: Three times a day (TID) | SUBCUTANEOUS | Status: DC
  Administered 2019-05-10: 2 [IU] via SUBCUTANEOUS
  Filled 2019-05-09: qty 1

## 2019-05-09 MED ORDER — DONEPEZIL HCL 5 MG PO TABS
10.0000 mg | ORAL_TABLET | Freq: Every day | ORAL | Status: DC
  Administered 2019-05-11 – 2019-05-15 (×5): 10 mg via ORAL
  Filled 2019-05-09 (×8): qty 2

## 2019-05-09 MED ORDER — FERROUS SULFATE 325 (65 FE) MG PO TABS
325.0000 mg | ORAL_TABLET | Freq: Two times a day (BID) | ORAL | Status: DC
  Administered 2019-05-12 – 2019-05-16 (×9): 325 mg via ORAL
  Filled 2019-05-09 (×9): qty 1

## 2019-05-09 MED ORDER — BISMUTH SUBSALICYLATE 262 MG/15ML PO SUSP: 15.0000 mL | ORAL | Status: DC | PRN

## 2019-05-09 MED ORDER — METHYLPREDNISOLONE SODIUM SUCC 125 MG IJ SOLR
60.0000 mg | Freq: Once | INTRAMUSCULAR | Status: AC
  Administered 2019-05-09: 17:00:00 60 mg via INTRAVENOUS
  Filled 2019-05-09: qty 2

## 2019-05-09 MED ORDER — MEMANTINE HCL ER 28 MG PO CP24
28.0000 mg | ORAL_CAPSULE | Freq: Every day | ORAL | Status: DC
  Administered 2019-05-11 – 2019-05-15 (×5): 28 mg via ORAL
  Filled 2019-05-09 (×7): qty 1

## 2019-05-09 MED ORDER — AMLODIPINE BESYLATE 10 MG PO TABS
10.0000 mg | ORAL_TABLET | Freq: Every day | ORAL | Status: DC
  Administered 2019-05-11 – 2019-05-16 (×6): 10 mg via ORAL
  Filled 2019-05-09: qty 1
  Filled 2019-05-09: qty 2
  Filled 2019-05-09 (×3): qty 1
  Filled 2019-05-09: qty 2

## 2019-05-09 MED ORDER — PREDNISONE 20 MG PO TABS: 40.0000 mg | ORAL_TABLET | Freq: Every day | ORAL | Status: DC

## 2019-05-09 MED ORDER — LISINOPRIL 10 MG PO TABS
10.0000 mg | ORAL_TABLET | Freq: Every day | ORAL | Status: DC
  Administered 2019-05-11 – 2019-05-16 (×6): 10 mg via ORAL
  Filled 2019-05-09 (×4): qty 1
  Filled 2019-05-09: qty 2
  Filled 2019-05-09: qty 1

## 2019-05-09 MED ORDER — CHLORHEXIDINE GLUCONATE CLOTH 2 % EX PADS
6.0000 | MEDICATED_PAD | Freq: Every day | CUTANEOUS | Status: DC
  Administered 2019-05-09 – 2019-05-14 (×3): 6 via TOPICAL

## 2019-05-09 MED ORDER — ADULT MULTIVITAMIN W/MINERALS CH
1.0000 | ORAL_TABLET | Freq: Every day | ORAL | Status: DC
  Administered 2019-05-12 – 2019-05-16 (×5): 1 via ORAL
  Filled 2019-05-09 (×5): qty 1

## 2019-05-09 MED ORDER — QUETIAPINE FUMARATE 100 MG PO TABS
300.0000 mg | ORAL_TABLET | Freq: Every day | ORAL | Status: DC
  Administered 2019-05-11 – 2019-05-15 (×5): 300 mg via ORAL
  Filled 2019-05-09 (×2): qty 3
  Filled 2019-05-09: qty 12
  Filled 2019-05-09: qty 3
  Filled 2019-05-09: qty 12
  Filled 2019-05-09: qty 3
  Filled 2019-05-09 (×2): qty 12
  Filled 2019-05-09 (×2): qty 3

## 2019-05-09 MED ORDER — SODIUM CHLORIDE 0.9% FLUSH
3.0000 mL | Freq: Two times a day (BID) | INTRAVENOUS | Status: DC
  Administered 2019-05-09: 23:00:00 3 mL via INTRAVENOUS

## 2019-05-09 MED ORDER — PERPHENAZINE 4 MG PO TABS
4.0000 mg | ORAL_TABLET | Freq: Three times a day (TID) | ORAL | Status: DC
  Administered 2019-05-11 – 2019-05-16 (×15): 4 mg via ORAL
  Filled 2019-05-09 (×22): qty 1

## 2019-05-09 MED ORDER — ALPRAZOLAM 0.5 MG PO TABS: 0.2500 mg | ORAL_TABLET | Freq: Two times a day (BID) | ORAL | Status: DC

## 2019-05-09 MED ORDER — ASPIRIN 325 MG PO TABS
325.0000 mg | ORAL_TABLET | Freq: Every day | ORAL | Status: DC
  Administered 2019-05-11 – 2019-05-16 (×6): 325 mg via ORAL
  Filled 2019-05-09 (×6): qty 1

## 2019-05-09 MED ORDER — HEPARIN SODIUM (PORCINE) 5000 UNIT/ML IJ SOLN
5000.0000 [IU] | Freq: Three times a day (TID) | INTRAMUSCULAR | Status: DC
  Administered 2019-05-09 – 2019-05-16 (×20): 5000 [IU] via SUBCUTANEOUS
  Filled 2019-05-09 (×19): qty 1

## 2019-05-09 MED ORDER — VITAMIN D (ERGOCALCIFEROL) 1.25 MG (50000 UNIT) PO CAPS
50000.0000 [IU] | ORAL_CAPSULE | ORAL | Status: DC
  Filled 2019-05-09: qty 1

## 2019-05-09 MED ORDER — MODAFINIL 100 MG PO TABS
100.0000 mg | ORAL_TABLET | Freq: Every day | ORAL | Status: DC
  Administered 2019-05-12 – 2019-05-16 (×5): 100 mg via ORAL
  Filled 2019-05-09 (×5): qty 1

## 2019-05-09 MED ORDER — SODIUM CHLORIDE 0.9% FLUSH: 3.0000 mL | INTRAVENOUS | Status: DC | PRN

## 2019-05-09 MED ORDER — IOHEXOL 350 MG/ML SOLN
60.0000 mL | Freq: Once | INTRAVENOUS | Status: AC | PRN
  Administered 2019-05-09: 18:00:00 60 mL via INTRAVENOUS

## 2019-05-09 MED ORDER — MOMETASONE FURO-FORMOTEROL FUM 200-5 MCG/ACT IN AERO
2.0000 | INHALATION_SPRAY | Freq: Two times a day (BID) | RESPIRATORY_TRACT | Status: DC
  Administered 2019-05-10 – 2019-05-16 (×10): 2 via RESPIRATORY_TRACT
  Filled 2019-05-09 (×2): qty 8.8

## 2019-05-09 MED ORDER — MEMANTINE HCL-DONEPEZIL HCL 28-10 MG PO CP24
1.0000 | ORAL_CAPSULE | Freq: Every day | ORAL | Status: DC
Start: 2019-05-09 — End: 2019-05-09

## 2019-05-09 MED ORDER — INSULIN ASPART 100 UNIT/ML ~~LOC~~ SOLN: 0.0000 [IU] | Freq: Every day | SUBCUTANEOUS | Status: DC

## 2019-05-09 MED ORDER — SODIUM CHLORIDE 0.9 % IV SOLN: 250.0000 mL | INTRAVENOUS | Status: DC | PRN

## 2019-05-09 MED ORDER — INSULIN ASPART 100 UNIT/ML ~~LOC~~ SOLN
5.0000 [IU] | Freq: Once | SUBCUTANEOUS | Status: AC
  Administered 2019-05-09: 5 [IU] via INTRAVENOUS
  Filled 2019-05-09: qty 1

## 2019-05-09 MED ORDER — ALUM & MAG HYDROXIDE-SIMETH 200-200-20 MG/5ML PO SUSP: 30.0000 mL | Freq: Every day | ORAL | Status: DC | PRN

## 2019-05-09 MED ORDER — SODIUM CHLORIDE 0.9 % IV SOLN
1.0000 g | INTRAVENOUS | Status: DC
  Administered 2019-05-10: 1 g via INTRAVENOUS
  Filled 2019-05-09 (×2): qty 10

## 2019-05-09 MED ORDER — ATORVASTATIN CALCIUM 20 MG PO TABS
20.0000 mg | ORAL_TABLET | Freq: Every day | ORAL | Status: DC
  Administered 2019-05-11 – 2019-05-15 (×5): 20 mg via ORAL
  Filled 2019-05-09 (×5): qty 1

## 2019-05-09 MED ORDER — IPRATROPIUM-ALBUTEROL 0.5-2.5 (3) MG/3ML IN SOLN
3.0000 mL | Freq: Once | RESPIRATORY_TRACT | Status: AC
  Administered 2019-05-09: 3 mL via RESPIRATORY_TRACT
  Filled 2019-05-09: qty 3

## 2019-05-09 MED ORDER — FUROSEMIDE 10 MG/ML IJ SOLN: 60.0000 mg | Freq: Once | INTRAMUSCULAR | Status: DC

## 2019-05-09 MED ORDER — PANTOPRAZOLE SODIUM 40 MG PO TBEC
40.0000 mg | DELAYED_RELEASE_TABLET | Freq: Every day | ORAL | Status: DC
  Administered 2019-05-11 – 2019-05-16 (×6): 40 mg via ORAL
  Filled 2019-05-09 (×6): qty 1

## 2019-05-09 MED ORDER — FLUTICASONE PROPIONATE 50 MCG/ACT NA SUSP
2.0000 | Freq: Every day | NASAL | Status: DC
  Administered 2019-05-14 – 2019-05-16 (×3): 2 via NASAL
  Filled 2019-05-09 (×2): qty 16

## 2019-05-09 MED ORDER — LOPERAMIDE HCL 2 MG PO CAPS: 2.0000 mg | ORAL_CAPSULE | ORAL | Status: DC | PRN

## 2019-05-09 MED ORDER — MAGNESIUM HYDROXIDE 400 MG/5ML PO SUSP: 30.0000 mL | Freq: Every day | ORAL | Status: DC | PRN

## 2019-05-09 MED ORDER — ALBUTEROL SULFATE (2.5 MG/3ML) 0.083% IN NEBU: 2.5000 mg | INHALATION_SOLUTION | RESPIRATORY_TRACT | Status: DC | PRN

## 2019-05-09 NOTE — ED Provider Notes (Signed)
[facility] Emergency Department Provider Note    First MD Initiated Contact with Patient 05/09/19 1611     (approximate)  I have reviewed the triage vital signs and the nursing notes.   HISTORY  Chief Complaint Shortness of Breath    HPI Cheryl [name] is a 79 y.o. female [name] with the below listed past medical history presents to the ER for evaluation of shortness of breath.  Patient was given nebulizers around to EMS appreciating significant wheezing.  She did have some wheezing on exam is also complaining of some pleuritic chest pain upon arrival.  Also reported has a history of CHF.  Does not wear home oxygen.  Denies any nausea or vomiting.  No recent fevers.    Past Medical History:  Diagnosis Date  . COPD (chronic obstructive pulmonary disease) ([place])   . Dementia ([place])   . Diabetes mellitus without complication ([place])   . GERD (gastroesophageal reflux disease)   . Hyperlipidemia   . Hypertension   . Lung cancer ([place])   . Mental retardation   . Schizophrenia ([place])    Family History  Problem Relation Age of Onset  . Breast cancer Neg Hx    Past Surgical History:  Procedure Laterality Date  . ABDOMINAL HYSTERECTOMY     Patient Active Problem List   Diagnosis Date Noted  . 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 Incontinence of feces 05/30/2016  . COPD exacerbation ([place]) 01/08/2016  . Hypokalemia 02/07/2015  . Cancer of left lung ([place]) 07/25/2011      Prior to Admission medications   Medication Sig Start Date End Date Taking? Authorizing Provider  acetaminophen (TYLENOL) 325 MG tablet Take 325 mg by mouth daily as needed.     [provider]  albuterol (PROVENTIL HFA) 108 (90 Base) MCG/ACT inhaler Inhale 2 puffs into the lungs every 4 (four) hours as needed for wheezing or shortness of breath. Patient not taking: Reported on 10/19/2017 04/30/17   Wilhelmina Mcardle, MD  ALPRAZolam Duanne Moron) 0.25 MG tablet  Take 0.25 mg by mouth 2 (two) times daily.     [provider]  aluminum-magnesium hydroxide-simethicone (MAALOX) 191-478-29 MG/5ML SUSP Take 5 mLs by mouth every 6 (six) hours as needed.    [provider]  amLODipine (NORVASC) 10 MG tablet Take 10 mg by mouth daily.     [provider]  aspirin 325 MG tablet Take 325 mg by mouth daily.     [provider]  atorvastatin (LIPITOR) 20 MG tablet Take 20 mg by mouth daily.    [provider]  ferrous sulfate 325 (65 FE) MG tablet Take by mouth 2 (two) times daily with a meal.     [provider]  fluticasone (FLONASE) 50 MCG/ACT nasal spray Place 2 sprays into both nostrils daily. 07/20/17 07/20/18  Wilhelmina Mcardle, MD  Fluticasone-Salmeterol (ADVAIR DISKUS) 250-50 MCG/DOSE AEPB Inhale 1 puff into the lungs 2 (two) times daily.    [provider]  lisinopril (PRINIVIL,ZESTRIL) 10 MG tablet Take 10 mg by mouth daily.     [provider]  magnesium hydroxide (MILK OF MAGNESIA) 400 MG/5ML suspension Take 40 mLs by mouth daily.     [provider]  Memantine HCl-Donepezil HCl (NAMZARIC) 28-10 MG CP24 Take 1 capsule by mouth daily.    [provider]  metFORMIN (GLUCOPHAGE) 500 MG tablet Take 500 mg by mouth 2 (two) times daily with a meal.  [provider]  modafinil (PROVIGIL) 100 MG tablet Take 100 mg by mouth daily.     [provider]  Multiple Vitamin (MULTI-VITAMINS) TABS Take 1 tablet by mouth daily.     [provider]  omeprazole (PRILOSEC) 40 MG capsule Take 40 mg by mouth daily.     [provider]  perphenazine (TRILAFON) 4 MG tablet Take 4 mg by mouth 3 (three) times daily.    [provider]  QUEtiapine (SEROQUEL XR) 300 MG 24 hr tablet Take 300 mg by mouth at bedtime.    [provider]  Vitamin D, Ergocalciferol, (DRISDOL) 50000 UNITS CAPS capsule Take 50,000 Units by mouth every 30 (thirty) days.      [provider]    Allergies Patient has no known allergies.    Social History Social History   Tobacco Use  . Smoking status: Former Smoker    Packs/day: 0.25    Types: Cigarettes    Quit date: 08/30/2016    Years since quitting: 2.6  . Smokeless tobacco: Never Used  Substance Use Topics  . Alcohol use: No  . Drug use: No    Review of Systems Patient denies headaches, rhinorrhea, blurry vision, numbness, shortness of breath, chest pain, edema, cough, abdominal pain, nausea, vomiting, diarrhea, dysuria, fevers, rashes or hallucinations unless otherwise stated above in HPI. ____________________________________________   PHYSICAL EXAM:  VITAL SIGNS: Vitals:   05/09/19 1834 05/09/19 1947  BP:  (!) 160/94  Pulse: (!) 102 96  Resp: (!) 28 (!) 22  SpO2: 93% 100%    Constitutional: Alert, chronically ill appearing Eyes: Conjunctivae are normal.  Head: Atraumatic. Nose: No congestion/rhinnorhea. Mouth/Throat: Mucous membranes are moist.   Neck: No stridor. Painless ROM.  Cardiovascular: Normal rate, regular rhythm. Grossly normal heart sounds.  Good peripheral circulation. Respiratory: mild tachypnea   diffuse expiratory wheeze  gastrointestinal: Soft and nontender. No distention. No abdominal bruits. No CVA tenderness. Genitourinary: deferred Musculoskeletal: No lower extremity tenderness, trace BLE edema.  No joint effusions. Neurologic:  Normal speech and language. No gross focal neurologic deficits are appreciated. No facial droop Skin:  Skin is warm, dry and intact. No rash noted. Psychiatric: Mood and affect are normal. Speech and behavior are normal.  ____________________________________________   LABS (all labs ordered are listed, but only abnormal results are displayed)  Results for orders placed or performed during the hospital encounter of 05/09/19 (from the past 24 hour(s))  Respiratory Panel by RT PCR (Flu A&B, Covid) - Nasopharyngeal Swab      Status: None   Collection Time: 05/09/19  4:16 PM   Specimen: Nasopharyngeal Swab  Result Value Ref Range   SARS Coronavirus 2 by RT PCR NEGATIVE NEGATIVE   Influenza A by PCR NEGATIVE NEGATIVE   Influenza B by PCR NEGATIVE NEGATIVE  CBC with Differential/Platelet     Status: Abnormal   Collection Time: 05/09/19  4:17 PM  Result Value Ref Range   WBC 14.1 (H) 4.0 - 10.5 K/uL   RBC 4.03 3.87 - 5.11 MIL/uL   Hemoglobin 10.8 (L) 12.0 - 15.0 g/dL   HCT 34.6 (L) 36.0 - 46.0 %   MCV 85.9 80.0 - 100.0 fL   MCH 26.8 26.0 - 34.0 pg   MCHC 31.2 30.0 - 36.0 g/dL   RDW 14.1 11.5 - 15.5 %   Platelets 282 150 - 400 K/uL   nRBC 0.0 0.0 - 0.2 %   Neutrophils Relative % 78 %   Neutro Abs 10.8 (  H) 1.7 - 7.7 K/uL   Lymphocytes Relative 12 %   Lymphs Abs 1.8 0.7 - 4.0 K/uL   Monocytes Relative 9 %   Monocytes Absolute 1.2 (H) 0.1 - 1.0 K/uL   Eosinophils Relative 1 %   Eosinophils Absolute 0.2 0.0 - 0.5 K/uL   Basophils Relative 0 %   Basophils Absolute 0.0 0.0 - 0.1 K/uL   Immature Granulocytes 0 %   Abs Immature Granulocytes 0.06 0.00 - 0.07 K/uL  Comprehensive metabolic panel     Status: Abnormal   Collection Time: 05/09/19  4:17 PM  Result Value Ref Range   Sodium 137 135 - 145 mmol/L   Potassium 3.8 3.5 - 5.1 mmol/L   Chloride 102 98 - 111 mmol/L   CO2 27 22 - 32 mmol/L   Glucose, Bld 191 (H) 70 - 99 mg/dL   BUN 19 8 - 23 mg/dL   Creatinine, Ser 1.35 (H) 0.44 - 1.00 mg/dL   Calcium 9.0 8.9 - 10.3 mg/dL   Total Protein 8.1 6.5 - 8.1 g/dL   Albumin 3.7 3.5 - 5.0 g/dL   AST 22 15 - 41 U/L   ALT 16 0 - 44 U/L   Alkaline Phosphatase 82 38 - 126 U/L   Total Bilirubin 0.6 0.3 - 1.2 mg/dL   GFR calc non Af Amer 38 (L) >60 mL/min   GFR calc Af Amer 44 (L) >60 mL/min   Anion gap 8 5 - 15  Brain natriuretic peptide     Status: Abnormal   Collection Time: 05/09/19  4:17 PM  Result Value Ref Range   B Natriuretic Peptide 224.0 (H) 0.0 - 100.0 pg/mL  Troponin I (High Sensitivity)      Status: Abnormal   Collection Time: 05/09/19  6:52 PM  Result Value Ref Range   Troponin I (High Sensitivity) 40 (H) <18 ng/L   ____________________________________________  EKG [name] review and personal interpretation at Time: 16:13   Indication: sob  Rate: 100  Rhythm: sinus Axis: normal Other: nonspecific st abn, rbbb, ____________________________________________  RADIOLOGY  I personally reviewed all radiographic images ordered to evaluate for the above acute complaints and reviewed radiology reports and findings.  These findings were personally discussed with the patient.  Please see medical record for radiology report.  ____________________________________________   PROCEDURES  Procedure(s) performed:  .Critical Care Performed by: Merlyn Lot, MD Authorized by: Merlyn Lot, MD   Critical care provider statement:    Critical care time (minutes):  35   Critical care time was exclusive of:  Separately billable procedures and treating other patients   Critical care was necessary to treat or prevent imminent or life-threatening deterioration of the following conditions:  Respiratory failure   Critical care was time spent personally by me on the following activities:  Development of treatment plan with patient or surrogate, discussions with consultants, evaluation of patient's response to treatment, examination of patient, obtaining history from patient or surrogate, ordering and performing treatments and interventions, ordering and review of laboratory studies, ordering and review of radiographic studies, pulse oximetry, re-evaluation of patient's condition and review of old charts      Critical Care performed: yes ____________________________________________   INITIAL IMPRESSION / [place] / ED COURSE  Pertinent labs & imaging results that were available during [name] care of the patient were reviewed by me and considered in [name] medical decision making (see  chart for details).   DDX: Asthma, copd, CHF, pna, ptx, malignancy, Pe, anemia  Telina Kleckley is a 79 y.o. who presents to the ED with presentation as described above.  Patient does have some wheezing on exam no hypoxia upon arrival.  Will order blood work as well as imaging for the but differential.  Suspect the patient is going require CT angiogram given her history to exclude PE as [name] review of the chest x-ray shows possible CHF the it sounds more likely COPD or emphysema.  The patient will be placed on continuous pulse oximetry and telemetry for monitoring.  Laboratory evaluation will be sent to evaluate for the above complaints.     Clinical Course as of May 09 1998  Mon May 09, 2019  1914 Patient having worsening wheezing shortness of breath.  Significant increased work of breathing.  Speaking only 1-2 word phrases.  Have ordered additional nebs.  I do feel patient is going require BiPAP as she was found to be hypoxic to 84% on room air.  She is placed on supplemental oxygen.  Have notified respiratory of her need for BiPAP.  Will require admission.   [PR]  1938 Patient feels improved on BiPAP.  She is not requiring significant FiO2 increases.  She appears stable on 40% but her work of breathing has significantly improved.  She does appear clinically stabilized and appropriate for admission to the hospital at this point.   [PR]    Clinical Course User Index [PR] Merlyn Lot, MD    The patient was evaluated in Emergency Department today for the symptoms described in the history of present illness. He/she was evaluated in the context of the global COVID-19 pandemic, which necessitated consideration that the patient might be at risk for infection with the SARS-CoV-2 virus that causes COVID-19. Institutional protocols and algorithms that pertain to the evaluation of patients at risk for COVID-19 are in a state of rapid change based on information released by regulatory bodies including  the CDC and federal and state organizations. These policies and algorithms were followed during the patient's care in the ED.  As part of [name] medical decision making, I reviewed the following data within the [facility] notes reviewed and incorporated, Labs reviewed, notes from prior ED visits and [place] Controlled Substance Database   ____________________________________________   FINAL CLINICAL IMPRESSION(S) / ED DIAGNOSES  Final diagnoses:  Acute respiratory failure with hypoxia ([place])  COPD exacerbation ([place])      NEW MEDICATIONS STARTED DURING THIS VISIT:  New Prescriptions   No medications on file     Note:  This document was prepared using Dragon voice recognition software and may include unintentional dictation errors.    Merlyn Lot, MD 05/09/19 2001

## 2019-05-09 NOTE — ED Notes (Signed)
Placed back on BiPap, O2 sat 100, RR 24, no acute distress noted or reported

## 2019-05-09 NOTE — ED Notes (Signed)
Pt had soaked a brief. Pt placed in clean brief and chux pad. Pt pulled up in bed and sitting up straight now. Pt resting comfortably. Pt given 2 warm blankets at this time.

## 2019-05-09 NOTE — ED Notes (Signed)
Caregiver at [facility] called and was provided with update; pt being admitted.

## 2019-05-09 NOTE — ED Triage Notes (Signed)
Pt comes via [facility] from [facility]ith c/o SOB. EMS states the SOB started today. Pt was 93% RA.  Pt arrives alert and currently 96% RA. Pt has audible rhonci and wheezing.  Pt has labored breathing and accessory muscle use.   Pt has cough that sounds dry. EMS reports BP-185/96, HR-90. Pt was given 2 duonebs in route.

## 2019-05-09 NOTE — Progress Notes (Signed)
Transported pt to ICU 10 on Bipap without incident. Pt remains on vent and is tol well at this time.

## 2019-05-09 NOTE — ED Notes (Signed)
Admitting MD at bedside; BiPap on standby and 3L [place] placed during evaluation.  O2 sat 100, RR 20 at this time on 3L [place].

## 2019-05-09 NOTE — H&P (Signed)
History and Physical    Di Jasmer RWE:[id number] DOB: Dec 09, 1940 DOA: 05/09/2019  Referring MD/NP/PA: Dr. Quentin Cornwall PCP: Letta Median, MD   Patient coming from: ALF, [place]  Chief Complaint: SOB and low oxygen levels  HPI: Cheryl [name] is a 79 y.o. female with medical history significant of COPD, dementia, DM, GERD, HTN, HLD, lung cancer, schizophrenia who presents to [facility] due to low oxygen levels noted by ALF staff at [facility]. History obtained from previous documentation as well as [name], patient's care coordinator at [employer] as patient is unable to provide history due to her dementia. She was at her ALF when she had complained of shortness of breath this morning. She was given her inhaler with initial improvement in her dyspnea however this afternoon dyspnea worsened again with her oxygen levels dropping to 86-87% per [place]. Due to this she was brought to ED via EMS. EMS also gave her nebulizers with some improvement reported. ROS unable to be obtained although ED reported complaint of pleuritic chest pain on arrival although she denied this when specifically asked during my questioning.  ED Course:  In the ED, she was noted to be hypoxic to 84% on room air and was initially placed on nasal cannula with improvement in oxygen saturation however she continued to have increased work up breathing. She underwent CXR revealing possible edema however then receiving CTA chest given tachycardia and tachypnea that was negative for PE although was positive for emphysematous changes. Due to increased work up breathing she was placed on BiPAP with an FiO2 stable at 40% with oxygen saturation of 97-100% while I was in the room. She reported feeling improved and comfortable however was unable to tell me the year, her location, or the president.  Of note, she was able to come off of BiPAP during questioning without any significant respiratory distress however was placed back on BiPAP after  questioning. [name] does report that she uses CPAP at bedtime at her ALF.   She is admitted to [facility] with inpatient status for hypoxic respiratory failure secondary to COPD exacerbation.  Review of Systems: Unable to assess due to clinical status   Past Medical History:  Diagnosis Date  . COPD (chronic obstructive pulmonary disease) ([place])   . Dementia ([place])   . Diabetes mellitus without complication ([place])   . GERD (gastroesophageal reflux disease)   . Hyperlipidemia   . Hypertension   . Lung cancer ([place])   . Mental retardation   . Schizophrenia [facility])     Past Surgical History:  Procedure Laterality Date  . ABDOMINAL HYSTERECTOMY       reports that she quit smoking about 2 years ago. Her smoking use included cigarettes. She smoked 0.25 packs per day. She has never used smokeless tobacco. She reports that she does not drink alcohol or use drugs.  No Known Allergies  Family History  Problem Relation Age of Onset  . Breast cancer Neg Hx     Prior to Admission medications   Medication Sig Start Date End Date Taking? Authorizing Provider  acetaminophen (TYLENOL) 325 MG tablet Take 650 mg by mouth every 4 (four) hours as needed for mild pain or fever.    Yes [provider]  albuterol (VENTOLIN HFA) 108 (90 Base) MCG/ACT inhaler Inhale 2 puffs into the lungs every 4 (four) hours as needed for wheezing or shortness of breath.   Yes [provider]  ALPRAZolam (XANAX) 0.25 MG tablet Take 0.25 mg by mouth 2 (two) times daily.  Yes [provider]  aluminum-magnesium hydroxide-simethicone (MAALOX) 200-200-20 MG/5ML SUSP Take 30 mLs by mouth daily as needed (indegestion or heartburn).    Yes [provider]  amLODipine (NORVASC) 10 MG tablet Take 10 mg by mouth daily.    Yes [provider]  aspirin 325 MG tablet Take 325 mg by mouth daily.    Yes [provider]  atorvastatin (LIPITOR) 20 MG tablet Take 20 mg by mouth at bedtime.     Yes [provider]  bismuth subsalicylate (PEPTO BISMOL) 262 MG/15ML suspension Take 15 mLs by mouth every 2 (two) hours as needed (nausea or vomiting). (max 6 doses in 24 hours)   Yes [provider]  ferrous sulfate 325 (65 FE) MG tablet Take 325 mg by mouth 2 (two) times daily with a meal.    Yes [provider]  fluticasone (FLONASE) 50 MCG/ACT nasal spray Place 2 sprays into both nostrils daily.   Yes [provider]  Fluticasone-Salmeterol (ADVAIR DISKUS) 250-50 MCG/DOSE AEPB Inhale 1 puff into the lungs 2 (two) times daily.   Yes [provider]  lisinopril (PRINIVIL,ZESTRIL) 10 MG tablet Take 10 mg by mouth daily.    Yes [provider]  loperamide (IMODIUM) 2 MG capsule Take 2 mg by mouth as needed for diarrhea or loose stools. (max 4 doses in 12 hours)   Yes [provider]  magnesium hydroxide (MILK OF MAGNESIA) 400 MG/5ML suspension Take 30 mLs by mouth daily as needed for moderate constipation.    Yes [provider]  Memantine HCl-Donepezil HCl (NAMZARIC) 28-10 MG CP24 Take 1 capsule by mouth daily.   Yes [provider]  metFORMIN (GLUCOPHAGE) 500 MG tablet Take 500 mg by mouth 2 (two) times daily with a meal.    Yes [provider]  modafinil (PROVIGIL) 100 MG tablet Take 100 mg by mouth daily.    Yes [provider]  Multiple Vitamin (MULTI-VITAMINS) TABS Take 1 tablet by mouth daily.    Yes [provider]  omeprazole (PRILOSEC) 40 MG capsule Take 40 mg by mouth daily.    Yes [provider]  perphenazine (TRILAFON) 4 MG tablet Take 4 mg by mouth 3 (three) times daily.   Yes [provider]  Phenylephrine-DM-GG ([employer] TUSSIN COUGH/COLD CF) 5-10-100 MG/5ML LIQD Take 10 mLs by mouth every 6 (six) hours as needed (cough).   Yes [provider]  QUEtiapine (SEROQUEL) 300 MG tablet Take 300 mg by mouth at bedtime.   Yes [provider]  sodium  phosphate (FLEET) 7-19 GM/118ML ENEM Place 1 enema rectally daily as needed for severe constipation.   Yes [provider]  Vitamin D, Ergocalciferol, (DRISDOL) 50000 UNITS CAPS capsule Take 50,000 Units by mouth every 30 (thirty) days.    Yes [provider]    Physical Exam: Vitals:   05/09/19 1947 05/09/19 2000 05/09/19 2025 05/09/19 2117  BP: (!) 160/94 (!) 157/79 (!) 157/79 (!) 158/74  Pulse: 96  92 91  Resp: (!) 22 (!) 23 (!) 22 (!) 22  SpO2: 100%  100% 100%  Weight:      Height:          Constitutional: NAD, calm, comfortable, On BiPAP Vitals:   05/09/19 1947 05/09/19 2000 05/09/19 2025 05/09/19 2117  BP: (!) 160/94 (!) 157/79 (!) 157/79 (!) 158/74  Pulse: 96  92 91  Resp: (!) 22 (!) 23 (!) 22 (!) 22  SpO2: 100%  100% 100%  Weight:  Height:       Eyes: PERRL, lids and conjunctivae normal ENMT: Mucous membranes are moist. Posterior pharynx clear of any exudate or lesions.Normal dentition.  Neck: normal, supple, no masses, no thyromegaly Respiratory: + expiratory wheezing bilaterally, no crackles. Normal respiratory effort on BiPAP. No accessory muscle use.  Cardiovascular: Regular rate and rhythm, no murmurs / rubs / gallops. 1+ LE extremity edema bilaterally. 2+ pedal pulses.   Abdomen: no tenderness, no masses palpated. No hepatosplenomegaly. Bowel sounds positive.  Musculoskeletal: no clubbing / cyanosis. No joint deformity upper and lower extremities. Good ROM, no contractures. Normal muscle tone.  Skin: no rashes, lesions, ulcers. No induration Neurologic: CN 2-12 grossly intact. Sensation intact.  Strength 5/5 in all 4.  Psychiatric: Normal judgment and insight. Alert and oriented x 3. Normal mood.   Labs on Admission: I have personally reviewed following labs and imaging studies  CBC: Recent Labs  Lab 05/09/19 1617  WBC 14.1*  NEUTROABS 10.8*  HGB 10.8*  HCT 34.6*  MCV 85.9  PLT 073   Basic Metabolic Panel: Recent Labs  Lab  05/09/19 1617  NA 137  K 3.8  CL 102  CO2 27  GLUCOSE 191*  BUN 19  CREATININE 1.35*  CALCIUM 9.0   GFR: Estimated Creatinine Clearance: 35.8 mL/min (A) (by C-G formula based on SCr of 1.35 mg/dL (H)). Liver Function Tests: Recent Labs  Lab 05/09/19 1617  AST 22  ALT 16  ALKPHOS 82  BILITOT 0.6  PROT 8.1  ALBUMIN 3.7   No results for input(s): LIPASE, AMYLASE in the last 168 hours. No results for input(s): AMMONIA in the last 168 hours. Coagulation Profile: No results for input(s): INR, PROTIME in the last 168 hours. Cardiac Enzymes: No results for input(s): CKTOTAL, CKMB, CKMBINDEX, TROPONINI in the last 168 hours. BNP (last 3 results) No results for input(s): PROBNP in the last 8760 hours. HbA1C: No results for input(s): HGBA1C in the last 72 hours. CBG: No results for input(s): GLUCAP in the last 168 hours. Lipid Profile: No results for input(s): CHOL, HDL, LDLCALC, TRIG, CHOLHDL, LDLDIRECT in the last 72 hours. Thyroid Function Tests: No results for input(s): TSH, T4TOTAL, FREET4, T3FREE, THYROIDAB in the last 72 hours. Anemia Panel: No results for input(s): VITAMINB12, FOLATE, FERRITIN, TIBC, IRON, RETICCTPCT in the last 72 hours. Urine analysis:    Component Value Date/Time   COLORURINE AMBER (A) 05/04/2016 0959   APPEARANCEUR HAZY (A) 05/04/2016 0959   APPEARANCEUR Hazy 07/26/2013 1013   LABSPEC 1.021 05/04/2016 0959   LABSPEC 1.029 07/26/2013 1013   PHURINE 5.0 05/04/2016 0959   GLUCOSEU NEGATIVE 05/04/2016 0959   GLUCOSEU Negative 07/26/2013 1013   HGBUR NEGATIVE 05/04/2016 0959   BILIRUBINUR NEGATIVE 05/04/2016 0959   BILIRUBINUR Negative 07/26/2013 1013   KETONESUR NEGATIVE 05/04/2016 0959   PROTEINUR 30 (A) 05/04/2016 0959   NITRITE NEGATIVE 05/04/2016 0959   LEUKOCYTESUR NEGATIVE 05/04/2016 0959   LEUKOCYTESUR Negative 07/26/2013 1013   Sepsis Labs: @LABRCNTIP (procalcitonin:4,lacticidven:4) ) Recent Results (from the past 240 hour(s))    Respiratory Panel by RT PCR (Flu A&B, Covid) - Nasopharyngeal Swab     Status: None   Collection Time: 05/09/19  4:16 PM   Specimen: Nasopharyngeal Swab  Result Value Ref Range Status   SARS Coronavirus 2 by RT PCR NEGATIVE NEGATIVE Final    Comment: (NOTE) SARS-CoV-2 target nucleic acids are NOT DETECTED. The SARS-CoV-2 RNA is generally detectable in upper respiratoy specimens during the acute phase of infection. The lowest concentration of  SARS-CoV-2 viral copies this assay can detect is 131 copies/mL. A negative result does not preclude SARS-Cov-2 infection and should not be used as the sole basis for treatment or other patient management decisions. A negative result may occur with  improper specimen collection/handling, submission of specimen other than nasopharyngeal swab, presence of viral mutation(s) within the areas targeted by this assay, and inadequate number of viral copies (<131 copies/mL). A negative result must be combined with clinical observations, patient history, and epidemiological information. The expected result is Negative. Fact Sheet for Patients:  PinkCheek.be Fact Sheet for Healthcare Providers:  GravelBags.it This test is not yet ap proved or cleared by the [place] FDA and  has been authorized for detection and/or diagnosis of SARS-CoV-2 by FDA under an Emergency Use Authorization (EUA). This EUA will remain  in effect (meaning this test can be used) for the duration of the COVID-19 declaration under Section 564(b)(1) of the Act, 21 U.S.C. section 360bbb-3(b)(1), unless the authorization is terminated or revoked sooner.    Influenza A by PCR NEGATIVE NEGATIVE Final   Influenza B by PCR NEGATIVE NEGATIVE Final    Comment: (NOTE) The Xpert Xpress SARS-CoV-2/FLU/RSV assay is intended as an aid in  the diagnosis of influenza from Nasopharyngeal swab specimens and  should not be used as a sole  basis for treatment. Nasal washings and  aspirates are unacceptable for Xpert Xpress SARS-CoV-2/FLU/RSV  testing. Fact Sheet for Patients: PinkCheek.be Fact Sheet for Healthcare Providers: GravelBags.it This test is not yet approved or cleared by the [place] FDA and  has been authorized for detection and/or diagnosis of SARS-CoV-2 by  FDA under an Emergency Use Authorization (EUA). This EUA will remain  in effect (meaning this test can be used) for the duration of the  Covid-19 declaration under Section 564(b)(1) of the Act, 21  U.S.C. section 360bbb-3(b)(1), unless the authorization is  terminated or revoked. Performed at [facility], [address] [place], [place] [address]      Radiological Exams on Admission: CT Angio Chest PE W and/or Wo Contrast  Result Date: 05/09/2019 CLINICAL DATA:  Shortness of breath and wheezing EXAM: CT ANGIOGRAPHY CHEST WITH CONTRAST TECHNIQUE: Multidetector CT imaging of the chest was performed using the standard protocol during bolus administration of intravenous contrast. Multiplanar CT image reconstructions and MIPs were obtained to evaluate the vascular anatomy. CONTRAST:  9mL OMNIPAQUE IOHEXOL 350 MG/ML SOLN COMPARISON:  10/13/2017 FINDINGS: Cardiovascular: Thoracic aorta demonstrates atherosclerotic calcifications without aneurysmal dilatation or dissection. No significant cardiac enlargement is noted. The pulmonary artery is mildly prominent centrally although no filling defect to suggest pulmonary embolism is seen. Coronary calcifications are noted. Mediastinum/Nodes: The thoracic inlet is within normal limits. Scattered small mediastinal lymph nodes are noted. The esophagus as visualized is within normal limits. Sliding-type hiatal hernia is noted. Lungs/Pleura: 6 mm nodule is noted in the right upper lobe best seen on image number 29 of series 6 and image number 31 of series 9.  This is new from the prior exam. Diffuse emphysematous changes are identified. There are changes consistent with post radiation therapy and scarring emanating from the left hilum superiorly as well as into the lower lobe. These changes are mildly progressed from the prior exam and consistent with increased scarring. No new focal parenchymal nodule on the left is noted. Upper Abdomen: Hypodensity is noted within the liver similar to that seen on prior exam likely representing cyst or hemangioma. Previously seen hypodensity within the medial segment of the  left lobe of the liver is noted as well. Fatty infiltration of the liver is seen. The remainder of the upper abdomen is within normal limits. Musculoskeletal: Degenerative changes of the thoracic spine are noted. No acute bony abnormality is seen. Review of the MIP images confirms the above findings. IMPRESSION: No evidence of pulmonary emboli. Changes of prior radiation therapy on the left with increased scarring when compared with the prior exam. Hiatal hernia. Chronic changes in the upper abdomen as described. Aortic Atherosclerosis (ICD10-I70.0) and Emphysema (ICD10-J43.9). Electronically Signed   By: Inez Catalina M.D.   On: 05/09/2019 18:39   DG Chest Portable 1 View  Result Date: 05/09/2019 CLINICAL DATA:  Short of breath rule out edema EXAM: PORTABLE CHEST 1 VIEW COMPARISON:  01/08/2016 FINDINGS: Heart size upper normal. Vascular congestion with bilateral interstitial edema. No significant pleural effusion. Atherosclerotic aortic arch. Advanced degenerative changes in the shoulders bilaterally. IMPRESSION: Congestive heart failure with mild interstitial edema. Electronically Signed   By: Franchot Gallo M.D.   On: 05/09/2019 16:33    EKG: Independently reviewed. Tachycardic with 1st degree AV block ad right bundle branch block  Assessment/Plan Active Problems:   Moderate dementia, with behavioral disturbance (HCC)   COPD with acute exacerbation  (HCC)   Respiratory failure with hypoxia and hypercapnia (HCC)   Hyperglycemia due to diabetes mellitus (HCC)   GERD (gastroesophageal reflux disease)   HTN (hypertension)   HLD (hyperlipidemia)  #COPD exacerbation - History of COPD with increased work of breathing and wheezing on exam consistent with COPD exacerbation - IV solumedrol 120 mg given in the ED and will transition to 40 mg PO prednisone in AM - Pulm toilet - Continuous pulse ox - Duoneb PRN - Dulera 2 puffs BID - 1 g IV rocephin x 5 days - Continue BiPAP overnight and wean FiO2 as able to maintain O2 >90%  #Hypoxic and hypercapneic respiratory failure - Secondary to above - Treating as above and continue BiPAP - Will need ambulatory O2 prior to discharge as well  #Hyperglycemia with associated diabetes - Holding metformin - SSI with ACHS accuchecks. May need basal insulin given steroid requirement  #GERD - Continue PPI and maalox as needed  #Dementia with behavioral disturbance - Continue memantine-donepezil - Continue seroquel  - Given hypoxia and hypercapnia will hold ativan for tonight however may need to be restarted in the AM  #HTN - Continue amlodipine  DVT prophylaxis: Heparin SQ Code Status: Full Family Communication: Spoke to ALF staff, [name], patient's care coordinator Disposition Plan: Assisted living  Consults called: None Admission status: Inpatient   Arlan Organ DO [facility]  05/09/2019, 9:20 PM

## 2019-05-09 NOTE — ED Notes (Signed)
Spoke with [facility] pts care coordinator from [place] and updated.  Pt aware.  Call with any updates or if you need anything new information due to pt's dementia. [phone]

## 2019-05-09 NOTE — ED Notes (Signed)
Lab to add on BNP per [facility]

## 2019-05-10 DIAGNOSIS — J9601 Acute respiratory failure with hypoxia: Secondary | ICD-10-CM

## 2019-05-10 DIAGNOSIS — R0602 Shortness of breath: Secondary | ICD-10-CM

## 2019-05-10 LAB — GLUCOSE, CAPILLARY
Glucose-Capillary: 173 mg/dL — ABNORMAL HIGH (ref 70–99)
Glucose-Capillary: 171 mg/dL — ABNORMAL HIGH (ref 70–99)
Glucose-Capillary: 131 mg/dL — ABNORMAL HIGH (ref 70–99)
Glucose-Capillary: 97 mg/dL (ref 70–99)
Glucose-Capillary: 141 mg/dL — ABNORMAL HIGH (ref 70–99)

## 2019-05-10 LAB — CBC WITH DIFFERENTIAL/PLATELET
Abs Immature Granulocytes: 0.04 10*3/uL (ref 0.00–0.07)
Basophils Absolute: 0 10*3/uL (ref 0.0–0.1)
Basophils Relative: 0 %
Eosinophils Absolute: 0 10*3/uL (ref 0.0–0.5)
Eosinophils Relative: 0 %
HCT: 37.3 % (ref 36.0–46.0)
Hemoglobin: 11.2 g/dL — ABNORMAL LOW (ref 12.0–15.0)
Immature Granulocytes: 0 %
Lymphocytes Relative: 6 %
Lymphs Abs: 0.6 10*3/uL — ABNORMAL LOW (ref 0.7–4.0)
MCH: 26.5 pg (ref 26.0–34.0)
MCHC: 30 g/dL (ref 30.0–36.0)
MCV: 88.2 fL (ref 80.0–100.0)
Monocytes Absolute: 0.1 10*3/uL (ref 0.1–1.0)
Monocytes Relative: 1 %
Neutro Abs: 9 10*3/uL — ABNORMAL HIGH (ref 1.7–7.7)
Neutrophils Relative %: 93 %
Platelets: 285 10*3/uL (ref 150–400)
RBC: 4.23 MIL/uL (ref 3.87–5.11)
RDW: 13.7 % (ref 11.5–15.5)
WBC: 9.7 10*3/uL (ref 4.0–10.5)
nRBC: 0 % (ref 0.0–0.2)

## 2019-05-10 LAB — URINALYSIS, ROUTINE W REFLEX MICROSCOPIC
Bacteria, UA: NONE SEEN
Bilirubin Urine: NEGATIVE
Glucose, UA: 50 mg/dL — AB
Hgb urine dipstick: NEGATIVE
Ketones, ur: 5 mg/dL — AB
Leukocytes,Ua: NEGATIVE
Nitrite: NEGATIVE
Protein, ur: 30 mg/dL — AB
Specific Gravity, Urine: 1.038 — ABNORMAL HIGH (ref 1.005–1.030)
pH: 5 (ref 5.0–8.0)

## 2019-05-10 LAB — COMPREHENSIVE METABOLIC PANEL
ALT: 15 U/L (ref 0–44)
AST: 18 U/L (ref 15–41)
Albumin: 3.6 g/dL (ref 3.5–5.0)
Alkaline Phosphatase: 84 U/L (ref 38–126)
Anion gap: 11 (ref 5–15)
BUN: 19 mg/dL (ref 8–23)
CO2: 25 mmol/L (ref 22–32)
Calcium: 9.1 mg/dL (ref 8.9–10.3)
Chloride: 103 mmol/L (ref 98–111)
Creatinine, Ser: 1.09 mg/dL — ABNORMAL HIGH (ref 0.44–1.00)
GFR calc Af Amer: 57 mL/min — ABNORMAL LOW (ref >60)
GFR calc non Af Amer: 49 mL/min — ABNORMAL LOW (ref >60)
Glucose, Bld: 202 mg/dL — ABNORMAL HIGH (ref 70–99)
Potassium: 4.5 mmol/L (ref 3.5–5.1)
Sodium: 139 mmol/L (ref 135–145)
Total Bilirubin: 0.6 mg/dL (ref 0.3–1.2)
Total Protein: 8.1 g/dL (ref 6.5–8.1)

## 2019-05-10 LAB — PROCALCITONIN: Procalcitonin: <0.1 ng/mL

## 2019-05-10 LAB — HEMOGLOBIN A1C
Hgb A1c MFr Bld: 6.6 % — ABNORMAL HIGH (ref 4.8–5.6)
Mean Plasma Glucose: 142.72 mg/dL

## 2019-05-10 LAB — HIV ANTIBODY (ROUTINE TESTING W REFLEX): HIV Screen 4th Generation wRfx: NONREACTIVE — AB

## 2019-05-10 MED ORDER — HYDRALAZINE HCL 20 MG/ML IJ SOLN
5.0000 mg | INTRAMUSCULAR | Status: DC | PRN
  Administered 2019-05-11: 5 mg via INTRAVENOUS
  Filled 2019-05-10: qty 1

## 2019-05-10 MED ORDER — METHYLPREDNISOLONE SODIUM SUCC 40 MG IJ SOLR
40.0000 mg | Freq: Two times a day (BID) | INTRAMUSCULAR | Status: DC
  Administered 2019-05-10 – 2019-05-13 (×7): 40 mg via INTRAVENOUS
  Filled 2019-05-10 (×7): qty 1

## 2019-05-10 MED ORDER — INSULIN ASPART 100 UNIT/ML ~~LOC~~ SOLN
0.0000 [IU] | SUBCUTANEOUS | Status: DC
  Administered 2019-05-10 (×2): 3 [IU] via SUBCUTANEOUS
  Administered 2019-05-11: 11 [IU] via SUBCUTANEOUS
  Administered 2019-05-11 (×2): 3 [IU] via SUBCUTANEOUS
  Administered 2019-05-12: 7 [IU] via SUBCUTANEOUS
  Administered 2019-05-12: 3 [IU] via SUBCUTANEOUS
  Administered 2019-05-12: 4 [IU] via SUBCUTANEOUS
  Administered 2019-05-13: 7 [IU] via SUBCUTANEOUS
  Administered 2019-05-13: 3 [IU] via SUBCUTANEOUS
  Administered 2019-05-13: 7 [IU] via SUBCUTANEOUS
  Administered 2019-05-13: 4 [IU] via SUBCUTANEOUS
  Administered 2019-05-13 – 2019-05-14 (×2): 11 [IU] via SUBCUTANEOUS
  Administered 2019-05-14 (×3): 7 [IU] via SUBCUTANEOUS
  Administered 2019-05-15 (×3): 4 [IU] via SUBCUTANEOUS
  Administered 2019-05-15: 18:00:00 11 [IU] via SUBCUTANEOUS
  Administered 2019-05-15: 20:00:00 15 [IU] via SUBCUTANEOUS
  Administered 2019-05-16: 13:00:00 7 [IU] via SUBCUTANEOUS
  Filled 2019-05-10 (×22): qty 1

## 2019-05-10 MED ORDER — SODIUM CHLORIDE 0.9 % IV SOLN
500.0000 mg | Freq: Every day | INTRAVENOUS | Status: AC
  Administered 2019-05-10 – 2019-05-12 (×3): 500 mg via INTRAVENOUS
  Filled 2019-05-10 (×5): qty 500

## 2019-05-10 MED ORDER — IPRATROPIUM-ALBUTEROL 0.5-2.5 (3) MG/3ML IN SOLN
3.0000 mL | RESPIRATORY_TRACT | Status: DC
  Administered 2019-05-10 – 2019-05-13 (×18): 3 mL via RESPIRATORY_TRACT
  Filled 2019-05-10 (×19): qty 3

## 2019-05-10 MED ORDER — IPRATROPIUM-ALBUTEROL 0.5-2.5 (3) MG/3ML IN SOLN
RESPIRATORY_TRACT | Status: AC
  Administered 2019-05-10: 11:00:00 3 mL
  Filled 2019-05-10: qty 3

## 2019-05-10 NOTE — Progress Notes (Signed)
   05/10/19 1700  Clinical Encounter Type  Visited With Patient  Visit Type Initial  Referral From Other (Comment)  Consult/Referral To Chaplain  While chaplain was walking the floor of ICU, the secretary mentioned this patient. Chaplain stopped in to see patient and asked if she could have prayer with her and she said yes. Chaplain prayed and told patient to get some rest.

## 2019-05-10 NOTE — TOC Initial Note (Signed)
Transition of Care [facility]) - Initial/Assessment Note    Patient Details  Name: Cheryl [name] MRN: [id number] Date of Birth: 14-Oct-1940  [facility] CM/SW Contact:    Magnus Ivan, LCSW Phone Number: 05/10/2019, 1:43 PM  Clinical Narrative:           Per chart review patient is disoriented. CSW attempted call to contact person listed in chart Barbie Haggis) for Readmission Screen. Fritz Pickerel reported he is in charge of a Group Home and patient does not reside there any more.  CSW then called [facility]. Spoke with patient's Care Coordinator at the [place], Etter Sjogren [phone]). Tammy reported patient has no family involved with her. She reported patient has been at [facility] for about 3 years and at one point they had contact with a son (once), but since then have been unable to reach him at all, even with DSS going out to his address to try to get in contact with him. Tammy reported the ALF has reached out to DSS about patient needing a Legal Guardian and that process will be started now.   Per Tammy, patient has Dementia at her baseline. Tammy reported patient was mobile without equipment. Patient uses a CPAP at the ALF. Tammy asked if patient could get a hospital bed and oxygen before discharge. Tammy also reported if patient does need SNF rehab at discharge, they would like [facility] or [facility] if possible. Tammy reported patient has used [facility] services in the past and their preferred agency is [facility] (which was used previously for patient). CSW explained PT and OT evaluations are needed and we will follow up based on those recommendations, Tammy verbalized understanding.   CSW updated chart with Tammy's contact information.   CSW will continue to follow.      Barriers to Discharge: Continued Medical Work up   Patient Goals and CMS Choice   CMS Medicare.gov Compare Post Acute Care list provided to:: Patient Represenative (must comment) Choice offered to /  list presented to : (Care Coordinator Etter Sjogren)  Expected Discharge Plan and Services         Living arrangements for the past 2 months: [place]                                      Prior Living Arrangements/Services Living arrangements for the past 2 months: [place] Lives with:: Facility Resident          Need for Family Participation in Patient Care: (No family is involved.) Care giver support system in place?: (Formal supports at ALF only, no family involved) Current home services: DME Criminal Activity/Legal Involvement Pertinent to Current Situation/Hospitalization: No - Comment as needed  Activities of Daily Living      Permission Sought/Granted Permission sought to share information with : Investment banker, corporate granted to share info w AGENCY: SNFS or [place] agencies if recommended; Equipment agencies        Emotional Assessment       Orientation: : Fluctuating Orientation (Suspected and/or reported [place]) Alcohol / Substance Use: Not Applicable Psych Involvement: No (comment)  Admission diagnosis:  Shortness of breath [R06.02] COPD exacerbation (HCC) [J44.1] Acute respiratory failure with hypoxia ([place]) [J96.01] COPD with acute exacerbation ([place]) [J44.1] Patient Active Problem List   Diagnosis Date Noted  . Acute respiratory failure with hypoxia ([place])   .  Shortness of breath   . COPD with acute exacerbation ([place]) 05/09/2019  . Respiratory failure with hypoxia and hypercapnia ([place]) 05/09/2019  . Hyperglycemia due to diabetes mellitus ([place]) 05/09/2019  . GERD (gastroesophageal reflux disease) 05/09/2019  . HTN (hypertension) 05/09/2019  . HLD (hyperlipidemia) 05/09/2019  . Moderate dementia, with behavioral disturbance ([place]) 10/06/2016  . Incontinence of feces 05/30/2016  . COPD exacerbation ([place]) 01/08/2016  . Hypokalemia 02/07/2015  . Cancer of left lung ([place]) 07/25/2011    PCP:  Letta Median, MD Pharmacy:   [facility] [place] [facility] [place] [address] [place] [address] Phone: [phone] Fax: [phone]     Social Determinants of Health (SDOH) Interventions    Readmission Risk Interventions Readmission Risk Prevention Plan 05/10/2019  Transportation Screening Complete  PCP or Specialist Appt within 3-5 Days Complete  HRI or Home Care Consult Complete  Medication Review (RN Care Manager) Complete  Some recent data might be hidden

## 2019-05-10 NOTE — Progress Notes (Signed)
PT Cancellation Note  Patient Details Name: Cheryl [name] MRN: [id number] DOB: 12-30-1940   Cancelled Treatment:    Reason Eval/Treat Not Completed: Medical issues which prohibited therapy;Patient not medically ready  Pt with respiratory distress after minimal exertion earlier today, now requiring BiPAP.  Will hold PT this date, continue to follow and attempt PT eval when pt is more appropriate/stable.   Kreg Shropshire, DPT 05/10/2019, 12:48 PM

## 2019-05-10 NOTE — Progress Notes (Signed)
SLP Cancellation Note  Patient Details Name: Cheryl [name] MRN: [id number] DOB: 01/23/1940   Cancelled treatment:       Reason Eval/Treat Not Completed: Patient not medically ready;Medical issues which prohibited therapy(chart reviewed; consulted NSG). Currently, pt is on BiPAP d/t increased labored breathing ~1 hour ago. BP remains elevated per NSG. Pt has baseline dxs including Dementia, GERD, lung cancer, Schizophrenia. Noted reports of Dyspnea at her ALF leading to this admission. CXR: "Congestive heart failure with mild interstitial edema"; a Hiatal Hernia was also noted.  ST services will f/u w/ pt's status for appropriate timing for BSE as she weans from BiPAP. Recommend oral care for hygiene and stimulation of swallowing. NSG updated, agreed.     Orinda Kenner, MS, CCC-SLP [facility] [place] 05/10/2019, 11:14 AM

## 2019-05-10 NOTE — Progress Notes (Signed)
PROGRESS NOTE    Cheryl [name]  IRC:[id number] DOB: 20-May-1940 DOA: 05/09/2019 PCP: Letta Median, MD   Brief Narrative:  Cheryl [name] is a 79 y.o. 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 History obtained from previous documentation as well as [name], patient's care coordinator at [employer] as patient is unable to provide history due to her dementia.  Initially treated with BiPAP, weaned off overnight, placed back to BiPAP as patient started desaturating and becoming tachypneic while working with PT in the morning.  Admitted for COPD exacerbation.  Subjective: Patient was mostly mumbling but able to tell her name when seen today.  Unknown baseline.  She seems comfortable during rounding. Later got message from nursing staff that patient become tachypneic and started desaturating while working with PT requiring BiPAP again.  Assessment & Plan:   Active Problems:   Moderate dementia, with behavioral disturbance (HCC)   COPD with acute exacerbation (HCC)   Respiratory failure with hypoxia and hypercapnia (HCC)   Hyperglycemia due to diabetes mellitus (HCC)   GERD (gastroesophageal reflux disease)   HTN (hypertension)   HLD (hyperlipidemia)  Acute on chronic respiratory failure secondary to COPD exacerbation.  Per nursing staff she was on 2 L at home.  Unable to answer any questions for me today except her name. As patient needs to be placed on BiPAP again after some exertion while working with PT. -Switch p.o. prednisone with Solu-Medrol 40 mg twice daily. -Scheduled DuoNeb every 4 hourly. -Continue home dose of Dulera. -Continue as needed albuterol. -Continue with pulmonary toileting. -Try to wean her off from BiPAP -Procalcitonin negative-discontinue ceftriaxone. -Start her on azithromycin for COPD exacerbation.  Nursing  concern of dysphagia.  Was some nursing concern about dysphagia and she was reluctant to give her p.o. meds. -We will obtain swallow evaluation.  Type 2 diabetes mellitus.  A1c of 6.6.  CBG in 170s as patient is on steroid.  On Metformin at home. -Change sliding scale to resistant/steroid. -Might need basal once able to take diet.  Hypertension.  Blood pressure mildly elevated. -Continue with amlodipine. -Add as needed hydralazine.  Dementia with behavioral disturbances. -Continue home dose of memantine and donepezil. -Continue Seroquel. -Ativan was held due to hypoxia and hypercapnia, can be restarted once off the BiPAP and started tolerating p.o.  GERD. -Continue PPI and Maalox as needed.  Objective: Vitals:   05/10/19 0900 05/10/19 0930 05/10/19 1000 05/10/19 1014  BP: (!) 179/115 (!) 193/104 (!) 159/114 (!) 149/120  Pulse: 99 (!) 107 85 (!) 55  Resp: (!) 28 (!) 32 20 18  Temp:      TempSrc:      SpO2: 93% 96% 100% 100%  Weight:      Height:        Intake/Output Summary (Last 24 hours) at 05/10/2019 1108 Last data filed at 05/10/2019 0309 Gross per 24 hour  Intake 150 ml  Output 200 ml  Net -50 ml   Filed Weights   05/09/19 1612 05/09/19 2157 05/10/19 0200  Weight: 83.9 kg 94 kg 93.9 kg    Examination:  General exam: Chronically ill-appearing elderly lady, appears anxious. Respiratory system: Few scattered wheeze.  Increased work of breathing. Cardiovascular system: S1 & S2 heard, RRR.  Gastrointestinal system: Soft, nontender, nondistended, bowel sounds positive. Central nervous system: Alert but oriented to self only, able to tell  just name, no focal deficit. Extremities: No edema, no cyanosis, pulses intact and symmetrical. Psychiatry: Judgement and insight appear impaired.    DVT prophylaxis: Heparin Code Status: Full Family Communication: No family member listed, called listed contact Fritz Pickerel but he was from a group home and states that she no more lives  with them. Disposition Plan:  Status is: Inpatient  Remains inpatient appropriate because:Inpatient level of care appropriate due to severity of illness   Dispo: The patient is from: ALF              Anticipated d/c is to: To be determined              Anticipated d/c date is: 2 days              Patient currently is not medically stable to d/c.  Patient still needs intermittent BiPAP due to COPD exacerbation.  Will also need PT/OT evaluation once more stable.  Appears to have advanced dementia.   Consultants:   None  Procedures:  Antimicrobials:  Azithromycin  Data Reviewed: I have personally reviewed following labs and imaging studies  CBC: Recent Labs  Lab 05/09/19 1617 05/09/19 2118 05/10/19 0435  WBC 14.1* 12.1* 9.7  NEUTROABS 10.8*  --  9.0*  HGB 10.8* 10.7* 11.2*  HCT 34.6* 33.7* 37.3  MCV 85.9 84.7 88.2  PLT 282 279 371   Basic Metabolic Panel: Recent Labs  Lab 05/09/19 1617 05/09/19 2118 05/10/19 0435  NA 137  --  139  K 3.8  --  4.5  CL 102  --  103  CO2 27  --  25  GLUCOSE 191*  --  202*  BUN 19  --  19  CREATININE 1.35* 1.19* 1.09*  CALCIUM 9.0  --  9.1   GFR: Estimated Creatinine Clearance: 45.2 mL/min (A) (by C-G formula based on SCr of 1.09 mg/dL (H)). Liver Function Tests: Recent Labs  Lab 05/09/19 1617 05/10/19 0435  AST 22 18  ALT 16 15  ALKPHOS 82 84  BILITOT 0.6 0.6  PROT 8.1 8.1  ALBUMIN 3.7 3.6   No results for input(s): LIPASE, AMYLASE in the last 168 hours. No results for input(s): AMMONIA in the last 168 hours. Coagulation Profile: No results for input(s): INR, PROTIME in the last 168 hours. Cardiac Enzymes: No results for input(s): CKTOTAL, CKMB, CKMBINDEX, TROPONINI in the last 168 hours. BNP (last 3 results) No results for input(s): PROBNP in the last 8760 hours. HbA1C: Recent Labs    05/09/19 2118  HGBA1C 6.6*   CBG: Recent Labs  Lab 05/09/19 2146 05/09/19 2310 05/10/19 0351 05/10/19 0746  GLUCAP 178*  196* 173* 171*   Lipid Profile: No results for input(s): CHOL, HDL, LDLCALC, TRIG, CHOLHDL, LDLDIRECT in the last 72 hours. Thyroid Function Tests: No results for input(s): TSH, T4TOTAL, FREET4, T3FREE, THYROIDAB in the last 72 hours. Anemia Panel: No results for input(s): VITAMINB12, FOLATE, FERRITIN, TIBC, IRON, RETICCTPCT in the last 72 hours. Sepsis Labs: Recent Labs  Lab 05/10/19 0849  PROCALCITON <0.10    Recent Results (from the past 240 hour(s))  Respiratory Panel by RT PCR (Flu A&B, Covid) - Nasopharyngeal Swab     Status: None   Collection Time: 05/09/19  4:16 PM   Specimen: Nasopharyngeal Swab  Result Value Ref Range Status   SARS Coronavirus 2 by RT PCR NEGATIVE NEGATIVE Final    Comment: (NOTE) SARS-CoV-2 target nucleic acids are NOT DETECTED. The SARS-CoV-2 RNA is generally detectable in upper  respiratoy specimens during the acute phase of infection. The lowest concentration of SARS-CoV-2 viral copies this assay can detect is 131 copies/mL. A negative result does not preclude SARS-Cov-2 infection and should not be used as the sole basis for treatment or other patient management decisions. A negative result may occur with  improper specimen collection/handling, submission of specimen other than nasopharyngeal swab, presence of viral mutation(s) within the areas targeted by this assay, and inadequate number of viral copies (<131 copies/mL). A negative result must be combined with clinical observations, patient history, and epidemiological information. The expected result is Negative. Fact Sheet for Patients:  PinkCheek.be Fact Sheet for Healthcare Providers:  GravelBags.it This test is not yet ap proved or cleared by the [place] FDA and  has been authorized for detection and/or diagnosis of SARS-CoV-2 by FDA under an Emergency Use Authorization (EUA). This EUA will remain  in effect (meaning this test  can be used) for the duration of the COVID-19 declaration under Section 564(b)(1) of the Act, 21 U.S.C. section 360bbb-3(b)(1), unless the authorization is terminated or revoked sooner.    Influenza A by PCR NEGATIVE NEGATIVE Final   Influenza B by PCR NEGATIVE NEGATIVE Final    Comment: (NOTE) The Xpert Xpress SARS-CoV-2/FLU/RSV assay is intended as an aid in  the diagnosis of influenza from Nasopharyngeal swab specimens and  should not be used as a sole basis for treatment. Nasal washings and  aspirates are unacceptable for Xpert Xpress SARS-CoV-2/FLU/RSV  testing. Fact Sheet for Patients: PinkCheek.be Fact Sheet for Healthcare Providers: GravelBags.it This test is not yet approved or cleared by the [place] FDA and  has been authorized for detection and/or diagnosis of SARS-CoV-2 by  FDA under an Emergency Use Authorization (EUA). This EUA will remain  in effect (meaning this test can be used) for the duration of the  Covid-19 declaration under Section 564(b)(1) of the Act, 21  U.S.C. section 360bbb-3(b)(1), unless the authorization is  terminated or revoked. Performed at [facility], [address] [place], [place] [address]   MRSA PCR Screening     Status: None   Collection Time: 05/09/19  9:52 PM   Specimen: Nasal Mucosa; Nasopharyngeal  Result Value Ref Range Status   MRSA by PCR NEGATIVE NEGATIVE Final    Comment:        The GeneXpert MRSA Assay (FDA approved for NASAL specimens only), is one component of a comprehensive MRSA colonization surveillance program. It is not intended to diagnose MRSA infection nor to guide or monitor treatment for MRSA infections. Performed at [facility], [address] [place], [place] [address]      Radiology Studies: CT Angio Chest PE W and/or Wo Contrast  Result Date: 05/09/2019 CLINICAL DATA:  Shortness of breath and wheezing EXAM: CT  ANGIOGRAPHY CHEST WITH CONTRAST TECHNIQUE: Multidetector CT imaging of the chest was performed using the standard protocol during bolus administration of intravenous contrast. Multiplanar CT image reconstructions and MIPs were obtained to evaluate the vascular anatomy. CONTRAST:  28mL OMNIPAQUE IOHEXOL 350 MG/ML SOLN COMPARISON:  10/13/2017 FINDINGS: Cardiovascular: Thoracic aorta demonstrates atherosclerotic calcifications without aneurysmal dilatation or dissection. No significant cardiac enlargement is noted. The pulmonary artery is mildly prominent centrally although no filling defect to suggest pulmonary embolism is seen. Coronary calcifications are noted. Mediastinum/Nodes: The thoracic inlet is within normal limits. Scattered small mediastinal lymph nodes are noted. The esophagus as visualized is within normal limits. Sliding-type hiatal hernia is noted. Lungs/Pleura: 6 mm nodule is noted  in the right upper lobe best seen on image number 29 of series 6 and image number 31 of series 9. This is new from the prior exam. Diffuse emphysematous changes are identified. There are changes consistent with post radiation therapy and scarring emanating from the left hilum superiorly as well as into the lower lobe. These changes are mildly progressed from the prior exam and consistent with increased scarring. No new focal parenchymal nodule on the left is noted. Upper Abdomen: Hypodensity is noted within the liver similar to that seen on prior exam likely representing cyst or hemangioma. Previously seen hypodensity within the medial segment of the left lobe of the liver is noted as well. Fatty infiltration of the liver is seen. The remainder of the upper abdomen is within normal limits. Musculoskeletal: Degenerative changes of the thoracic spine are noted. No acute bony abnormality is seen. Review of the MIP images confirms the above findings. IMPRESSION: No evidence of pulmonary emboli. Changes of prior radiation therapy  on the left with increased scarring when compared with the prior exam. Hiatal hernia. Chronic changes in the upper abdomen as described. Aortic Atherosclerosis (ICD10-I70.0) and Emphysema (ICD10-J43.9). Electronically Signed   By: Inez Catalina M.D.   On: 05/09/2019 18:39   DG Chest Portable 1 View  Result Date: 05/09/2019 CLINICAL DATA:  Short of breath rule out edema EXAM: PORTABLE CHEST 1 VIEW COMPARISON:  01/08/2016 FINDINGS: Heart size upper normal. Vascular congestion with bilateral interstitial edema. No significant pleural effusion. Atherosclerotic aortic arch. Advanced degenerative changes in the shoulders bilaterally. IMPRESSION: Congestive heart failure with mild interstitial edema. Electronically Signed   By: Franchot Gallo M.D.   On: 05/09/2019 16:33    Scheduled Meds: . amLODipine  10 mg Oral Daily  . aspirin  325 mg Oral Daily  . atorvastatin  20 mg Oral QHS  . Chlorhexidine Gluconate Cloth  6 each Topical Daily  . memantine  28 mg Oral Daily   And  . donepezil  10 mg Oral QHS  . ferrous sulfate  325 mg Oral BID WC  . fluticasone  2 spray Each Nare Daily  . heparin  5,000 Units Subcutaneous Q8H  . insulin aspart  0-5 Units Subcutaneous QHS  . insulin aspart  0-9 Units Subcutaneous TID WC  . ipratropium-albuterol  3 mL Nebulization Q4H  . lisinopril  10 mg Oral Daily  . methylPREDNISolone (SOLU-MEDROL) injection  40 mg Intravenous Q12H  . modafinil  100 mg Oral Daily  . mometasone-formoterol  2 puff Inhalation BID  . multivitamin with minerals  1 tablet Oral Daily  . pantoprazole  40 mg Oral Daily  . perphenazine  4 mg Oral TID  . QUEtiapine  300 mg Oral QHS  . [START ON 05/11/2019] Vitamin D (Ergocalciferol)  50,000 Units Oral Q30 days   Continuous Infusions: . azithromycin       LOS: 1 day   Time spent: 50 minutes.  Lorella Nimrod, MD [facility]  If 7PM-7AM, please contact night-coverage [email or web address]  05/10/2019, 11:08 AM   This record has been  created using Systems analyst. Errors have been sought and corrected,but may not always be located. Such creation errors do not reflect on the standard of care.

## 2019-05-10 NOTE — Progress Notes (Signed)
Paged hospitalist regarding patients blood pressure systolic 859'C. Patient placed back on bi-pap-labored breathing mid 30's. Patient unable to tolerate po. Paged MD for IV meds.

## 2019-05-10 NOTE — Evaluation (Signed)
Occupational Therapy Evaluation Patient Details Name: Cheryl [name] MRN: [id number] DOB: [birth year] Today's Date: 05/10/2019    History of Present Illness 78 y.o. 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 Admitted to ICU on BiPAP.   Clinical Impression   Pt was seen for OT evaluation this date. Per chart review, prior to hospital admission, pt was living at an ALF. Pt denies need for assist for ADL and mobility. Unable to verify PLOF or home set up with no caregivers present and pt questionable historian. Pt alert and oriented to self. Noted to be on 5L O2 at start of session with respiration rate in high 20's to low 30's at rest and with rolling in bed, O2 sats >90% and BP 179/115. With bed mobility efforts as instructed in therapist pt able to scoot herself up with significant effort, RR increasing to 41, noted increased work of breath and BP checked 194/108, 193/104. RN in room to assess. RT reapplied BiPAP 2/2 respiratory distress on 5L after minimal bed mobility. Session completed once back on BiPAP. Currently pt demonstrates impairments as described below (See OT problem list) which functionally limit her ability to perform ADL/self-care tasks. Anticipate pt requires Min-Mod A for bathing and dressing tasks. Pt would benefit from skilled OT to address noted impairments and functional limitations (see below for any additional details) in order to maximize safety and independence while minimizing falls risk and caregiver burden. Upon hospital discharge, recommend STR to maximize pt safety and return to PLOF.     Follow Up Recommendations  SNF    Equipment Recommendations  3 in 1 bedside commode    Recommendations for Other Services       Precautions / Restrictions Precautions Precautions: Fall Precaution Comments: watch  vitals Restrictions Weight Bearing Restrictions: No      Mobility Bed Mobility Overal bed mobility: Needs Assistance             General bed mobility comments: Pt able to roll side to side and scoot up in bed with cues for sequencing, no physical assist required  Transfers                 General transfer comment: deferred 2/2 need to go back on BiPAP    Balance                                           ADL either performed or assessed with clinical judgement   ADL Overall ADL's : Needs assistance/impaired                                       General ADL Comments: Limited ADL assessment 2/2 need to go back on BiPAP. Will continue to assess     Vision Patient Visual Report: No change from baseline       Perception     Praxis      Pertinent Vitals/Pain Pain Assessment: No/denies pain     Hand Dominance Right   Extremity/Trunk Assessment Upper Extremity Assessment Upper Extremity Assessment: Generalized weakness   Lower Extremity Assessment Lower Extremity Assessment: Generalized weakness       Communication Communication Communication: Expressive  difficulties(mumbled speech, pt tends to repeat words)   Cognition Arousal/Alertness: Awake/alert Behavior During Therapy: WFL for tasks assessed/performed Overall Cognitive Status: History of cognitive impairments - at baseline                                 General Comments: Pt alert, oriented to self, follows simple commands with cues for sequencing   General Comments  start of session RR low 30's, BP 179/115, O2 in 90's on 5L O2, with bed mobility RR ranges from 27-low 40's, HR 100-116, and BP 193/104, O2 >90% but demonstrates SOB and increased WOB. RN came to assess. Pt placed back on BiPAP and session stopped at that point    Exercises Other Exercises Other Exercises: Pt educated in pursed lip breathing requiring verbal and visual cues to  support breath recovery   Shoulder Instructions      Home Living Family/patient expects to be discharged to:: Unsure(per chart, pt from ALF, pt unreliable historian)                                        Prior Functioning/Environment          Comments: Unable to verify PLOF, pt reporting independent in mobility and ADL tasks, no family/caregivers present to verify        OT Problem List: Decreased strength;Cardiopulmonary status limiting activity;Decreased cognition;Decreased activity tolerance      OT Treatment/Interventions: Self-care/ADL training;Therapeutic exercise;Therapeutic activities;Energy conservation;DME and/or AE instruction;Patient/family education;Balance training    OT Goals(Current goals can be found in the care plan section) Acute Rehab OT Goals Patient Stated Goal: pt did not state OT Goal Formulation: Patient unable to participate in goal setting Time For Goal Achievement: 05/24/19 Potential to Achieve Goals: Good ADL Goals Pt Will Perform Lower Body Dressing: with min assist;sit to/from stand Pt Will Transfer to Toilet: with min assist;bedside commode Additional ADL Goal #1: Pt will utilize learned energy conservation strategies with mod verbal cues.  OT Frequency: Min 1X/week   Barriers to D/C:            Co-evaluation              AM-PAC OT "6 Clicks" Daily Activity     Outcome Measure Help from another person eating meals?: None Help from another person taking care of personal grooming?: A Little Help from another person toileting, which includes using toliet, bedpan, or urinal?: A Lot Help from another person bathing (including washing, rinsing, drying)?: A Lot Help from another person to put on and taking off regular upper body clothing?: A Little Help from another person to put on and taking off regular lower body clothing?: A Lot 6 Click Score: 16   End of Session Nurse Communication: Other  (comment)(vitals)  Activity Tolerance: Treatment limited secondary to medical complications (Comment)(put back on BiPAP) Patient left:    OT Visit Diagnosis: Other abnormalities of gait and mobility (R26.89);Muscle weakness (generalized) (M62.81)                Time: 4235-3614 OT Time Calculation (min): 14 min Charges:  OT General Charges $OT Visit: 1 Visit OT Evaluation $OT Eval Moderate Complexity: 1 Mod  Jeni Salles, MPH, MS, OTR/L ascom [phone] 05/10/19, 1:40 PM

## 2019-05-10 NOTE — Progress Notes (Signed)
Paged Dr Reesa Chew 3 times regarding patient being placed back on bi-pap after PT worked with patient. Increased labored breathing and hypertension. Patient resting comfortably on bi-pap now, blood pressure remains elevated. Unable to take PO. Sent secure message requesting MD to switch medications to IV and PRN blood pressure medication.

## 2019-05-11 LAB — BASIC METABOLIC PANEL
Anion gap: 11 (ref 5–15)
BUN: 18 mg/dL (ref 8–23)
CO2: 29 mmol/L (ref 22–32)
Calcium: 9.5 mg/dL (ref 8.9–10.3)
Chloride: 101 mmol/L (ref 98–111)
Creatinine, Ser: 1.02 mg/dL — ABNORMAL HIGH (ref 0.44–1.00)
GFR calc Af Amer: >60 mL/min (ref >60)
GFR calc non Af Amer: 53 mL/min — ABNORMAL LOW (ref >60)
Glucose, Bld: 130 mg/dL — ABNORMAL HIGH (ref 70–99)
Potassium: 4.3 mmol/L (ref 3.5–5.1)
Sodium: 141 mmol/L (ref 135–145)

## 2019-05-11 LAB — CBC
HCT: 42.8 % (ref 36.0–46.0)
Hemoglobin: 13 g/dL (ref 12.0–15.0)
MCH: 26.5 pg (ref 26.0–34.0)
MCHC: 30.4 g/dL (ref 30.0–36.0)
MCV: 87.3 fL (ref 80.0–100.0)
Platelets: 370 10*3/uL (ref 150–400)
RBC: 4.9 MIL/uL (ref 3.87–5.11)
RDW: 13.7 % (ref 11.5–15.5)
WBC: 11.2 10*3/uL — ABNORMAL HIGH (ref 4.0–10.5)
nRBC: 0 % (ref 0.0–0.2)

## 2019-05-11 LAB — GLUCOSE, CAPILLARY
Glucose-Capillary: 107 mg/dL — ABNORMAL HIGH (ref 70–99)
Glucose-Capillary: 113 mg/dL — ABNORMAL HIGH (ref 70–99)
Glucose-Capillary: 121 mg/dL — ABNORMAL HIGH (ref 70–99)
Glucose-Capillary: 128 mg/dL — ABNORMAL HIGH (ref 70–99)
Glucose-Capillary: 258 mg/dL — ABNORMAL HIGH (ref 70–99)
Glucose-Capillary: 91 mg/dL (ref 70–99)

## 2019-05-11 LAB — MAGNESIUM: Magnesium: 2.2 mg/dL (ref 1.7–2.4)

## 2019-05-11 LAB — PROCALCITONIN: Procalcitonin: 0.22 ng/mL

## 2019-05-11 MED ORDER — LORAZEPAM 2 MG/ML IJ SOLN
1.0000 mg | Freq: Once | INTRAMUSCULAR | Status: AC
  Administered 2019-05-11: 1 mg via INTRAVENOUS
  Filled 2019-05-11: qty 1

## 2019-05-11 NOTE — TOC Progression Note (Addendum)
Transition of Care [facility]) - Progression Note    Patient Details  Name: Chau Savell MRN: [id number] Date of Birth: Dec 08, 1940  Transition of Care [facility]) CM/SW [place], LCSW Phone Number: 05/11/2019, 10:42 AM  Clinical Narrative:   CSW informed by RN that a Richardson Landry called yesterday saying he was patient's son and asking for an update.   CSW called Care Coordinator at [place], [place]. Tammy reported [place] staff reached out to [facility] yesterday. She reported they had to get DSS involved in order to reach [place]. She confirmed that Richardson Landry is patient's son so updates can be given to him. Tammy reported [facility] told DSS yesterday that they feel patient needs a Guardian, and are waiting to hear if DSS is going to pursue Guardianship. Tammy asked to still be involved and updated regarding patient's status and discharge plan when applicable. CSW provided CSW's contact number as well as ICU Main Number that she can call for medical updates.   Updated chart with Steve's information per RN request.  CSW will continue to follow.       Barriers to Discharge: Continued Medical Work up  Expected Discharge Plan and Services         Living arrangements for the past 2 months: [place]                                       Social Determinants of Health (SDOH) Interventions    Readmission Risk Interventions Readmission Risk Prevention Plan 05/10/2019  Transportation Screening Complete  PCP or Specialist Appt within 3-5 Days Complete  HRI or Home Care Consult Complete  Medication Review (RN Care Manager) Complete  Some recent data might be hidden

## 2019-05-11 NOTE — Progress Notes (Signed)
PROGRESS NOTE    Cheryl [name]  CWC:[id number] DOB: [birth year] DOA: 05/09/2019 PCP: Letta Median, MD   Brief Narrative:  Cheryl [name] is a 79 y.o. 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 History obtained from previous documentation as well as [name], patient's care coordinator at [employer] as patient is unable to provide history due to her dementia.  Initially treated with BiPAP, weaned off overnight, placed back to BiPAP as patient started desaturating and becoming tachypneic while working with PT in the morning.  Admitted for COPD exacerbation.  Subjective:  Patient was awake and alert and speaking to me comfortably when I went to see her this morning.  She said she was doing fine.  Denied feeling short of breath.  Admitted to being hungry.  I was informed by RN later in the day that patient was found sitting on the floor having slid out of her chair.  He had not appeared to hit her head and while she had some temporary tachycardia, this quickly returned back to baseline she was placed on BiPAP temporarily due to increased work of breathing to get her up from the floor and to walk to her bed.  She has doing well after this incident.  She was able to weight-bear and walk without pain.  Assessment & Plan:   Active Problems:   Moderate dementia, with behavioral disturbance ([place])   COPD with acute exacerbation (HCC)   Respiratory failure with hypoxia and hypercapnia (HCC)   Hyperglycemia due to diabetes mellitus (HCC)   GERD (gastroesophageal reflux disease)   HTN (hypertension)   HLD (hyperlipidemia)   Acute respiratory failure with hypoxia (HCC)   Shortness of breath  Acute on chronic respiratory failure secondary to COPD exacerbation.   Patient seems to be improving from previous report. She is off the BiPAP this morning on  4 L O2, apparently is on 2 L at home. Continue duo nebs every 6 hours Continue Solu-Medrol. Procalcitonin undetectable, ceftriaxone discontinued. Patient is on azithromycin for meeting gold criteria.  Nursing concern of dysphagia.   Was some nursing concern about dysphagia and she was reluctant to give her p.o. meds. Patient seen by speech who did not notice any concern for aspiration. Pured diet with thin liquids was recommended.  Type 2 diabetes mellitus.   CBG is under good control on present regimen, resistant SSI. Hold Metformin  Hypertension.   Continue amlodipine  Dementia with behavioral disturbances. Continue home dose of memantine and donepezil and Seroquel. Hold Ativan Patient mental status seems to be improving per previously documented mental status in chart.  GERD. Continue PPI and Maalox as needed.  Objective: Vitals:   05/11/19 1430 05/11/19 1449 05/11/19 1615 05/11/19 1700  BP:  (!) 155/95 (!) 163/86 140/73  Pulse: 87 93 92 94  Resp: 20  19 16   Temp: 98.3 F (36.8 C)     TempSrc:      SpO2: 100%  100% 100%  Weight:      Height:        Intake/Output Summary (Last 24 hours) at 05/11/2019 1851 Last data filed at 05/11/2019 1700 Gross per 24 hour  Intake 425.24 ml  Output 760 ml  Net -334.76 ml   Filed Weights   05/09/19 2157 05/10/19 0200 05/11/19 0430  Weight: 94 kg 93.9 kg 92.1 kg    Examination:  General exam: Patient sitting up in bed in no acute respiratory distress on oxygen, she seems pleased when I tell her she seems to be improving. Respiratory system: No increased work of breathing.  Shallow respirations with decreased air entry likely secondary to decreased inspiratory effort.  She has some expiratory wheezing.   Cardiovascular system: S1 & S2 heard, RRR.  Gastrointestinal system: Soft, nontender, nondistended, bowel sounds positive. Central nervous system: Alert but oriented to self only, able to tell just name, no focal  deficit. Extremities: No edema, no cyanosis, pulses intact and symmetrical.  DVT prophylaxis: Heparin Code Status: Full Family Communication: No family member listed, called listed contact Fritz Pickerel but he was from a group home and states that she no more lives with them. Disposition Plan:  Status is: Inpatient  Remains inpatient appropriate because:Inpatient level of care appropriate due to severity of illness   Dispo: The patient is from: ALF              Anticipated d/c is to: To be determined              Anticipated d/c date is: 2 days              Patient currently is not medically stable to d/c.  Patient still needs intermittent BiPAP due to COPD exacerbation.  Will also need PT/OT evaluation once more stable.  Appears to have advanced dementia.   Consultants:   None  Procedures:  Antimicrobials:  Azithromycin  Data Reviewed: I have personally reviewed following labs and imaging studies  CBC: Recent Labs  Lab 05/09/19 1617 05/09/19 2118 05/10/19 0435 05/11/19 0405  WBC 14.1* 12.1* 9.7 11.2*  NEUTROABS 10.8*  --  9.0*  --   HGB 10.8* 10.7* 11.2* 13.0  HCT 34.6* 33.7* 37.3 42.8  MCV 85.9 84.7 88.2 87.3  PLT 282 279 285 102   Basic Metabolic Panel: Recent Labs  Lab 05/09/19 1617 05/09/19 2118 05/10/19 0435 05/11/19 0405  NA 137  --  139 141  K 3.8  --  4.5 4.3  CL 102  --  103 101  CO2 27  --  25 29  GLUCOSE 191*  --  202* 130*  BUN 19  --  19 18  CREATININE 1.35* 1.19* 1.09* 1.02*  CALCIUM 9.0  --  9.1 9.5  MG  --   --   --  2.2   GFR: Estimated Creatinine Clearance: 47.8 mL/min (A) (by C-G formula based on SCr of 1.02 mg/dL (H)). Liver Function Tests: Recent Labs  Lab 05/09/19 1617 05/10/19 0435  AST 22 18  ALT 16 15  ALKPHOS 82 84  BILITOT 0.6 0.6  PROT 8.1 8.1  ALBUMIN 3.7 3.6   No results for input(s): LIPASE, AMYLASE in the last 168 hours. No results for input(s): AMMONIA in the last 168 hours. Coagulation Profile: No results for  input(s): INR, PROTIME in the last 168 hours. Cardiac Enzymes: No results for input(s): CKTOTAL, CKMB, CKMBINDEX, TROPONINI in the last 168 hours. BNP (last 3 results) No results for input(s): PROBNP in the last 8760 hours. HbA1C: Recent Labs    05/09/19 2118  HGBA1C 6.6*   CBG: Recent Labs  Lab 05/11/19 0010 05/11/19 0359 05/11/19 0705 05/11/19 1115 05/11/19 1552  GLUCAP 128* 113* 121* 91 258*   Lipid Profile: No results for input(s): CHOL, HDL, LDLCALC, TRIG, CHOLHDL, LDLDIRECT in the last 72 hours. Thyroid Function Tests: No results for input(s): TSH, T4TOTAL, FREET4, T3FREE, THYROIDAB in the  last 72 hours. Anemia Panel: No results for input(s): VITAMINB12, FOLATE, FERRITIN, TIBC, IRON, RETICCTPCT in the last 72 hours. Sepsis Labs: Recent Labs  Lab 05/10/19 0849 05/11/19 0405  PROCALCITON <0.10 0.22    Recent Results (from the past 240 hour(s))  Respiratory Panel by RT PCR (Flu A&B, Covid) - Nasopharyngeal Swab     Status: None   Collection Time: 05/09/19  4:16 PM   Specimen: Nasopharyngeal Swab  Result Value Ref Range Status   SARS Coronavirus 2 by RT PCR NEGATIVE NEGATIVE Final    Comment: (NOTE) SARS-CoV-2 target nucleic acids are NOT DETECTED. The SARS-CoV-2 RNA is generally detectable in upper respiratoy specimens during the acute phase of infection. The lowest concentration of SARS-CoV-2 viral copies this assay can detect is 131 copies/mL. A negative result does not preclude SARS-Cov-2 infection and should not be used as the sole basis for treatment or other patient management decisions. A negative result may occur with  improper specimen collection/handling, submission of specimen other than nasopharyngeal swab, presence of viral mutation(s) within the areas targeted by this assay, and inadequate number of viral copies (<131 copies/mL). A negative result must be combined with clinical observations, patient history, and epidemiological information.  The expected result is Negative. Fact Sheet for Patients:  PinkCheek.be Fact Sheet for Healthcare Providers:  GravelBags.it This test is not yet ap proved or cleared by the [place] FDA and  has been authorized for detection and/or diagnosis of SARS-CoV-2 by FDA under an Emergency Use Authorization (EUA). This EUA will remain  in effect (meaning this test can be used) for the duration of the COVID-19 declaration under Section 564(b)(1) of the Act, 21 U.S.C. section 360bbb-3(b)(1), unless the authorization is terminated or revoked sooner.    Influenza A by PCR NEGATIVE NEGATIVE Final   Influenza B by PCR NEGATIVE NEGATIVE Final    Comment: (NOTE) The Xpert Xpress SARS-CoV-2/FLU/RSV assay is intended as an aid in  the diagnosis of influenza from Nasopharyngeal swab specimens and  should not be used as a sole basis for treatment. Nasal washings and  aspirates are unacceptable for Xpert Xpress SARS-CoV-2/FLU/RSV  testing. Fact Sheet for Patients: PinkCheek.be Fact Sheet for Healthcare Providers: GravelBags.it This test is not yet approved or cleared by the [place] FDA and  has been authorized for detection and/or diagnosis of SARS-CoV-2 by  FDA under an Emergency Use Authorization (EUA). This EUA will remain  in effect (meaning this test can be used) for the duration of the  Covid-19 declaration under Section 564(b)(1) of the Act, 21  U.S.C. section 360bbb-3(b)(1), unless the authorization is  terminated or revoked. Performed at [facility], [address] [place], [place] [address]   MRSA PCR Screening     Status: None   Collection Time: 05/09/19  9:52 PM   Specimen: Nasal Mucosa; Nasopharyngeal  Result Value Ref Range Status   MRSA by PCR NEGATIVE NEGATIVE Final    Comment:        The GeneXpert MRSA Assay (FDA approved for NASAL  specimens only), is one component of a comprehensive MRSA colonization surveillance program. It is not intended to diagnose MRSA infection nor to guide or monitor treatment for MRSA infections. Performed at [facility], [address] [place],  [address]      Radiology Studies: No results found.  Scheduled Meds: . amLODipine  10 mg Oral Daily  . aspirin  325 mg Oral Daily  . atorvastatin  20 mg Oral QHS  .  Chlorhexidine Gluconate Cloth  6 each Topical Daily  . memantine  28 mg Oral Daily   And  . donepezil  10 mg Oral QHS  . ferrous sulfate  325 mg Oral BID WC  . fluticasone  2 spray Each Nare Daily  . heparin  5,000 Units Subcutaneous Q8H  . insulin aspart  0-20 Units Subcutaneous Q4H  . insulin aspart  0-5 Units Subcutaneous QHS  . ipratropium-albuterol  3 mL Nebulization Q4H  . lisinopril  10 mg Oral Daily  . methylPREDNISolone (SOLU-MEDROL) injection  40 mg Intravenous BID  . modafinil  100 mg Oral Daily  . mometasone-formoterol  2 puff Inhalation BID  . multivitamin with minerals  1 tablet Oral Daily  . pantoprazole  40 mg Oral Daily  . perphenazine  4 mg Oral TID  . QUEtiapine  300 mg Oral QHS  . Vitamin D (Ergocalciferol)  50,000 Units Oral Q30 days   Continuous Infusions: . azithromycin Stopped (05/11/19 1206)     LOS: 2 days   Time spent: 50 minutes.  Vashti Hey, MD [facility]  If 7PM-7AM, please contact night-coverage [email or web address]  05/11/2019, 6:51 PM   This record has been created using Dragon voice recognition software. Errors have been sought and corrected,but may not always be located. Such creation errors do not reflect on the standard of care.

## 2019-05-11 NOTE — Evaluation (Signed)
Physical Therapy Evaluation Patient Details Name: Cheryl [name] MRN: [id number] DOB: 1940/09/02 Today's Date: 05/11/2019   History of Present Illness  79 y.o. 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. Admitted to ICU on BiPAP.  Clinical Impression  Pt is a pleasant 79 year old female who was admitted for COPD exacerbation with complaints of hypoxia and SOB symptoms. Pt performs bed mobility, transfers, and ambulation with cga and HHA. Pt is confused at baseline and is a poor historian. Pt demonstrates deficits with strength/cognition/endurance. All mobility performed on 4L of O2 with sats at 96% post exertion, however HR elevated into 130s. Further ambulation deferred at this time. No unsteadiness noted, however pt impulsive and has difficulty navigating line management. Would benefit from skilled PT to address above deficits and promote optimal return to PLOF. Recommend transition to [place] upon discharge from acute hospitalization.     Follow Up Recommendations Home health PT    Equipment Recommendations  None recommended by PT    Recommendations for Other Services       Precautions / Restrictions Precautions Precautions: Fall Precaution Comments: watch vitals Restrictions Weight Bearing Restrictions: No      Mobility  Bed Mobility Overal bed mobility: Needs Assistance Bed Mobility: Supine to Sit     Supine to sit: Min guard     General bed mobility comments: slightly impulsive, however able to perform transition relatively well. Able to maintain balance once seated at EOB  Transfers Overall transfer level: Needs assistance Equipment used: 1 person hand held assist Transfers: Sit to/from Stand Sit to Stand: Min guard         General transfer comment: safe technique with upright posture. Good static standing  posture  Ambulation/Gait Ambulation/Gait assistance: Min guard Gait Distance (Feet): 3 Feet Assistive device: 1 person hand held assist Gait Pattern/deviations: Step-to pattern     General Gait Details: ambulated over to recliner with safe technique. Slightly impulsive, trying to sit on IV line. Needs cues for line management. Further distance limited due to vitals  Stairs            Wheelchair Mobility    Modified Rankin (Stroke Patients Only)       Balance Overall balance assessment: Needs assistance Sitting-balance support: Feet supported Sitting balance-Leahy Scale: Good     Standing balance support: Bilateral upper extremity supported Standing balance-Leahy Scale: Good                               Pertinent Vitals/Pain Pain Assessment: No/denies pain    Home Living Family/patient expects to be discharged to:: Assisted living               Home Equipment: None      Prior Function Level of Independence: Independent         Comments: poor historian. Per chart, pt was indep prior to admission without device     Hand Dominance        Extremity/Trunk Assessment   Upper Extremity Assessment Upper Extremity Assessment: Generalized weakness(B UE grossly 4/5)    Lower Extremity Assessment Lower Extremity Assessment: Generalized weakness(B LE grossly 4/5)       Communication   Communication: Expressive difficulties  Cognition Arousal/Alertness: Awake/alert Behavior During Therapy: WFL for tasks assessed/performed Overall Cognitive Status: History of cognitive  impairments - at baseline                                 General Comments: Pt alert, oriented to self, follows simple commands with cues for sequencing      General Comments      Exercises Other Exercises Other Exercises: educated in pursed lip breathing. Assisted pt in beginning lunch. Pt very figidty with line/leads   Assessment/Plan    PT  Assessment Patient needs continued PT services  PT Problem List Decreased strength;Decreased balance;Decreased mobility;Decreased cognition;Decreased safety awareness;Cardiopulmonary status limiting activity       PT Treatment Interventions Gait training;Therapeutic exercise;Balance training    PT Goals (Current goals can be found in the Care Plan section)  Acute Rehab PT Goals Patient Stated Goal: pt did not state PT Goal Formulation: Patient unable to participate in goal setting Time For Goal Achievement: 05/25/19 Potential to Achieve Goals: Good    Frequency Min 2X/week   Barriers to discharge        Co-evaluation               AM-PAC PT "6 Clicks" Mobility  Outcome Measure Help needed turning from your back to your side while in a flat bed without using bedrails?: A Little Help needed moving from lying on your back to sitting on the side of a flat bed without using bedrails?: A Little Help needed moving to and from a bed to a chair (including a wheelchair)?: A Little Help needed standing up from a chair using your arms (e.g., wheelchair or bedside chair)?: A Little Help needed to walk in hospital room?: A Little Help needed climbing 3-5 steps with a railing? : A Lot 6 Click Score: 17    End of Session Equipment Utilized During Treatment: Oxygen Activity Tolerance: Patient tolerated treatment well Patient left: in chair Nurse Communication: Mobility status PT Visit Diagnosis: Muscle weakness (generalized) (M62.81);Difficulty in walking, not elsewhere classified (R26.2)    Time: 8264-1583 PT Time Calculation (min) (ACUTE ONLY): 13 min   Charges:   PT Evaluation $PT Eval Moderate Complexity: 1 [address] [place], DPT [phone]   Arriah Wadle 05/11/2019, 2:34 PM

## 2019-05-11 NOTE — Evaluation (Signed)
Clinical/Bedside Swallow Evaluation Patient Details  Name: Cheryl [name] MRN: [id number] Date of Birth: [birth year]  Today's Date: 05/11/2019 Time: SLP Start Time (ACUTE ONLY): 1003 SLP Stop Time (ACUTE ONLY): 1050 SLP Time Calculation (min) (ACUTE ONLY): 47 min  Past Medical History:  Past Medical History:  Diagnosis Date  . COPD (chronic obstructive pulmonary disease) ([place])   . Dementia ([place])   . Diabetes mellitus without complication ([place])   . GERD (gastroesophageal reflux disease)   . Hyperlipidemia   . Hypertension   . Lung cancer ([place])   . Mental retardation   . Schizophrenia [facility])    Past Surgical History:  Past Surgical History:  Procedure Laterality Date  . ABDOMINAL HYSTERECTOMY     HPI:  Pt is a 79 y.o. 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 Admitted to ICU on BiPAP; remained on BiPAP yesterday, now removed.  CXR: "Congestive heart failure with mild interstitial edema"; a Hiatal Hernia was also noted.    Assessment / Plan / Recommendation Clinical Impression  Pt appears to present w/ grossly adequate oropharyngeal phase swallow function w/ No immediate, oropharyngeal phase dysphagia noted, No neuromuscular deficits noted.Pt consumed po trials w/ No overt, clinical s/s of aspiration during po trials given/monitored. Pt appears at reduced risk for aspiration following general aspiration precautions w/ oral intake. However, during po trials, pt appeared to self-feed somewhat impulsively taking consecutive, large bites/sips. Besides potential to increase risk for aspiration, pt also appeared to have increased respiratory effort and rate w/ consecutive oral intake. Rest Breaks had to be implemented to allow pt to calm her breathing during po intake -- unsure of pt's insight into the risk for aspiration d/t elevated RR. During po  trials, pt consumed thin liquid and puree consistencies w/ no immediate, overt coughing, decline in vocal quality, or decline in O2 sats(96%). The increase in respiratory presentation and rate(28-31pbm) immediately w/ post trials calmed w/ rest breaks. Oral phase appeared [facility] w/ timely bolus management and control of bolus propulsion for A-P transfer for swallowing. Oral clearing achieved w/ all trial consistencies. Trials of Solid foods were not given this eval d/t pt's respiratory presentation at this eval; and baseline Cognitive decline. OM Exam appeared [facility] w/ no unilateral weakness noted. Speech Clear, Echolalia noted. Pt fed self w/ setup support. Recommend a Pureed consistency diet w/ Thin liquidsvia CUP to initiate diet today. Recommend general aspiration precautions to include Rest Breaks when necessary if RR increase note; Pills whole vs crushed in Puree for safer, easier swallowing. Recommend GERD precautions d/t GERD and Hiatal Hernia baseline. ST services will continue to follow up w/ trials to upgrade diet as appropriate. NSG agreed.  SLP Visit Diagnosis: Dysphagia, unspecified (R13.10)    Aspiration Risk  Mild aspiration risk;Risk for inadequate nutrition/hydration(reduced following precautions)    Diet Recommendation  Dysphagia level 1 (puree) w/ Thin liquids; aspiration precautions including Rest Breaks during po intake to avoid SOB/WOB; monitoring and support at meals to reduce Impulsive Self-Feeding. GERD precautions.   Medication Administration: Crushed with puree(vs Whole as able)    Other  Recommendations Recommended Consults: Consider GI evaluation(Dietician f/u) Oral Care Recommendations: Oral care BID;Oral care before and after PO;Staff/trained caregiver to provide oral care Other Recommendations: (n/a)   Follow up Recommendations None(TBD)      Frequency and Duration min 3x week  2 weeks  Prognosis Prognosis for Safe Diet Advancement: Fair Barriers to Reach  Goals: Cognitive deficits;Time post onset;Severity of deficits;Behavior Barriers/Prognosis Comment: impulsive feeding behavior      Swallow Study   General Date of Onset: 05/09/19 HPI: Pt is a 79 y.o. 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 Admitted to ICU on BiPAP; remained on BiPAP yesterday, now removed.  CXR: "Congestive heart failure with mild interstitial edema"; a Hiatal Hernia was also noted.  Type of Study: Bedside Swallow Evaluation Previous Swallow Assessment: none noted Diet Prior to this Study: NPO Temperature Spikes Noted: No(wbc 11.2) Respiratory Status: Nasal cannula(4L) History of Recent Intubation: No Behavior/Cognition: Alert;Cooperative;Pleasant mood;Confused;Distractible;Requires cueing(baseline) Oral Cavity Assessment: (sticky) Oral Care Completed by SLP: Yes Oral Cavity - Dentition: Adequate natural dentition(few missing) Vision: Functional for self-feeding Self-Feeding Abilities: Able to feed self;Needs assist;Needs set up;Total assist(d/t baseline Cognitive issues) Patient Positioning: Upright in bed(needed full support) Baseline Vocal Quality: Normal Volitional Cough: Strong Volitional Swallow: Able to elicit    Oral/Motor/Sensory Function Overall Oral Motor/Sensory Function: Within functional limits   Ice Chips Ice chips: Within functional limits Presentation: Spoon(fed; 3 trials)   Thin Liquid Thin Liquid: Impaired Presentation: Cup;Self Fed(supported; 9 trials) Oral Phase Impairments: (none) Oral Phase Functional Implications: (none) Pharyngeal  Phase Impairments: (min increased RR) Other Comments: monitored for impulsivity w/ self-feeding    Nectar Thick Nectar Thick Liquid: Not tested   Honey Thick Honey Thick Liquid: Not tested   Puree Puree: Impaired Presentation: Spoon;Self Fed(assisted; ~3  ozs) Oral Phase Impairments: (none) Oral Phase Functional Implications: (none) Pharyngeal Phase Impairments: (min increased RR) Other Comments: min impulsive self-feeding   Solid     Solid: Not tested       Orinda Kenner, MS, CCC-SLP Jeremih Dearmas 05/11/2019,4:22 PM

## 2019-05-11 NOTE — Progress Notes (Signed)
Hospitalist called. Found patient sitting on the floor in front of recliner. Patient states that she slid out of recliner. She states that she did not hit her head. During inspection no signs noted. With two person assist she was able to stand and ambulate to bed. Vitals are stable and patient placed back on bi-pap due to labored breathing/excertion. Continue to monitor.

## 2019-05-11 NOTE — Progress Notes (Signed)
Patients neuro assessment has not changed from this morning. Per MD no additional interventions at this time. Notified MD of intermit. [place], back to baseline. Son Remo Lipps called, this RN left message to call back. Continue to assess.

## 2019-05-12 LAB — GLUCOSE, CAPILLARY
Glucose-Capillary: 112 mg/dL — ABNORMAL HIGH (ref 70–99)
Glucose-Capillary: 113 mg/dL — ABNORMAL HIGH (ref 70–99)
Glucose-Capillary: 141 mg/dL — ABNORMAL HIGH (ref 70–99)
Glucose-Capillary: 191 mg/dL — ABNORMAL HIGH (ref 70–99)
Glucose-Capillary: 218 mg/dL — ABNORMAL HIGH (ref 70–99)

## 2019-05-12 LAB — PROCALCITONIN: Procalcitonin: 0.12 ng/mL

## 2019-05-12 NOTE — Care Management Important Message (Signed)
Important Message  Patient Details  Name: Cheryl [name] MRN: [id number] Date of Birth: 1940-03-27   Medicare Important Message Given:  Yes     [name] 05/12/2019, 1:08 PM

## 2019-05-12 NOTE — Progress Notes (Signed)
Pt's HR up to 130s in a-fib and converts back to NSR in 80s. MD notified. No new orders. I will continue to assess.

## 2019-05-12 NOTE — Progress Notes (Signed)
Pt sitting up on 3L nasal cannula sating 98%. Alert to self. Garbled speech. Purposeful and symmetrical movement to all extremities. Tolerating puree diet with thin liquids. Alert to self.   Pt converted to A-fib during breakfast with rate of 100-150 lasting approximately 3-4 minutes, rate now controlled 80-90s a-fib. MD made aware and verbal orders placed for cardiac monitoring to continue. Pt denies pain.

## 2019-05-12 NOTE — Progress Notes (Signed)
Report called to Tammy RN on 2A. Will transport pt via bed.

## 2019-05-12 NOTE — Progress Notes (Signed)
Physical Therapy Treatment Patient Details Name: Cheryl [name] MRN: [id number] DOB: 1940/01/29 Today's Date: 05/12/2019    History of Present Illness 79 y.o. 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 Admitted to ICU on BiPAP.    PT Comments    Pt is making good progress towards goals with ability to ambulate to recliner. All safety measures in place including chair alarm, legs elevated, door open and RN staff notified. All mobility performed on 3L of O2 with sats maintaining at 98%. Good endurance with there-ex, needs cues for sequencing and initiation. Follows commands, however is very confused at baseline.   Follow Up Recommendations  Home health PT     Equipment Recommendations  None recommended by PT    Recommendations for Other Services       Precautions / Restrictions Precautions Precautions: Fall Restrictions Weight Bearing Restrictions: No    Mobility  Bed Mobility Overal bed mobility: Needs Assistance Bed Mobility: Supine to Sit     Supine to sit: Supervision     General bed mobility comments: impulsive, however follows commands. Upright posture noted  Transfers Overall transfer level: Needs assistance Equipment used: 1 person hand held assist Transfers: Sit to/from Stand Sit to Stand: Min guard         General transfer comment: able to stand from low bed. Once standing reaches out for furniture. Gave hand held assist  Ambulation/Gait Ambulation/Gait assistance: Min guard Gait Distance (Feet): 5 Feet Assistive device: 1 person hand held assist Gait Pattern/deviations: Step-to pattern     General Gait Details: ambulated over to recliner due to safety. Impulsive and sits on O2 cord. Vitals remained stable on 3L of O2 at 98%. No SOB symptoms noted   Stairs             Wheelchair Mobility     Modified Rankin (Stroke Patients Only)       Balance Overall balance assessment: Needs assistance Sitting-balance support: Feet supported Sitting balance-Leahy Scale: Good     Standing balance support: Single extremity supported Standing balance-Leahy Scale: Fair                              Cognition Arousal/Alertness: Awake/alert Behavior During Therapy: WFL for tasks assessed/performed Overall Cognitive Status: History of cognitive impairments - at baseline                                 General Comments: oriented to self, but is repeating words frequently and confused to situation      Exercises Other Exercises Other Exercises: supine/seated ther-ex performed on B LE including AP, SLRs, hip abd/add, heel slides, alt marching, and LAQ. ALl ther-ex performed x 12 reps with cues for sequencing. Demonstrates fast technique. Cues for breathing    General Comments        Pertinent Vitals/Pain Pain Assessment: No/denies pain    Home Living                      Prior Function            PT Goals (current goals can now be found in the care plan section) Acute Rehab PT Goals Patient Stated Goal: pt did not state PT Goal Formulation:  Patient unable to participate in goal setting Time For Goal Achievement: 05/25/19 Potential to Achieve Goals: Good Progress towards PT goals: Progressing toward goals    Frequency    Min 2X/week      PT Plan Current plan remains appropriate    Co-evaluation              AM-PAC PT "6 Clicks" Mobility   Outcome Measure  Help needed turning from your back to your side while in a flat bed without using bedrails?: A Little Help needed moving from lying on your back to sitting on the side of a flat bed without using bedrails?: A Little Help needed moving to and from a bed to a chair (including a wheelchair)?: A Little Help needed standing up from a chair using your arms (e.g., wheelchair  or bedside chair)?: A Little Help needed to walk in hospital room?: A Little Help needed climbing 3-5 steps with a railing? : A Lot 6 Click Score: 17    End of Session Equipment Utilized During Treatment: Gait belt;Oxygen Activity Tolerance: Patient tolerated treatment well Patient left: in chair;with chair alarm set(notified CNA that pt in recliner with alarm) Nurse Communication: Mobility status PT Visit Diagnosis: Muscle weakness (generalized) (M62.81);Difficulty in walking, not elsewhere classified (R26.2)     Time: 1448-1856 PT Time Calculation (min) (ACUTE ONLY): 23 min  Charges:  $Gait Training: 8-22 mins $Therapeutic Exercise: 8-22 mins                     Greggory Stallion, PT, DPT [phone]    Cheryl [name] 05/12/2019, 4:43 PM

## 2019-05-12 NOTE — Progress Notes (Signed)
PROGRESS NOTE    Cheryl [name]  DZH:[id number] DOB: May 14, 1940 DOA: 05/09/2019 PCP: Letta Median, MD   Brief Narrative:  Cheryl [name] is a 79 y.o. 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 History obtained from previous documentation as well as [name], patient's care coordinator at [facility] as patient is unable to provide history due to her dementia.  Initially treated with BiPAP, weaned off overnight, placed back to BiPAP as patient started desaturating and becoming tachypneic while working with PT in the morning.  Admitted for COPD exacerbation.  Subjective:  Patient without any complaints today.  She says that she is fine when I asked her.  Does not seem very talkative today.  She denies that anything is bothering her.  Assessment & Plan:   Active Problems:   Moderate dementia, with behavioral disturbance ([place])   COPD with acute exacerbation (HCC)   Respiratory failure with hypoxia and hypercapnia (HCC)   Hyperglycemia due to diabetes mellitus (HCC)   GERD (gastroesophageal reflux disease)   HTN (hypertension)   HLD (hyperlipidemia)   Acute respiratory failure with hypoxia (HCC)   Shortness of breath  Acute on chronic respiratory failure secondary to COPD exacerbation.   Patient is improved during the day, continues to need BiPAP in the evening which she sometimes has a hard time tolerating.  She may be able to go back to her facility with nocturnal BiPAP. Still requiring 4 L of oxygen, is apparently on 2 L at home. Continue duo nebs every 6 hours Continue Solu-Medrol. Procalcitonin undetectable, ceftriaxone discontinued. Patient is on azithromycin for meeting gold criteria.  Nursing concern of dysphagia.   Was some nursing concern about dysphagia and she was reluctant to give her p.o. meds. Patient seen by speech who  did not notice any concern for aspiration. Pured diet with thin liquids was recommended.  Type 2 diabetes mellitus.   CBG is under good control on present regimen, resistant SSI. Hold Metformin  Hypertension.   Continue amlodipine  Dementia with behavioral disturbances. Continue home dose of memantine and donepezil and Seroquel. Hold Ativan Patient mental status seems to be improving per previously documented mental status in chart.  GERD. Continue PPI and Maalox as needed.  Objective: Vitals:   05/12/19 1300 05/12/19 1400 05/12/19 1602 05/12/19 1621  BP:   133/79   Pulse:   86   Resp: (!) 22 20 18    Temp:   97.9 F (36.6 C)   TempSrc:      SpO2:   100% 100%  Weight:      Height:        Intake/Output Summary (Last 24 hours) at 05/12/2019 1738 Last data filed at 05/12/2019 0900 Gross per 24 hour  Intake --  Output 200 ml  Net -200 ml   Filed Weights   05/11/19 2300 05/12/19 0434 05/12/19 1104  Weight: 95.7 kg 95.7 kg 102.1 kg    Examination:  General exam: Patient sitting up in bed in no acute respiratory distress on oxygen, she seems pleased when I tell her she seems to be improving. Respiratory system: No increased work of breathing.  Shallow respirations with decreased air entry likely secondary to decreased inspiratory effort.  She has some expiratory wheezing.   Cardiovascular system: S1 & S2 heard, RRR.  Gastrointestinal system: Soft, nontender, nondistended, bowel sounds positive. Central nervous system: Alert  but oriented to self only, able to tell just name, no focal deficit. Extremities: No edema, no cyanosis, pulses intact and symmetrical.  DVT prophylaxis: Heparin Code Status: Full Family Communication: No family member listed, called listed contact Fritz Pickerel but he was from a group home and states that she no more lives with them. Disposition Plan:  Status is: Inpatient  Remains inpatient appropriate because:Inpatient level of care appropriate due to  severity of illness   Dispo: The patient is from: ALF              Anticipated d/c is to: SNF versus ALF              Anticipated d/c date is: Potentially tomorrow              Patient currently is improving.  Patient still needs intermittent BiPAP due to COPD exacerbation.  Will also need PT/OT evaluation once more stable.  Appears to have advanced dementia.   Consultants:   None  Procedures:  Antimicrobials:  Azithromycin  Data Reviewed: I have personally reviewed following labs and imaging studies  CBC: Recent Labs  Lab 05/09/19 1617 05/09/19 2118 05/10/19 0435 05/11/19 0405  WBC 14.1* 12.1* 9.7 11.2*  NEUTROABS 10.8*  --  9.0*  --   HGB 10.8* 10.7* 11.2* 13.0  HCT 34.6* 33.7* 37.3 42.8  MCV 85.9 84.7 88.2 87.3  PLT 282 279 285 161   Basic Metabolic Panel: Recent Labs  Lab 05/09/19 1617 05/09/19 2118 05/10/19 0435 05/11/19 0405  NA 137  --  139 141  K 3.8  --  4.5 4.3  CL 102  --  103 101  CO2 27  --  25 29  GLUCOSE 191*  --  202* 130*  BUN 19  --  19 18  CREATININE 1.35* 1.19* 1.09* 1.02*  CALCIUM 9.0  --  9.1 9.5  MG  --   --   --  2.2   GFR: Estimated Creatinine Clearance: 50.7 mL/min (A) (by C-G formula based on SCr of 1.02 mg/dL (H)). Liver Function Tests: Recent Labs  Lab 05/09/19 1617 05/10/19 0435  AST 22 18  ALT 16 15  ALKPHOS 82 84  BILITOT 0.6 0.6  PROT 8.1 8.1  ALBUMIN 3.7 3.6   No results for input(s): LIPASE, AMYLASE in the last 168 hours. No results for input(s): AMMONIA in the last 168 hours. Coagulation Profile: No results for input(s): INR, PROTIME in the last 168 hours. Cardiac Enzymes: No results for input(s): CKTOTAL, CKMB, CKMBINDEX, TROPONINI in the last 168 hours. BNP (last 3 results) No results for input(s): PROBNP in the last 8760 hours. HbA1C: Recent Labs    05/09/19 2118  HGBA1C 6.6*   CBG: Recent Labs  Lab 05/12/19 0002 05/12/19 0418 05/12/19 0742 05/12/19 1212 05/12/19 1655  GLUCAP 112* 113* 141*  218* 191*   Lipid Profile: No results for input(s): CHOL, HDL, LDLCALC, TRIG, CHOLHDL, LDLDIRECT in the last 72 hours. Thyroid Function Tests: No results for input(s): TSH, T4TOTAL, FREET4, T3FREE, THYROIDAB in the last 72 hours. Anemia Panel: No results for input(s): VITAMINB12, FOLATE, FERRITIN, TIBC, IRON, RETICCTPCT in the last 72 hours. Sepsis Labs: Recent Labs  Lab 05/10/19 0849 05/11/19 0405 05/12/19 0438  PROCALCITON <0.10 0.22 0.12    Recent Results (from the past 240 hour(s))  Respiratory Panel by RT PCR (Flu A&B, Covid) - Nasopharyngeal Swab     Status: None   Collection Time: 05/09/19  4:16 PM   Specimen: Nasopharyngeal Swab  Result Value Ref Range Status   SARS Coronavirus 2 by RT PCR NEGATIVE NEGATIVE Final    Comment: (NOTE) SARS-CoV-2 target nucleic acids are NOT DETECTED. The SARS-CoV-2 RNA is generally detectable in upper respiratoy specimens during the acute phase of infection. The lowest concentration of SARS-CoV-2 viral copies this assay can detect is 131 copies/mL. A negative result does not preclude SARS-Cov-2 infection and should not be used as the sole basis for treatment or other patient management decisions. A negative result may occur with  improper specimen collection/handling, submission of specimen other than nasopharyngeal swab, presence of viral mutation(s) within the areas targeted by this assay, and inadequate number of viral copies (<131 copies/mL). A negative result must be combined with clinical observations, patient history, and epidemiological information. The expected result is Negative. Fact Sheet for Patients:  PinkCheek.be Fact Sheet for Healthcare Providers:  GravelBags.it This test is not yet ap proved or cleared by the [place] FDA and  has been authorized for detection and/or diagnosis of SARS-CoV-2 by FDA under an Emergency Use Authorization (EUA). This EUA will  remain  in effect (meaning this test can be used) for the duration of the COVID-19 declaration under Section 564(b)(1) of the Act, 21 U.S.C. section 360bbb-3(b)(1), unless the authorization is terminated or revoked sooner.    Influenza A by PCR NEGATIVE NEGATIVE Final   Influenza B by PCR NEGATIVE NEGATIVE Final    Comment: (NOTE) The Xpert Xpress SARS-CoV-2/FLU/RSV assay is intended as an aid in  the diagnosis of influenza from Nasopharyngeal swab specimens and  should not be used as a sole basis for treatment. Nasal washings and  aspirates are unacceptable for Xpert Xpress SARS-CoV-2/FLU/RSV  testing. Fact Sheet for Patients: PinkCheek.be Fact Sheet for Healthcare Providers: GravelBags.it This test is not yet approved or cleared by the [place] FDA and  has been authorized for detection and/or diagnosis of SARS-CoV-2 by  FDA under an Emergency Use Authorization (EUA). This EUA will remain  in effect (meaning this test can be used) for the duration of the  Covid-19 declaration under Section 564(b)(1) of the Act, 21  U.S.C. section 360bbb-3(b)(1), unless the authorization is  terminated or revoked. Performed at [facility] [address] [place], [place] [address]   MRSA PCR Screening     Status: None   Collection Time: 05/09/19  9:52 PM   Specimen: Nasal Mucosa; Nasopharyngeal  Result Value Ref Range Status   MRSA by PCR NEGATIVE NEGATIVE Final    Comment:        The GeneXpert MRSA Assay (FDA approved for NASAL specimens only), is one component of a comprehensive MRSA colonization surveillance program. It is not intended to diagnose MRSA infection nor to guide or monitor treatment for MRSA infections. Performed at [facility], [address], [place], [place] [address]      Radiology Studies: No results found.  Scheduled Meds: . amLODipine  10 mg Oral Daily  . aspirin  325 mg  Oral Daily  . atorvastatin  20 mg Oral QHS  . Chlorhexidine Gluconate Cloth  6 each Topical Daily  . memantine  28 mg Oral Daily   And  . donepezil  10 mg Oral QHS  . ferrous sulfate  325 mg Oral BID WC  . fluticasone  2 spray Each Nare Daily  . heparin  5,000 Units Subcutaneous Q8H  . insulin aspart  0-20 Units Subcutaneous Q4H  . insulin aspart  0-5 Units Subcutaneous QHS  . ipratropium-albuterol  3 mL Nebulization Q4H  . lisinopril  10 mg Oral Daily  . methylPREDNISolone (SOLU-MEDROL) injection  40 mg Intravenous BID  . modafinil  100 mg Oral Daily  . mometasone-formoterol  2 puff Inhalation BID  . multivitamin with minerals  1 tablet Oral Daily  . pantoprazole  40 mg Oral Daily  . perphenazine  4 mg Oral TID  . QUEtiapine  300 mg Oral QHS  . Vitamin D (Ergocalciferol)  50,000 Units Oral Q30 days   Continuous Infusions:    LOS: 3 days   Time spent: 50 minutes.  Vashti Hey, MD [facility]  If 7PM-7AM, please contact night-coverage [email or web address]  05/12/2019, 5:38 PM   This record has been created using Dragon voice recognition software. Errors have been sought and corrected,but may not always be located. Such creation errors do not reflect on the standard of care.

## 2019-05-13 DIAGNOSIS — R000 Tachycardia, unspecified: Secondary | ICD-10-CM

## 2019-05-13 LAB — GLUCOSE, CAPILLARY
Glucose-Capillary: 123 mg/dL — ABNORMAL HIGH (ref 70–99)
Glucose-Capillary: 148 mg/dL — ABNORMAL HIGH (ref 70–99)
Glucose-Capillary: 200 mg/dL — ABNORMAL HIGH (ref 70–99)
Glucose-Capillary: 211 mg/dL — ABNORMAL HIGH (ref 70–99)
Glucose-Capillary: 228 mg/dL — ABNORMAL HIGH (ref 70–99)
Glucose-Capillary: 260 mg/dL — ABNORMAL HIGH (ref 70–99)

## 2019-05-13 LAB — CBC
HCT: 36.5 % (ref 36.0–46.0)
Hemoglobin: 11.4 g/dL — ABNORMAL LOW (ref 12.0–15.0)
MCH: 26.3 pg (ref 26.0–34.0)
MCHC: 31.2 g/dL (ref 30.0–36.0)
MCV: 84.3 fL (ref 80.0–100.0)
Platelets: 341 10*3/uL (ref 150–400)
RBC: 4.33 MIL/uL (ref 3.87–5.11)
RDW: 13.5 % (ref 11.5–15.5)
WBC: 9.1 10*3/uL (ref 4.0–10.5)
nRBC: 0 % (ref 0.0–0.2)

## 2019-05-13 LAB — BASIC METABOLIC PANEL
Anion gap: 7 (ref 5–15)
BUN: 34 mg/dL — ABNORMAL HIGH (ref 8–23)
CO2: 30 mmol/L (ref 22–32)
Calcium: 9.1 mg/dL (ref 8.9–10.3)
Chloride: 101 mmol/L (ref 98–111)
Creatinine, Ser: 1.13 mg/dL — ABNORMAL HIGH (ref 0.44–1.00)
GFR calc Af Amer: 54 mL/min — ABNORMAL LOW (ref >60)
GFR calc non Af Amer: 47 mL/min — ABNORMAL LOW (ref >60)
Glucose, Bld: 217 mg/dL — ABNORMAL HIGH (ref 70–99)
Potassium: 4.5 mmol/L (ref 3.5–5.1)
Sodium: 138 mmol/L (ref 135–145)

## 2019-05-13 MED ORDER — ALBUTEROL SULFATE (2.5 MG/3ML) 0.083% IN NEBU
2.5000 mg | INHALATION_SOLUTION | Freq: Four times a day (QID) | RESPIRATORY_TRACT | Status: DC
  Administered 2019-05-13 – 2019-05-16 (×13): 2.5 mg via RESPIRATORY_TRACT
  Filled 2019-05-13 (×13): qty 3

## 2019-05-13 MED ORDER — PREDNISONE 50 MG PO TABS
60.0000 mg | ORAL_TABLET | Freq: Every day | ORAL | Status: DC
  Administered 2019-05-14 – 2019-05-16 (×3): 60 mg via ORAL
  Filled 2019-05-13 (×3): qty 1

## 2019-05-13 NOTE — Progress Notes (Signed)
PROGRESS NOTE  Cheryl [name] XBM:[id number] DOB: 09/18/1940 DOA: 05/09/2019 PCP: Letta Median, MD  Brief History   Cheryl [name] a 79 y.o.femalewith medical history significant ofCOPD on 2 L, dementia, DM, GERD, HTN, HLD, lung cancer, schizophrenia who presents to [facility] due to low oxygen levels noted by ALF staff at [facility]. History obtained from previous documentation as well as Cheryl [name], patient's care coordinator at [facility] as patient is unable to provide history due to her dementia.  Initially treated with BiPAP, weaned off overnight, placed back to BiPAP as patient started desaturating and becoming tachypneic while working with PT in the morning.  Admitted for COPD exacerbation.  The patient was admitted to a telemetry bed. She was started on steroids, nebulizer treatments, and azithromycin.   Her oxygen requirements are down to baseline with saturations of 96% on room. Air. She has been converted to oral steroids.  I have discussed the patient with a care coordinator from her facility.   Consultants  . None  Procedures  . None  Antibiotics   Anti-infectives (From admission, onward)   Start     Dose/Rate Route Frequency Ordered Stop   05/10/19 1215  azithromycin (ZITHROMAX) 500 mg in sodium chloride 0.9 % 250 mL IVPB     500 mg 250 mL/hr over 60 Minutes Intravenous Daily 05/10/19 1102 05/12/19 2313   05/09/19 2100  cefTRIAXone (ROCEPHIN) 1 g in sodium chloride 0.9 % 100 mL IVPB  Status:  Discontinued     1 g 200 mL/hr over 30 Minutes Intravenous Every 24 hours 05/09/19 2021 05/10/19 1102    .   Subjective  The patient is resting comfortably. No new complaints.  Objective   Vitals:  Vitals:   05/13/19 1459 05/13/19 1630  BP:  128/85  Pulse:  95  Resp:  18  Temp:    SpO2: 96% 95%   Exam:  Constitutional:  . The patient is awake, alert, and oriented x 3. No acute distress. Respiratory:  Marland Kitchen Mildly increased work of breathing with conversation. .  No wheezes, rales, or rhonchi . No tactile fremitus Cardiovascular:  . Regular rate and rhythm . No murmurs, ectopy, or gallups. . No lateral PMI. No thrills. Abdomen:  . Abdomen is soft, non-tender, non-distended . No hernias, masses, or organomegaly . Normoactive bowel sounds.  Musculoskeletal:  . No cyanosis, clubbing, or edema Skin:  . No rashes, lesions, ulcers . palpation of skin: no induration or nodules Neurologic:  . CN 2-12 intact . Sensation all 4 extremities intact Psychiatric:  . Mental status . Pt is awake and alert.   I have personally reviewed the following:   Today's Data  . Vitals, BMP, CBC  Imaging   CXR (05/09/2019)  Cardiology Data  . EKG 05/09/2019 . EKG 05/13/2019  Scheduled Meds: . albuterol  2.5 mg Inhalation Q6H  . amLODipine  10 mg Oral Daily  . aspirin  325 mg Oral Daily  . atorvastatin  20 mg Oral QHS  . Chlorhexidine Gluconate Cloth  6 each Topical Daily  . memantine  28 mg Oral Daily   And  . donepezil  10 mg Oral QHS  . ferrous sulfate  325 mg Oral BID WC  . fluticasone  2 spray Each Nare Daily  . heparin  5,000 Units Subcutaneous Q8H  . insulin aspart  0-20 Units Subcutaneous Q4H  . insulin aspart  0-5 Units Subcutaneous QHS  . lisinopril  10 mg Oral Daily  . modafinil  100 mg Oral Daily  .  mometasone-formoterol  2 puff Inhalation BID  . multivitamin with minerals  1 tablet Oral Daily  . pantoprazole  40 mg Oral Daily  . perphenazine  4 mg Oral TID  . [START ON 05/14/2019] predniSONE  60 mg Oral Q breakfast  . QUEtiapine  300 mg Oral QHS  . Vitamin D (Ergocalciferol)  50,000 Units Oral Q30 days   Continuous Infusions:  Active Problems:   Moderate dementia, with behavioral disturbance (HCC)   COPD with acute exacerbation (HCC)   Respiratory failure with hypoxia and hypercapnia (HCC)   Hyperglycemia due to diabetes mellitus (HCC)   GERD (gastroesophageal reflux disease)   HTN (hypertension)   HLD (hyperlipidemia)   Acute  respiratory failure with hypoxia (HCC)   Shortness of breath   LOS: 4 days   A & P  Acute on chronic respiratory failure secondary to COPD exacerbation: Patient is improved during the day, continues to need BiPAP in the evening which she sometimes has a hard time tolerating.  She may be able to go back to her facility with nocturnal BiPAP. EMR demonstrates sats in the mid nineties on room air, except for 3L recorded at 1300. Nurse [name] tells me that the patient is actually on 3 liters despite what is recorded.  Baseline O2 requirements are 2L.  She is currently saturating at 95% on room air as recorded in EMR. She has been converted to oral steroids. Continue duo nebs every 6 hours. Patient is on azithromycin for meeting gold criteria.  Tachycardia: Sinus tach as per EKG performed this afternoon. The only heart rate recorded in EMR greater than 105 was 148. EKG was performed at this time and EKG demonstrated sinus tachycardia at 105. Remainder of HR recorded were 80's and 90's. Checking another EKG given new information that the patient had a heart rate of 170.   Nursing concern of dysphagia: Was some nursing concern about dysphagia and she was reluctant to give her p.o. meds. Patient seen by speech who did not notice any concern for aspiration. Pured diet with thin liquids was recommended.  Type 2 diabetes mellitus. CBG is under good control on present regimen, resistant SSI. Hold Metformin.  Hypertension: Continue amlodipine.  Dementia with behavioral disturbances: Continue home dose of memantine and donepezil and Seroquel. Hold Ativan. Patient mental status seems to be improving per previously documented mental status in chart.  GERD: Continue PPI and Maalox as needed.  I have seen and examined this patient myself. I have spent 42 minutes in her evaluation and care.  DVT prophylaxis: Heparin Code Status: Full Family Communication: No family member listed, called listed contact  Cheryl [name] but he was from a group home and states that she no more lives with them. Disposition Plan: The patient is from a facility. She will return to a facility at discharge. Barrier to discharge. Resolution of respiratory failure.   Corianna Avallone, DO [facility] Direct contact: see [email or web address]  7PM-7AM contact night coverage as above 05/13/2019, 8:43 PM  LOS: 4 days

## 2019-05-13 NOTE — Progress Notes (Addendum)
Discussed patient's erratic HR's today with Dr. Rockey Situ around shift change. We discussed how her H.R. appears to have gotten as high as 170 with several periods of sustained tachycardia, whether that be A-Fib w/ R.V.R., atrial tachycardia, atrial flutter w/ a R.V.R., etc. Per Dr. Rockey Situ she most likely needs at least one beta-blocker added to her regimen. Several telemetry strips are available to view / print in the system. My request to get a cardiologist on board for the patient earlier today was never answered / acted upon. Dr. Rockey Situ stated that he was willing to accept consultation if asked / ordered by attending physician. I greatly appreciate his help / advice earlier when asked about patient's heart rhythm / rate, when patient wasn't even on his consultation list. Already passed this info along to the night shift RN. Cheryl [name]   ADDENDUM: In fact, I received no such notice of a HR of 170. I have received no calls from Mr. [name] about this patient's heart rate. I received only one request that I discuss her with a nurse from her facility. I have been referring to the vitals which are recorded in the EMR. They demonstrate only one HR of 148 that is greater than 105. Most of the heart rates available to me demonstrate heart rates in the 80's and nineties. I would have welcomed the kind of information that was shared with Dr. Rockey Situ, and I would have been happy to have consulted him, if I had been made aware. EKG was checked when I saw the HR of 148. It demonstrated only HR of 105 which was sinus tachcycardia with PAC's. I will repeat the EKG now.

## 2019-05-13 NOTE — Plan of Care (Signed)
  Problem: Education: Goal: Knowledge of General Education information will improve Description: Including pain rating scale, medication(s)/side effects and non-pharmacologic comfort measures Outcome: Progressing   Problem: Health Behavior/Discharge Planning: Goal: Ability to manage health-related needs will improve Outcome: Progressing   Problem: Clinical Measurements: Goal: Ability to maintain clinical measurements within normal limits will improve Outcome: Not Progressing Note: BUN is elevated at 34. Will continue to monitor renal labs for the remainder of the shift. Wenda Low [facility]

## 2019-05-13 NOTE — Progress Notes (Signed)
Pt taken off bipap and placed on 3L nasal cannula.

## 2019-05-14 ENCOUNTER — Encounter: Payer: Self-pay | Admitting: Internal Medicine

## 2019-05-14 LAB — CBC
HCT: 39.2 % (ref 36.0–46.0)
Hemoglobin: 12 g/dL (ref 12.0–15.0)
MCH: 26.5 pg (ref 26.0–34.0)
MCHC: 30.6 g/dL (ref 30.0–36.0)
MCV: 86.7 fL (ref 80.0–100.0)
Platelets: 369 10*3/uL (ref 150–400)
RBC: 4.52 MIL/uL (ref 3.87–5.11)
RDW: 13.6 % (ref 11.5–15.5)
WBC: 12.4 10*3/uL — ABNORMAL HIGH (ref 4.0–10.5)
nRBC: 0.2 % (ref 0.0–0.2)

## 2019-05-14 LAB — BASIC METABOLIC PANEL
Anion gap: 6 (ref 5–15)
BUN: 41 mg/dL — ABNORMAL HIGH (ref 8–23)
CO2: 33 mmol/L — ABNORMAL HIGH (ref 22–32)
Calcium: 9.2 mg/dL (ref 8.9–10.3)
Chloride: 101 mmol/L (ref 98–111)
Creatinine, Ser: 1.24 mg/dL — ABNORMAL HIGH (ref 0.44–1.00)
GFR calc Af Amer: 49 mL/min — ABNORMAL LOW (ref >60)
GFR calc non Af Amer: 42 mL/min — ABNORMAL LOW (ref >60)
Glucose, Bld: 104 mg/dL — ABNORMAL HIGH (ref 70–99)
Potassium: 4.1 mmol/L (ref 3.5–5.1)
Sodium: 140 mmol/L (ref 135–145)

## 2019-05-14 LAB — GLUCOSE, CAPILLARY
Glucose-Capillary: 53 mg/dL — ABNORMAL LOW (ref 70–99)
Glucose-Capillary: 92 mg/dL (ref 70–99)
Glucose-Capillary: 212 mg/dL — ABNORMAL HIGH (ref 70–99)
Glucose-Capillary: 241 mg/dL — ABNORMAL HIGH (ref 70–99)
Glucose-Capillary: 280 mg/dL — ABNORMAL HIGH (ref 70–99)

## 2019-05-14 LAB — MAGNESIUM: Magnesium: 2.2 mg/dL (ref 1.7–2.4)

## 2019-05-14 MED ORDER — METOPROLOL TARTRATE 25 MG PO TABS
25.0000 mg | ORAL_TABLET | Freq: Two times a day (BID) | ORAL | Status: DC
  Filled 2019-05-14: qty 1

## 2019-05-14 MED ORDER — METOPROLOL TARTRATE 25 MG PO TABS
25.0000 mg | ORAL_TABLET | Freq: Two times a day (BID) | ORAL | Status: DC
  Administered 2019-05-14 – 2019-05-16 (×5): 25 mg via ORAL
  Filled 2019-05-14 (×4): qty 1

## 2019-05-14 NOTE — Plan of Care (Signed)
Congestion and audible wheeze auscultated.  DB&C encouraged. Maintaining SpO2 >90% on RA.   Problem: Clinical Measurements: Goal: Ability to maintain clinical measurements within normal limits will improve Outcome: Progressing Goal: Will remain free from infection Outcome: Progressing Goal: Diagnostic test results will improve Outcome: Progressing Goal: Respiratory complications will improve Outcome: Progressing Goal: Cardiovascular complication will be avoided Outcome: Progressing   Problem: Nutrition: Goal: Adequate nutrition will be maintained Outcome: Progressing   Problem: Coping: Goal: Level of anxiety will decrease Outcome: Progressing   Problem: Elimination: Goal: Will not experience complications related to bowel motility Outcome: Progressing Goal: Will not experience complications related to urinary retention Outcome: Progressing   Problem: Pain Managment: Goal: General experience of comfort will improve Outcome: Progressing   Problem: Safety: Goal: Ability to remain free from injury will improve Outcome: Progressing   Problem: Respiratory: Goal: Ability to maintain a clear airway will improve Outcome: Progressing Goal: Levels of oxygenation will improve Outcome: Progressing Goal: Ability to maintain adequate ventilation will improve Outcome: Progressing

## 2019-05-14 NOTE — Progress Notes (Addendum)
PROGRESS NOTE  Cheryl [name] KNL:[id number] DOB: 06-28-1940 DOA: 05/09/2019 PCP: Letta Median, MD  Brief History   Cheryl [name] a 79 y.o.femalewith medical history significant ofCOPD on 2 L, dementia, DM, GERD, HTN, HLD, lung cancer, schizophrenia who presents to [facility] due to low oxygen levels noted by ALF staff at [facility]. History obtained from previous documentation as well as Cheryl [name], patient's care coordinator at [employer] as patient is unable to provide history due to her dementia.  Initially treated with BiPAP, weaned off overnight, placed back to BiPAP as patient started desaturating and becoming tachypneic while working with PT in the morning.  Admitted for COPD exacerbation.  The patient was admitted to a telemetry bed. She was started on steroids, nebulizer treatments, and azithromycin.   Her oxygen requirements are down to baseline with saturations of 97% on room. Air. She has been converted to oral steroids.  I have discussed the patient with a care coordinator from her facility.   Consultants  . None  Procedures  . None  Antibiotics   Anti-infectives (From admission, onward)   Start     Dose/Rate Route Frequency Ordered Stop   05/10/19 1215  azithromycin (ZITHROMAX) 500 mg in sodium chloride 0.9 % 250 mL IVPB     500 mg 250 mL/hr over 60 Minutes Intravenous Daily 05/10/19 1102 05/12/19 2313   05/09/19 2100  cefTRIAXone (ROCEPHIN) 1 g in sodium chloride 0.9 % 100 mL IVPB  Status:  Discontinued     1 g 200 mL/hr over 30 Minutes Intravenous Every 24 hours 05/09/19 2021 05/10/19 1102      Subjective  The patient is resting comfortably. No new complaints. The patient was hypoglycemic this morning with FSBS 53.  Objective   Vitals:  Vitals:   05/14/19 0814 05/14/19 1153  BP: (!) 142/83 135/85  Pulse: 89 77  Resp: 20 17  Temp:  97.6 F (36.4 C)  SpO2: 99% 97%   Exam:  Constitutional:  . The patient is awake, alert, and oriented x 3. No acute  distress. Respiratory:  Marland Kitchen Mildly increased work of breathing with conversation. . No wheezes, rales, or rhonchi . No tactile fremitus Cardiovascular:  . Regular rate and rhythm . No murmurs, ectopy, or gallups. . No lateral PMI. No thrills. Abdomen:  . Abdomen is soft, non-tender, non-distended . No hernias, masses, or organomegaly . Normoactive bowel sounds.  Musculoskeletal:  . No cyanosis, clubbing, or edema Skin:  . No rashes, lesions, ulcers . palpation of skin: no induration or nodules Neurologic:  . CN 2-12 intact . Sensation all 4 extremities intact Psychiatric:  . Mental status . Pt is awake and alert.   I have personally reviewed the following:   Today's Data  . Vitals, BMP, CBC  Imaging   CXR (05/09/2019)  Cardiology Data  . EKG 05/09/2019 . EKG 05/13/2019 . EKG 05/14/2019  Scheduled Meds: . albuterol  2.5 mg Inhalation Q6H  . amLODipine  10 mg Oral Daily  . aspirin  325 mg Oral Daily  . atorvastatin  20 mg Oral QHS  . Chlorhexidine Gluconate Cloth  6 each Topical Daily  . memantine  28 mg Oral Daily   And  . donepezil  10 mg Oral QHS  . ferrous sulfate  325 mg Oral BID WC  . fluticasone  2 spray Each Nare Daily  . heparin  5,000 Units Subcutaneous Q8H  . insulin aspart  0-20 Units Subcutaneous Q4H  . insulin aspart  0-5 Units Subcutaneous QHS  .  lisinopril  10 mg Oral Daily  . metoprolol tartrate  25 mg Oral BID  . modafinil  100 mg Oral Daily  . mometasone-formoterol  2 puff Inhalation BID  . multivitamin with minerals  1 tablet Oral Daily  . pantoprazole  40 mg Oral Daily  . perphenazine  4 mg Oral TID  . predniSONE  60 mg Oral Q breakfast  . QUEtiapine  300 mg Oral QHS  . Vitamin D (Ergocalciferol)  50,000 Units Oral Q30 days   Continuous Infusions:  Active Problems:   Moderate dementia, with behavioral disturbance (HCC)   COPD with acute exacerbation (HCC)   Respiratory failure with hypoxia and hypercapnia (HCC)   Hyperglycemia due to  diabetes mellitus (HCC)   GERD (gastroesophageal reflux disease)   HTN (hypertension)   HLD (hyperlipidemia)   Acute respiratory failure with hypoxia (HCC)   Shortness of breath   LOS: 5 days   A & P  Acute on chronic respiratory failure secondary to COPD exacerbation: Patient is improved during the day, continues to need BiPAP in the evening which she sometimes has a hard time tolerating.  She may be able to go back to her facility with nocturnal BiPAP. EMR demonstrates sats in the mid nineties on room air, except for 3L recorded at 1300. Nurse Cheryl [name] tells me that the patient is actually on 3 liters despite what is recorded.  Baseline O2 requirements are 2L.  She is currently saturating at 96% on room air as recorded in EMR. She has been converted to oral steroids. Continue duo nebs every 6 hours. Patient is on azithromycin for meeting gold criteria.  Tachycardia: Sinus tach as per EKG performed this afternoon. The only heart rate recorded in EMR greater than 105 was 148. EKG was performed at this time and EKG demonstrated sinus tachycardia at 105. Remainder of HR recorded were 80's and 90's. A second EKG was checked on 05/13/2019 given new information that the patient had a heart rate of 170. It also demonstrated sinus rhythm. No cardiology consult required for sinus rhythm or sinus tachycardia. Continue to monitor for significant arrhythmias.  Nursing concern of dysphagia: Was some nursing concern about dysphagia and she was reluctant to give her p.o. meds. Patient seen by speech who did not notice any concern for aspiration. Pured diet with thin liquids was recommended.  Type 2 diabetes mellitus. CBG is under good control on present regimen, resistant SSI. Hold Metformin.  Hypertension: Blood pressures have been a little high. Metoprolol 25 mg bid was added to amlodipine daily.  Dementia with behavioral disturbances: Continue home dose of memantine and donepezil and Seroquel. Hold  Ativan. Patient mental status seems to be improving per previously documented mental status in chart.  GERD: Continue PPI and Maalox as needed.  Debility: PT/OT to eval and treat.  I have seen and examined this patient myself. I have spent 34 minutes in her evaluation and care.  DVT prophylaxis: Heparin Code Status: Full Family Communication: "Cheryl [name]" from the patient's facility called for an update on patient on 4/31/2021. I answered her questions as possible.  Disposition Plan: The patient is from a facility. She will return to a facility at discharge. Barrier to discharge. Spurious tachycardia. Pt started on beta blocker. Need to monitor for tolerance.  Cheryl Fouty, DO [facility] Direct contact: see [email or web address]  7PM-7AM contact night coverage as above 05/14/2019, 1:08 PM  LOS: 4 days

## 2019-05-14 NOTE — Progress Notes (Signed)
NSR with pvc's and pac's during night with bipap. When removed from bipap this am,  increased ectopy noted including questiionable Afib with PVC's and PAC's with increased rate. RA SAT 100 percent. Skin warm dry pink. Repeat Ekg completed for rhythm change.

## 2019-05-14 NOTE — Progress Notes (Signed)
Occupational Therapy Treatment Patient Details Name: Cheryl [name] MRN: [id number] DOB: 1940-03-17 Today's Date: 05/14/2019    History of present illness 79 y.o. female with medical history significant of COPD on 2 L, dementia, DM, GERD, HTN, HLD, lung cancer, schizophrenia who presents to [facility] due to low oxygen levels noted by ALF staff at [facility]. Admitted to ICU on BiPAP.   OT comments  Pt seen for OT session for increasing independence in ADLs.  She was able to assist with changing her gown with set up and min guard assist and wash hands and face after set up only.  She has very limited functional speech and tends to repeat words over and over but did well with following yes/no questions with 75% accuracy. Worked on deep breathing exercises and bed mobility and needed rest breaks and mod cues to complete with rest breaks for SOB while on room air.  Continue to rec SNF or if she returns to ALF, then [facility] OT.   Follow Up Recommendations  SNF    Equipment Recommendations  3 in 1 bedside commode    Recommendations for Other Services      Precautions / Restrictions Precautions Precautions: Fall Restrictions Weight Bearing Restrictions: No       Mobility Bed Mobility                  Transfers                      Balance                                           ADL either performed or assessed with clinical judgement   ADL Overall ADL's : Needs assistance/impaired     Grooming: Wash/dry face;Set up;Bed level;Cueing for sequencing Grooming Details (indicate cue type and reason): rest breaks needed for SOB         Upper Body Dressing : Set up;Min guard;Bed level Upper Body Dressing Details (indicate cue type and reason): min guard to change gown after spilling a lot of items from lunch on her chest. Has very garbled speech and difficult to understand but able to follow yes/no questions 75% of time.                          Vision Patient Visual Report: No change from baseline     Perception     Praxis      Cognition Arousal/Alertness: Awake/alert Behavior During Therapy: WFL for tasks assessed/performed Overall Cognitive Status: History of cognitive impairments - at baseline                                 General Comments: oriented to self, but is repeating words frequently and confused to situation; garbled speech and labored and congestion with SOB        Exercises     Shoulder Instructions       General Comments      Pertinent Vitals/ Pain          Home Living Family/patient expects to be discharged to:: Assisted living  Prior Functioning/Environment              Frequency  Min 1X/week        Progress Toward Goals  OT Goals(current goals can now be found in the care plan section)        Plan      Co-evaluation                 AM-PAC OT "6 Clicks" Daily Activity     Outcome Measure   Help from another person eating meals?: None Help from another person taking care of personal grooming?: A Little Help from another person toileting, which includes using toliet, bedpan, or urinal?: A Lot Help from another person bathing (including washing, rinsing, drying)?: A Lot Help from another person to put on and taking off regular upper body clothing?: A Little Help from another person to put on and taking off regular lower body clothing?: A Lot 6 Click Score: 16    End of Session    OT Visit Diagnosis: Other abnormalities of gait and mobility (R26.89);Muscle weakness (generalized) (M62.81)   Activity Tolerance Patient tolerated treatment well   Patient Left in bed;with call bell/phone within reach;with bed alarm set   Nurse Communication          Time: 7579-7282 OT Time Calculation (min): 28 min  Charges: OT General Charges $OT Visit: 1 Visit OT Treatments $Self Care/Home  Management : 23-37 mins  Chrys Racer, OTR/L, [place] ascom [phone] 05/14/19, 2:43 PM

## 2019-05-15 LAB — BASIC METABOLIC PANEL
Anion gap: 9 (ref 5–15)
BUN: 35 mg/dL — ABNORMAL HIGH (ref 8–23)
CO2: 31 mmol/L (ref 22–32)
Calcium: 8.9 mg/dL (ref 8.9–10.3)
Chloride: 99 mmol/L (ref 98–111)
Creatinine, Ser: 1.14 mg/dL — ABNORMAL HIGH (ref 0.44–1.00)
GFR calc Af Amer: 54 mL/min — ABNORMAL LOW (ref >60)
GFR calc non Af Amer: 46 mL/min — ABNORMAL LOW (ref >60)
Glucose, Bld: 97 mg/dL (ref 70–99)
Potassium: 4 mmol/L (ref 3.5–5.1)
Sodium: 139 mmol/L (ref 135–145)

## 2019-05-15 LAB — GLUCOSE, CAPILLARY
Glucose-Capillary: 171 mg/dL — ABNORMAL HIGH (ref 70–99)
Glucose-Capillary: 176 mg/dL — ABNORMAL HIGH (ref 70–99)
Glucose-Capillary: 182 mg/dL — ABNORMAL HIGH (ref 70–99)
Glucose-Capillary: 299 mg/dL — ABNORMAL HIGH (ref 70–99)
Glucose-Capillary: 326 mg/dL — ABNORMAL HIGH (ref 70–99)
Glucose-Capillary: 79 mg/dL (ref 70–99)
Glucose-Capillary: 99 mg/dL (ref 70–99)

## 2019-05-15 MED ORDER — ALBUTEROL SULFATE (2.5 MG/3ML) 0.083% IN NEBU: 2.5000 mg | INHALATION_SOLUTION | Freq: Four times a day (QID) | RESPIRATORY_TRACT | 12 refills | Status: DC

## 2019-05-15 MED ORDER — QUETIAPINE FUMARATE 300 MG PO TABS: 300.0000 mg | ORAL_TABLET | Freq: Every day | ORAL | 0 refills | Status: DC

## 2019-05-15 MED ORDER — INSULIN GLARGINE 100 UNIT/ML ~~LOC~~ SOLN
10.0000 [IU] | Freq: Every day | SUBCUTANEOUS | Status: DC
  Administered 2019-05-15: 23:00:00 10 [IU] via SUBCUTANEOUS
  Administered 2019-05-16: 09:00:00 5 [IU] via SUBCUTANEOUS
  Filled 2019-05-15 (×3): qty 0.1

## 2019-05-15 MED ORDER — METOPROLOL TARTRATE 25 MG PO TABS: 25.0000 mg | ORAL_TABLET | Freq: Two times a day (BID) | ORAL | 0 refills | Status: DC

## 2019-05-15 MED ORDER — MODAFINIL 100 MG PO TABS: 100.0000 mg | ORAL_TABLET | Freq: Every day | ORAL | 0 refills | Status: AC

## 2019-05-15 MED ORDER — ALPRAZOLAM 0.25 MG PO TABS: 0.2500 mg | ORAL_TABLET | Freq: Two times a day (BID) | ORAL | 0 refills | Status: DC

## 2019-05-15 MED ORDER — PREDNISONE 20 MG PO TABS: ORAL_TABLET | ORAL | 0 refills | Status: AC

## 2019-05-15 MED ORDER — NAMZARIC 28-10 MG PO CP24: 1.0000 | ORAL_CAPSULE | Freq: Every day | ORAL | 0 refills | Status: DC

## 2019-05-15 MED ORDER — PERPHENAZINE 4 MG PO TABS: 4.0000 mg | ORAL_TABLET | Freq: Three times a day (TID) | ORAL | 0 refills | Status: DC

## 2019-05-15 NOTE — Discharge Summary (Signed)
Physician Discharge Summary  Cheryl [name] JGO:[id number] DOB: 1940/07/17 DOA: 05/09/2019  PCP: Letta Median, MD  Admit date: 05/09/2019 Discharge date: 05/15/2019  Recommendations for Outpatient Follow-up:  1. Check Glucoses daily and if there is concern for hypoglycemia. Report glucoses greater than 300 or less than 70 to physician on call.  2. Follow up with PCP in 7-10 days. 3. Have chemistry checked on visit with PCP.  Follow-up Information    [name], Cheryl Cal, MD Follow up.   Specialty: Family Medicine Why: Please schedule PCP appointment within 5-7 days of discharge date. Contact information: [place] [address] [phone]          Discharge Diagnoses: Principal diagnosis is #1 1. Acute on chronic hypoxic respiratory failure 2. COPD exacerbation 3. DM II 4. Hypertension 5. Dementia 6. GERD  Discharge Condition: Fair  Disposition: SNF  Diet recommendation: Heart healthy, carbohydrate modified  Filed Weights   05/13/19 0419 05/14/19 0435 05/15/19 0433  Weight: 94.4 kg 95 kg 93.9 kg   History of present illness:  Cheryl [name] is a 79 y.o. female with medical history significant of COPD, dementia, DM, GERD, HTN, HLD, lung cancer, schizophrenia who presents to [facility] due to low oxygen levels noted by ALF staff at [facility]. History obtained from previous documentation as well as Cheryl [name], patient's care coordinator at [employer] as patient is unable to provide history due to her dementia. She was at her ALF when she had complained of shortness of breath this morning. She was given her inhaler with initial improvement in her dyspnea however this afternoon dyspnea worsened again with her oxygen levels dropping to 86-87% per [place]. Due to this she was brought to ED via EMS. EMS also gave her nebulizers with some improvement reported. ROS unable to be obtained although ED reported complaint of pleuritic chest pain on arrival although  she denied this when specifically asked during my questioning.  ED Course:  In the ED, she was noted to be hypoxic to 84% on room air and was initially placed on nasal cannula with improvement in oxygen saturation however she continued to have increased work up breathing. She underwent CXR revealing possible edema however then receiving CTA chest given tachycardia and tachypnea that was negative for PE although was positive for emphysematous changes. Due to increased work up breathing she was placed on BiPAP with an FiO2 stable at 40% with oxygen saturation of 97-100% while I was in the room. She reported feeling improved and comfortable however was unable to tell me the year, her location, or the president.  Of note, she was able to come off of BiPAP during questioning without any significant respiratory distress however was placed back on BiPAP after questioning. Cheryl [name] does report that she uses CPAP at bedtime at her ALF.   She is admitted to [facility] with inpatient status for hypoxic respiratory failure secondary to COPD exacerbation.  Hospital Course:  Cheryl [name] a 79 y.o.femalewith medical history significant ofCOPD on 2 L, dementia, DM, GERD, HTN, HLD, lung cancer, schizophrenia who presents to [facility] due to low oxygen levels noted by ALF staff at [facility]. History obtained from previous documentation as well as Cheryl [name], patient's care coordinator at [employer] as patient is unable to provide history due to her dementia. Initially treated with BiPAP, weaned off overnight, placed back to BiPAP as patient started desaturating and becoming tachypneic while working with PT in the morning. Admitted for COPD exacerbation.  The patient was admitted to a telemetry  bed. She was started on steroids, nebulizer treatments, and azithromycin.   Her oxygen requirements are down to baseline with saturations of 97% on room. Air. She has been converted to oral steroids.  I have discussed the patient  with a care coordinator from her facility.   She will be discharged back to her facility in fair condition.  Today's assessment: S: The patient is resting comfortably. No new complaints. O: Vitals:  Vitals:   05/15/19 1116 05/15/19 1510  BP: 139/82 (!) 147/59  Pulse: 68 75  Resp: 17 17  Temp: 98.3 F (36.8 C) 97.9 F (36.6 C)  SpO2: 97% 100%   Exam:  Constitutional:   The patient is awake, alert, and oriented x 3. No acute distress. Respiratory:   Mildly increased work of breathing with conversation.  No wheezes, rales, or rhonchi  No tactile fremitus Cardiovascular:   Regular rate and rhythm  No murmurs, ectopy, or gallups.  No lateral PMI. No thrills. Abdomen:   Abdomen is soft, non-tender, non-distended  No hernias, masses, or organomegaly  Normoactive bowel sounds.  Musculoskeletal:   No cyanosis, clubbing, or edema Skin:   No rashes, lesions, ulcers  palpation of skin: no induration or nodules Neurologic:   CN 2-12 intact  Sensation all 4 extremities intact Psychiatric:   Mental status  Pt is awake and alert.  Discharge Instructions  Discharge Instructions    Activity as tolerated - No restrictions   Complete by: As directed    Call MD for:  difficulty breathing, headache or visual disturbances   Complete by: As directed    Call MD for:  extreme fatigue   Complete by: As directed    Call MD for:  persistant dizziness or light-headedness   Complete by: As directed    Diet - low sodium heart healthy   Complete by: As directed    Discharge instructions   Complete by: As directed    Check Glucoses daily and if there is concern for hypoglycemia. Report glucoses greater than 300 or less than 70 to physician on call.  Follow up with PCP in 7-10 days. Have chemistry checked on visit with PCP.   Increase activity slowly   Complete by: As directed      Allergies as of 05/15/2019   No Known Allergies     Medication List    STOP  taking these medications   albuterol 108 (90 Base) MCG/ACT inhaler Commonly known as: VENTOLIN HFA Replaced by: albuterol (2.5 MG/3ML) 0.083% nebulizer solution   bismuth subsalicylate 774 JO/87OM suspension Commonly known as: PEPTO BISMOL     TAKE these medications   acetaminophen 325 MG tablet Commonly known as: TYLENOL Take 650 mg by mouth every 4 (four) hours as needed for mild pain or fever.   Advair Diskus 250-50 MCG/DOSE Aepb Generic drug: Fluticasone-Salmeterol Inhale 1 puff into the lungs 2 (two) times daily.   albuterol (2.5 MG/3ML) 0.083% nebulizer solution Commonly known as: PROVENTIL Inhale 3 mLs (2.5 mg total) into the lungs every 6 (six) hours. Replaces: albuterol 108 (90 Base) MCG/ACT inhaler   ALPRAZolam 0.25 MG tablet Commonly known as: XANAX Take 1 tablet (0.25 mg total) by mouth 2 (two) times daily.   aluminum-magnesium hydroxide-simethicone 767-209-47 MG/5ML Susp Commonly known as: MAALOX Take 30 mLs by mouth daily as needed (indegestion or heartburn).   amLODipine 10 MG tablet Commonly known as: NORVASC Take 10 mg by mouth daily.   aspirin 325 MG tablet Take 325 mg by mouth daily.  atorvastatin 20 MG tablet Commonly known as: LIPITOR Take 20 mg by mouth at bedtime.   [employer] Tussin Cough/Cold CF 5-10-100 MG/5ML Liqd Generic drug: Phenylephrine-DM-GG Take 10 mLs by mouth every 6 (six) hours as needed (cough).   ferrous sulfate 325 (65 FE) MG tablet Take 325 mg by mouth 2 (two) times daily with a meal.   fluticasone 50 MCG/ACT nasal spray Commonly known as: FLONASE Place 2 sprays into both nostrils daily.   Glucophage 500 MG tablet Generic drug: metFORMIN Take 500 mg by mouth 2 (two) times daily with a meal.   lisinopril 10 MG tablet Commonly known as: ZESTRIL Take 10 mg by mouth daily.   loperamide 2 MG capsule Commonly known as: IMODIUM Take 2 mg by mouth as needed for diarrhea or loose stools. (max 4 doses in 12 hours)   magnesium  hydroxide 400 MG/5ML suspension Commonly known as: MILK OF MAGNESIA Take 30 mLs by mouth daily as needed for moderate constipation.   metoprolol tartrate 25 MG tablet Commonly known as: LOPRESSOR Take 1 tablet (25 mg total) by mouth 2 (two) times daily.   modafinil 100 MG tablet Commonly known as: PROVIGIL Take 1 tablet (100 mg total) by mouth daily.   Multi-Vitamins Tabs Take 1 tablet by mouth daily.   Namzaric 28-10 MG Cp24 Generic drug: Memantine HCl-Donepezil HCl Take 1 capsule by mouth daily.   omeprazole 40 MG capsule Commonly known as: PRILOSEC Take 40 mg by mouth daily.   perphenazine 4 MG tablet Commonly known as: TRILAFON Take 1 tablet (4 mg total) by mouth 3 (three) times daily.   predniSONE 20 MG tablet Commonly known as: DELTASONE Take 3 tablets (60 mg total) by mouth daily with breakfast for 3 days, THEN 2.5 tablets (50 mg total) daily with breakfast for 3 days, THEN 2 tablets (40 mg total) daily with breakfast for 3 days, THEN 1.5 tablets (30 mg total) daily with breakfast for 3 days, THEN 1 tablet (20 mg total) daily with breakfast for 3 days, THEN 0.5 tablets (10 mg total) daily with breakfast for 3 days. Start taking on: May 16, 2019   QUEtiapine 300 MG tablet Commonly known as: SEROQUEL Take 1 tablet (300 mg total) by mouth at bedtime.   sodium phosphate 7-19 GM/118ML Enem Place 1 enema rectally daily as needed for severe constipation.   Vitamin D (Ergocalciferol) 1.25 MG (50000 UNIT) Caps capsule Commonly known as: DRISDOL Take 50,000 Units by mouth every 30 (thirty) days.      No Known Allergies  The results of significant diagnostics from this hospitalization (including imaging, microbiology, ancillary and laboratory) are listed below for reference.    Significant Diagnostic Studies: CT Angio Chest PE W and/or Wo Contrast  Result Date: 05/09/2019 CLINICAL DATA:  Shortness of breath and wheezing EXAM: CT ANGIOGRAPHY CHEST WITH CONTRAST  TECHNIQUE: Multidetector CT imaging of the chest was performed using the standard protocol during bolus administration of intravenous contrast. Multiplanar CT image reconstructions and MIPs were obtained to evaluate the vascular anatomy. CONTRAST:  36mL OMNIPAQUE IOHEXOL 350 MG/ML SOLN COMPARISON:  10/13/2017 FINDINGS: Cardiovascular: Thoracic aorta demonstrates atherosclerotic calcifications without aneurysmal dilatation or dissection. No significant cardiac enlargement is noted. The pulmonary artery is mildly prominent centrally although no filling defect to suggest pulmonary embolism is seen. Coronary calcifications are noted. Mediastinum/Nodes: The thoracic inlet is within normal limits. Scattered small mediastinal lymph nodes are noted. The esophagus as visualized is within normal limits. Sliding-type hiatal hernia is noted. Lungs/Pleura: 6 mm  nodule is noted in the right upper lobe best seen on image number 29 of series 6 and image number 31 of series 9. This is new from the prior exam. Diffuse emphysematous changes are identified. There are changes consistent with post radiation therapy and scarring emanating from the left hilum superiorly as well as into the lower lobe. These changes are mildly progressed from the prior exam and consistent with increased scarring. No new focal parenchymal nodule on the left is noted. Upper Abdomen: Hypodensity is noted within the liver similar to that seen on prior exam likely representing cyst or hemangioma. Previously seen hypodensity within the medial segment of the left lobe of the liver is noted as well. Fatty infiltration of the liver is seen. The remainder of the upper abdomen is within normal limits. Musculoskeletal: Degenerative changes of the thoracic spine are noted. No acute bony abnormality is seen. Review of the MIP images confirms the above findings. IMPRESSION: No evidence of pulmonary emboli. Changes of prior radiation therapy on the left with increased  scarring when compared with the prior exam. Hiatal hernia. Chronic changes in the upper abdomen as described. Aortic Atherosclerosis (ICD10-I70.0) and Emphysema (ICD10-J43.9). Electronically Signed   By: Inez Catalina M.D.   On: 05/09/2019 18:39   DG Chest Portable 1 View  Result Date: 05/09/2019 CLINICAL DATA:  Short of breath rule out edema EXAM: PORTABLE CHEST 1 VIEW COMPARISON:  01/08/2016 FINDINGS: Heart size upper normal. Vascular congestion with bilateral interstitial edema. No significant pleural effusion. Atherosclerotic aortic arch. Advanced degenerative changes in the shoulders bilaterally. IMPRESSION: Congestive heart failure with mild interstitial edema. Electronically Signed   By: Franchot Gallo M.D.   On: 05/09/2019 16:33    Microbiology: Recent Results (from the past 240 hour(s))  Respiratory Panel by RT PCR (Flu A&B, Covid) - Nasopharyngeal Swab     Status: None   Collection Time: 05/09/19  4:16 PM   Specimen: Nasopharyngeal Swab  Result Value Ref Range Status   SARS Coronavirus 2 by RT PCR NEGATIVE NEGATIVE Final    Comment: (NOTE) SARS-CoV-2 target nucleic acids are NOT DETECTED. The SARS-CoV-2 RNA is generally detectable in upper respiratoy specimens during the acute phase of infection. The lowest concentration of SARS-CoV-2 viral copies this assay can detect is 131 copies/mL. A negative result does not preclude SARS-Cov-2 infection and should not be used as the sole basis for treatment or other patient management decisions. A negative result may occur with  improper specimen collection/handling, submission of specimen other than nasopharyngeal swab, presence of viral mutation(s) within the areas targeted by this assay, and inadequate number of viral copies (<131 copies/mL). A negative result must be combined with clinical observations, patient history, and epidemiological information. The expected result is Negative. Fact Sheet for Patients:    PinkCheek.be Fact Sheet for Healthcare Providers:  GravelBags.it This test is not yet ap proved or cleared by the [place] FDA and  has been authorized for detection and/or diagnosis of SARS-CoV-2 by FDA under an Emergency Use Authorization (EUA). This EUA will remain  in effect (meaning this test can be used) for the duration of the COVID-19 declaration under Section 564(b)(1) of the Act, 21 U.S.C. section 360bbb-3(b)(1), unless the authorization is terminated or revoked sooner.    Influenza A by PCR NEGATIVE NEGATIVE Final   Influenza B by PCR NEGATIVE NEGATIVE Final    Comment: (NOTE) The Xpert Xpress SARS-CoV-2/FLU/RSV assay is intended as an aid in  the diagnosis of influenza from Nasopharyngeal swab  specimens and  should not be used as a sole basis for treatment. Nasal washings and  aspirates are unacceptable for Xpert Xpress SARS-CoV-2/FLU/RSV  testing. Fact Sheet for Patients: PinkCheek.be Fact Sheet for Healthcare Providers: GravelBags.it This test is not yet approved or cleared by the [place] FDA and  has been authorized for detection and/or diagnosis of SARS-CoV-2 by  FDA under an Emergency Use Authorization (EUA). This EUA will remain  in effect (meaning this test can be used) for the duration of the  Covid-19 declaration under Section 564(b)(1) of the Act, 21  U.S.C. section 360bbb-3(b)(1), unless the authorization is  terminated or revoked. Performed at [facility], [address] [place], [place] [address]   MRSA PCR Screening     Status: None   Collection Time: 05/09/19  9:52 PM   Specimen: Nasal Mucosa; Nasopharyngeal  Result Value Ref Range Status   MRSA by PCR NEGATIVE NEGATIVE Final    Comment:        The GeneXpert MRSA Assay (FDA approved for NASAL specimens only), is one component of a comprehensive MRSA  colonization surveillance program. It is not intended to diagnose MRSA infection nor to guide or monitor treatment for MRSA infections. Performed at [facility], [address] [place], [place] [address]      Labs: Basic Metabolic Panel: Recent Labs  Lab 05/10/19 0435 05/11/19 0405 05/13/19 0441 05/14/19 0258 05/15/19 0632  NA 139 141 138 140 139  K 4.5 4.3 4.5 4.1 4.0  CL 103 101 101 101 99  CO2 25 29 30  33* 31  GLUCOSE 202* 130* 217* 104* 97  BUN 19 18 34* 41* 35*  CREATININE 1.09* 1.02* 1.13* 1.24* 1.14*  CALCIUM 9.1 9.5 9.1 9.2 8.9  MG  --  2.2  --  2.2  --    Liver Function Tests: Recent Labs  Lab 05/09/19 1617 05/10/19 0435  AST 22 18  ALT 16 15  ALKPHOS 82 84  BILITOT 0.6 0.6  PROT 8.1 8.1  ALBUMIN 3.7 3.6   No results for input(s): LIPASE, AMYLASE in the last 168 hours. No results for input(s): AMMONIA in the last 168 hours. CBC: Recent Labs  Lab 05/09/19 1617 05/09/19 1617 05/09/19 2118 05/10/19 0435 05/11/19 0405 05/13/19 0441 05/14/19 0258  WBC 14.1*   < > 12.1* 9.7 11.2* 9.1 12.4*  NEUTROABS 10.8*  --   --  9.0*  --   --   --   HGB 10.8*   < > 10.7* 11.2* 13.0 11.4* 12.0  HCT 34.6*   < > 33.7* 37.3 42.8 36.5 39.2  MCV 85.9   < > 84.7 88.2 87.3 84.3 86.7  PLT 282   < > 279 285 370 341 369   < > = values in this interval not displayed.   Cardiac Enzymes: No results for input(s): CKTOTAL, CKMB, CKMBINDEX, TROPONINI in the last 168 hours. BNP: BNP (last 3 results) Recent Labs    05/09/19 1617  BNP 224.0*    ProBNP (last 3 results) No results for input(s): PROBNP in the last 8760 hours.  CBG: Recent Labs  Lab 05/14/19 2043 05/15/19 0000 05/15/19 0430 05/15/19 0745 05/15/19 1139  GLUCAP 280* 171* 79 99 182*    Active Problems:   Moderate dementia, with behavioral disturbance (HCC)   COPD with acute exacerbation (HCC)   Respiratory failure with hypoxia and hypercapnia (HCC)   Hyperglycemia due to diabetes  mellitus (HCC)   GERD (gastroesophageal reflux disease)  HTN (hypertension)   HLD (hyperlipidemia)   Acute respiratory failure with hypoxia (HCC)   Shortness of breath   Time coordinating discharge: 38 minutes.  Signed:        Ixel Boehning, DO [facility]  05/15/2019, 3:24 PM

## 2019-05-15 NOTE — Progress Notes (Signed)
Physical Therapy Treatment Patient Details Name: Cheryl [name] MRN: [id number] DOB: 10/26/1940 Today's Date: 05/15/2019    History of Present Illness 79 y.o. female with medical history significant of COPD on 2 L, dementia, DM, GERD, HTN, HLD, lung cancer, schizophrenia who presents to [facility] due to low oxygen levels noted by ALF staff at [facility]. Admitted to ICU on BiPAP.    PT Comments    Awake, ready for session.  Participated in exercises as described below.  Pt needs constant verbal and tactile dues to complete ex's.  To EOB with min a x 1.  She reaches and pulls up on my arm despite cues to push and assist herself.  Once sitting, generally steady.  She is able to stand with min assist and transfer to recliner at bedside.  RW is given but it is not used by pt.  She does have some knee buckling but is able to transfer safely to chair.  Remained up with alarm set and pads in place.  Sats stable on room air with mobility.    Pt may benefit from a wheelchair for mobility in ALF if she does not have one already.    Patient suffers from COPD and dementia which impairs his/her ability to perform daily activities like toileting, feeding, dressing, grooming, bathing in the home. A cane, walker, crutch will not resolve the patient's issue with performing activities of daily living. A lightweight wheelchair and cushion is required/recommended and will allow patient to safely perform daily activities.   Patient can safely propel the wheelchair in the home or has a caregiver who can provide assistance.     Follow Up Recommendations  Home health PT     Equipment Recommendations  Wheelchair if she does not have one or access to one at ALF already.   Recommendations for Other Services       Precautions / Restrictions Precautions Precautions: Fall Restrictions Weight Bearing Restrictions: No    Mobility  Bed Mobility Overal bed mobility: Needs Assistance Bed Mobility: Supine to Sit      Supine to sit: Min assist     General bed mobility comments: reaches for may hand to pull up on despite cues for hand placements  Transfers Overall transfer level: Needs assistance Equipment used: 1 person hand held assist Transfers: Sit to/from Stand Sit to Stand: Min guard         General transfer comment: able to stand from low bed. Once standing reaches out for furniture. Gave hand held assist, does not use RW when given to her.  Ambulation/Gait Ambulation/Gait assistance: Min guard Gait Distance (Feet): 3 Feet Assistive device: 1 person hand held assist Gait Pattern/deviations: Step-to pattern Gait velocity: decreased   General Gait Details: ambulated to recliner but further gait limited due to safety.  She has some knee buckling but is able to transfer safely to chair.  Sats stable with mobility on room air.   Stairs             Wheelchair Mobility    Modified Rankin (Stroke Patients Only)       Balance Overall balance assessment: Needs assistance Sitting-balance support: Feet supported Sitting balance-Leahy Scale: Good     Standing balance support: Single extremity supported Standing balance-Leahy Scale: Fair                              Cognition Arousal/Alertness: Awake/alert Behavior During Therapy: WFL for tasks assessed/performed Overall Cognitive  Status: History of cognitive impairments - at baseline                                 General Comments: oriented to self, but is repeating words frequently and confused to situation; garbled speech and labored and congestion with SOB      Exercises Other Exercises Other Exercises: supine AAROM for SLR, heel slides, ab/add x 10 with tactile and verbal cues to continue with ex.    General Comments        Pertinent Vitals/Pain Pain Assessment: No/denies pain    Home Living                      Prior Function            PT Goals (current goals can now  be found in the care plan section) Progress towards PT goals: Progressing toward goals    Frequency    Min 2X/week      PT Plan Current plan remains appropriate    Co-evaluation              AM-PAC PT "6 Clicks" Mobility   Outcome Measure  Help needed turning from your back to your side while in a flat bed without using bedrails?: A Little Help needed moving from lying on your back to sitting on the side of a flat bed without using bedrails?: A Little Help needed moving to and from a bed to a chair (including a wheelchair)?: A Little Help needed standing up from a chair using your arms (e.g., wheelchair or bedside chair)?: A Little Help needed to walk in hospital room?: A Little Help needed climbing 3-5 steps with a railing? : A Lot 6 Click Score: 17    End of Session Equipment Utilized During Treatment: Gait belt Activity Tolerance: Patient tolerated treatment well Patient left: in chair;with chair alarm set Nurse Communication: Mobility status       Time: 1517-6160 PT Time Calculation (min) (ACUTE ONLY): 9 min  Charges:  $Therapeutic Activity: 8-22 mins                    Chesley Noon, PTA 05/15/19, 9:05 AM

## 2019-05-15 NOTE — Progress Notes (Addendum)
5/2:Pt was being discharged, SW contacted [facility] for possible return, spoke with Etter Sjogren [phone]). Tammy stated Pt could not return Sunday evening due to no one being available to accept/assist Pt. Tammy stated Pt can return Monday 5/3, but will need FL2, d/c summary. SW notified care team  Doctor will need to sign Fl2 before discharge

## 2019-05-15 NOTE — Progress Notes (Addendum)
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 History obtained from previous documentation as well as [name], patient's care coordinator at [name] as patient is unable to provide history due to her dementia.  Initially treated with [name], weaned off overnight, placed back to [name] as patient started desaturating and becoming tachypneic while working with [name] in the morning.  Admitted for [name] exacerbation.  The patient was admitted to a telemetry bed. She was started on steroids, nebulizer treatments, and azithromycin.   Her oxygen requirements are down to baseline with saturations of 97% on room. Air. She has been converted to oral steroids.  I have discussed the patient with a care coordinator from her facility. I anticipate her discharge to this facility when the facility is able to accept her back.   Consultants  . None  Procedures  . None  Antibiotics   Anti-infectives (From admission, onward)   Start     Dose/Rate Route Frequency Ordered Stop   05/10/19 1215  azithromycin (ZITHROMAX) 500 mg in sodium chloride 0.9 % 250 mL IVPB     500 mg 250 mL/hr over 60 Minutes Intravenous Daily 05/10/19 1102 05/12/19 2313   05/09/19 2100  cefTRIAXone (ROCEPHIN) 1 g in sodium chloride 0.9 % 100 mL IVPB  Status:  Discontinued     1 g 200 mL/hr over 30 Minutes Intravenous Every 24 hours 05/09/19 2021 05/10/19 1102      Subjective  The patient is resting comfortably. No new complaints.   Objective   Vitals:  Vitals:   05/15/19 1116 05/15/19 1510  BP: 139/82 (!) 147/59  Pulse: 68 75  Resp: 17 17  Temp: 98.3 F (36.8 C) 97.9 F (36.6 C)  SpO2: 97% 100%   Exam:  Constitutional:  . The  patient is awake, alert, and oriented x 3. No acute distress. Respiratory:  Marland Kitchen Mildly increased work of breathing with conversation. . No wheezes, rales, or rhonchi . No tactile fremitus Cardiovascular:  . Regular rate and rhythm . No murmurs, ectopy, or gallups. . No lateral PMI. No thrills. Abdomen:  . Abdomen is soft, non-tender, non-distended . No hernias, masses, or organomegaly . Normoactive bowel sounds.  Musculoskeletal:  . No cyanosis, clubbing, or edema Skin:  . No rashes, lesions, ulcers . palpation of skin: no induration or nodules Neurologic:  . CN 2-12 intact . Sensation all 4 extremities intact Psychiatric:  . Mental status . [name] is awake and alert.   I have personally reviewed the following:   Today's Data  . Vitals, BMP  Imaging   CXR (05/09/2019)  Cardiology Data  . EKG 05/09/2019 . EKG 05/13/2019 . EKG 05/14/2019  Scheduled Meds: . albuterol  2.5 mg Inhalation Q6H  . amLODipine  10 mg Oral Daily  . aspirin  325 mg Oral Daily  . atorvastatin  20 mg Oral QHS  . memantine  28 mg Oral Daily   And  . donepezil  10 mg Oral QHS  . ferrous sulfate  325 mg Oral BID WC  . fluticasone  2 spray Each Nare Daily  . heparin  5,000 Units Subcutaneous Q8H  . insulin aspart  0-20 Units Subcutaneous Q4H  . insulin aspart  0-5 Units Subcutaneous QHS  .  lisinopril  10 mg Oral Daily  . metoprolol tartrate  25 mg Oral BID  . modafinil  100 mg Oral Daily  . mometasone-formoterol  2 puff Inhalation BID  . multivitamin with minerals  1 tablet Oral Daily  . pantoprazole  40 mg Oral Daily  . perphenazine  4 mg Oral TID  . predniSONE  60 mg Oral Q breakfast  . QUEtiapine  300 mg Oral QHS  . Vitamin D (Ergocalciferol)  50,000 Units Oral Q30 days   Continuous Infusions:  Active Problems:   Moderate dementia, with behavioral disturbance ([name])   [name] with acute exacerbation ([name])   Respiratory failure with hypoxia and hypercapnia ([name])   Hyperglycemia due to diabetes  mellitus ([name])   GERD (gastroesophageal reflux disease)   HTN (hypertension)   HLD (hyperlipidemia)   Acute respiratory failure with hypoxia ([name])   Shortness of breath   LOS: 6 days   A & P  Acute on chronic respiratory failure secondary to [name] exacerbation: Patient is improved during the day, continues to need [name] in the evening which she sometimes has a hard time tolerating.  She may be able to go back to her facility with nocturnal [name]. [name] demonstrates sats in the mid nineties on room air, except for 3L recorded at 1300. Nurse [name] tells me that the patient is actually on 3 liters despite what is recorded.  Baseline O2 requirements are 2L.  She is currently saturating at 96% on room air. She has been converted to oral steroids. Continue duo nebs every 6 hours. Patient is on azithromycin for meeting gold criteria.  Tachycardia: Sinus tach as per EKG performed this afternoon. The only heart rate recorded in [name] greater than 105 was 148. EKG was performed at this time and EKG demonstrated sinus tachycardia at 105. Remainder of HR recorded were 80's and 90's. A second EKG was checked on 05/13/2019 given new information that the patient had a heart rate of 170. It also demonstrated sinus rhythm. No cardiology consult required for sinus rhythm or sinus tachycardia. Continue to monitor for significant arrhythmias.  Nursing concern of dysphagia: Was some nursing concern about dysphagia and she was reluctant to give her p.o. meds. Patient seen by speech who did not notice any concern for aspiration. Pured diet with thin liquids was recommended.  Type 2 diabetes mellitus. CBG is under good control on present regimen, resistant SSI. Hold Metformin. Glucoses quite elevated today. Likely this is due to steroids. I have added lantus 10 units daily to her. This will need to be tapered down after discharge as prednisone is tapered down after discharge.  Hypertension: Blood pressures have been a  little high. Metoprolol 25 mg bid was added to amlodipine daily.  Dementia with behavioral disturbances: Continue home dose of memantine and donepezil and Seroquel. Hold Ativan. Patient mental status seems to be improving per previously documented mental status in chart.  GERD: Continue PPI and Maalox as needed.  Debility: [name]/OT to eval and treat.  I have seen and examined this patient myself. I have spent 34 minutes in her evaluation and care.  DVT prophylaxis: Heparin Code Status: Full Family Communication: "Tammy" from the patient's facility called for an update on patient on 4/31/2021. I answered her questions as possible.  Disposition Plan: The patient is from a facility. She will return to a facility at discharge. Barrier to discharge: Facility was unable to accept the patient today. She is medically cleared for discharge.  Cheryl Bown, DO [facility] Direct contact: see [email or web address]  7PM-7AM contact night coverage as above 05/15/2019, 5:00 PM  LOS: 4 days

## 2019-05-16 LAB — GLUCOSE, CAPILLARY
Glucose-Capillary: 69 mg/dL — ABNORMAL LOW (ref 70–99)
Glucose-Capillary: 76 mg/dL (ref 70–99)
Glucose-Capillary: 77 mg/dL (ref 70–99)
Glucose-Capillary: 243 mg/dL — ABNORMAL HIGH (ref 70–99)

## 2019-05-16 LAB — RESPIRATORY PANEL BY RT PCR (FLU A&B, COVID)
Influenza A by PCR: NEGATIVE
Influenza B by PCR: NEGATIVE
SARS Coronavirus 2 by RT PCR: NEGATIVE

## 2019-05-16 MED ORDER — INSULIN GLARGINE 100 UNIT/ML ~~LOC~~ SOLN: 10.0000 [IU] | Freq: Every day | SUBCUTANEOUS | 11 refills | Status: DC

## 2019-05-16 NOTE — Progress Notes (Addendum)
Inpatient Diabetes Program Recommendations  AACE/ADA: New Consensus Statement on Inpatient Glycemic Control   Target Ranges:  Prepandial:   less than 140 mg/dL      Peak postprandial:   less than 180 mg/dL (1-2 hours)      Critically ill patients:  140 - 180 mg/dL   Results for [name], [name] (MRN [id number]) as of 05/16/2019 08:53  Ref. Range 05/15/2019 07:45 05/15/2019 11:39 05/15/2019 16:43 05/15/2019 19:29 05/15/2019 23:12 05/16/2019 04:08 05/16/2019 04:28 05/16/2019 07:27  Glucose-Capillary Latest Ref Range: 70 - 99 mg/dL 99 182 (H) 299 (H) 326 (H) 176 (H) 69 (L) 76 77  Results for [name], [name] (MRN [id number]) as of 05/16/2019 08:53  Ref. Range 05/09/2019 21:18  Hemoglobin A1C Latest Ref Range: 4.8 - 5.6 % 6.6 (H)   Review of Glycemic Control  Diabetes history: DM2 Outpatient Diabetes medications: Metformin 500 mg BID Current orders for Inpatient glycemic control: Lantus 10 units daily, Novolog 0-20 units TID with meals, Novolog 0-5 units QHS; Prednisone 60 mg QAM  Inpatient Diabetes Program Recommendations:   Insulin-Basal: Noted glucose of 69 mg/dl today at 4:08 and 77 mg/dl at 7:27 am today. Please consider decreasing Lantus to 5 units daily if steroids continued. However, if steroids are discontinued, please discontinue Lantus.  Thanks, Barnie Alderman, RN, MSN, CDE Diabetes Coordinator Inpatient Diabetes Program [phone] (Team Pager from 8am to 5pm)

## 2019-05-16 NOTE — Discharge Summary (Signed)
Physician Discharge Summary  Remedy Corporan GUR:[id number] DOB: 11/10/1940 DOA: 05/09/2019  PCP: Letta Median, MD  Admit date: 05/09/2019 Discharge date: 05/16/2019  Recommendations for Outpatient Follow-up:  1. Check Glucoses daily and if there is concern for hypoglycemia. Report glucoses greater than 300 or less than 70 to physician on call.  2. Follow up with PCP in 7-10 days. 3. Have chemistry checked on visit with PCP.   Contact information for follow-up providers    Bender, Durene Cal, MD Follow up.   Specialty: Family Medicine Why: Please schedule PCP appointment within 5-7 days of discharge date. Contact information: [place] [address] [phone]            Contact information for after-discharge care    [facility] .   Service: Skilled Nursing Contact information: [place] [phone]                 Discharge Diagnoses: Principal diagnosis is #1 1. Acute on chronic hypoxic respiratory failure 2. COPD exacerbation 3. DM II 4. Hypertension 5. Dementia 6. GERD  Discharge Condition: Fair  Disposition: SNF  Diet recommendation: Heart healthy, carbohydrate modified  Filed Weights   05/14/19 0435 05/15/19 0433 05/16/19 0531  Weight: 95 kg 93.9 kg 92.2 kg   History of present illness:  Cheryl [name] is a 79 y.o. female with medical history significant of COPD, dementia, DM, GERD, HTN, HLD, lung cancer, schizophrenia who presents to [facility] due to low oxygen levels noted by ALF staff at [facility]. History obtained from previous documentation as well as [name], patient's care coordinator at [employer] as patient is unable to provide history due to her dementia. She was at her ALF when she had complained of shortness of breath this morning. She was given her inhaler with initial improvement in her dyspnea however this afternoon dyspnea  worsened again with her oxygen levels dropping to 86-87% per [place]. Due to this she was brought to ED via EMS. EMS also gave her nebulizers with some improvement reported. ROS unable to be obtained although ED reported complaint of pleuritic chest pain on arrival although she denied this when specifically asked during my questioning.  ED Course:  In the ED, she was noted to be hypoxic to 84% on room air and was initially placed on nasal cannula with improvement in oxygen saturation however she continued to have increased work up breathing. She underwent CXR revealing possible edema however then receiving CTA chest given tachycardia and tachypnea that was negative for PE although was positive for emphysematous changes. Due to increased work up breathing she was placed on BiPAP with an FiO2 stable at 40% with oxygen saturation of 97-100% while I was in the room. She reported feeling improved and comfortable however was unable to tell me the year, her location, or the president.  Of note, she was able to come off of BiPAP during questioning without any significant respiratory distress however was placed back on BiPAP after questioning. [name] does report that she uses CPAP at bedtime at her ALF.   She is admitted to [facility] with inpatient status for hypoxic respiratory failure secondary to COPD exacerbation.  Hospital Course:  Cheryl [name] a 79 y.o.femalewith medical history significant ofCOPD on 2 L, dementia, DM, GERD, HTN, HLD, lung cancer, schizophrenia who presents to [facility] due to low oxygen levels noted by ALF staff at [facility]. History obtained from previous documentation as well  as [name], patient's care coordinator at [facility] as patient is unable to provide history due to her dementia. Initially treated with BiPAP, weaned off overnight, placed back to BiPAP as patient started desaturating and becoming tachypneic while working with PT in the morning. Admitted for COPD  exacerbation.  The patient was admitted to a telemetry bed. She was started on steroids, nebulizer treatments, and azithromycin.   Her oxygen requirements are down to baseline with saturations of 97% on room. Air. She has been converted to oral steroids.  I have discussed the patient with a care coordinator from her facility.   She will be discharged back to her facility in fair condition.  Today's assessment: S: The patient is resting comfortably. No new complaints. O: Vitals:  Vitals:   05/16/19 0531 05/16/19 0728  BP: (!) 126/108 133/61  Pulse: (!) 103 68  Resp:  19  Temp: 98.3 F (36.8 C) 98.4 F (36.9 C)  SpO2: 100% 98%   Exam:  Constitutional:   The patient is awake, alert, and oriented x 3. No acute distress. Respiratory:   Mildly increased work of breathing with conversation.  No wheezes, rales, or rhonchi  No tactile fremitus Cardiovascular:   Regular rate and rhythm  No murmurs, ectopy, or gallups.  No lateral PMI. No thrills. Abdomen:   Abdomen is soft, non-tender, non-distended  No hernias, masses, or organomegaly  Normoactive bowel sounds.  Musculoskeletal:   No cyanosis, clubbing, or edema Skin:   No rashes, lesions, ulcers  palpation of skin: no induration or nodules Neurologic:   CN 2-12 intact  Sensation all 4 extremities intact Psychiatric:   Mental status  Pt is awake and alert.  Discharge Instructions  Discharge Instructions    Activity as tolerated - No restrictions   Complete by: As directed    Call MD for:  difficulty breathing, headache or visual disturbances   Complete by: As directed    Call MD for:  extreme fatigue   Complete by: As directed    Call MD for:  persistant dizziness or light-headedness   Complete by: As directed    Diet - low sodium heart healthy   Complete by: As directed    Discharge instructions   Complete by: As directed    Check Glucoses daily and if there is concern for hypoglycemia.  Report glucoses greater than 300 or less than 70 to physician on call.  Follow up with PCP in 7-10 days. Have chemistry checked on visit with PCP.   Increase activity slowly   Complete by: As directed      Allergies as of 05/16/2019   No Known Allergies     Medication List    STOP taking these medications   albuterol 108 (90 Base) MCG/ACT inhaler Commonly known as: VENTOLIN HFA Replaced by: albuterol (2.5 MG/3ML) 0.083% nebulizer solution   bismuth subsalicylate 176 HY/07PX suspension Commonly known as: PEPTO BISMOL     TAKE these medications   acetaminophen 325 MG tablet Commonly known as: TYLENOL Take 650 mg by mouth every 4 (four) hours as needed for mild pain or fever.   Advair Diskus 250-50 MCG/DOSE Aepb Generic drug: Fluticasone-Salmeterol Inhale 1 puff into the lungs 2 (two) times daily.   albuterol (2.5 MG/3ML) 0.083% nebulizer solution Commonly known as: PROVENTIL Inhale 3 mLs (2.5 mg total) into the lungs every 6 (six) hours. Replaces: albuterol 108 (90 Base) MCG/ACT inhaler   ALPRAZolam 0.25 MG tablet Commonly known as: XANAX Take 1 tablet (0.25 mg total)  by mouth 2 (two) times daily.   aluminum-magnesium hydroxide-simethicone 665-993-57 MG/5ML Susp Commonly known as: MAALOX Take 30 mLs by mouth daily as needed (indegestion or heartburn).   amLODipine 10 MG tablet Commonly known as: NORVASC Take 10 mg by mouth daily.   aspirin 325 MG tablet Take 325 mg by mouth daily.   atorvastatin 20 MG tablet Commonly known as: LIPITOR Take 20 mg by mouth at bedtime.   [employer] Tussin Cough/Cold CF 5-10-100 MG/5ML Liqd Generic drug: Phenylephrine-DM-GG Take 10 mLs by mouth every 6 (six) hours as needed (cough).   ferrous sulfate 325 (65 FE) MG tablet Take 325 mg by mouth 2 (two) times daily with a meal.   fluticasone 50 MCG/ACT nasal spray Commonly known as: FLONASE Place 2 sprays into both nostrils daily.   Glucophage 500 MG tablet Generic drug:  metFORMIN Take 500 mg by mouth 2 (two) times daily with a meal.   insulin glargine 100 UNIT/ML injection Commonly known as: LANTUS Inject 0.1 mLs (10 Units total) into the skin daily. Start taking on: May 17, 2019   lisinopril 10 MG tablet Commonly known as: ZESTRIL Take 10 mg by mouth daily.   loperamide 2 MG capsule Commonly known as: IMODIUM Take 2 mg by mouth as needed for diarrhea or loose stools. (max 4 doses in 12 hours)   magnesium hydroxide 400 MG/5ML suspension Commonly known as: MILK OF MAGNESIA Take 30 mLs by mouth daily as needed for moderate constipation.   metoprolol tartrate 25 MG tablet Commonly known as: LOPRESSOR Take 1 tablet (25 mg total) by mouth 2 (two) times daily.   modafinil 100 MG tablet Commonly known as: PROVIGIL Take 1 tablet (100 mg total) by mouth daily.   Multi-Vitamins Tabs Take 1 tablet by mouth daily.   Namzaric 28-10 MG Cp24 Generic drug: Memantine HCl-Donepezil HCl Take 1 capsule by mouth daily.   omeprazole 40 MG capsule Commonly known as: PRILOSEC Take 40 mg by mouth daily.   perphenazine 4 MG tablet Commonly known as: TRILAFON Take 1 tablet (4 mg total) by mouth 3 (three) times daily.   predniSONE 20 MG tablet Commonly known as: DELTASONE Take 3 tablets (60 mg total) by mouth daily with breakfast for 3 days, THEN 2.5 tablets (50 mg total) daily with breakfast for 3 days, THEN 2 tablets (40 mg total) daily with breakfast for 3 days, THEN 1.5 tablets (30 mg total) daily with breakfast for 3 days, THEN 1 tablet (20 mg total) daily with breakfast for 3 days, THEN 0.5 tablets (10 mg total) daily with breakfast for 3 days. Start taking on: May 16, 2019   QUEtiapine 300 MG tablet Commonly known as: SEROQUEL Take 1 tablet (300 mg total) by mouth at bedtime.   sodium phosphate 7-19 GM/118ML Enem Place 1 enema rectally daily as needed for severe constipation.   Vitamin D (Ergocalciferol) 1.25 MG (50000 UNIT) Caps capsule Commonly  known as: DRISDOL Take 50,000 Units by mouth every 30 (thirty) days.      No Known Allergies  The results of significant diagnostics from this hospitalization (including imaging, microbiology, ancillary and laboratory) are listed below for reference.    Significant Diagnostic Studies: CT Angio Chest PE W and/or Wo Contrast  Result Date: 05/09/2019 CLINICAL DATA:  Shortness of breath and wheezing EXAM: CT ANGIOGRAPHY CHEST WITH CONTRAST TECHNIQUE: Multidetector CT imaging of the chest was performed using the standard protocol during bolus administration of intravenous contrast. Multiplanar CT image reconstructions and MIPs were obtained to  evaluate the vascular anatomy. CONTRAST:  75mL OMNIPAQUE IOHEXOL 350 MG/ML SOLN COMPARISON:  10/13/2017 FINDINGS: Cardiovascular: Thoracic aorta demonstrates atherosclerotic calcifications without aneurysmal dilatation or dissection. No significant cardiac enlargement is noted. The pulmonary artery is mildly prominent centrally although no filling defect to suggest pulmonary embolism is seen. Coronary calcifications are noted. Mediastinum/Nodes: The thoracic inlet is within normal limits. Scattered small mediastinal lymph nodes are noted. The esophagus as visualized is within normal limits. Sliding-type hiatal hernia is noted. Lungs/Pleura: 6 mm nodule is noted in the right upper lobe best seen on image number 29 of series 6 and image number 31 of series 9. This is new from the prior exam. Diffuse emphysematous changes are identified. There are changes consistent with post radiation therapy and scarring emanating from the left hilum superiorly as well as into the lower lobe. These changes are mildly progressed from the prior exam and consistent with increased scarring. No new focal parenchymal nodule on the left is noted. Upper Abdomen: Hypodensity is noted within the liver similar to that seen on prior exam likely representing cyst or hemangioma. Previously seen  hypodensity within the medial segment of the left lobe of the liver is noted as well. Fatty infiltration of the liver is seen. The remainder of the upper abdomen is within normal limits. Musculoskeletal: Degenerative changes of the thoracic spine are noted. No acute bony abnormality is seen. Review of the MIP images confirms the above findings. IMPRESSION: No evidence of pulmonary emboli. Changes of prior radiation therapy on the left with increased scarring when compared with the prior exam. Hiatal hernia. Chronic changes in the upper abdomen as described. Aortic Atherosclerosis (ICD10-I70.0) and Emphysema (ICD10-J43.9). Electronically Signed   By: Inez Catalina M.D.   On: 05/09/2019 18:39   DG Chest Portable 1 View  Result Date: 05/09/2019 CLINICAL DATA:  Short of breath rule out edema EXAM: PORTABLE CHEST 1 VIEW COMPARISON:  01/08/2016 FINDINGS: Heart size upper normal. Vascular congestion with bilateral interstitial edema. No significant pleural effusion. Atherosclerotic aortic arch. Advanced degenerative changes in the shoulders bilaterally. IMPRESSION: Congestive heart failure with mild interstitial edema. Electronically Signed   By: Franchot Gallo M.D.   On: 05/09/2019 16:33    Microbiology: Recent Results (from the past 240 hour(s))  Respiratory Panel by RT PCR (Flu A&B, Covid) - Nasopharyngeal Swab     Status: None   Collection Time: 05/09/19  4:16 PM   Specimen: Nasopharyngeal Swab  Result Value Ref Range Status   SARS Coronavirus 2 by RT PCR NEGATIVE NEGATIVE Final    Comment: (NOTE) SARS-CoV-2 target nucleic acids are NOT DETECTED. The SARS-CoV-2 RNA is generally detectable in upper respiratoy specimens during the acute phase of infection. The lowest concentration of SARS-CoV-2 viral copies this assay can detect is 131 copies/mL. A negative result does not preclude SARS-Cov-2 infection and should not be used as the sole basis for treatment or other patient management decisions. A  negative result may occur with  improper specimen collection/handling, submission of specimen other than nasopharyngeal swab, presence of viral mutation(s) within the areas targeted by this assay, and inadequate number of viral copies (<131 copies/mL). A negative result must be combined with clinical observations, patient history, and epidemiological information. The expected result is Negative. Fact Sheet for Patients:  PinkCheek.be Fact Sheet for Healthcare Providers:  GravelBags.it This test is not yet ap proved or cleared by the [place] FDA and  has been authorized for detection and/or diagnosis of SARS-CoV-2 by FDA under an Emergency  Use Authorization (EUA). This EUA will remain  in effect (meaning this test can be used) for the duration of the COVID-19 declaration under Section 564(b)(1) of the Act, 21 U.S.C. section 360bbb-3(b)(1), unless the authorization is terminated or revoked sooner.    Influenza A by PCR NEGATIVE NEGATIVE Final   Influenza B by PCR NEGATIVE NEGATIVE Final    Comment: (NOTE) The Xpert Xpress SARS-CoV-2/FLU/RSV assay is intended as an aid in  the diagnosis of influenza from Nasopharyngeal swab specimens and  should not be used as a sole basis for treatment. Nasal washings and  aspirates are unacceptable for Xpert Xpress SARS-CoV-2/FLU/RSV  testing. Fact Sheet for Patients: PinkCheek.be Fact Sheet for Healthcare Providers: GravelBags.it This test is not yet approved or cleared by the [place] FDA and  has been authorized for detection and/or diagnosis of SARS-CoV-2 by  FDA under an Emergency Use Authorization (EUA). This EUA will remain  in effect (meaning this test can be used) for the duration of the  Covid-19 declaration under Section 564(b)(1) of the Act, 21  U.S.C. section 360bbb-3(b)(1), unless the authorization is   terminated or revoked. Performed at [facility], [address] [place], [place] [address]   MRSA PCR Screening     Status: None   Collection Time: 05/09/19  9:52 PM   Specimen: Nasal Mucosa; Nasopharyngeal  Result Value Ref Range Status   MRSA by PCR NEGATIVE NEGATIVE Final    Comment:        The GeneXpert MRSA Assay (FDA approved for NASAL specimens only), is one component of a comprehensive MRSA colonization surveillance program. It is not intended to diagnose MRSA infection nor to guide or monitor treatment for MRSA infections. Performed at [facility], [address] [place], [place] [address]      Labs: Basic Metabolic Panel: Recent Labs  Lab 05/10/19 0435 05/11/19 0405 05/13/19 0441 05/14/19 0258 05/15/19 0632  NA 139 141 138 140 139  K 4.5 4.3 4.5 4.1 4.0  CL 103 101 101 101 99  CO2 25 29 30  33* 31  GLUCOSE 202* 130* 217* 104* 97  BUN 19 18 34* 41* 35*  CREATININE 1.09* 1.02* 1.13* 1.24* 1.14*  CALCIUM 9.1 9.5 9.1 9.2 8.9  MG  --  2.2  --  2.2  --    Liver Function Tests: Recent Labs  Lab 05/09/19 1617 05/10/19 0435  AST 22 18  ALT 16 15  ALKPHOS 82 84  BILITOT 0.6 0.6  PROT 8.1 8.1  ALBUMIN 3.7 3.6   No results for input(s): LIPASE, AMYLASE in the last 168 hours. No results for input(s): AMMONIA in the last 168 hours. CBC: Recent Labs  Lab 05/09/19 1617 05/09/19 1617 05/09/19 2118 05/10/19 0435 05/11/19 0405 05/13/19 0441 05/14/19 0258  WBC 14.1*   < > 12.1* 9.7 11.2* 9.1 12.4*  NEUTROABS 10.8*  --   --  9.0*  --   --   --   HGB 10.8*   < > 10.7* 11.2* 13.0 11.4* 12.0  HCT 34.6*   < > 33.7* 37.3 42.8 36.5 39.2  MCV 85.9   < > 84.7 88.2 87.3 84.3 86.7  PLT 282   < > 279 285 370 341 369   < > = values in this interval not displayed.   Cardiac Enzymes: No results for input(s): CKTOTAL, CKMB, CKMBINDEX, TROPONINI in the last 168 hours. BNP: BNP (last 3 results) Recent Labs    05/09/19 1617  BNP 224.0*  ProBNP (last 3 results) No results for input(s): PROBNP in the last 8760 hours.  CBG: Recent Labs  Lab 05/15/19 1929 05/15/19 2312 05/16/19 0408 05/16/19 0428 05/16/19 0727  GLUCAP 326* 176* 69* 76 77    Active Problems:   Moderate dementia, with behavioral disturbance (HCC)   COPD with acute exacerbation (HCC)   Respiratory failure with hypoxia and hypercapnia (HCC)   Hyperglycemia due to diabetes mellitus (HCC)   GERD (gastroesophageal reflux disease)   HTN (hypertension)   HLD (hyperlipidemia)   Acute respiratory failure with hypoxia (HCC)   Shortness of breath   Time coordinating discharge: 38 minutes.  Signed:        Ava Swayze, DO [facility]  05/16/2019, 12:03 PM

## 2019-05-16 NOTE — TOC Transition Note (Signed)
Transition of Care [facility]) - CM/SW Discharge Note   Patient Details  Name: Cheryl [name] MRN: [id number] Date of Birth: 20-Jul-1940  Transition of Care [facility]) CM/SW Contact:  Eileen Stanford, LCSW Phone Number: 05/16/2019, 3:11 PM   Clinical Narrative:  Clinical Social Worker facilitated patient discharge including contacting patient family and facility to confirm patient discharge plans.  Clinical information faxed to facility and family agreeable with plan.  CSW arranged ambulance transport via [facility] to [facility].  RN to call [phone] for report prior to discharge.   Final next level of care: Skilled Nursing Facility Barriers to Discharge: No Barriers Identified   Patient Goals and CMS Choice   CMS Medicare.gov Compare Post Acute Care list provided to:: Patient Represenative (must comment) Choice offered to / list presented to : (Care Coordinator Etter Sjogren)  Discharge Placement              Patient chooses bed at: [facility] Patient to be transferred to facility by: [facility] Name of family member notified: Tammy at [facility] Patient and family notified of of transfer: 05/16/19  Discharge Plan and Services                                     Social Determinants of Health ([place]) Interventions     Readmission Risk Interventions Readmission Risk Prevention Plan 05/10/2019  Transportation Screening Complete  PCP or Specialist Appt within 3-5 Days Complete  HRI or Home Care Consult Complete  Medication Review (RN Care Manager) Complete  Some recent data might be hidden

## 2019-05-16 NOTE — Care Management Important Message (Signed)
Important Message  Patient Details  Name: Cheryl [name] MRN: [id number] Date of Birth: 1940/01/14   Medicare Important Message Given:  Yes     [name] 05/16/2019, 12:31 PM

## 2019-05-16 NOTE — Progress Notes (Signed)
Hypoglycemic Event  CBG: 69  Treatment: 4 oz juice/soda  Symptoms: None  Follow-up CBG: Time: 0428 CBG Result: 76  Possible Reasons for Event: Unknown  Comments/MD notified: Patient remained asymptomatic.   Cheryl [name]

## 2019-05-16 NOTE — NC FL2 (Addendum)
[place] LEVEL OF CARE SCREENING TOOL     IDENTIFICATION  Patient Name: Cheryl [name] Birthdate: 08-04-1940 Sex: female Admission Date (Current Location): 05/09/2019  [name] and [place] Number:  Engineering geologist and Address:  [facility], [address] [place], [place] [address]      Provider Number: [id number]  Attending Physician Name and Address:  Oren Binet*  Relative Name and Phone Number:  [place], [facility]) [phone]    Current Level of Care: Hospital Recommended Level of Care: [place] Prior Approval Number:    Date Approved/Denied:   PASRR Number:    Discharge Plan: SNF   Current Diagnoses: Patient Active Problem List   Diagnosis Date Noted  . Acute respiratory failure with hypoxia ([place])   . Shortness of breath   . COPD with acute exacerbation ([place]) 05/09/2019  . Respiratory failure with hypoxia and hypercapnia ([place]) 05/09/2019  . Hyperglycemia due to diabetes mellitus ([place]) 05/09/2019  . GERD (gastroesophageal reflux disease) 05/09/2019  . HTN (hypertension) 05/09/2019  . HLD (hyperlipidemia) 05/09/2019  . Moderate dementia, with behavioral disturbance ([place]) 10/06/2016  . Incontinence of feces 05/30/2016  . COPD exacerbation ([place]) 01/08/2016  . Hypokalemia 02/07/2015  . Cancer of left lung ([place]) 07/25/2011    Orientation RESPIRATION BLADDER Height & Weight     Self  Normal Incontinent Weight: 203 lb 3.2 oz (92.2 kg) Height:  5\' 1"  (154.9 cm)  BEHAVIORAL SYMPTOMS/MOOD NEUROLOGICAL BOWEL NUTRITION STATUS      Incontinent Diet(DYS 1 diet, thin liquids)  AMBULATORY STATUS COMMUNICATION OF NEEDS Skin   Limited Assist Verbally Normal                       Personal Care Assistance Level of Assistance  Bathing, Feeding, Dressing Bathing Assistance: Limited assistance Feeding assistance: Independent Dressing Assistance: Limited assistance Total Care Assistance: Limited  assistance   Functional Limitations Info  Sight, Hearing, Speech Sight Info: Adequate Hearing Info: Adequate Speech Info: Adequate    SPECIAL CARE FACTORS FREQUENCY  PT (By licensed PT), OT (By licensed OT)     PT Frequency: 5xweekly OT Frequency: 5xweekly            Contractures Contractures Info: Not present    Additional Factors Info  Code Status, Allergies Code Status Info: Full Code Allergies Info: NO known allergies           Current Medications (05/16/2019):  This is the current hospital active medication list Current Facility-Administered Medications  Medication Dose Route Frequency Provider Last Rate Last Admin  . albuterol (PROVENTIL) (2.5 MG/3ML) 0.083% nebulizer solution 2.5 mg  2.5 mg Inhalation Q6H Swayze, Ava, DO   2.5 mg at 05/16/19 0744  . alum & mag hydroxide-simeth (MAALOX/MYLANTA) 200-200-20 MG/5ML suspension 30 mL  30 mL Oral Daily PRN Arlan Organ, DO      . amLODipine (NORVASC) tablet 10 mg  10 mg Oral Daily Darien Ramus N, DO   10 mg at 05/16/19 0845  . aspirin tablet 325 mg  325 mg Oral Daily Arlan Organ, DO   325 mg at 05/16/19 0845  . atorvastatin (LIPITOR) tablet 20 mg  20 mg Oral QHS Arlan Organ, DO   20 mg at 05/15/19 2010  . memantine (NAMENDA XR) 24 hr capsule 28 mg  28 mg Oral Daily Darien Ramus N, DO   28 mg at 05/15/19 2011   And  . donepezil (ARICEPT) tablet 10 mg  10 mg Oral QHS Arlan Organ, DO   10 mg at 05/15/19 2010  . ferrous sulfate tablet 325 mg  325 mg Oral BID WC Arlan Organ, DO   325 mg at 05/16/19 0846  . fluticasone (FLONASE) 50 MCG/ACT nasal spray 2 spray  2 spray Each Nare Daily Arlan Organ, DO   2 spray at 05/16/19 0844  . heparin injection 5,000 Units  5,000 Units Subcutaneous Q8H Arlan Organ, DO   5,000 Units at 05/16/19 0550  . hydrALAZINE (APRESOLINE) injection 5 mg  5 mg Intravenous Q4H PRN Lorella Nimrod, MD   5 mg at 05/11/19 0037  . insulin aspart (novoLOG) injection 0-20 Units  0-20 Units  Subcutaneous Q4H Lorella Nimrod, MD   4 Units at 05/15/19 2317  . insulin aspart (novoLOG) injection 0-5 Units  0-5 Units Subcutaneous QHS Bell, Thomas N, DO      . insulin glargine (LANTUS) injection 10 Units  10 Units Subcutaneous Daily Swayze, Ava, DO   10 Units at 05/15/19 2317  . lisinopril (ZESTRIL) tablet 10 mg  10 mg Oral Daily Darien Ramus N, DO   10 mg at 05/16/19 0845  . loperamide (IMODIUM) capsule 2 mg  2 mg Oral PRN Arlan Organ, DO      . magnesium hydroxide (MILK OF MAGNESIA) suspension 30 mL  30 mL Oral Daily PRN Arlan Organ, DO      . metoprolol tartrate (LOPRESSOR) tablet 25 mg  25 mg Oral BID Swayze, Ava, DO   25 mg at 05/16/19 0845  . modafinil (PROVIGIL) tablet 100 mg  100 mg Oral Daily Darien Ramus N, DO   100 mg at 05/16/19 0845  . mometasone-formoterol (DULERA) 200-5 MCG/ACT inhaler 2 puff  2 puff Inhalation BID Arlan Organ, DO   2 puff at 05/16/19 0844  . multivitamin with minerals tablet 1 tablet  1 tablet Oral Daily Arlan Organ, DO   1 tablet at 05/16/19 0845  . pantoprazole (PROTONIX) EC tablet 40 mg  40 mg Oral Daily Darien Ramus N, DO   40 mg at 05/16/19 0845  . perphenazine (TRILAFON) tablet 4 mg  4 mg Oral TID Arlan Organ, DO   4 mg at 05/16/19 0845  . predniSONE (DELTASONE) tablet 60 mg  60 mg Oral Q breakfast Swayze, Ava, DO   60 mg at 05/16/19 0846  . QUEtiapine (SEROQUEL) tablet 300 mg  300 mg Oral QHS Arlan Organ, DO   300 mg at 05/15/19 2009  . Vitamin D (Ergocalciferol) (DRISDOL) capsule 50,000 Units  50,000 Units Oral Q30 days Arlan Organ, DO         Discharge Medications: Please see discharge summary for a list of discharge medications.  Relevant Imaging Results:  Relevant Lab Results:   Additional Information [phone]  Cheryl Stanford, LCSW

## 2019-05-16 NOTE — TOC Progression Note (Addendum)
Transition of Care [facility]) - Progression Note    Patient Details  Name: Cheryl [name] MRN: [id number] Date of Birth: 1940/06/17  Transition of Care [facility]) CM/SW Contact  Eileen Stanford, LCSW Phone Number: 05/16/2019, 1:53 PM  Clinical Narrative:   ALF states pt needs SNF. CSW attempted to call pt's son Richardson Landry however, no answer and unable to leave voicemail. CSW has reached out to Tammy with [facility] and she suggest CSW reach out to [facility] in order to determine if pt can go to SNF before returning to [place]. Pt is disoriented. CSW has reached out to [facility] and they can offer pt a bed. Pt will d/c today after Covid test comes back.      Barriers to Discharge: Continued Medical Work up  Expected Discharge Plan and Services         Living arrangements for the past 2 months: [place] Expected Discharge Date: 05/16/19                                     Social Determinants of Health (SDOH) Interventions    Readmission Risk Interventions Readmission Risk Prevention Plan 05/10/2019  Transportation Screening Complete  PCP or Specialist Appt within 3-5 Days Complete  HRI or Home Care Consult Complete  Medication Review (RN Care Manager) Complete  Some recent data might be hidden

## 2019-07-03 ENCOUNTER — Inpatient Hospital Stay
Admission: EM | Admit: 2019-07-03 | Discharge: 2019-07-14 | DRG: 291 | Disposition: A | Discharge status: Skilled Nursing Facility | Payer: Medicare Other | Source: Skilled Nursing Facility | Attending: Internal Medicine | Admitting: Internal Medicine

## 2019-07-03 ENCOUNTER — Emergency Department: Payer: Medicare Other

## 2019-07-03 ENCOUNTER — Other Ambulatory Visit: Payer: Self-pay

## 2019-07-03 DIAGNOSIS — I4892 Unspecified atrial flutter: Secondary | ICD-10-CM | POA: Diagnosis not present

## 2019-07-03 DIAGNOSIS — R579 Shock, unspecified: Secondary | ICD-10-CM | POA: Diagnosis not present

## 2019-07-03 DIAGNOSIS — N170 Acute kidney failure with tubular necrosis: Secondary | ICD-10-CM | POA: Diagnosis not present

## 2019-07-03 DIAGNOSIS — Z90710 Acquired absence of both cervix and uterus: Secondary | ICD-10-CM

## 2019-07-03 DIAGNOSIS — I495 Sick sinus syndrome: Secondary | ICD-10-CM

## 2019-07-03 DIAGNOSIS — E785 Hyperlipidemia, unspecified: Secondary | ICD-10-CM | POA: Diagnosis present

## 2019-07-03 DIAGNOSIS — G4733 Obstructive sleep apnea (adult) (pediatric): Secondary | ICD-10-CM | POA: Diagnosis present

## 2019-07-03 DIAGNOSIS — D509 Iron deficiency anemia, unspecified: Secondary | ICD-10-CM | POA: Diagnosis present

## 2019-07-03 DIAGNOSIS — Z79899 Other long term (current) drug therapy: Secondary | ICD-10-CM | POA: Diagnosis not present

## 2019-07-03 DIAGNOSIS — E1122 Type 2 diabetes mellitus with diabetic chronic kidney disease: Secondary | ICD-10-CM | POA: Diagnosis present

## 2019-07-03 DIAGNOSIS — J9621 Acute and chronic respiratory failure with hypoxia: Secondary | ICD-10-CM | POA: Diagnosis not present

## 2019-07-03 DIAGNOSIS — I4891 Unspecified atrial fibrillation: Secondary | ICD-10-CM | POA: Diagnosis not present

## 2019-07-03 DIAGNOSIS — N1831 Chronic kidney disease, stage 3a: Secondary | ICD-10-CM | POA: Diagnosis present

## 2019-07-03 DIAGNOSIS — R7989 Other specified abnormal findings of blood chemistry: Secondary | ICD-10-CM | POA: Diagnosis present

## 2019-07-03 DIAGNOSIS — F0391 Unspecified dementia with behavioral disturbance: Secondary | ICD-10-CM | POA: Diagnosis not present

## 2019-07-03 DIAGNOSIS — J9622 Acute and chronic respiratory failure with hypercapnia: Secondary | ICD-10-CM | POA: Diagnosis present

## 2019-07-03 DIAGNOSIS — F0151 Vascular dementia with behavioral disturbance: Secondary | ICD-10-CM | POA: Diagnosis not present

## 2019-07-03 DIAGNOSIS — R570 Cardiogenic shock: Secondary | ICD-10-CM | POA: Diagnosis not present

## 2019-07-03 DIAGNOSIS — J441 Chronic obstructive pulmonary disease with (acute) exacerbation: Secondary | ICD-10-CM | POA: Diagnosis present

## 2019-07-03 DIAGNOSIS — I5031 Acute diastolic (congestive) heart failure: Secondary | ICD-10-CM | POA: Diagnosis not present

## 2019-07-03 DIAGNOSIS — F329 Major depressive disorder, single episode, unspecified: Secondary | ICD-10-CM | POA: Diagnosis present

## 2019-07-03 DIAGNOSIS — Z7982 Long term (current) use of aspirin: Secondary | ICD-10-CM

## 2019-07-03 DIAGNOSIS — R0600 Dyspnea, unspecified: Secondary | ICD-10-CM

## 2019-07-03 DIAGNOSIS — F419 Anxiety disorder, unspecified: Secondary | ICD-10-CM | POA: Diagnosis present

## 2019-07-03 DIAGNOSIS — Z923 Personal history of irradiation: Secondary | ICD-10-CM

## 2019-07-03 DIAGNOSIS — I483 Typical atrial flutter: Secondary | ICD-10-CM | POA: Diagnosis not present

## 2019-07-03 DIAGNOSIS — I4819 Other persistent atrial fibrillation: Secondary | ICD-10-CM | POA: Diagnosis present

## 2019-07-03 DIAGNOSIS — Z794 Long term (current) use of insulin: Secondary | ICD-10-CM

## 2019-07-03 DIAGNOSIS — F209 Schizophrenia, unspecified: Secondary | ICD-10-CM | POA: Diagnosis present

## 2019-07-03 DIAGNOSIS — Z20822 Contact with and (suspected) exposure to covid-19: Secondary | ICD-10-CM | POA: Diagnosis present

## 2019-07-03 DIAGNOSIS — I5020 Unspecified systolic (congestive) heart failure: Secondary | ICD-10-CM | POA: Diagnosis not present

## 2019-07-03 DIAGNOSIS — F79 Unspecified intellectual disabilities: Secondary | ICD-10-CM | POA: Diagnosis present

## 2019-07-03 DIAGNOSIS — I10 Essential (primary) hypertension: Secondary | ICD-10-CM | POA: Diagnosis present

## 2019-07-03 DIAGNOSIS — I130 Hypertensive heart and chronic kidney disease with heart failure and stage 1 through stage 4 chronic kidney disease, or unspecified chronic kidney disease: Principal | ICD-10-CM | POA: Diagnosis present

## 2019-07-03 DIAGNOSIS — I5043 Acute on chronic combined systolic (congestive) and diastolic (congestive) heart failure: Secondary | ICD-10-CM | POA: Diagnosis present

## 2019-07-03 DIAGNOSIS — R778 Other specified abnormalities of plasma proteins: Secondary | ICD-10-CM | POA: Diagnosis not present

## 2019-07-03 DIAGNOSIS — K219 Gastro-esophageal reflux disease without esophagitis: Secondary | ICD-10-CM | POA: Diagnosis present

## 2019-07-03 DIAGNOSIS — F0390 Unspecified dementia without behavioral disturbance: Secondary | ICD-10-CM | POA: Diagnosis present

## 2019-07-03 DIAGNOSIS — Z85118 Personal history of other malignant neoplasm of bronchus and lung: Secondary | ICD-10-CM | POA: Diagnosis not present

## 2019-07-03 DIAGNOSIS — N179 Acute kidney failure, unspecified: Secondary | ICD-10-CM | POA: Diagnosis present

## 2019-07-03 DIAGNOSIS — I5021 Acute systolic (congestive) heart failure: Secondary | ICD-10-CM | POA: Diagnosis not present

## 2019-07-03 DIAGNOSIS — Z6837 Body mass index (BMI) 37.0-37.9, adult: Secondary | ICD-10-CM

## 2019-07-03 DIAGNOSIS — G92 Toxic encephalopathy: Secondary | ICD-10-CM | POA: Diagnosis not present

## 2019-07-03 DIAGNOSIS — I5023 Acute on chronic systolic (congestive) heart failure: Secondary | ICD-10-CM | POA: Diagnosis not present

## 2019-07-03 DIAGNOSIS — R0902 Hypoxemia: Secondary | ICD-10-CM | POA: Diagnosis present

## 2019-07-03 DIAGNOSIS — I484 Atypical atrial flutter: Secondary | ICD-10-CM | POA: Diagnosis present

## 2019-07-03 DIAGNOSIS — Z87891 Personal history of nicotine dependence: Secondary | ICD-10-CM | POA: Diagnosis not present

## 2019-07-03 DIAGNOSIS — E1129 Type 2 diabetes mellitus with other diabetic kidney complication: Secondary | ICD-10-CM | POA: Diagnosis not present

## 2019-07-03 DIAGNOSIS — C3490 Malignant neoplasm of unspecified part of unspecified bronchus or lung: Secondary | ICD-10-CM

## 2019-07-03 DIAGNOSIS — J9601 Acute respiratory failure with hypoxia: Secondary | ICD-10-CM | POA: Diagnosis not present

## 2019-07-03 DIAGNOSIS — I429 Cardiomyopathy, unspecified: Secondary | ICD-10-CM | POA: Diagnosis present

## 2019-07-03 DIAGNOSIS — I272 Pulmonary hypertension, unspecified: Secondary | ICD-10-CM | POA: Diagnosis present

## 2019-07-03 DIAGNOSIS — Z7951 Long term (current) use of inhaled steroids: Secondary | ICD-10-CM

## 2019-07-03 LAB — URINE DRUG SCREEN, QUALITATIVE (ARMC ONLY)
Amphetamines, Ur Screen: NOT DETECTED
Barbiturates, Ur Screen: NOT DETECTED
Benzodiazepine, Ur Scrn: NOT DETECTED
Cannabinoid 50 Ng, Ur ~~LOC~~: NOT DETECTED
Cocaine Metabolite,Ur ~~LOC~~: NOT DETECTED
MDMA (Ecstasy)Ur Screen: NOT DETECTED
Methadone Scn, Ur: NOT DETECTED
Opiate, Ur Screen: NOT DETECTED
Phencyclidine (PCP) Ur S: NOT DETECTED
Tricyclic, Ur Screen: NOT DETECTED

## 2019-07-03 LAB — CBC
HCT: 35.6 % — ABNORMAL LOW (ref 36.0–46.0)
Hemoglobin: 10.5 g/dL — ABNORMAL LOW (ref 12.0–15.0)
MCH: 27 pg (ref 26.0–34.0)
MCHC: 29.5 g/dL — ABNORMAL LOW (ref 30.0–36.0)
MCV: 91.5 fL (ref 80.0–100.0)
Platelets: 357 10*3/uL (ref 150–400)
RBC: 3.89 MIL/uL (ref 3.87–5.11)
RDW: 16.8 % — ABNORMAL HIGH (ref 11.5–15.5)
WBC: 7.1 10*3/uL (ref 4.0–10.5)
nRBC: 0.4 % — ABNORMAL HIGH (ref 0.0–0.2)

## 2019-07-03 LAB — BLOOD GAS, VENOUS
Acid-base deficit: 1.2 mmol/L (ref 0.0–2.0)
Bicarbonate: 26.1 mmol/L (ref 20.0–28.0)
O2 Saturation: 99.4 %
Patient temperature: 37
pCO2, Ven: 53 mmHg (ref 44.0–60.0)
pH, Ven: 7.3 (ref 7.250–7.430)
pO2, Ven: 173 mmHg — ABNORMAL HIGH (ref 32.0–45.0)

## 2019-07-03 LAB — BASIC METABOLIC PANEL
Anion gap: 10 (ref 5–15)
BUN: 33 mg/dL — ABNORMAL HIGH (ref 8–23)
CO2: 24 mmol/L (ref 22–32)
Calcium: 8.8 mg/dL — ABNORMAL LOW (ref 8.9–10.3)
Chloride: 111 mmol/L (ref 98–111)
Creatinine, Ser: 1.61 mg/dL — ABNORMAL HIGH (ref 0.44–1.00)
GFR calc Af Amer: 35 mL/min — ABNORMAL LOW (ref >60)
GFR calc non Af Amer: 31 mL/min — ABNORMAL LOW (ref >60)
Glucose, Bld: 229 mg/dL — ABNORMAL HIGH (ref 70–99)
Potassium: 3.7 mmol/L (ref 3.5–5.1)
Sodium: 145 mmol/L (ref 135–145)

## 2019-07-03 LAB — SARS CORONAVIRUS 2 BY RT PCR (HOSPITAL ORDER, PERFORMED IN ~~LOC~~ HOSPITAL LAB): SARS Coronavirus 2: NEGATIVE

## 2019-07-03 LAB — TROPONIN I (HIGH SENSITIVITY)
Troponin I (High Sensitivity): 50 ng/L — ABNORMAL HIGH (ref <18)
Troponin I (High Sensitivity): 53 ng/L — ABNORMAL HIGH (ref <18)
Troponin I (High Sensitivity): 42 ng/L — ABNORMAL HIGH (ref <18)
Troponin I (High Sensitivity): 43 ng/L — ABNORMAL HIGH (ref <18)

## 2019-07-03 LAB — GLUCOSE, CAPILLARY
Glucose-Capillary: 128 mg/dL — ABNORMAL HIGH (ref 70–99)
Glucose-Capillary: 141 mg/dL — ABNORMAL HIGH (ref 70–99)
Glucose-Capillary: 244 mg/dL — ABNORMAL HIGH (ref 70–99)

## 2019-07-03 LAB — BRAIN NATRIURETIC PEPTIDE: B Natriuretic Peptide: 1303.1 pg/mL — ABNORMAL HIGH (ref 0.0–100.0)

## 2019-07-03 MED ORDER — METHYLPREDNISOLONE SODIUM SUCC 40 MG IJ SOLR
40.0000 mg | Freq: Two times a day (BID) | INTRAMUSCULAR | Status: DC
  Administered 2019-07-03 – 2019-07-06 (×6): 40 mg via INTRAVENOUS
  Filled 2019-07-03 (×6): qty 1

## 2019-07-03 MED ORDER — DONEPEZIL HCL 5 MG PO TABS
10.0000 mg | ORAL_TABLET | Freq: Every day | ORAL | Status: DC
  Administered 2019-07-03 – 2019-07-13 (×11): 10 mg via ORAL
  Filled 2019-07-03 (×14): qty 2

## 2019-07-03 MED ORDER — IOHEXOL 350 MG/ML SOLN
60.0000 mL | Freq: Once | INTRAVENOUS | Status: AC | PRN
  Administered 2019-07-03: 60 mL via INTRAVENOUS

## 2019-07-03 MED ORDER — METOPROLOL TARTRATE 25 MG PO TABS
25.0000 mg | ORAL_TABLET | Freq: Two times a day (BID) | ORAL | Status: DC
  Administered 2019-07-03 – 2019-07-04 (×2): 25 mg via ORAL
  Filled 2019-07-03 (×2): qty 1

## 2019-07-03 MED ORDER — ONDANSETRON HCL 4 MG/2ML IJ SOLN: 4.0000 mg | Freq: Three times a day (TID) | INTRAMUSCULAR | Status: DC | PRN

## 2019-07-03 MED ORDER — ALPRAZOLAM 0.25 MG PO TABS
0.2500 mg | ORAL_TABLET | Freq: Two times a day (BID) | ORAL | Status: DC
  Administered 2019-07-03 – 2019-07-13 (×20): 0.25 mg via ORAL
  Filled 2019-07-03 (×20): qty 1

## 2019-07-03 MED ORDER — DM-GUAIFENESIN ER 30-600 MG PO TB12
1.0000 | ORAL_TABLET | Freq: Two times a day (BID) | ORAL | Status: DC
  Administered 2019-07-03 – 2019-07-14 (×23): 1 via ORAL
  Filled 2019-07-03 (×23): qty 1

## 2019-07-03 MED ORDER — PANTOPRAZOLE SODIUM 40 MG PO TBEC
40.0000 mg | DELAYED_RELEASE_TABLET | Freq: Every day | ORAL | Status: DC
  Administered 2019-07-04 – 2019-07-14 (×11): 40 mg via ORAL
  Filled 2019-07-03 (×11): qty 1

## 2019-07-03 MED ORDER — IPRATROPIUM-ALBUTEROL 0.5-2.5 (3) MG/3ML IN SOLN
3.0000 mL | RESPIRATORY_TRACT | Status: DC
  Administered 2019-07-03 – 2019-07-04 (×2): 3 mL via RESPIRATORY_TRACT
  Filled 2019-07-03 (×2): qty 3

## 2019-07-03 MED ORDER — ATORVASTATIN CALCIUM 20 MG PO TABS
20.0000 mg | ORAL_TABLET | Freq: Every day | ORAL | Status: DC
  Administered 2019-07-03 – 2019-07-13 (×11): 20 mg via ORAL
  Filled 2019-07-03 (×11): qty 1

## 2019-07-03 MED ORDER — AZITHROMYCIN 250 MG PO TABS
250.0000 mg | ORAL_TABLET | Freq: Every day | ORAL | Status: AC
  Administered 2019-07-04 – 2019-07-07 (×4): 250 mg via ORAL
  Filled 2019-07-03 (×4): qty 1

## 2019-07-03 MED ORDER — METHYLPREDNISOLONE SODIUM SUCC 125 MG IJ SOLR
125.0000 mg | Freq: Once | INTRAMUSCULAR | Status: AC
  Administered 2019-07-03: 125 mg via INTRAVENOUS
  Filled 2019-07-03: qty 2

## 2019-07-03 MED ORDER — DILTIAZEM HCL 25 MG/5ML IV SOLN
10.0000 mg | Freq: Once | INTRAVENOUS | Status: AC
  Administered 2019-07-03: 10 mg via INTRAVENOUS
  Filled 2019-07-03: qty 5

## 2019-07-03 MED ORDER — QUETIAPINE FUMARATE 25 MG PO TABS
200.0000 mg | ORAL_TABLET | Freq: Every day | ORAL | Status: DC
  Administered 2019-07-03 – 2019-07-13 (×11): 200 mg via ORAL
  Filled 2019-07-03: qty 8
  Filled 2019-07-03: qty 1
  Filled 2019-07-03 (×8): qty 8
  Filled 2019-07-03: qty 1

## 2019-07-03 MED ORDER — METOPROLOL TARTRATE 5 MG/5ML IV SOLN
2.5000 mg | INTRAVENOUS | Status: DC | PRN
  Administered 2019-07-03 – 2019-07-04 (×2): 2.5 mg via INTRAVENOUS
  Filled 2019-07-03 (×2): qty 5

## 2019-07-03 MED ORDER — ASPIRIN 325 MG PO TABS
325.0000 mg | ORAL_TABLET | Freq: Every day | ORAL | Status: DC
  Administered 2019-07-04 – 2019-07-05 (×2): 325 mg via ORAL
  Filled 2019-07-03 (×2): qty 1

## 2019-07-03 MED ORDER — ALBUTEROL SULFATE (2.5 MG/3ML) 0.083% IN NEBU: 2.5000 mg | INHALATION_SOLUTION | RESPIRATORY_TRACT | Status: DC | PRN

## 2019-07-03 MED ORDER — FERROUS SULFATE 325 (65 FE) MG PO TABS
325.0000 mg | ORAL_TABLET | Freq: Two times a day (BID) | ORAL | Status: DC
  Administered 2019-07-04 – 2019-07-14 (×21): 325 mg via ORAL
  Filled 2019-07-03 (×21): qty 1

## 2019-07-03 MED ORDER — PERPHENAZINE 4 MG PO TABS
4.0000 mg | ORAL_TABLET | Freq: Three times a day (TID) | ORAL | Status: DC
  Administered 2019-07-03 – 2019-07-14 (×32): 4 mg via ORAL
  Filled 2019-07-03 (×35): qty 1

## 2019-07-03 MED ORDER — IPRATROPIUM-ALBUTEROL 0.5-2.5 (3) MG/3ML IN SOLN
3.0000 mL | Freq: Once | RESPIRATORY_TRACT | Status: AC
  Administered 2019-07-03: 3 mL via RESPIRATORY_TRACT
  Filled 2019-07-03: qty 3

## 2019-07-03 MED ORDER — MAGNESIUM HYDROXIDE 400 MG/5ML PO SUSP: 30.0000 mL | Freq: Every day | ORAL | Status: DC | PRN

## 2019-07-03 MED ORDER — MEMANTINE HCL ER 28 MG PO CP24
28.0000 mg | ORAL_CAPSULE | Freq: Every day | ORAL | Status: DC
  Administered 2019-07-03 – 2019-07-13 (×11): 28 mg via ORAL
  Filled 2019-07-03 (×13): qty 1

## 2019-07-03 MED ORDER — DILTIAZEM HCL 25 MG/5ML IV SOLN
5.0000 mg | Freq: Once | INTRAVENOUS | Status: AC
  Administered 2019-07-03: 5 mg via INTRAVENOUS
  Filled 2019-07-03: qty 5

## 2019-07-03 MED ORDER — FLUTICASONE PROPIONATE 50 MCG/ACT NA SUSP
2.0000 | Freq: Every day | NASAL | Status: DC
  Administered 2019-07-04 – 2019-07-14 (×11): 2 via NASAL
  Filled 2019-07-03 (×2): qty 16

## 2019-07-03 MED ORDER — AMLODIPINE BESYLATE 10 MG PO TABS
10.0000 mg | ORAL_TABLET | Freq: Every day | ORAL | Status: DC
  Administered 2019-07-04: 10 mg via ORAL
  Filled 2019-07-03: qty 1

## 2019-07-03 MED ORDER — MEMANTINE HCL-DONEPEZIL HCL 28-10 MG PO CP24
1.0000 | ORAL_CAPSULE | Freq: Every day | ORAL | Status: DC
Start: 2019-07-03 — End: 2019-07-03

## 2019-07-03 MED ORDER — MODAFINIL 100 MG PO TABS
100.0000 mg | ORAL_TABLET | Freq: Every day | ORAL | Status: DC
  Administered 2019-07-04 – 2019-07-14 (×11): 100 mg via ORAL
  Filled 2019-07-03 (×11): qty 1

## 2019-07-03 MED ORDER — HYDRALAZINE HCL 20 MG/ML IJ SOLN: 5.0000 mg | INTRAMUSCULAR | Status: DC | PRN

## 2019-07-03 MED ORDER — ALUM & MAG HYDROXIDE-SIMETH 200-200-20 MG/5ML PO SUSP: 30.0000 mL | Freq: Every day | ORAL | Status: DC | PRN

## 2019-07-03 MED ORDER — AZITHROMYCIN 250 MG PO TABS
500.0000 mg | ORAL_TABLET | Freq: Every day | ORAL | Status: AC
  Administered 2019-07-03: 500 mg via ORAL
  Filled 2019-07-03: qty 2

## 2019-07-03 MED ORDER — ACETAMINOPHEN 325 MG PO TABS: 650.0000 mg | ORAL_TABLET | Freq: Four times a day (QID) | ORAL | Status: DC | PRN

## 2019-07-03 MED ORDER — FUROSEMIDE 10 MG/ML IJ SOLN
20.0000 mg | Freq: Once | INTRAMUSCULAR | Status: AC
  Administered 2019-07-03: 20 mg via INTRAVENOUS
  Filled 2019-07-03: qty 4

## 2019-07-03 MED ORDER — INSULIN GLARGINE 100 UNIT/ML ~~LOC~~ SOLN
7.0000 [IU] | Freq: Every day | SUBCUTANEOUS | Status: DC
  Administered 2019-07-04 – 2019-07-12 (×9): 7 [IU] via SUBCUTANEOUS
  Filled 2019-07-03 (×13): qty 0.07

## 2019-07-03 MED ORDER — ENOXAPARIN SODIUM 40 MG/0.4ML ~~LOC~~ SOLN
40.0000 mg | SUBCUTANEOUS | Status: DC
  Administered 2019-07-03: 40 mg via SUBCUTANEOUS
  Filled 2019-07-03: qty 0.4

## 2019-07-03 MED ORDER — MOMETASONE FURO-FORMOTEROL FUM 200-5 MCG/ACT IN AERO
2.0000 | INHALATION_SPRAY | Freq: Two times a day (BID) | RESPIRATORY_TRACT | Status: DC
  Administered 2019-07-03 – 2019-07-14 (×21): 2 via RESPIRATORY_TRACT
  Filled 2019-07-03 (×2): qty 8.8

## 2019-07-03 MED ORDER — INSULIN ASPART 100 UNIT/ML ~~LOC~~ SOLN
0.0000 [IU] | SUBCUTANEOUS | Status: DC
  Administered 2019-07-03: 1 [IU] via SUBCUTANEOUS
  Administered 2019-07-03: 3 [IU] via SUBCUTANEOUS
  Administered 2019-07-03: 1 [IU] via SUBCUTANEOUS
  Administered 2019-07-04: 2 [IU] via SUBCUTANEOUS
  Administered 2019-07-04 (×2): 3 [IU] via SUBCUTANEOUS
  Administered 2019-07-04: 1 [IU] via SUBCUTANEOUS
  Administered 2019-07-04: 3 [IU] via SUBCUTANEOUS
  Administered 2019-07-04 – 2019-07-05 (×3): 2 [IU] via SUBCUTANEOUS
  Administered 2019-07-05 (×2): 3 [IU] via SUBCUTANEOUS
  Administered 2019-07-05: 2 [IU] via SUBCUTANEOUS
  Administered 2019-07-05 – 2019-07-06 (×2): 3 [IU] via SUBCUTANEOUS
  Administered 2019-07-06: 1 [IU] via SUBCUTANEOUS
  Administered 2019-07-06: 3 [IU] via SUBCUTANEOUS
  Administered 2019-07-06: 2 [IU] via SUBCUTANEOUS
  Administered 2019-07-07 (×2): 5 [IU] via SUBCUTANEOUS
  Administered 2019-07-07: 1 [IU] via SUBCUTANEOUS
  Administered 2019-07-07: 5 [IU] via SUBCUTANEOUS
  Administered 2019-07-07: 3 [IU] via SUBCUTANEOUS
  Administered 2019-07-08: 1 [IU] via SUBCUTANEOUS
  Administered 2019-07-08: 2 [IU] via SUBCUTANEOUS
  Administered 2019-07-08: 3 [IU] via SUBCUTANEOUS
  Administered 2019-07-09 (×2): 2 [IU] via SUBCUTANEOUS
  Administered 2019-07-09 (×3): 1 [IU] via SUBCUTANEOUS
  Administered 2019-07-10 – 2019-07-11 (×4): 2 [IU] via SUBCUTANEOUS
  Filled 2019-07-03 (×35): qty 1

## 2019-07-03 NOTE — ED Notes (Signed)
Phlebotomy with pt to redraw labs

## 2019-07-03 NOTE — ED Notes (Addendum)
First Nurse Note: Pt to ED via [facility] from [facility] for respiratory pts. RA sats in the 80's On room air,  92% on 4 Liters of O2

## 2019-07-03 NOTE — ED Notes (Signed)
Pt extremely hard stick. IV won't draw. Stuck 3 times in triage. Some blood collected. Sent to lab at this time.

## 2019-07-03 NOTE — H&P (Addendum)
History and Physical    Cheryl [name] GYJ:[id number] DOB: [birth year] DOA: 07/03/2019  Referring MD/NP/PA:   PCP: Letta Median, MD   Patient coming from:  The patient is coming from SNF.  At baseline, pt is dependent for most of ADL.        Chief Complaint: SOB  HPI: Cheryl [name] is a 79 y.o. female with medical history significant of hypertension, hyperlipidemia, diabetes mellitus, COPD, GERD, depression, anxiety, lung cancer (s/p of radiation therapy), dementia, iron deficiency anemia, CKD-3, who presents with shortness of breath.  Patient has dementia, and is a poor historian, therefore, most of the history is obtained by discussing the case with ED physician, per EMS report, and with the nursing staff.  History is limited. Patient has been having shortness of breath for several days, which has been progressively worsening.  Patient is not on oxygen at home, but requiring 4 L oxygen with oxygen saturation 100% in ED. Patient has dry cough, but denies chest pain.  No nausea, vomiting, diarrhea, abdominal pain.  Denies symptoms of UTI.  Patient moves all extremities.  ED Course: pt was found to have WBC 7.1, BMP 1303, troponin 50, 53, pending COVID-19 PCR, worsening renal function, temperature normal, blood pressure 130/100, tachycardia, tachypnea.  Chest x-ray showed cardiomegaly without infiltration.  CT angiogram is negative for PE.  Patient is admitted to progressive bed as inpatient  Review of Systems: Could not be reviewed accurately due to dementia  Allergy: No Known Allergies  Past Medical History:  Diagnosis Date  . COPD (chronic obstructive pulmonary disease) ([place])   . Dementia ([place])   . Diabetes mellitus without complication ([place])   . GERD (gastroesophageal reflux disease)   . Hyperlipidemia   . Hypertension   . Lung cancer ([place])   . Mental retardation   . Schizophrenia [facility])     Past Surgical History:  Procedure Laterality Date  . ABDOMINAL HYSTERECTOMY       Social History:  reports that she quit smoking about 2 years ago. Her smoking use included cigarettes. She smoked 0.25 packs per day. She has never used smokeless tobacco. She reports that she does not drink alcohol and does not use drugs.  Family History:  Family History  Problem Relation Age of Onset  . Breast cancer Neg Hx      Prior to Admission medications   Medication Sig Start Date End Date Taking? Authorizing Provider  acetaminophen (TYLENOL) 325 MG tablet Take 650 mg by mouth every 4 (four) hours as needed for mild pain or fever.     [provider]  albuterol (PROVENTIL) (2.5 MG/3ML) 0.083% nebulizer solution Inhale 3 mLs (2.5 mg total) into the lungs every 6 (six) hours. 05/15/19   Swayze, Ava, DO  ALPRAZolam (XANAX) 0.25 MG tablet Take 1 tablet (0.25 mg total) by mouth 2 (two) times daily. 05/15/19   Swayze, Ava, DO  aluminum-magnesium hydroxide-simethicone (MAALOX) 263-785-88 MG/5ML SUSP Take 30 mLs by mouth daily as needed (indegestion or heartburn).     [provider]  amLODipine (NORVASC) 10 MG tablet Take 10 mg by mouth daily.     [provider]  aspirin 325 MG tablet Take 325 mg by mouth daily.     [provider]  atorvastatin (LIPITOR) 20 MG tablet Take 20 mg by mouth at bedtime.     [provider]  ferrous sulfate 325 (65 FE) MG tablet Take 325 mg by mouth 2 (two) times daily with a meal.  [provider]  fluticasone (FLONASE) 50 MCG/ACT nasal spray Place 2 sprays into both nostrils daily.    [provider]  Fluticasone-Salmeterol (ADVAIR DISKUS) 250-50 MCG/DOSE AEPB Inhale 1 puff into the lungs 2 (two) times daily.    [provider]  insulin glargine (LANTUS) 100 UNIT/ML injection Inject 0.1 mLs (10 Units total) into the skin daily. 05/17/19   Vashti Hey, MD  lisinopril (PRINIVIL,ZESTRIL) 10 MG tablet Take 10 mg by mouth daily.     [provider]  loperamide (IMODIUM)  2 MG capsule Take 2 mg by mouth as needed for diarrhea or loose stools. (max 4 doses in 12 hours)    [provider]  magnesium hydroxide (MILK OF MAGNESIA) 400 MG/5ML suspension Take 30 mLs by mouth daily as needed for moderate constipation.     [provider]  Memantine HCl-Donepezil HCl (NAMZARIC) 28-10 MG CP24 Take 1 capsule by mouth daily. 05/15/19   Swayze, Ava, DO  metFORMIN (GLUCOPHAGE) 500 MG tablet Take 500 mg by mouth 2 (two) times daily with a meal.     [provider]  metoprolol tartrate (LOPRESSOR) 25 MG tablet Take 1 tablet (25 mg total) by mouth 2 (two) times daily. 05/15/19   Swayze, Ava, DO  modafinil (PROVIGIL) 100 MG tablet Take 1 tablet (100 mg total) by mouth daily. 05/15/19   Swayze, Ava, DO  Multiple Vitamin (MULTI-VITAMINS) TABS Take 1 tablet by mouth daily.     [provider]  omeprazole (PRILOSEC) 40 MG capsule Take 40 mg by mouth daily.     [provider]  perphenazine (TRILAFON) 4 MG tablet Take 1 tablet (4 mg total) by mouth 3 (three) times daily. 05/15/19   Swayze, Ava, DO  Phenylephrine-DM-GG ([employer] TUSSIN COUGH/COLD CF) 5-10-100 MG/5ML LIQD Take 10 mLs by mouth every 6 (six) hours as needed (cough).    [provider]  QUEtiapine (SEROQUEL) 300 MG tablet Take 1 tablet (300 mg total) by mouth at bedtime. 05/15/19   Swayze, Ava, DO  sodium phosphate (FLEET) 7-19 GM/118ML ENEM Place 1 enema rectally daily as needed for severe constipation.    [provider]  Vitamin D, Ergocalciferol, (DRISDOL) 50000 UNITS CAPS capsule Take 50,000 Units by mouth every 30 (thirty) days.     [provider]    Physical Exam: Vitals:   07/03/19 1656 07/03/19 1657 07/03/19 1658 07/03/19 1730  BP:    (!) 151/91  Pulse: (!) 127 (!) 126 (!) 125 (!) 129  Resp: 15 14 15 18   Temp:      TempSrc:      SpO2: 92% 91% 91% 100%  Weight:      Height:       General: Not in acute distress HEENT:       Eyes: PERRL, EOMI, no  scleral icterus.       ENT: No discharge from the ears and nose, no pharynx injection, no tonsillar enlargement.        Neck: No JVD, no bruit, no mass felt. Heme: No neck lymph node enlargement. Cardiac: S1/S2, RRR, No murmurs, No gallops or rubs. Respiratory: Has wheezing and rhonchi bilaterally GI: Soft, nondistended, nontender, no rebound pain, no organomegaly, BS present. GU: No hematuria Ext: 1+ pitting leg edema bilaterally. 1+DP/PT pulse bilaterally. Musculoskeletal: No joint deformities, No joint redness or warmth, no limitation of ROM in spin. Skin: No rashes.  Neuro: Patient knows her own name, answered few questions, not orientated to place and time. Cranial  nerves II-XII grossly intact, moves all extremities. Psych: Patient is not psychotic, no suicidal or hemocidal ideation.  Labs on Admission: I have personally reviewed following labs and imaging studies  CBC: Recent Labs  Lab 07/03/19 0947  WBC 7.1  HGB 10.5*  HCT 35.6*  MCV 91.5  PLT 563   Basic Metabolic Panel: Recent Labs  Lab 07/03/19 0947  NA 145  K 3.7  CL 111  CO2 24  GLUCOSE 229*  BUN 33*  CREATININE 1.61*  CALCIUM 8.8*   GFR: Estimated Creatinine Clearance: 30.3 mL/min (A) (by C-G formula based on SCr of 1.61 mg/dL (H)). Liver Function Tests: No results for input(s): AST, ALT, ALKPHOS, BILITOT, PROT, ALBUMIN in the last 168 hours. No results for input(s): LIPASE, AMYLASE in the last 168 hours. No results for input(s): AMMONIA in the last 168 hours. Coagulation Profile: No results for input(s): INR, PROTIME in the last 168 hours. Cardiac Enzymes: No results for input(s): CKTOTAL, CKMB, CKMBINDEX, TROPONINI in the last 168 hours. BNP (last 3 results) No results for input(s): PROBNP in the last 8760 hours. HbA1C: No results for input(s): HGBA1C in the last 72 hours. CBG: Recent Labs  Lab 07/03/19 1413 07/03/19 1704  GLUCAP 128* 141*   Lipid Profile: No results for input(s): CHOL, HDL,  LDLCALC, TRIG, CHOLHDL, LDLDIRECT in the last 72 hours. Thyroid Function Tests: No results for input(s): TSH, T4TOTAL, FREET4, T3FREE, THYROIDAB in the last 72 hours. Anemia Panel: No results for input(s): VITAMINB12, FOLATE, FERRITIN, TIBC, IRON, RETICCTPCT in the last 72 hours. Urine analysis:    Component Value Date/Time   COLORURINE YELLOW (A) 05/09/2019 2335   APPEARANCEUR CLEAR (A) 05/09/2019 2335   APPEARANCEUR Hazy 07/26/2013 1013   LABSPEC 1.038 (H) 05/09/2019 2335   LABSPEC 1.029 07/26/2013 1013   PHURINE 5.0 05/09/2019 2335   GLUCOSEU 50 (A) 05/09/2019 2335   GLUCOSEU Negative 07/26/2013 1013   HGBUR NEGATIVE 05/09/2019 2335   BILIRUBINUR NEGATIVE 05/09/2019 2335   BILIRUBINUR Negative 07/26/2013 1013   KETONESUR 5 (A) 05/09/2019 2335   PROTEINUR 30 (A) 05/09/2019 2335   NITRITE NEGATIVE 05/09/2019 2335   LEUKOCYTESUR NEGATIVE 05/09/2019 2335   LEUKOCYTESUR Negative 07/26/2013 1013   Sepsis Labs: @LABRCNTIP (procalcitonin:4,lacticidven:4) ) Recent Results (from the past 240 hour(s))  SARS Coronavirus 2 by RT PCR (hospital order, performed in [place] hospital lab) Nasopharyngeal Nasopharyngeal Swab     Status: None   Collection Time: 07/03/19  2:19 PM   Specimen: Nasopharyngeal Swab  Result Value Ref Range Status   SARS Coronavirus 2 NEGATIVE NEGATIVE Final    Comment: (NOTE) SARS-CoV-2 target nucleic acids are NOT DETECTED.  The SARS-CoV-2 RNA is generally detectable in upper and lower respiratory specimens during the acute phase of infection. The lowest concentration of SARS-CoV-2 viral copies this assay can detect is 250 copies / mL. A negative result does not preclude SARS-CoV-2 infection and should not be used as the sole basis for treatment or other patient management decisions.  A negative result may occur with improper specimen collection / handling, submission of specimen other than nasopharyngeal swab, presence of viral mutation(s) within the areas  targeted by this assay, and inadequate number of viral copies (<250 copies / mL). A negative result must be combined with clinical observations, patient history, and epidemiological information.  Fact Sheet for Patients:   StrictlyIdeas.no  Fact Sheet for Healthcare Providers: BankingDealers.co.za  This test is not yet approved or  cleared by the [place] FDA and has been authorized  for detection and/or diagnosis of SARS-CoV-2 by FDA under an Emergency Use Authorization (EUA).  This EUA will remain in effect (meaning this test can be used) for the duration of the COVID-19 declaration under Section 564(b)(1) of the Act, 21 U.S.C. section 360bbb-3(b)(1), unless the authorization is terminated or revoked sooner.  Performed at [facility], [address], [place], [place] [address]      Radiological Exams on Admission: DG Chest 2 View  Result Date: 07/03/2019 CLINICAL DATA:  Shortness of breath and hypoxia. History of lung cancer post radiation. EXAM: CHEST - 2 VIEW COMPARISON:  05/09/2019 and CT 05/09/2019 FINDINGS: Patient slightly rotated to the left. Lungs are adequately inflated without focal airspace consolidation, effusion or pneumothorax. Known left perihilar scarring in this patient with history of lung cancer post radiation. Mild stable cardiomegaly. Remainder of the exam is unchanged. Minimal linear scarring over the left upper lung. IMPRESSION: 1.  No acute findings. 2. Mild stable cardiomegaly. Stable scarring over the left hilar region in this patient with history of lung cancer post radiation. Electronically Signed   By: Marin Olp M.D.   On: 07/03/2019 11:01   CT Angio Chest PE W and/or Wo Contrast  Result Date: 07/03/2019 CLINICAL DATA:  Shortness of breath.  Wheezing.  Tachycardia. EXAM: CT ANGIOGRAPHY CHEST WITH CONTRAST TECHNIQUE: Multidetector CT imaging of the chest was performed using the standard  protocol during bolus administration of intravenous contrast. Multiplanar CT image reconstructions and MIPs were obtained to evaluate the vascular anatomy. CONTRAST:  21mL OMNIPAQUE IOHEXOL 350 MG/ML SOLN COMPARISON:  CT scan dated 05/09/2019 FINDINGS: Cardiovascular: Satisfactory opacification of the pulmonary arteries to the segmental level. No evidence of pulmonary embolism. Borderline cardiomegaly. Trace pericardial effusion. Aortic atherosclerosis. Scattered coronary artery calcifications. Mediastinum/Nodes: There is bronchomalacia of the mainstem bronchi bilaterally. Trachea appears normal. Esophagus is normal. Large chronic hiatal hernia. Lungs/Pleura: Stable parenchymal scarring anteromedially in the right hemithorax and in the left perihilar region and at the left lung base, unchanged. Tiny new right effusion. Upper Abdomen: No acute abnormality. Stable 17 mm low-density lesion in the right lobe of the liver consistent with a cyst. Musculoskeletal: No chest wall abnormality. No acute or significant osseous findings. Review of the MIP images confirms the above findings. IMPRESSION: 1. No pulmonary emboli. 2. Bronchomalacia of mainstem bronchi bilaterally. Both mainstem bronchi are markedly narrowed. 3. Tiny new right effusion. 4. Stable bilateral parenchymal scarring. 5. Large chronic hiatal hernia. 6.  Aortic Atherosclerosis (ICD10-I70.0). Aortic Atherosclerosis (ICD10-I70.0). Electronically Signed   By: Lorriane Shire M.D.   On: 07/03/2019 13:05     EKG: Independently reviewed.  Sinus rhythm, QTC 486, incomplete right bundle blockade, early R wave progression  Assessment/Plan Principal Problem:   COPD exacerbation (HCC) Active Problems:   HTN (hypertension)   HLD (hyperlipidemia)   Dementia (HCC)   Type II diabetes mellitus with renal manifestations (HCC)   Elevated troponin   Acute on chronic respiratory failure with hypoxia (HCC)   Acute renal failure superimposed on stage 3a chronic  kidney disease (HCC)   Acute on chronic respiratory failure with hypoxia due to COPD exacerbation [facility]): Patient has wheezing and rhonchi on auscultation, indicating COPD exacerbation.  Patient also has 1+ leg edema and elevated BNP 1303, indicating possible undiagnosed CHF.  Will give 20 mg of Lasix now and follow-up 2D echo  - will admit to progressive unit as inpatient -Bronchodilators -Solu-Medrol 40 mg IV bid -Z pak  -Mucinex for cough  -Incentive spirometry -Follow up  sputum culture -Nasal cannula oxygen as needed to maintain O2 saturation 93% or greater -2D echo  HTN:  -Continue home medications: Amlodipine, metoprolol -Hold lisinopril due to worsening renal function -hydralazine prn  HLD (hyperlipidemia) -lipitor  Dementia (HCC) -Namzaric  Type II diabetes mellitus with renal manifestations ([place]): Most recent A1c 6.6, well controled. Patient is taking Metformin and Lantus at home -will decrease Lantus dose from 10 to 7  -SSI  Elevated troponin: Trop 50 -->53. No CP.  [place] due to demand ischemia -Trend troponin - Aspirin, Lipitor -Check A1c, FLP -Repeat EKG in the morning -Follow-up 2D echo  Acute renal failure superimposed on stage 3a chronic kidney disease ([place]): Baseline Cre is 1.1-1.2, pt's Cre is 1.61 and BUN 33 on admission.  Possible cardiorenal syndrome due to possible undiagnosed CHF and continuation of lisinopril - Follow up renal function by BMP - Avoid using renal toxic medications, hypotension and contrast dye (or carefully use) - Hold lisinopril      DVT ppx: SQ Lovenox Code Status: Full code per her son Family Communication: Yes, patient's son by phone Disposition Plan:  Anticipate discharge back to previous SNF environment Consults called:  none Admission status:   progressive unit as inpt      Status is: Inpatient  Remains inpatient appropriate because:Inpatient level of care appropriate due to severity of illness.  Patient has  multiple comorbidities, now presents with acute on chronic respiratory failure due to COPD exacerbation.  Patient may also have undiagnosed CHF.  Her presentation is highly complicated.  Patient is at high risk of deteriorating.  Need to be treated in hospital for at least 2 days   Dispo: The patient is from: SNF              Anticipated d/c is to: SNF              Anticipated d/c date is: 2 days              Patient currently is not medically stable to d/c.           Date of Service 07/03/2019    Ivor Costa [facility]   If 7PM-7AM, please contact night-coverage [email or web address] 07/03/2019, 6:13 PM

## 2019-07-03 NOTE — ED Triage Notes (Signed)
Pt arrives [facility] for SOB. Pt does NOT wear oxygen. Pt confused. Pt placed on 4L [place] by EMS. Continued at this time. Pt tachy in triage. Wheezing noted. Pt doesn't know when symptoms. Denies cough or fever. Arrives with 20g IV to L FA.

## 2019-07-03 NOTE — ED Notes (Signed)
Pt soiled of urine. Pt brief changed and pad placed under the pt. Purewick placed on pt.

## 2019-07-03 NOTE — Progress Notes (Signed)
Left voicemail with son- Donnis Pecha [phone] to call 2A Nursing unit regarding his mother's admission. Patient with dementia and review of history is very limited. Patient admitted from ED to room 242. HR has been 120s-130s since arrival to ED. Interventions have included IV push cardizem with limited improvement. Patient does not appear to have any acute distress at this time. On 4L oxygen acute to maintain sats, pt oxygen was in 80s on EMS arrival at skilled nursing facility. Will continue to monitor.

## 2019-07-03 NOTE — ED Provider Notes (Signed)
ER Provider Note       Time seen: 11:00 AM    I have reviewed the vital signs and the nursing notes.  HISTORY   Chief Complaint Shortness of Breath    HPI Cheryl [name] is a 79 y.o. female with a history of COPD, dementia, diabetes, GERD, hyperlipidemia, hypertension, lung cancer, schizophrenia who presents today for shortness of breath. Patient does not wear oxygen and arrives confused. She was placed on 4 L nasal cannula by EMS. She presents tachycardic and tachypnea. Does not know how long the symptoms have been going on.  Past Medical History:  Diagnosis Date  . COPD (chronic obstructive pulmonary disease) ([place])   . Dementia ([place])   . Diabetes mellitus without complication ([place])   . GERD (gastroesophageal reflux disease)   . Hyperlipidemia   . Hypertension   . Lung cancer ([place])   . Mental retardation   . Schizophrenia [facility])     Past Surgical History:  Procedure Laterality Date  . ABDOMINAL HYSTERECTOMY      Allergies Patient has no known allergies.  Review of Systems Constitutional: Negative for fever. Cardiovascular: Negative for chest pain. Respiratory: Positive shortness of breath Gastrointestinal: Negative for abdominal pain, vomiting and diarrhea. Musculoskeletal: Negative for back pain. Skin: Negative for rash. Neurological: Negative for headaches, focal weakness or numbness.  All systems negative/normal/unremarkable except as stated in the HPI  ____________________________________________   PHYSICAL EXAM:  VITAL SIGNS: Vitals:   07/03/19 0937  BP: (!) 150/118  Pulse: (!) 133  Resp: (!) 24  Temp: 98.8 F (37.1 C)  SpO2: 100%    Constitutional: Alert and oriented. Mild distress Eyes: Conjunctivae are normal. Normal extraocular movements. ENT      Head: Normocephalic and atraumatic.      Nose: No congestion/rhinnorhea.      Mouth/Throat: Mucous membranes are moist.      Neck: No stridor. Cardiovascular: Rapid rate, regular rhythm.  No murmurs, rubs, or gallops. Respiratory: Tachypnea with wheezing and rhonchi bilaterally Gastrointestinal: Soft and nontender. Normal bowel sounds Musculoskeletal: Nontender with normal range of motion in extremities. No lower extremity tenderness nor edema. Neurologic:  Normal speech and language. No gross focal neurologic deficits are appreciated.  Skin:  Skin is warm, dry and intact. No rash noted. Psychiatric: Speech and behavior are normal.  ____________________________________________  EKG: Interpreted by me. Sinus tachycardia with a rate of 134 bpm, incomplete right bundle branch block, nonspecific ST segment changes  ____________________________________________   LABS (pertinent positives/negatives)  Labs Reviewed  BASIC METABOLIC PANEL - Abnormal; Notable for the following components:      Result Value   Glucose, Bld 229 (*)    BUN 33 (*)    Creatinine, Ser 1.61 (*)    Calcium 8.8 (*)    GFR calc non Af Amer 31 (*)    GFR calc Af Amer 35 (*)    All other components within normal limits  CBC - Abnormal; Notable for the following components:   Hemoglobin 10.5 (*)    HCT 35.6 (*)    MCHC 29.5 (*)    RDW 16.8 (*)    nRBC 0.4 (*)    All other components within normal limits  BLOOD GAS, VENOUS - Abnormal; Notable for the following components:   pO2, Ven 173.0 (*)    All other components within normal limits  BRAIN NATRIURETIC PEPTIDE - Abnormal; Notable for the following components:   B Natriuretic Peptide 1,303.1 (*)    All other components within normal  limits  TROPONIN I (HIGH SENSITIVITY) - Abnormal; Notable for the following components:   Troponin I (High Sensitivity) 50 (*)    All other components within normal limits  TROPONIN I (HIGH SENSITIVITY) - Abnormal; Notable for the following components:   Troponin I (High Sensitivity) 53 (*)    All other components within normal limits  SARS CORONAVIRUS 2 BY RT PCR (HOSPITAL ORDER, [place]  [facility])  TROPONIN I (HIGH SENSITIVITY)   CRITICAL CARE Performed by: Laurence Aly   Total critical care time: 30 minutes  Critical care time was exclusive of separately billable procedures and treating other patients.  Critical care was necessary to treat or prevent imminent or life-threatening deterioration.  Critical care was time spent personally by me on the following activities: development of treatment plan with patient and/or surrogate as well as nursing, discussions with consultants, evaluation of patient's response to treatment, examination of patient, obtaining history from patient or surrogate, ordering and performing treatments and interventions, ordering and review of laboratory studies, ordering and review of radiographic studies, pulse oximetry and re-evaluation of patient's condition.  RADIOLOGY  Images were viewed by me Chest x-ray IMPRESSION: 1.  No acute findings.  2. Mild stable cardiomegaly. Stable scarring over the left hilar region in this patient with history of lung cancer post radiation. CTA IMPRESSION: 1. No pulmonary emboli. 2. Bronchomalacia of mainstem bronchi bilaterally. Both mainstem bronchi are markedly narrowed. 3. Tiny new right effusion. 4. Stable bilateral parenchymal scarring. 5. Large chronic hiatal hernia. 6.  Aortic Atherosclerosis (ICD10-I70.0).  Aortic Atherosclerosis (ICD10-I70.0).  DIFFERENTIAL DIAGNOSIS  CHF, COPD, pneumonia, hypoxia, COVID-19  ASSESSMENT AND PLAN  Dyspnea, tachycardia   Plan: The patient had presented for dyspnea.  She remained on oxygen on arrival.  Patient's labs seem to indicate a degree of congestive heart failure with a chronically elevated troponin.  Unclear etiology for her dyspnea and tachycardia as her CT was grossly unremarkable.  I have ordered nebs and steroids for her, she may need additional Lasix as well.  I will discuss with the hospitalist for admission.  Lenise Arena MD     Note: This note was generated in part or whole with voice recognition software. Voice recognition is usually quite accurate but there are transcription errors that can and very often do occur. I apologize for any typographical errors that were not detected and corrected.     Earleen Newport, MD 07/03/19 1346

## 2019-07-04 ENCOUNTER — Inpatient Hospital Stay (HOSPITAL_COMMUNITY)
Admit: 2019-07-04 | Discharge: 2019-07-04 | Disposition: A | Discharge status: Home / Self Care | Payer: Medicare Other | Attending: Internal Medicine | Admitting: Internal Medicine

## 2019-07-04 DIAGNOSIS — F0151 Vascular dementia with behavioral disturbance: Secondary | ICD-10-CM

## 2019-07-04 DIAGNOSIS — I484 Atypical atrial flutter: Secondary | ICD-10-CM

## 2019-07-04 DIAGNOSIS — I5021 Acute systolic (congestive) heart failure: Secondary | ICD-10-CM

## 2019-07-04 DIAGNOSIS — I5031 Acute diastolic (congestive) heart failure: Secondary | ICD-10-CM

## 2019-07-04 DIAGNOSIS — R0600 Dyspnea, unspecified: Secondary | ICD-10-CM

## 2019-07-04 LAB — BLOOD GAS, ARTERIAL
Acid-Base Excess: 0.6 mmol/L (ref 0.0–2.0)
Bicarbonate: 27 mmol/L (ref 20.0–28.0)
FIO2: 0.21
O2 Saturation: 88.9 %
Patient temperature: 37
pCO2 arterial: 50 mmHg — ABNORMAL HIGH (ref 32.0–48.0)
pH, Arterial: 7.34 — ABNORMAL LOW (ref 7.350–7.450)
pO2, Arterial: 60 mmHg — ABNORMAL LOW (ref 83.0–108.0)

## 2019-07-04 LAB — GLUCOSE, CAPILLARY
Glucose-Capillary: 137 mg/dL — ABNORMAL HIGH (ref 70–99)
Glucose-Capillary: 154 mg/dL — ABNORMAL HIGH (ref 70–99)
Glucose-Capillary: 158 mg/dL — ABNORMAL HIGH (ref 70–99)
Glucose-Capillary: 220 mg/dL — ABNORMAL HIGH (ref 70–99)
Glucose-Capillary: 239 mg/dL — ABNORMAL HIGH (ref 70–99)
Glucose-Capillary: 247 mg/dL — ABNORMAL HIGH (ref 70–99)

## 2019-07-04 LAB — BASIC METABOLIC PANEL
Anion gap: 7 (ref 5–15)
BUN: 38 mg/dL — ABNORMAL HIGH (ref 8–23)
CO2: 27 mmol/L (ref 22–32)
Calcium: 8.7 mg/dL — ABNORMAL LOW (ref 8.9–10.3)
Chloride: 108 mmol/L (ref 98–111)
Creatinine, Ser: 1.5 mg/dL — ABNORMAL HIGH (ref 0.44–1.00)
GFR calc Af Amer: 39 mL/min — ABNORMAL LOW (ref >60)
GFR calc non Af Amer: 33 mL/min — ABNORMAL LOW (ref >60)
Glucose, Bld: 172 mg/dL — ABNORMAL HIGH (ref 70–99)
Potassium: 4.1 mmol/L (ref 3.5–5.1)
Sodium: 142 mmol/L (ref 135–145)

## 2019-07-04 LAB — LIPID PANEL
Cholesterol: 157 mg/dL (ref 0–200)
HDL: 66 mg/dL (ref >40)
LDL Cholesterol: 80 mg/dL (ref 0–99)
Total CHOL/HDL Ratio: 2.4 RATIO
Triglycerides: 55 mg/dL (ref <150)
VLDL: 11 mg/dL (ref 0–40)

## 2019-07-04 LAB — CBC
HCT: 32.7 % — ABNORMAL LOW (ref 36.0–46.0)
Hemoglobin: 9.9 g/dL — ABNORMAL LOW (ref 12.0–15.0)
MCH: 26.8 pg (ref 26.0–34.0)
MCHC: 30.3 g/dL (ref 30.0–36.0)
MCV: 88.6 fL (ref 80.0–100.0)
Platelets: 378 10*3/uL (ref 150–400)
RBC: 3.69 MIL/uL — ABNORMAL LOW (ref 3.87–5.11)
RDW: 16.4 % — ABNORMAL HIGH (ref 11.5–15.5)
WBC: 6.5 10*3/uL (ref 4.0–10.5)
nRBC: 0.8 % — ABNORMAL HIGH (ref 0.0–0.2)

## 2019-07-04 LAB — HEMOGLOBIN A1C
Hgb A1c MFr Bld: 6.7 % — ABNORMAL HIGH (ref 4.8–5.6)
Mean Plasma Glucose: 146 mg/dL

## 2019-07-04 LAB — ECHOCARDIOGRAM COMPLETE
Height: 64 in
Weight: 3292.8 oz

## 2019-07-04 LAB — MRSA PCR SCREENING: MRSA by PCR: NEGATIVE

## 2019-07-04 MED ORDER — FUROSEMIDE 10 MG/ML IJ SOLN
20.0000 mg | Freq: Every day | INTRAMUSCULAR | Status: DC
  Administered 2019-07-04: 20 mg via INTRAVENOUS
  Filled 2019-07-04: qty 2

## 2019-07-04 MED ORDER — HEPARIN (PORCINE) 25000 UT/250ML-% IV SOLN
900.0000 [IU]/h | INTRAVENOUS | Status: DC
  Administered 2019-07-04: 1150 [IU]/h via INTRAVENOUS
  Filled 2019-07-04: qty 250

## 2019-07-04 MED ORDER — LEVALBUTEROL HCL 0.63 MG/3ML IN NEBU
0.6300 mg | INHALATION_SOLUTION | Freq: Four times a day (QID) | RESPIRATORY_TRACT | Status: DC
  Administered 2019-07-04 – 2019-07-05 (×4): 0.63 mg via RESPIRATORY_TRACT
  Filled 2019-07-04 (×5): qty 3

## 2019-07-04 MED ORDER — FUROSEMIDE 20 MG PO TABS: 20.0000 mg | ORAL_TABLET | Freq: Every day | ORAL | Status: DC

## 2019-07-04 MED ORDER — IPRATROPIUM-ALBUTEROL 0.5-2.5 (3) MG/3ML IN SOLN
3.0000 mL | Freq: Four times a day (QID) | RESPIRATORY_TRACT | Status: DC
  Administered 2019-07-04: 3 mL via RESPIRATORY_TRACT
  Filled 2019-07-04: qty 3

## 2019-07-04 MED ORDER — AMIODARONE LOAD VIA INFUSION
150.0000 mg | Freq: Once | INTRAVENOUS | Status: AC
  Administered 2019-07-04: 150 mg via INTRAVENOUS
  Filled 2019-07-04: qty 83.34

## 2019-07-04 MED ORDER — METOPROLOL TARTRATE 50 MG PO TABS
50.0000 mg | ORAL_TABLET | Freq: Two times a day (BID) | ORAL | Status: DC
  Administered 2019-07-04 – 2019-07-05 (×2): 50 mg via ORAL
  Filled 2019-07-04 (×2): qty 1

## 2019-07-04 MED ORDER — AMIODARONE HCL IN DEXTROSE 360-4.14 MG/200ML-% IV SOLN
30.0000 mg/h | INTRAVENOUS | Status: DC
  Administered 2019-07-04 – 2019-07-05 (×3): 30 mg/h via INTRAVENOUS
  Filled 2019-07-04 (×2): qty 200

## 2019-07-04 MED ORDER — AMIODARONE HCL IN DEXTROSE 360-4.14 MG/200ML-% IV SOLN
60.0000 mg/h | INTRAVENOUS | Status: DC
  Administered 2019-07-04: 60 mg/h via INTRAVENOUS
  Filled 2019-07-04 (×2): qty 200

## 2019-07-04 MED ORDER — DILTIAZEM HCL 30 MG PO TABS
30.0000 mg | ORAL_TABLET | Freq: Four times a day (QID) | ORAL | Status: DC
  Administered 2019-07-04: 30 mg via ORAL
  Filled 2019-07-04: qty 1

## 2019-07-04 MED ORDER — HEPARIN BOLUS VIA INFUSION
4500.0000 [IU] | Freq: Once | INTRAVENOUS | Status: AC
  Administered 2019-07-04: 4500 [IU] via INTRAVENOUS
  Filled 2019-07-04: qty 4500

## 2019-07-04 NOTE — Progress Notes (Signed)
*  PRELIMINARY RESULTS* Echocardiogram 2D Echocardiogram has been performed.  Cheryl [name] Cheryl [name] 07/04/2019, 10:46 AM

## 2019-07-04 NOTE — Progress Notes (Addendum)
2.5mg  PRN metoprolol tartrate IV push given for heart rate sustaining above 125 despite medication changes. Will continue to monitor.   Update 1545: MD updated on patient's heart rate. No changes in heart rate after PRN medication and patient in atrial flutter.   Update 1550: Cardiology at bedside.

## 2019-07-04 NOTE — Progress Notes (Signed)
Consulted to replace two I.V.'s started at 6 pm this evening due to patient pulling them out. Patient appears somewhat confused. Mittens in place at this time. Patient is a difficult stick may require restraints.

## 2019-07-04 NOTE — Progress Notes (Signed)
Nutrition Brief Note  Patient identified on the Malnutrition Screening Tool (MST) Report  Met with patient at bedside this afternoon. She reports good appetite and intake at home, recalls 2-3 meals and likes to snack. Patient reports good po intake of meals during admission.   Wt Readings from Last 15 Encounters:  07/04/19 93.4 kg  05/16/19 92.2 kg  10/19/17 87.9 kg  07/20/17 86.6 kg  04/30/17 88 kg  10/13/16 86.1 kg  05/04/16 86.2 kg  04/07/16 86 kg  01/17/16 84.8 kg  01/10/16 84.5 kg  01/05/16 76.2 kg  09/07/15 83.3 kg  02/07/15 84.2 kg  08/23/14 84.3 kg    Body mass index is 35.33 kg/m. Patient meets criteria for obese based on current BMI.   Current diet order is HH/CM/1500, patient is consuming approximately 100% of meals at this time. Labs and medications reviewed.   No nutrition interventions warranted at this time. If nutrition issues arise, please consult RD.   Lajuan Lines, RD, LDN Clinical Nutrition After Hours/Weekend Pager # in [phone]

## 2019-07-04 NOTE — Progress Notes (Signed)
ANTICOAGULATION CONSULT NOTE - Initial Consult  Pharmacy Consult for Heparin Drip Indication: atrial fibrillation  No Known Allergies  Patient Measurements: Height: 5\' 4"  (162.6 cm) Weight: 93.4 kg (205 lb 12.8 oz) IBW/kg (Calculated) : 54.7 Heparin Dosing Weight: 75.8 kg  Vital Signs: Temp: 98.6 F (37 C) (06/21 1657) Temp Source: Oral (06/21 1657) BP: 119/80 (06/21 1203) Pulse Rate: 122 (06/21 1203)  Labs: Recent Labs    07/03/19 0947 07/03/19 0947 07/03/19 1147 07/03/19 1526 07/03/19 1918 07/04/19 0659  HGB 10.5*  --   --   --   --  9.9*  HCT 35.6*  --   --   --   --  32.7*  PLT 357  --   --   --   --  378  CREATININE 1.61*  --   --   --   --  1.50*  TROPONINIHS 50*   < > 53* 42* 43*  --    < > = values in this interval not displayed.    Estimated Creatinine Clearance: 34.8 mL/min (A) (by C-G formula based on SCr of 1.5 mg/dL (H)).   Medical History: Past Medical History:  Diagnosis Date  . COPD (chronic obstructive pulmonary disease) ([place])   . Dementia ([place])   . Diabetes mellitus without complication ([place])   . GERD (gastroesophageal reflux disease)   . Hyperlipidemia   . Hypertension   . Lung cancer ([place])   . Mental retardation   . Schizophrenia [facility])     Assessment: Patient is a 79 yo female, pharmacy has been consulted to manage Heparin drip for afib. Patient was previously on Enoxaparin 40mg  SQ q24h with last dose received on 6/20 ~22:00.  Goal of Therapy:  Heparin level 0.3-0.7 units/ml Monitor platelets by anticoagulation protocol: Yes   Plan:  Give 4500 units bolus x 1 Start heparin infusion at 1150 units/hr Check anti-Xa level in 8 hours and daily while on heparin Continue to monitor H&H and platelets  Paulina Fusi, PharmD, BCPS 07/04/2019 5:14 PM

## 2019-07-04 NOTE — Progress Notes (Signed)
Pt unable to perform I.S. due to somnolence. Will need instructions when more alert.

## 2019-07-04 NOTE — Progress Notes (Signed)
Spoke with caregiver from [facility] [place]. Contact information  [phone]. Ashly gave me an update and history on patient . She also gave me her boss's information for any questions or concerns since we were not able to get in contact with son Nelia Rogoff. Elizebeth Koller [phone].   Per caregiver patient was on CPAP at night that was recently discontinued for an unknown reason and patient did get short of breath often on ambulation at the facility. Per caregiver patient still did walk with a walker. I informed caregiver I would relay this information to the MD. Patient only alert to self.

## 2019-07-04 NOTE — Consult Note (Signed)
Cardiology Consultation:   Patient ID: Cheryl [name]; [id number]; [birth year]   Admit date: 07/03/2019 Date of Consult: 07/04/2019  Primary Care Provider: Letta Median, MD Primary Cardiologist: New to [facility] - consult by Arida Primary Electrophysiologist:  None   Patient Profile:   Cheryl [name] is a 79 y.o. female with a hx of DM2, HTN, HLD, cognitive impairment/dementia, COPD, CKD stage III, lung cancer s/p radiation therapy, iron deficiency anemia, depression, anxiety, and GERD who is being seen today for the evaluation of acute systolic CHF at the request of Dr. Tana Coast.  History of Present Illness:   [name] has no previously known cardiac history. She was recently admitted in late 04/2019 with acute hypoxic respiratory distress requiring BiPAP in the setting of AECOPD. CTA chest was negative for PE that admission. EKGs showed sinus rhythm to sinus tachycardia, RBBB, PVCs. She was treated with nebs, steroids, and antibiotics.   She returned to the hospital on 07/03/2019 with a several day history of worsening dyspnea requiring supplemental oxygen via nasal cannula at 4 L in the ED. EKG showed atrial flutter with RVR, 134 bpm, incomplete RBBB. CTA chest was again negative for PE with bronchomalacia of the mainstem bronchi bilaterally, tiny right pleural effusion. CXR with no acute findings with stable scarring of the left hilar region. HS-Tn 50 with a delta and peak of 53. BNP 1303 (prior 224 one month ago). Covid negative. WBC 7.10-->6.5, HGB 10.5-->9.9, potassium 3.7-->4.1, BUN/SCr 33/1.61-->38/1.50. In the ED, she was given IV Lasix 20 mg, along with nebs, steroids, and antibiotics. Since admission, she has remained tachycardic with ventricular rates around 126 bpm. Review of telemetry shows atrial flutter with RVR with ventricular rates around 126 bpm.   History is taken from prior notes given patient's underlying dementia.    Past Medical History:  Diagnosis Date  . COPD  (chronic obstructive pulmonary disease) ([place])   . Dementia ([place])   . Diabetes mellitus without complication ([place])   . GERD (gastroesophageal reflux disease)   . Hyperlipidemia   . Hypertension   . Lung cancer ([place])   . Mental retardation   . Schizophrenia [facility])     Past Surgical History:  Procedure Laterality Date  . ABDOMINAL HYSTERECTOMY       Home Meds: Prior to Admission medications   Medication Sig Start Date End Date Taking? Authorizing Provider  acetaminophen (TYLENOL) 325 MG tablet Take 650 mg by mouth every 4 (four) hours as needed for mild pain or fever.    Yes [provider]  ALPRAZolam (XANAX) 0.25 MG tablet Take 1 tablet (0.25 mg total) by mouth 2 (two) times daily. 05/15/19  Yes Swayze, Ava, DO  aluminum-magnesium hydroxide-simethicone (MAALOX) 329-924-26 MG/5ML SUSP Take 30 mLs by mouth daily as needed (indegestion or heartburn).    Yes [provider]  amLODipine (NORVASC) 10 MG tablet Take 10 mg by mouth daily.    Yes [provider]  aspirin 325 MG tablet Take 325 mg by mouth daily.    Yes [provider]  atorvastatin (LIPITOR) 20 MG tablet Take 20 mg by mouth at bedtime.    Yes [provider]  bismuth subsalicylate (PEPTO BISMOL) 262 MG/15ML suspension Take 15 mLs by mouth every 2 (two) hours as needed for indigestion or diarrhea or loose stools. (max 6 doses in 24 hours)   Yes [provider]  ferrous sulfate 325 (65 FE) MG tablet Take 325 mg by mouth 2 (two) times daily with a  meal.    Yes [provider]  fluticasone (FLONASE) 50 MCG/ACT nasal spray Place 2 sprays into both nostrils daily.   Yes [provider]  Fluticasone-Salmeterol (ADVAIR DISKUS) 250-50 MCG/DOSE AEPB Inhale 1 puff into the lungs 2 (two) times daily.   Yes [provider]  insulin glargine (LANTUS) 100 UNIT/ML injection Inject 0.1 mLs (10 Units total) into the skin daily. 05/17/19  Yes Bonnell Public Tublu, MD    lisinopril (PRINIVIL,ZESTRIL) 10 MG tablet Take 10 mg by mouth daily.    Yes [provider]  magnesium hydroxide (MILK OF MAGNESIA) 400 MG/5ML suspension Take 30 mLs by mouth daily as needed for moderate constipation.    Yes [provider]  Memantine HCl-Donepezil HCl (NAMZARIC) 28-10 MG CP24 Take 1 capsule by mouth daily. 05/15/19  Yes Swayze, Ava, DO  metFORMIN (GLUCOPHAGE) 500 MG tablet Take 500 mg by mouth 2 (two) times daily with a meal.    Yes [provider]  metoprolol tartrate (LOPRESSOR) 25 MG tablet Take 1 tablet (25 mg total) by mouth 2 (two) times daily. 05/15/19  Yes Swayze, Ava, DO  modafinil (PROVIGIL) 100 MG tablet Take 1 tablet (100 mg total) by mouth daily. 05/15/19  Yes Swayze, Ava, DO  omeprazole (PRILOSEC) 40 MG capsule Take 40 mg by mouth daily.    Yes [provider]  perphenazine (TRILAFON) 4 MG tablet Take 1 tablet (4 mg total) by mouth 3 (three) times daily. 05/15/19  Yes Swayze, Ava, DO  Phenylephrine-DM-GG ([employer] TUSSIN COUGH/COLD CF) 5-10-100 MG/5ML LIQD Take 10 mLs by mouth every 6 (six) hours as needed (cough).   Yes [provider]  QUEtiapine (SEROQUEL) 200 MG tablet Take 200 mg by mouth at bedtime.   Yes [provider]    Inpatient Medications: Scheduled Meds: . ALPRAZolam  0.25 mg Oral BID  . aspirin  325 mg Oral Daily  . atorvastatin  20 mg Oral QHS  . azithromycin  250 mg Oral Daily  . dextromethorphan-guaiFENesin  1 tablet Oral BID  . diltiazem  30 mg Oral Q6H  . memantine  28 mg Oral Daily   And  . donepezil  10 mg Oral QHS  . enoxaparin (LOVENOX) injection  40 mg Subcutaneous Q24H  . ferrous sulfate  325 mg Oral BID WC  . fluticasone  2 spray Each Nare Daily  . furosemide  20 mg Intravenous Daily  . insulin aspart  0-9 Units Subcutaneous Q4H  . insulin glargine  7 Units Subcutaneous Daily  . levalbuterol  0.63 mg Nebulization Q6H  . methylPREDNISolone (SOLU-MEDROL) injection  40 mg Intravenous Q12H   . metoprolol tartrate  25 mg Oral BID  . modafinil  100 mg Oral Daily  . mometasone-formoterol  2 puff Inhalation BID  . pantoprazole  40 mg Oral Daily  . perphenazine  4 mg Oral TID  . QUEtiapine  200 mg Oral QHS   Continuous Infusions:  PRN Meds: acetaminophen, albuterol, alum & mag hydroxide-simeth, hydrALAZINE, magnesium hydroxide, metoprolol tartrate, ondansetron (ZOFRAN) IV  Allergies:  No Known Allergies  Social History:   Social History   Socioeconomic History  . Marital status: Single    Spouse name: Not on file  . Number of children: Not on file  . Years of education: Not on file  . Highest education level: Not on file  Occupational History  . Not on file  Tobacco Use  . Smoking status: Former Smoker    Packs/day: 0.25    Types:  Cigarettes    Quit date: 08/30/2016    Years since quitting: 2.8  . Smokeless tobacco: Never Used  Vaping Use  . Vaping Use: Never used  Substance and Sexual Activity  . Alcohol use: No  . Drug use: No  . Sexual activity: Never  Other Topics Concern  . Not on file  Social History Narrative  . Not on file   Social Determinants of Health   Financial Resource Strain:   . Difficulty of Paying Living Expenses:   Food Insecurity:   . Worried About Charity fundraiser in the Last Year:   . Arboriculturist in the Last Year:   Transportation Needs:   . Film/video editor (Medical):   Marland Kitchen Lack of Transportation (Non-Medical):   Physical Activity:   . Days of Exercise per Week:   . Minutes of Exercise per Session:   Stress:   . Feeling of Stress :   Social Connections:   . Frequency of Communication with Friends and Family:   . Frequency of Social Gatherings with Friends and Family:   . Attends Religious Services:   . Active Member of Clubs or Organizations:   . Attends Archivist Meetings:   Marland Kitchen Marital Status:   Intimate Partner Violence:   . Fear of Current or Ex-Partner:   . Emotionally Abused:   Marland Kitchen Physically  Abused:   . Sexually Abused:      Family History:   Family History  Problem Relation Age of Onset  . Breast cancer Neg Hx     ROS:  Review of Systems  Unable to perform ROS: Dementia      Physical Exam/Data:   Vitals:   07/04/19 0805 07/04/19 0844 07/04/19 1203 07/04/19 1403  BP:  134/87 119/80   Pulse:  (!) 124 (!) 122   Resp:  19 18   Temp:   99 F (37.2 C)   TempSrc:   Oral   SpO2: 95% 100% 95% 97%  Weight:      Height:        Intake/Output Summary (Last 24 hours) at 07/04/2019 1522 Last data filed at 07/03/2019 2332 Gross per 24 hour  Intake --  Output 650 ml  Net -650 ml   Filed Weights   07/03/19 0938 07/03/19 1830 07/04/19 0451  Weight: 81.6 kg 93.2 kg 93.4 kg   Body mass index is 35.33 kg/m.   Physical Exam: General: Well developed, well nourished, in no acute distress. Head: Normocephalic, atraumatic, sclera non-icteric, no xanthomas, nares without discharge.  Neck: Negative for carotid bruits. JVD difficult to assess. Lungs: Diminished and coarse breath sounds bilaterally. Breathing is unlabored. Heart: Tachycardic with S1 S2. No murmurs, rubs, or gallops appreciated. Abdomen: Soft, non-tender, non-distended with normoactive bowel sounds. No hepatomegaly. No rebound/guarding. No obvious abdominal masses. Msk:  Strength and tone appear normal for age. Extremities: No clubbing or cyanosis. No edema. Distal pedal pulses are 2+ and equal bilaterally. Neuro: Alert and not oriented. No facial asymmetry. No focal deficit. Moves all extremities spontaneously. Psych:  Responds to questions with a normal affect.   EKG:  The EKG was personally reviewed and demonstrates: atrial flutter with RVR, 134 bpm, incomplete RBBB Telemetry:  Telemetry was personally reviewed and demonstrates: atrial flutter with RVR with ventricular rates around 126 bpm  Weights: Filed Weights   07/03/19 0938 07/03/19 1830 07/04/19 0451  Weight: 81.6 kg 93.2 kg 93.4 kg    Relevant  CV Studies:  2D echo  07/04/2019: 1. Left ventricular ejection fraction, by estimation, is 30 to 35%. The  left ventricle has moderately decreased function. The left ventricle  demonstrates global hypokinesis. There is mild left ventricular  hypertrophy. Left ventricular diastolic  parameters are indeterminate.  2. Right ventricular systolic function is mildly reduced. The right  ventricular size is mildly enlarged. There is mildly elevated pulmonary  artery systolic pressure.  3. Left atrial size was mildly dilated.  4. Right atrial size was moderately dilated.  5. The mitral valve is normal in structure. Trivial mitral valve  regurgitation. No evidence of mitral stenosis.  6. Tricuspid valve regurgitation is moderate.  7. The aortic valve is normal in structure. Aortic valve regurgitation is  trivial. Mild aortic valve sclerosis is present, with no evidence of  aortic valve stenosis.  8. Moderately dilated pulmonary artery.  Laboratory Data:  Chemistry Recent Labs  Lab 07/03/19 0947 07/04/19 0659  NA 145 142  K 3.7 4.1  CL 111 108  CO2 24 27  GLUCOSE 229* 172*  BUN 33* 38*  CREATININE 1.61* 1.50*  CALCIUM 8.8* 8.7*  GFRNONAA 31* 33*  GFRAA 35* 39*  ANIONGAP 10 7    No results for input(s): PROT, ALBUMIN, AST, ALT, ALKPHOS, BILITOT in the last 168 hours. Hematology Recent Labs  Lab 07/03/19 0947 07/04/19 0659  WBC 7.1 6.5  RBC 3.89 3.69*  HGB 10.5* 9.9*  HCT 35.6* 32.7*  MCV 91.5 88.6  MCH 27.0 26.8  MCHC 29.5* 30.3  RDW 16.8* 16.4*  PLT 357 378   Cardiac EnzymesNo results for input(s): TROPONINI in the last 168 hours. No results for input(s): TROPIPOC in the last 168 hours.  BNP Recent Labs  Lab 07/03/19 0947  BNP 1,303.1*    DDimer No results for input(s): DDIMER in the last 168 hours.  Radiology/Studies:  DG Chest 2 View  Result Date: 07/03/2019 IMPRESSION: 1.  No acute findings. 2. Mild stable cardiomegaly. Stable scarring over the left  hilar region in this patient with history of lung cancer post radiation. Electronically Signed   By: Marin Olp M.D.   On: 07/03/2019 11:01   CT Angio Chest PE W and/or Wo Contrast  Result Date: 07/03/2019 IMPRESSION: 1. No pulmonary emboli. 2. Bronchomalacia of mainstem bronchi bilaterally. Both mainstem bronchi are markedly narrowed. 3. Tiny new right effusion. 4. Stable bilateral parenchymal scarring. 5. Large chronic hiatal hernia. 6.  Aortic Atherosclerosis (ICD10-I70.0). Aortic Atherosclerosis (ICD10-I70.0). Electronically Signed   By: Lorriane Shire M.D.   On: 07/03/2019 13:05    Assessment and Plan:   1. Acute HFrEF/pulmonary hypertension: -Possibly tachy-mediated in the setting of atrial flutter with RVR -Echo this admission with reduced LVSF with an EF of 30-35% and a PASP of 44.8 mmHg -Gentle IV diuresis with acute on CKD, consider escalation   -Rate control atrial flutter as below -It is unclear how/if she is compliant with CPAP  2. New onset atrial flutter with RVR: -Asymptomatic  -Uncertain duration as she presented in this rhythm  -She remains in atrial flutter with RVR ventricular rates in the 120s bpm despite diltiazem 30 mg q 6 hours and Lopressor 25 mg bid -Overall, this is somewhat of a difficult situation given her underlying dementia/cognitive impairment  -Uncertain if she would tolerate a TEE-guided DCCV -There is not room for escalation of diltiazem or metoprolol at this time, in this setting, we will place her on amiodarone gtt -CHADS2VASc at least 7 (CHF, HTN, age x 2, DM2, vascular disease, gender) -  Start heparin gtt -Her son, Danee Soller, is her legal guardian [phone]) [phone],we will need to reach out to him  3. Acute hypoxic respiratory distress: -Multifactorial including AECOPD, acute HFrEF, and atrial flutter with RVR -Currently on room air  4. AECOPD: -Nebs, steroids, antibiotics per IM -Albuterol has been changed to Xopenex given atrial flutter    5. Elevated HS-Tn: -Minimally elevated and not consistent with ACS -Supply demand ischemia in the setting of the above -With her underlying dementia, it remains uncertain if she would be a cath candidate   6. Anemia: -Monitor, down ~ 2 grams from 05/2019 admission  7. Acute on CKD stage III: -Improving with diuresis  -Monitor   8. Cognitive impairment/dementia: -Stable -Check LFT to assess albumin   9. OSA: -CPAP   For questions or updates, please contact Anton Please consult [email or web address] for contact info under Cardiology/STEMI.   Signed, Christell Faith, PA-C [place] Pager: [phone] 07/04/2019, 3:22 PM

## 2019-07-04 NOTE — Progress Notes (Addendum)
[facility]                                                                              Patient Demographics  Cheryl [name], is a 79 y.o. female, DOB - 04-30-1940, DJM:[id number]  Admit date - 07/03/2019   Admitting Physician Ivor Costa, MD  Outpatient Primary MD for the patient is Letta Median, MD  Outpatient specialists:   LOS - 1  days   Medical records reviewed and are as summarized below:    Chief Complaint  Patient presents with  . Shortness of Breath       Brief summary   Cheryl [name] is a 79 y.o. female with medical history significant of hypertension, hyperlipidemia, diabetes mellitus, COPD, GERD, depression, anxiety, lung cancer (s/p of radiation therapy), dementia, iron deficiency anemia, CKD-3, who presents with shortness of breath. Patient has dementia, and is a poor historian. Patient has been having shortness of breath for several days, which has been progressively worsening.  Patient is not on oxygen at home, but requiring 4 L oxygen with oxygen saturation 100% in ED. Patient has dry cough, but denies chest pain.  No nausea, vomiting, diarrhea, abdominal pain.  Denies symptoms of UTI.  Patient moves all extremities In ED, WBC 7.1, BP 130/100, tachycardia, tachypnea.  Chest x-ray showed cardiomegaly, CT angiogram negative for PE  Assessment & Plan    Principal Problem:  Acute on chronic respiratory failure with hypoxia (HCC) secondary to COPD exacerbation (HCC) -Slight wheezing and rhonchi, 1+ pedal edema, BNP 01/15/2001, may have underlying undiagnosed CHF -Continue bronchodilators, IV Solu-Medrol, Zithromax -Follow 2D echo -Received Lasix 20 mg IV x1, creatinine 1.5 -Change to scheduled Xopenex due to sinus tachycardia  Active Problems: Mildly elevated troponin, tachycardia -Troponin mildly elevated, no chest pain, trending down -Follow 2D echo -EKG showed sinus tachycardia, no A. Fib. -Bronchodilators changed to  Xopenex -Received 1 dose of Cardizem 10 mg IV x1, placed on Cardizem 30 mg every 6 hours, DC amlodipine Addendum: 2D echo reviewed, EF 30 to 35%, global hypokinesis, no prior echo to compare. Cardiology consulted  Obstructive sleep apnea -Continue CPAP, will obtain ABG to assess for any CO2 retention     HTN (hypertension) -Continue Cardizem, metoprolol -Hold lisinopril due to acute kidney injury     HLD (hyperlipidemia) -Continue Lipitor    Dementia (HCC) -Continue Namzaric    Type II diabetes mellitus with renal manifestations (HCC) -Recent A1c 6.6, controlled, hold Metformin -Continue Lantus, sliding scale insulin    Acute renal failure superimposed on stage 3a chronic kidney disease (HCC) -Baseline creatinine 1.1-1.2, presented with creatinine of 1.6 -Creatinine improving to 1.5, received Lasix 20 mg IV x1 -Continue to hold lisinopril, avoid renal toxic medications   OBESITY  Estimated body mass index is 35.33 kg/m as calculated from the following:   Height as of this encounter: 5\' 4"  (1.626 m).   Weight as of this encounter: 93.4 kg.  Code Status: Full DVT Prophylaxis: Lovenox Family Communication: Discussed all imaging results, lab results, explained to the patient's son on the phone Patient is currently a resident of ALF, will await PT evaluation.  May need  skilled nursing facility.     Disposition Plan:     Status is: Inpatient  Remains inpatient appropriate because:Inpatient level of care appropriate due to severity of illness   Dispo: The patient is from: ALF              Anticipated d/c is to: SNF              Anticipated d/c date is: 2 days              Patient currently is not medically stable to d/c.      Time Spent in minutes: 35MINS   Procedures:  None   Consultants:   None  Antimicrobials:   Anti-infectives (From admission, onward)   Start     Dose/Rate Route Frequency Ordered Stop   07/04/19 1000  azithromycin (ZITHROMAX) tablet  250 mg     Discontinue    "Followed by" Linked Group Details   250 mg Oral Daily 07/03/19 1837 07/08/19 0959   07/03/19 1845  azithromycin (ZITHROMAX) tablet 500 mg       "Followed by" Linked Group Details   500 mg Oral Daily 07/03/19 1837 07/03/19 1853          Medications  Scheduled Meds: . ALPRAZolam  0.25 mg Oral BID  . amLODipine  10 mg Oral Daily  . aspirin  325 mg Oral Daily  . atorvastatin  20 mg Oral QHS  . azithromycin  250 mg Oral Daily  . dextromethorphan-guaiFENesin  1 tablet Oral BID  . memantine  28 mg Oral Daily   And  . donepezil  10 mg Oral QHS  . enoxaparin (LOVENOX) injection  40 mg Subcutaneous Q24H  . ferrous sulfate  325 mg Oral BID WC  . fluticasone  2 spray Each Nare Daily  . insulin aspart  0-9 Units Subcutaneous Q4H  . insulin glargine  7 Units Subcutaneous Daily  . ipratropium-albuterol  3 mL Nebulization Q6H  . methylPREDNISolone (SOLU-MEDROL) injection  40 mg Intravenous Q12H  . metoprolol tartrate  25 mg Oral BID  . modafinil  100 mg Oral Daily  . mometasone-formoterol  2 puff Inhalation BID  . pantoprazole  40 mg Oral Daily  . perphenazine  4 mg Oral TID  . QUEtiapine  200 mg Oral QHS   Continuous Infusions: PRN Meds:.acetaminophen, albuterol, alum & mag hydroxide-simeth, hydrALAZINE, magnesium hydroxide, metoprolol tartrate, ondansetron (ZOFRAN) IV      Subjective:   Cheryl [name] was seen and examined today.  Appears to be comfortable, dementia, unable to obtain review of system from the patient. Arousable, does not appear to be in any shortness of breath.    Objective:   Vitals:   07/04/19 0200 07/04/19 0451 07/04/19 0805 07/04/19 0844  BP:  (!) 138/95  134/87  Pulse:  (!) 121  (!) 124  Resp: 19 (!) 21  19  Temp:  97.9 F (36.6 C)    TempSrc:  Axillary    SpO2:  100% 95% 100%  Weight:  93.4 kg    Height:        Intake/Output Summary (Last 24 hours) at 07/04/2019 1028 Last data filed at 07/03/2019 2332 Gross per 24  hour  Intake --  Output 650 ml  Net -650 ml     Wt Readings from Last 3 Encounters:  07/04/19 93.4 kg  05/16/19 92.2 kg  10/19/17 87.9 kg     Exam  General: Alert and awake, dementia  Cardiovascular: S1 S2 auscultated, no  murmurs, RRR  Respiratory: Mild expiratory wheezing  Gastrointestinal: Soft, nontender, nondistended, + bowel sounds  Ext: 1+ pedal edema bilaterally  Neuro: Does not follow commands  Musculoskeletal: No digital cyanosis, clubbing  Skin: No rashes  Psych: Alert and awake, dementia   Data Reviewed:  I have personally reviewed following labs and imaging studies  Micro Results Recent Results (from the past 240 hour(s))  SARS Coronavirus 2 by RT PCR (hospital order, performed in [place] hospital lab) Nasopharyngeal Nasopharyngeal Swab     Status: None   Collection Time: 07/03/19  2:19 PM   Specimen: Nasopharyngeal Swab  Result Value Ref Range Status   SARS Coronavirus 2 NEGATIVE NEGATIVE Final    Comment: (NOTE) SARS-CoV-2 target nucleic acids are NOT DETECTED.  The SARS-CoV-2 RNA is generally detectable in upper and lower respiratory specimens during the acute phase of infection. The lowest concentration of SARS-CoV-2 viral copies this assay can detect is 250 copies / mL. A negative result does not preclude SARS-CoV-2 infection and should not be used as the sole basis for treatment or other patient management decisions.  A negative result may occur with improper specimen collection / handling, submission of specimen other than nasopharyngeal swab, presence of viral mutation(s) within the areas targeted by this assay, and inadequate number of viral copies (<250 copies / mL). A negative result must be combined with clinical observations, patient history, and epidemiological information.  Fact Sheet for Patients:   StrictlyIdeas.no  Fact Sheet for Healthcare  Providers: BankingDealers.co.za  This test is not yet approved or  cleared by the [place] FDA and has been authorized for detection and/or diagnosis of SARS-CoV-2 by FDA under an Emergency Use Authorization (EUA).  This EUA will remain in effect (meaning this test can be used) for the duration of the COVID-19 declaration under Section 564(b)(1) of the Act, 21 U.S.C. section 360bbb-3(b)(1), unless the authorization is terminated or revoked sooner.  Performed at [facility], [address], [place], [place] [address]     Radiology Reports DG Chest 2 View  Result Date: 07/03/2019 CLINICAL DATA:  Shortness of breath and hypoxia. History of lung cancer post radiation. EXAM: CHEST - 2 VIEW COMPARISON:  05/09/2019 and CT 05/09/2019 FINDINGS: Patient slightly rotated to the left. Lungs are adequately inflated without focal airspace consolidation, effusion or pneumothorax. Known left perihilar scarring in this patient with history of lung cancer post radiation. Mild stable cardiomegaly. Remainder of the exam is unchanged. Minimal linear scarring over the left upper lung. IMPRESSION: 1.  No acute findings. 2. Mild stable cardiomegaly. Stable scarring over the left hilar region in this patient with history of lung cancer post radiation. Electronically Signed   By: Marin Olp M.D.   On: 07/03/2019 11:01   CT Angio Chest PE W and/or Wo Contrast  Result Date: 07/03/2019 CLINICAL DATA:  Shortness of breath.  Wheezing.  Tachycardia. EXAM: CT ANGIOGRAPHY CHEST WITH CONTRAST TECHNIQUE: Multidetector CT imaging of the chest was performed using the standard protocol during bolus administration of intravenous contrast. Multiplanar CT image reconstructions and MIPs were obtained to evaluate the vascular anatomy. CONTRAST:  79mL OMNIPAQUE IOHEXOL 350 MG/ML SOLN COMPARISON:  CT scan dated 05/09/2019 FINDINGS: Cardiovascular: Satisfactory opacification of the pulmonary arteries  to the segmental level. No evidence of pulmonary embolism. Borderline cardiomegaly. Trace pericardial effusion. Aortic atherosclerosis. Scattered coronary artery calcifications. Mediastinum/Nodes: There is bronchomalacia of the mainstem bronchi bilaterally. Trachea appears normal. Esophagus is normal. Large chronic hiatal hernia. Lungs/Pleura: Stable parenchymal scarring  anteromedially in the right hemithorax and in the left perihilar region and at the left lung base, unchanged. Tiny new right effusion. Upper Abdomen: No acute abnormality. Stable 17 mm low-density lesion in the right lobe of the liver consistent with a cyst. Musculoskeletal: No chest wall abnormality. No acute or significant osseous findings. Review of the MIP images confirms the above findings. IMPRESSION: 1. No pulmonary emboli. 2. Bronchomalacia of mainstem bronchi bilaterally. Both mainstem bronchi are markedly narrowed. 3. Tiny new right effusion. 4. Stable bilateral parenchymal scarring. 5. Large chronic hiatal hernia. 6.  Aortic Atherosclerosis (ICD10-I70.0). Aortic Atherosclerosis (ICD10-I70.0). Electronically Signed   By: Lorriane Shire M.D.   On: 07/03/2019 13:05    Lab Data:  CBC: Recent Labs  Lab 07/03/19 0947 07/04/19 0659  WBC 7.1 6.5  HGB 10.5* 9.9*  HCT 35.6* 32.7*  MCV 91.5 88.6  PLT 357 323   Basic Metabolic Panel: Recent Labs  Lab 07/03/19 0947 07/04/19 0659  NA 145 142  K 3.7 4.1  CL 111 108  CO2 24 27  GLUCOSE 229* 172*  BUN 33* 38*  CREATININE 1.61* 1.50*  CALCIUM 8.8* 8.7*   GFR: Estimated Creatinine Clearance: 34.8 mL/min (A) (by C-G formula based on SCr of 1.5 mg/dL (H)). Liver Function Tests: No results for input(s): AST, ALT, ALKPHOS, BILITOT, PROT, ALBUMIN in the last 168 hours. No results for input(s): LIPASE, AMYLASE in the last 168 hours. No results for input(s): AMMONIA in the last 168 hours. Coagulation Profile: No results for input(s): INR, PROTIME in the last 168 hours. Cardiac  Enzymes: No results for input(s): CKTOTAL, CKMB, CKMBINDEX, TROPONINI in the last 168 hours. BNP (last 3 results) No results for input(s): PROBNP in the last 8760 hours. HbA1C: No results for input(s): HGBA1C in the last 72 hours. CBG: Recent Labs  Lab 07/03/19 1704 07/03/19 2008 07/04/19 0023 07/04/19 0447 07/04/19 0847  GLUCAP 141* 244* 247* 154* 158*   Lipid Profile: Recent Labs    07/04/19 0659  CHOL 157  HDL 66  LDLCALC 80  TRIG 55  CHOLHDL 2.4   Thyroid Function Tests: No results for input(s): TSH, T4TOTAL, FREET4, T3FREE, THYROIDAB in the last 72 hours. Anemia Panel: No results for input(s): VITAMINB12, FOLATE, FERRITIN, TIBC, IRON, RETICCTPCT in the last 72 hours. Urine analysis:    Component Value Date/Time   COLORURINE YELLOW (A) 05/09/2019 2335   APPEARANCEUR CLEAR (A) 05/09/2019 2335   APPEARANCEUR Hazy 07/26/2013 1013   LABSPEC 1.038 (H) 05/09/2019 2335   LABSPEC 1.029 07/26/2013 1013   PHURINE 5.0 05/09/2019 2335   GLUCOSEU 50 (A) 05/09/2019 2335   GLUCOSEU Negative 07/26/2013 1013   HGBUR NEGATIVE 05/09/2019 2335   BILIRUBINUR NEGATIVE 05/09/2019 2335   BILIRUBINUR Negative 07/26/2013 1013   KETONESUR 5 (A) 05/09/2019 2335   PROTEINUR 30 (A) 05/09/2019 2335   NITRITE NEGATIVE 05/09/2019 2335   LEUKOCYTESUR NEGATIVE 05/09/2019 2335   LEUKOCYTESUR Negative 07/26/2013 1013     Katai Marsico M.D. [facility] 07/04/2019, 10:28 AM   Call night coverage person covering after 7pm

## 2019-07-04 NOTE — Progress Notes (Addendum)
Amiodarone bolus interrupted with infiltrating IV site. IV team consult put in for Amiodarone gtt and Heparin gtt infusion. IV team put in 2 peripheral IV's. Heparin gtt started and Amiodaone bolus restarted. Patient switched over to MX450 monitor. CCMD made aware and monitor confirmed. Q4hr cardiac strips requested. Patient tolerating well. Q15 min VS restarted for the duration of the bolus as it was also interrupted.   Update 1845: Amiodarone gtt started at 33.3 ml/hr at 1844 so dose of 16.67 ml/hr rescheduled to 0044.

## 2019-07-05 DIAGNOSIS — I5020 Unspecified systolic (congestive) heart failure: Secondary | ICD-10-CM

## 2019-07-05 DIAGNOSIS — F0391 Unspecified dementia with behavioral disturbance: Secondary | ICD-10-CM

## 2019-07-05 DIAGNOSIS — N170 Acute kidney failure with tubular necrosis: Secondary | ICD-10-CM

## 2019-07-05 DIAGNOSIS — I4892 Unspecified atrial flutter: Secondary | ICD-10-CM

## 2019-07-05 LAB — GLUCOSE, CAPILLARY
Glucose-Capillary: 230 mg/dL — ABNORMAL HIGH (ref 70–99)
Glucose-Capillary: 189 mg/dL — ABNORMAL HIGH (ref 70–99)
Glucose-Capillary: 190 mg/dL — ABNORMAL HIGH (ref 70–99)
Glucose-Capillary: 209 mg/dL — ABNORMAL HIGH (ref 70–99)
Glucose-Capillary: 162 mg/dL — ABNORMAL HIGH (ref 70–99)
Glucose-Capillary: 245 mg/dL — ABNORMAL HIGH (ref 70–99)

## 2019-07-05 LAB — CBC
HCT: 32.3 % — ABNORMAL LOW (ref 36.0–46.0)
Hemoglobin: 9.7 g/dL — ABNORMAL LOW (ref 12.0–15.0)
MCH: 26.6 pg (ref 26.0–34.0)
MCHC: 30 g/dL (ref 30.0–36.0)
MCV: 88.5 fL (ref 80.0–100.0)
Platelets: 351 10*3/uL (ref 150–400)
RBC: 3.65 MIL/uL — ABNORMAL LOW (ref 3.87–5.11)
RDW: 16.7 % — ABNORMAL HIGH (ref 11.5–15.5)
WBC: 8 10*3/uL (ref 4.0–10.5)
nRBC: 0.7 % — ABNORMAL HIGH (ref 0.0–0.2)

## 2019-07-05 LAB — BASIC METABOLIC PANEL
Anion gap: 10 (ref 5–15)
BUN: 52 mg/dL — ABNORMAL HIGH (ref 8–23)
CO2: 26 mmol/L (ref 22–32)
Calcium: 8.6 mg/dL — ABNORMAL LOW (ref 8.9–10.3)
Chloride: 107 mmol/L (ref 98–111)
Creatinine, Ser: 1.93 mg/dL — ABNORMAL HIGH (ref 0.44–1.00)
GFR calc Af Amer: 28 mL/min — ABNORMAL LOW (ref >60)
GFR calc non Af Amer: 25 mL/min — ABNORMAL LOW (ref >60)
Glucose, Bld: 265 mg/dL — ABNORMAL HIGH (ref 70–99)
Potassium: 4.1 mmol/L (ref 3.5–5.1)
Sodium: 143 mmol/L (ref 135–145)

## 2019-07-05 LAB — HEPARIN LEVEL (UNFRACTIONATED)
Heparin Unfractionated: 1.16 IU/mL — ABNORMAL HIGH (ref 0.30–0.70)
Heparin Unfractionated: 1.08 IU/mL — ABNORMAL HIGH (ref 0.30–0.70)
Heparin Unfractionated: 1.07 IU/mL — ABNORMAL HIGH (ref 0.30–0.70)

## 2019-07-05 LAB — TSH: TSH: 0.307 u[IU]/mL — ABNORMAL LOW (ref 0.350–4.500)

## 2019-07-05 MED ORDER — LEVALBUTEROL HCL 0.63 MG/3ML IN NEBU
0.6300 mg | INHALATION_SOLUTION | Freq: Three times a day (TID) | RESPIRATORY_TRACT | Status: DC
  Administered 2019-07-06 – 2019-07-14 (×25): 0.63 mg via RESPIRATORY_TRACT
  Filled 2019-07-05 (×25): qty 3

## 2019-07-05 MED ORDER — METOPROLOL TARTRATE 50 MG PO TABS: 75.0000 mg | ORAL_TABLET | Freq: Two times a day (BID) | ORAL | Status: DC

## 2019-07-05 MED ORDER — METOPROLOL TARTRATE 25 MG PO TABS
37.5000 mg | ORAL_TABLET | Freq: Four times a day (QID) | ORAL | Status: AC
  Administered 2019-07-05 – 2019-07-06 (×4): 37.5 mg via ORAL
  Filled 2019-07-05 (×4): qty 2

## 2019-07-05 MED ORDER — HEPARIN (PORCINE) 25000 UT/250ML-% IV SOLN
700.0000 [IU]/h | INTRAVENOUS | Status: DC
  Administered 2019-07-05: 700 [IU]/h via INTRAVENOUS
  Filled 2019-07-05: qty 250

## 2019-07-05 MED ORDER — LEVALBUTEROL HCL 0.63 MG/3ML IN NEBU
0.6300 mg | INHALATION_SOLUTION | Freq: Three times a day (TID) | RESPIRATORY_TRACT | Status: DC
  Administered 2019-07-05 (×2): 0.63 mg via RESPIRATORY_TRACT
  Filled 2019-07-05 (×2): qty 3

## 2019-07-05 MED ORDER — METOPROLOL TARTRATE 25 MG PO TABS
25.0000 mg | ORAL_TABLET | Freq: Once | ORAL | Status: AC
  Administered 2019-07-05: 25 mg via ORAL
  Filled 2019-07-05: qty 1

## 2019-07-05 MED ORDER — HEPARIN (PORCINE) 25000 UT/250ML-% IV SOLN: 900.0000 [IU]/h | INTRAVENOUS | Status: DC

## 2019-07-05 NOTE — Progress Notes (Signed)
Inpatient Diabetes Program Recommendations  AACE/ADA: New Consensus Statement on Inpatient Glycemic Control (2015)  Target Ranges:  Prepandial:   less than 140 mg/dL      Peak postprandial:   less than 180 mg/dL (1-2 hours)      Critically ill patients:  140 - 180 mg/dL   Lab Results  Component Value Date   GLUCAP 190 (H) 07/05/2019   HGBA1C 6.7 (H) 07/04/2019    Review of Glycemic Control Results for [name], [name] (MRN [id number]) as of 07/05/2019 11:21  Ref. Range 07/04/2019 16:54 07/04/2019 20:01 07/05/2019 01:58 07/05/2019 05:11 07/05/2019 07:18  Glucose-Capillary Latest Ref Range: 70 - 99 mg/dL 137 (H) 220 (H) 230 (H) 189 (H) 190 (H)   Diabetes history: DM 2 Outpatient Diabetes medications:  Lantus 10 units daily, Metformin 500 mg bid Current orders for Inpatient glycemic control:  Lantus 7 units daily Novolog sensitive q 4 hours Solumedrol 40 mg IV q 12 hours Inpatient Diabetes Program Recommendations:    May consider increasing Lantus to 10 units daily.    Thanks,  Cheryl Perl, RN, BC-ADM Inpatient Diabetes Coordinator Pager [phone] (8a-5p)

## 2019-07-05 NOTE — Progress Notes (Signed)
ANTICOAGULATION CONSULT NOTE - Follow up [place] for Heparin Drip Indication: atrial fibrillation  No Known Allergies  Patient Measurements: Height: 5\' 4"  (162.6 cm) Weight: 93.9 kg (207 lb 1.6 oz) IBW/kg (Calculated) : 54.7 Heparin Dosing Weight: 75.8 kg  Vital Signs: Temp: 97.6 F (36.4 C) (06/22 1212) Temp Source: Oral (06/22 1212) BP: 97/74 (06/22 1215) Pulse Rate: 106 (06/22 1215)  Labs: Recent Labs    07/03/19 0947 07/03/19 0947 07/03/19 1147 07/03/19 1526 07/03/19 1918 07/04/19 0659 07/05/19 0207 07/05/19 1252  HGB 10.5*   < >  --   --   --  9.9* 9.7*  --   HCT 35.6*  --   --   --   --  32.7* 32.3*  --   PLT 357  --   --   --   --  378 351  --   HEPARINUNFRC  --   --   --   --   --   --  1.16* 1.08*  CREATININE 1.61*  --   --   --   --  1.50* 1.93*  --   TROPONINIHS 50*   < > 53* 42* 43*  --   --   --    < > = values in this interval not displayed.    Estimated Creatinine Clearance: 27.1 mL/min (A) (by C-G formula based on SCr of 1.93 mg/dL (H)).  Assessment: Patient is a 79 yo female, pharmacy has been consulted to manage Heparin drip for afib. Patient was previously on Enoxaparin 40mg  SQ q24h with last dose received on 6/20 ~22:00.  Heparin course: Heparin 4500 units IV x1 then 1150 units/hr 6/22: HL @ 0207 = 1.16 - Will hold heparin drip for 1 hr and restart @ 900 units/hr.   Goal of Therapy:  Heparin level 0.3-0.7 units/ml Monitor platelets by anticoagulation protocol: Yes   Plan:  6/22 @ 1252 HL = 1.08 - not much decrease despite holding drip and significant rate decrease  Heparin level drawn right above IV site on left arm, per RN drip likely not paused for lab draw.  Will order stat repeat heparin level -  Called lab and asked them to draw from opposite arm from IV site if possible.  No s/sx of bleeding per RN  Pharmacy will continue to follow  Rocky Morel 07/05/2019 2:23 PM

## 2019-07-05 NOTE — Progress Notes (Signed)
ANTICOAGULATION CONSULT NOTE - Follow up [place] for Heparin Drip Indication: atrial fibrillation  No Known Allergies  Patient Measurements: Height: 5\' 4"  (162.6 cm) Weight: 93.9 kg (207 lb 1.6 oz) IBW/kg (Calculated) : 54.7 Heparin Dosing Weight: 75.8 kg  Vital Signs: Temp: 97.6 F (36.4 C) (06/22 1212) Temp Source: Oral (06/22 1212) BP: 140/95 (06/22 1445) Pulse Rate: 109 (06/22 1445)  Labs: Recent Labs    07/03/19 0947 07/03/19 0947 07/03/19 1147 07/03/19 1526 07/03/19 1918 07/04/19 0659 07/05/19 0207 07/05/19 1252 07/05/19 1431  HGB 10.5*   < >  --   --   --  9.9* 9.7*  --   --   HCT 35.6*  --   --   --   --  32.7* 32.3*  --   --   PLT 357  --   --   --   --  378 351  --   --   HEPARINUNFRC  --   --   --   --   --   --  1.16* 1.08* 1.07*  CREATININE 1.61*  --   --   --   --  1.50* 1.93*  --   --   TROPONINIHS 50*   < > 53* 42* 43*  --   --   --   --    < > = values in this interval not displayed.    Estimated Creatinine Clearance: 27.1 mL/min (A) (by C-G formula based on SCr of 1.93 mg/dL (H)).  Assessment: Patient is a 79 yo female, pharmacy has been consulted to manage Heparin drip for afib. Patient was previously on Enoxaparin 40mg  SQ q24h with last dose received on 6/20 ~22:00.  Heparin course: Heparin 4500 units IV x1 then 1150 units/hr 6/22: HL @ 0207 = 1.16 - Will hold heparin drip for 1 hr and restart @ 900 units/hr.   Goal of Therapy:  Heparin level 0.3-0.7 units/ml Monitor platelets by anticoagulation protocol: Yes   Plan:  6/22 @ 1252 HL = 1.08 - not much decrease despite holding drip and significant rate decrease, looks like lab may have been drawn from same arm as IV 6/22 @ 1431 HL = 1.07 on repeat - per RN, drawn from opposite arm from IV site Heparin level >1, will hold for one hour, then restart heparin at 700 units/hr Recheck 8h after restart, CBC in AM No s/sx of bleeding per RN  Pharmacy will continue to  follow  Rocky Morel 07/05/2019 3:22 PM

## 2019-07-05 NOTE — Progress Notes (Signed)
ANTICOAGULATION CONSULT NOTE - Initial Consult  Pharmacy Consult for Heparin Drip Indication: atrial fibrillation  No Known Allergies  Patient Measurements: Height: 5\' 4"  (162.6 cm) Weight: 93.4 kg (205 lb 12.8 oz) IBW/kg (Calculated) : 54.7 Heparin Dosing Weight: 75.8 kg  Vital Signs: Temp: 98.3 F (36.8 C) (06/21 2000) Temp Source: Oral (06/21 2000) BP: 119/81 (06/22 0000) Pulse Rate: 103 (06/22 0000)  Labs: Recent Labs    07/03/19 0947 07/03/19 0947 07/03/19 1147 07/03/19 1526 07/03/19 1918 07/04/19 0659 07/05/19 0207  HGB 10.5*   < >  --   --   --  9.9* 9.7*  HCT 35.6*  --   --   --   --  32.7* 32.3*  PLT 357  --   --   --   --  378 351  HEPARINUNFRC  --   --   --   --   --   --  1.16*  CREATININE 1.61*  --   --   --   --  1.50* 1.93*  TROPONINIHS 50*   < > 53* 42* 43*  --   --    < > = values in this interval not displayed.    Estimated Creatinine Clearance: 27.1 mL/min (A) (by C-G formula based on SCr of 1.93 mg/dL (H)).   Medical History: Past Medical History:  Diagnosis Date  . COPD (chronic obstructive pulmonary disease) ([place])   . Dementia ([place])   . Diabetes mellitus without complication ([place])   . GERD (gastroesophageal reflux disease)   . Hyperlipidemia   . Hypertension   . Lung cancer ([place])   . Mental retardation   . Schizophrenia [facility])     Assessment: Patient is a 79 yo female, pharmacy has been consulted to manage Heparin drip for afib. Patient was previously on Enoxaparin 40mg  SQ q24h with last dose received on 6/20 ~22:00.  Goal of Therapy:  Heparin level 0.3-0.7 units/ml Monitor platelets by anticoagulation protocol: Yes   Plan:  Give 4500 units bolus x 1 Start heparin infusion at 1150 units/hr Check anti-Xa level in 8 hours and daily while on heparin Continue to monitor H&H and platelets   6/22: HL @ 0207 = 1.16 Will hold heparin drip for 1 hr and restart @ 900 units/hr. Will recheck HL 8 hrs after restart on 6/22 @ 1230.    Avin Upperman D 07/05/2019 3:18 AM

## 2019-07-05 NOTE — Progress Notes (Signed)
Progress Note  Patient Name: Cheryl [name] Date of Encounter: 07/05/2019  Primary Cardiologist: New to [facility] - consult by Fletcher Anon  Subjective   Confused this morning.  Further history is unable to be obtained.  She remains in atrial flutter with RVR and variable AV block with ventricular rates ranging from the 80s to 1-teens bpm.  Renal function slightly worse today with a BUN/SCr trending from 38/1.5 to 52/1.93.  Hemoglobin low though stable.  Inpatient Medications    Scheduled Meds: . ALPRAZolam  0.25 mg Oral BID  . atorvastatin  20 mg Oral QHS  . azithromycin  250 mg Oral Daily  . dextromethorphan-guaiFENesin  1 tablet Oral BID  . memantine  28 mg Oral Daily   And  . donepezil  10 mg Oral QHS  . ferrous sulfate  325 mg Oral BID WC  . fluticasone  2 spray Each Nare Daily  . insulin aspart  0-9 Units Subcutaneous Q4H  . insulin glargine  7 Units Subcutaneous Daily  . levalbuterol  0.63 mg Nebulization Q8H  . methylPREDNISolone (SOLU-MEDROL) injection  40 mg Intravenous Q12H  . metoprolol tartrate  75 mg Oral BID  . modafinil  100 mg Oral Daily  . mometasone-formoterol  2 puff Inhalation BID  . pantoprazole  40 mg Oral Daily  . perphenazine  4 mg Oral TID  . QUEtiapine  200 mg Oral QHS   Continuous Infusions: . amiodarone 30 mg/hr (07/05/19 0044)  . heparin 900 Units/hr (07/05/19 0421)   PRN Meds: acetaminophen, albuterol, alum & mag hydroxide-simeth, hydrALAZINE, magnesium hydroxide, metoprolol tartrate, ondansetron (ZOFRAN) IV   Vital Signs    Vitals:   07/05/19 0720 07/05/19 0722 07/05/19 0900 07/05/19 1000  BP:  (!) 140/110    Pulse: (!) 108 (!) 111 (!) 109 90  Resp:  20 (!) 26 (!) 21  Temp: 98.5 F (36.9 C)     TempSrc: Axillary     SpO2:  97% 98% 96%  Weight:      Height:        Intake/Output Summary (Last 24 hours) at 07/05/2019 1128 Last data filed at 07/05/2019 0950 Gross per 24 hour  Intake 740.92 ml  Output 700 ml  Net 40.92 ml   Filed  Weights   07/03/19 1830 07/04/19 0451 07/05/19 0500  Weight: 93.2 kg 93.4 kg 93.9 kg    Telemetry    Atrial flutter with RVR with variable AV block with ventricular rates ranging from the 80s to 1 teens bpm and rare PVC - Personally Reviewed  ECG    No new tracings- Personally Reviewed  Physical Exam   GEN: No acute distress.   Neck: No JVD. Cardiac:  Tachycardic and regular, no murmurs, rubs, or gallops.  Respiratory:  Diminished breath sounds bilaterally.  GI: Soft, nontender, non-distended.   MS: No edema; No deformity. Neuro:   Confused; Nonfocal.  Psych: Confused.  Labs    Chemistry Recent Labs  Lab 07/03/19 0947 07/04/19 0659 07/05/19 0207  NA 145 142 143  K 3.7 4.1 4.1  CL 111 108 107  CO2 24 27 26   GLUCOSE 229* 172* 265*  BUN 33* 38* 52*  CREATININE 1.61* 1.50* 1.93*  CALCIUM 8.8* 8.7* 8.6*  GFRNONAA 31* 33* 25*  GFRAA 35* 39* 28*  ANIONGAP 10 7 10      Hematology Recent Labs  Lab 07/03/19 0947 07/04/19 0659 07/05/19 0207  WBC 7.1 6.5 8.0  RBC 3.89 3.69* 3.65*  HGB 10.5* 9.9* 9.7*  HCT 35.6* 32.7* 32.3*  MCV 91.5 88.6 88.5  MCH 27.0 26.8 26.6  MCHC 29.5* 30.3 30.0  RDW 16.8* 16.4* 16.7*  PLT 357 378 351    Cardiac EnzymesNo results for input(s): TROPONINI in the last 168 hours. No results for input(s): TROPIPOC in the last 168 hours.   BNP Recent Labs  Lab 07/03/19 0947  BNP 1,303.1*     DDimer No results for input(s): DDIMER in the last 168 hours.   Radiology    CT Angio Chest PE W and/or Wo Contrast  Result Date: 07/03/2019 IMPRESSION: 1. No pulmonary emboli. 2. Bronchomalacia of mainstem bronchi bilaterally. Both mainstem bronchi are markedly narrowed. 3. Tiny new right effusion. 4. Stable bilateral parenchymal scarring. 5. Large chronic hiatal hernia. 6.  Aortic Atherosclerosis (ICD10-I70.0). Aortic Atherosclerosis (ICD10-I70.0). Electronically Signed   By: Lorriane Shire M.D.   On: 07/03/2019 13:05    Cardiac Studies   2D  echo 07/04/2019: 1. Left ventricular ejection fraction, by estimation, is 30 to 35%. The  left ventricle has moderately decreased function. The left ventricle  demonstrates global hypokinesis. There is mild left ventricular  hypertrophy. Left ventricular diastolic  parameters are indeterminate.  2. Right ventricular systolic function is mildly reduced. The right  ventricular size is mildly enlarged. There is mildly elevated pulmonary  artery systolic pressure.  3. Left atrial size was mildly dilated.  4. Right atrial size was moderately dilated.  5. The mitral valve is normal in structure. Trivial mitral valve  regurgitation. No evidence of mitral stenosis.  6. Tricuspid valve regurgitation is moderate.  7. The aortic valve is normal in structure. Aortic valve regurgitation is  trivial. Mild aortic valve sclerosis is present, with no evidence of  aortic valve stenosis.  8. Moderately dilated pulmonary artery.  Patient Profile     79 y.o. female with history of DM2, HTN, HLD, cognitive impairment/dementia, COPD, CKD stage III, lung cancer s/p radiation therapy, iron deficiency anemia, depression, anxiety, and GERD who is being seen today for the evaluation of acute systolic CHF  and atrial flutter with RVR at the request of Dr. Tana Coast.  Assessment & Plan    1. Acute HFrEF/pulmonary hypertension: -Possibly tachy-mediated in the setting of atrial flutter with RVR -Echo this admission with reduced LVSF with an EF of 30-35% and a PASP of 44.8 mmHg -IV diuresis held in the setting of worsening renal function  -Rate control atrial flutter as below -It is unclear how/if she is compliant with CPAP -With her underlying dementia she has been not been felt to be a good candidate for ischemic cardiac evaluation with medical management recommended -Escalate Lopressor as outlined below which should be consolidated to Toprol-XL prior to discharge given her cardiomyopathy -Not on ACE  inhibitor/ARB/Entresto/MRA secondary to acute on CKD  2. New onset atrial flutter with RVR: -Asymptomatic  -Uncertain duration as she presented in this rhythm  -She remains in atrial flutter with RVR ventricular rates in the 80s to 1 teens bpm despite recently titrated Lopressor to 50 mg bid and amiodarone drip for rate control -Overall, this is somewhat of a difficult situation given her underlying dementia/cognitive impairment  -Uncertain if she would tolerate a TEE-guided DCCV, that said if her tachycardic rates persist this will need to be considered -Titrate Lopressor to 75 mg twice daily -Continue amiodarone drip for rate control in the hope of chemically converting her, however this does carry a stroke risk but there are not good alternatives -CHADS2VASc at least 7 (CHF,  HTN, age x 2, DM2, vascular disease, gender) -Continue heparin gtt -Her son, Allyna Pittsley, is her legal guardian [phone], and we will plan to reach out to him today   3. Acute hypoxic respiratory distress: -Stable -Multifactorial including AECOPD, acute HFrEF, and atrial flutter with RVR -Currently on room air  4. AECOPD: -Nebs, steroids, antibiotics per IM -Albuterol has been changed to Xopenex given atrial flutter   5. Elevated HS-Tn: -Minimally elevated and not consistent with ACS -Supply demand ischemia in the setting of the above -With her underlying dementia, she has been felt to not be a good ischemic evaluation candidate   6. Anemia: -Low though stable -Monitor, down ~ 2 grams from 05/2019 admission  7. Acute on CKD stage III: -Slight bump in renal function this morning -Diuresis on hold -Monitor   8. Cognitive impairment/dementia: -Stable  9. OSA: -CPAP  For questions or updates, please contact [facility] Please consult [email or web address] for contact info under Cardiology/STEMI.    Signed, Christell Faith, PA-C [place] Pager: [phone] 07/05/2019, 11:28 AM

## 2019-07-05 NOTE — Progress Notes (Signed)
[facility]                                                                              Patient Demographics  Cheryl [name], is a 79 y.o. female, DOB - 11-18-1940, MVE:[id number]  Admit date - 07/03/2019   Admitting Physician Ivor Costa, MD  Outpatient Primary MD for the patient is Letta Median, MD  Outpatient specialists:   LOS - 2  days   Medical records reviewed and are as summarized below:    Chief Complaint  Patient presents with  . Shortness of Breath       Brief summary   Cheryl [name] is a 79 y.o. female with medical history significant of hypertension, hyperlipidemia, diabetes mellitus, COPD, GERD, depression, anxiety, lung cancer (s/p of radiation therapy), dementia, iron deficiency anemia, CKD-3, who presents with shortness of breath. Patient has dementia, and is a poor historian. Patient has been having shortness of breath for several days, which has been progressively worsening.  Patient is not on oxygen at home, but requiring 4 L oxygen with oxygen saturation 100% in ED. Patient has dry cough, but denies chest pain.  No nausea, vomiting, diarrhea, abdominal pain.  Denies symptoms of UTI.  Patient moves all extremities In ED, WBC 7.1, BP 130/100, tachycardia, tachypnea.  Chest x-ray showed cardiomegaly, CT angiogram negative for PE  Assessment & Plan    Principal Problem:  Acute on chronic respiratory failure with hypoxia (HCC) secondary to COPD exacerbation (HCC) -Slight wheezing and rhonchi, on admission, 1+ pedal edema, BNP 01/15/2001, may have underlying undiagnosed CHF -Continue bronchodilators, IV Solu-Medrol, taper to 40 mg every 12 hours, if improving, transition to oral prednisone in a.m., continue Zithromax  -Continue scheduled Xopenex due to A. fib with RVR  Active Problems: Mildly elevated troponin, atrial flutter with RVR - 2D echo reviewed, EF 30 to 35%, global hypokinesis, no prior echo to compare. 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   Weight as of this encounter: 93.9 kg.  Code Status: Full DVT Prophylaxis: Lovenox Family Communication: Discussed all imaging results, lab results, explained to the patient's son on the phone Patient is currently a resident of ALF, will await PT evaluation.  May need skilled nursing facility.     Disposition Plan:     Status  is: Inpatient  Remains inpatient appropriate because:Inpatient level of care appropriate due to severity of illness   Dispo: The patient is from: ALF              Anticipated d/c is to: SNF              Anticipated d/c date is: 2 days              Patient currently is not medically stable to d/c.      Time Spent in minutes: 35MINS   Procedures:  None   Consultants:   None  Antimicrobials:   Anti-infectives (From admission, onward)   Start     Dose/Rate Route Frequency Ordered Stop   07/04/19 1000  azithromycin (ZITHROMAX) tablet 250 mg     Discontinue    "Followed by" Linked Group Details   250  mg Oral Daily 07/03/19 1837 07/08/19 0959   07/03/19 1845  azithromycin (ZITHROMAX) tablet 500 mg       "Followed by" Linked Group Details   500 mg Oral Daily 07/03/19 1837 07/03/19 1853         Medications  Scheduled Meds: . ALPRAZolam  0.25 mg Oral BID  . atorvastatin  20 mg Oral QHS  . azithromycin  250 mg Oral Daily  . dextromethorphan-guaiFENesin  1 tablet Oral BID  . memantine  28 mg Oral Daily   And  . donepezil  10 mg Oral QHS  . ferrous sulfate  325 mg Oral BID WC  . fluticasone  2 spray Each Nare Daily  . insulin aspart  0-9 Units Subcutaneous Q4H  . insulin glargine  7 Units Subcutaneous Daily  . levalbuterol  0.63 mg Nebulization Q8H  . methylPREDNISolone (SOLU-MEDROL) injection  40 mg Intravenous Q12H  . metoprolol tartrate  37.5 mg Oral Q6H  . modafinil  100 mg Oral Daily  . mometasone-formoterol  2 puff Inhalation BID  . pantoprazole  40 mg Oral Daily  . perphenazine  4 mg Oral TID  . QUEtiapine  200 mg Oral QHS   Continuous Infusions: . amiodarone 30 mg/hr (07/05/19 1211)  . heparin 900 Units/hr (07/05/19 0421)   PRN Meds:.acetaminophen, albuterol, alum & mag hydroxide-simeth, hydrALAZINE, magnesium hydroxide, metoprolol tartrate, ondansetron (ZOFRAN) IV      Subjective:   Cheryl [name] was seen and examined today.  Rate continues to be in the low 100s however asymptomatic.  Denies any chest pain, shortness of breath or dizziness.  Much more alert and oriented today, overnight used CPAP.  Denies any nausea vomiting abdominal pain, any new weakness  Objective:   Vitals:   07/05/19 1000 07/05/19 1200 07/05/19 1212 07/05/19 1215  BP:    97/74  Pulse: 90 97  (!) 106  Resp: (!) 21 (!) 22  17  Temp:   97.6 F (36.4 C)   TempSrc:   Oral   SpO2: 96% 97%  93%  Weight:      Height:        Intake/Output Summary (Last 24 hours) at 07/05/2019 1329 Last data filed at 07/05/2019 0950 Gross per 24 hour  Intake 740.92 ml  Output 700 ml  Net 40.92 ml      Wt Readings from Last 3 Encounters:  07/05/19 93.9 kg  05/16/19 92.2 kg  10/19/17 87.9 kg    Physical Exam  General: Much more alert and oriented today, has underlying dementia   cardiovascular: S1 S2 clear, ireg ireg, 1+ pedal edema b/l  Respiratory: Mild  expiratory wheezing bilaterally  Gastrointestinal: Soft, nontender, nondistended, NBS  Ext: 1+ pedal edema bilaterally  Neuro: no new deficits  Musculoskeletal: No cyanosis, clubbing  Skin: No rashes  Psych: dementia, alert     Data Reviewed:  I have personally reviewed following labs and imaging studies  Micro Results Recent Results (from the past 240 hour(s))  SARS Coronavirus 2 by RT PCR (hospital order, performed in [place] hospital lab) Nasopharyngeal Nasopharyngeal Swab     Status: None   Collection Time: 07/03/19  2:19 PM   Specimen: Nasopharyngeal Swab  Result Value Ref Range Status   SARS Coronavirus 2 NEGATIVE NEGATIVE Final    Comment: (NOTE) SARS-CoV-2 target nucleic acids are NOT DETECTED.  The SARS-CoV-2 RNA is generally detectable in upper and lower respiratory specimens during the acute phase of infection. The lowest concentration of SARS-CoV-2 viral copies this assay can detect is 250 copies / mL. A negative result does not preclude SARS-CoV-2 infection and should not be used as the sole basis for treatment or other patient management decisions.  A negative result may occur with improper specimen collection / handling, submission of specimen other than nasopharyngeal swab, presence of viral mutation(s) within the areas targeted by this assay, and inadequate number of viral copies (<250 copies / mL). A negative result must be combined with clinical observations, patient history, and epidemiological information.  Fact Sheet for Patients:   StrictlyIdeas.no  Fact Sheet for Healthcare Providers: BankingDealers.co.za  This test is not yet  approved or  cleared by the [place] FDA and has been authorized for detection and/or diagnosis of SARS-CoV-2 by FDA under an Emergency Use Authorization (EUA).  This EUA will remain in effect (meaning this test can be used) for the duration of the COVID-19 declaration under Section 564(b)(1) of the Act, 21 U.S.C. section 360bbb-3(b)(1), unless the authorization is terminated or revoked sooner.  Performed at [facility], [address] [place], [place] [address]   MRSA PCR Screening     Status: None   Collection Time: 07/04/19 12:20 PM   Specimen: Nasopharyngeal  Result Value Ref Range Status   MRSA by PCR NEGATIVE NEGATIVE Final    Comment:        The GeneXpert MRSA Assay (FDA approved for NASAL specimens only), is one component of a comprehensive MRSA colonization surveillance program. It is not intended to diagnose MRSA infection nor to guide or monitor treatment for MRSA infections. Performed at [facility], [address] [place], [place] [address]     Radiology Reports DG Chest 2 View  Result Date: 07/03/2019 CLINICAL DATA:  Shortness of breath and hypoxia. History of lung cancer post radiation. EXAM: CHEST - 2 VIEW COMPARISON:  05/09/2019 and CT 05/09/2019 FINDINGS: Patient slightly rotated to the left. Lungs are adequately inflated without focal airspace consolidation, effusion or pneumothorax. Known left perihilar scarring in this patient with history of lung cancer post radiation. Mild stable cardiomegaly. Remainder of the exam is unchanged. Minimal linear scarring over the left upper lung. IMPRESSION: 1.  No acute findings. 2. Mild stable cardiomegaly. Stable scarring over the left hilar region in this patient with history of lung cancer post radiation. Electronically Signed   By: Marin Olp M.D.   On: 07/03/2019 11:01   CT Angio Chest PE W and/or Wo Contrast  Result Date: 07/03/2019 CLINICAL DATA:  Shortness of breath.  Wheezing.   Tachycardia. EXAM: CT ANGIOGRAPHY CHEST WITH CONTRAST TECHNIQUE: Multidetector CT imaging of the chest was performed  using the standard protocol during bolus administration of intravenous contrast. Multiplanar CT image reconstructions and MIPs were obtained to evaluate the vascular anatomy. CONTRAST:  22mL OMNIPAQUE IOHEXOL 350 MG/ML SOLN COMPARISON:  CT scan dated 05/09/2019 FINDINGS: Cardiovascular: Satisfactory opacification of the pulmonary arteries to the segmental level. No evidence of pulmonary embolism. Borderline cardiomegaly. Trace pericardial effusion. Aortic atherosclerosis. Scattered coronary artery calcifications. Mediastinum/Nodes: There is bronchomalacia of the mainstem bronchi bilaterally. Trachea appears normal. Esophagus is normal. Large chronic hiatal hernia. Lungs/Pleura: Stable parenchymal scarring anteromedially in the right hemithorax and in the left perihilar region and at the left lung base, unchanged. Tiny new right effusion. Upper Abdomen: No acute abnormality. Stable 17 mm low-density lesion in the right lobe of the liver consistent with a cyst. Musculoskeletal: No chest wall abnormality. No acute or significant osseous findings. Review of the MIP images confirms the above findings. IMPRESSION: 1. No pulmonary emboli. 2. Bronchomalacia of mainstem bronchi bilaterally. Both mainstem bronchi are markedly narrowed. 3. Tiny new right effusion. 4. Stable bilateral parenchymal scarring. 5. Large chronic hiatal hernia. 6.  Aortic Atherosclerosis (ICD10-I70.0). Aortic Atherosclerosis (ICD10-I70.0). Electronically Signed   By: Lorriane Shire M.D.   On: 07/03/2019 13:05   ECHOCARDIOGRAM COMPLETE  Result Date: 07/04/2019    ECHOCARDIOGRAM REPORT   Patient Name:   Cheryl [name] Date of Exam: 07/04/2019 Medical Rec #:  [id number]      Height:       64.0 in Accession #:    [id number]     Weight:       205.8 lb Date of Birth:  10-26-1940      BSA:          1.980 m Patient Age:    61 years       BP:            134/87 mmHg Patient Gender: F              HR:           124 bpm. Exam Location:  [facility] Procedure: Color Doppler and Cardiac Doppler Indications:     I50.31 CHF-Acute Diastolic  History:         Patient has no prior history of Echocardiogram examinations.                  COPD; Risk Factors:Hypertension, Dyslipidemia and Diabetes.  Sonographer:     Charmayne Sheer RDCS (AE) Referring Phys:  Baker Janus Soledad Gerlach NIU Diagnosing Phys: Kathlyn Sacramento MD  Sonographer Comments: Suboptimal apical window and suboptimal subcostal window. Image acquisition challenging due to COPD. IMPRESSIONS  1. Left ventricular ejection fraction, by estimation, is 30 to 35%. The left ventricle has moderately decreased function. The left ventricle demonstrates global hypokinesis. There is mild left ventricular hypertrophy. Left ventricular diastolic parameters are indeterminate.  2. Right ventricular systolic function is mildly reduced. The right ventricular size is mildly enlarged. There is mildly elevated pulmonary artery systolic pressure.  3. Left atrial size was mildly dilated.  4. Right atrial size was moderately dilated.  5. The mitral valve is normal in structure. Trivial mitral valve regurgitation. No evidence of mitral stenosis.  6. Tricuspid valve regurgitation is moderate.  7. The aortic valve is normal in structure. Aortic valve regurgitation is trivial. Mild aortic valve sclerosis is present, with no evidence of aortic valve stenosis.  8. Moderately dilated pulmonary artery. FINDINGS  Left Ventricle: Left ventricular ejection fraction, by estimation, is 30 to 35%. The left ventricle has moderately decreased function. The  left ventricle demonstrates global hypokinesis. The left ventricular internal cavity size was normal in size. There is mild left ventricular hypertrophy. Left ventricular diastolic parameters are indeterminate. Right Ventricle: The right ventricular size is mildly enlarged. No increase in right ventricular wall  thickness. Right ventricular systolic function is mildly reduced. There is mildly elevated pulmonary artery systolic pressure. The tricuspid regurgitant  velocity is 2.95 m/s, and with an assumed right atrial pressure of 10 mmHg, the estimated right ventricular systolic pressure is 89.3 mmHg. Left Atrium: Left atrial size was mildly dilated. Right Atrium: Right atrial size was moderately dilated. Pericardium: There is no evidence of pericardial effusion. Mitral Valve: The mitral valve is normal in structure. Normal mobility of the mitral valve leaflets. Mild mitral annular calcification. Trivial mitral valve regurgitation. No evidence of mitral valve stenosis. MV peak gradient, 6.0 mmHg. The mean mitral valve gradient is 3.0 mmHg. Tricuspid Valve: The tricuspid valve is normal in structure. Tricuspid valve regurgitation is moderate . No evidence of tricuspid stenosis. Aortic Valve: The aortic valve is normal in structure. Aortic valve regurgitation is trivial. Mild aortic valve sclerosis is present, with no evidence of aortic valve stenosis. Aortic valve mean gradient measures 2.0 mmHg. Aortic valve peak gradient measures 2.9 mmHg. Aortic valve area, by VTI measures 2.98 cm. Pulmonic Valve: The pulmonic valve was normal in structure. Pulmonic valve regurgitation is mild. No evidence of pulmonic stenosis. Aorta: The aortic root is normal in size and structure. Pulmonary Artery: The pulmonary artery is moderately dilated. Venous: The inferior vena cava was not well visualized. IAS/Shunts: No atrial level shunt detected by color flow Doppler.  LEFT VENTRICLE PLAX 2D LVIDd:         4.31 cm  Diastology LVIDs:         3.75 cm  LV e' lateral:   8.38 cm/s LV PW:         1.25 cm  LV E/e' lateral: 14.8 LV IVS:        1.02 cm  LV e' medial:    5.11 cm/s LVOT diam:     2.00 cm  LV E/e' medial:  24.3 LV SV:         35 LV SV Index:   18 LVOT Area:     3.14 cm  RIGHT VENTRICLE RV Basal diam:  4.15 cm LEFT ATRIUM              Index       RIGHT ATRIUM           Index LA diam:        3.60 cm 1.82 cm/m  RA Area:     25.90 cm LA Vol (A2C):   29.1 ml 14.69 ml/m RA Volume:   91.70 ml  46.31 ml/m LA Vol (A4C):   49.7 ml 25.10 ml/m LA Biplane Vol: 40.1 ml 20.25 ml/m  AORTIC VALVE                   PULMONIC VALVE AV Area (Vmax):    2.84 cm    PV Vmax:       0.66 m/s AV Area (Vmean):   2.71 cm    PV Vmean:      50.800 cm/s AV Area (VTI):     2.98 cm    PV VTI:        0.087 m AV Vmax:           84.60 cm/s  PV Peak grad:  1.8 mmHg AV Vmean:  61.400 cm/s PV Mean grad:  1.0 mmHg AV VTI:            0.117 m AV Peak Grad:      2.9 mmHg AV Mean Grad:      2.0 mmHg LVOT Vmax:         76.50 cm/s LVOT Vmean:        53.000 cm/s LVOT VTI:          0.111 m LVOT/AV VTI ratio: 0.95  AORTA Ao Root diam: 3.60 cm MITRAL VALVE                TRICUSPID VALVE MV Area (PHT): 6.88 cm     TR Peak grad:   34.8 mmHg MV Peak grad:  6.0 mmHg     TR Vmax:        295.00 cm/s MV Mean grad:  3.0 mmHg MV Vmax:       1.22 m/s     SHUNTS MV Vmean:      81.6 cm/s    Systemic VTI:  0.11 m MV Decel Time: 110 msec     Systemic Diam: 2.00 cm MV E velocity: 124.00 cm/s Kathlyn Sacramento MD Electronically signed by Kathlyn Sacramento MD Signature Date/Time: 07/04/2019/12:31:41 PM    Final     Lab Data:  CBC: Recent Labs  Lab 07/03/19 0947 07/04/19 0659 07/05/19 0207  WBC 7.1 6.5 8.0  HGB 10.5* 9.9* 9.7*  HCT 35.6* 32.7* 32.3*  MCV 91.5 88.6 88.5  PLT 357 378 562   Basic Metabolic Panel: Recent Labs  Lab 07/03/19 0947 07/04/19 0659 07/05/19 0207  NA 145 142 143  K 3.7 4.1 4.1  CL 111 108 107  CO2 24 27 26   GLUCOSE 229* 172* 265*  BUN 33* 38* 52*  CREATININE 1.61* 1.50* 1.93*  CALCIUM 8.8* 8.7* 8.6*   GFR: Estimated Creatinine Clearance: 27.1 mL/min (A) (by C-G formula based on SCr of 1.93 mg/dL (H)). Liver Function Tests: No results for input(s): AST, ALT, ALKPHOS, BILITOT, PROT, ALBUMIN in the last 168 hours. No results for input(s): LIPASE,  AMYLASE in the last 168 hours. No results for input(s): AMMONIA in the last 168 hours. Coagulation Profile: No results for input(s): INR, PROTIME in the last 168 hours. Cardiac Enzymes: No results for input(s): CKTOTAL, CKMB, CKMBINDEX, TROPONINI in the last 168 hours. BNP (last 3 results) No results for input(s): PROBNP in the last 8760 hours. HbA1C: Recent Labs    07/04/19 0659  HGBA1C 6.7*   CBG: Recent Labs  Lab 07/04/19 2001 07/05/19 0158 07/05/19 0511 07/05/19 0718 07/05/19 1214  GLUCAP 220* 230* 189* 190* 209*   Lipid Profile: Recent Labs    07/04/19 0659  CHOL 157  HDL 66  LDLCALC 80  TRIG 55  CHOLHDL 2.4   Thyroid Function Tests: No results for input(s): TSH, T4TOTAL, FREET4, T3FREE, THYROIDAB in the last 72 hours. Anemia Panel: No results for input(s): VITAMINB12, FOLATE, FERRITIN, TIBC, IRON, RETICCTPCT in the last 72 hours. Urine analysis:    Component Value Date/Time   COLORURINE YELLOW (A) 05/09/2019 2335   APPEARANCEUR CLEAR (A) 05/09/2019 2335   APPEARANCEUR Hazy 07/26/2013 1013   LABSPEC 1.038 (H) 05/09/2019 2335   LABSPEC 1.029 07/26/2013 1013   PHURINE 5.0 05/09/2019 2335   GLUCOSEU 50 (A) 05/09/2019 2335   GLUCOSEU Negative 07/26/2013 1013   HGBUR NEGATIVE 05/09/2019 2335   BILIRUBINUR NEGATIVE 05/09/2019 2335   BILIRUBINUR Negative 07/26/2013 1013   KETONESUR 5 (A) 05/09/2019 2335  PROTEINUR 30 (A) 05/09/2019 2335   NITRITE NEGATIVE 05/09/2019 2335   LEUKOCYTESUR NEGATIVE 05/09/2019 2335   LEUKOCYTESUR Negative 07/26/2013 1013     Alicea Wente M.D. [facility] 07/05/2019, 1:29 PM   Call night coverage person covering after 7pm

## 2019-07-06 DIAGNOSIS — N1831 Chronic kidney disease, stage 3a: Secondary | ICD-10-CM

## 2019-07-06 DIAGNOSIS — E1129 Type 2 diabetes mellitus with other diabetic kidney complication: Secondary | ICD-10-CM

## 2019-07-06 DIAGNOSIS — E785 Hyperlipidemia, unspecified: Secondary | ICD-10-CM

## 2019-07-06 DIAGNOSIS — J441 Chronic obstructive pulmonary disease with (acute) exacerbation: Secondary | ICD-10-CM

## 2019-07-06 DIAGNOSIS — N179 Acute kidney failure, unspecified: Secondary | ICD-10-CM

## 2019-07-06 LAB — CBC
HCT: 30.4 % — ABNORMAL LOW (ref 36.0–46.0)
Hemoglobin: 9.6 g/dL — ABNORMAL LOW (ref 12.0–15.0)
MCH: 27 pg (ref 26.0–34.0)
MCHC: 31.6 g/dL (ref 30.0–36.0)
MCV: 85.4 fL (ref 80.0–100.0)
Platelets: 350 10*3/uL (ref 150–400)
RBC: 3.56 MIL/uL — ABNORMAL LOW (ref 3.87–5.11)
RDW: 16.6 % — ABNORMAL HIGH (ref 11.5–15.5)
WBC: 9.8 10*3/uL (ref 4.0–10.5)
nRBC: 0.9 % — ABNORMAL HIGH (ref 0.0–0.2)

## 2019-07-06 LAB — BASIC METABOLIC PANEL
Anion gap: 9 (ref 5–15)
BUN: 64 mg/dL — ABNORMAL HIGH (ref 8–23)
CO2: 25 mmol/L (ref 22–32)
Calcium: 8.5 mg/dL — ABNORMAL LOW (ref 8.9–10.3)
Chloride: 107 mmol/L (ref 98–111)
Creatinine, Ser: 1.81 mg/dL — ABNORMAL HIGH (ref 0.44–1.00)
GFR calc Af Amer: 31 mL/min — ABNORMAL LOW (ref >60)
GFR calc non Af Amer: 27 mL/min — ABNORMAL LOW (ref >60)
Glucose, Bld: 208 mg/dL — ABNORMAL HIGH (ref 70–99)
Potassium: 3.9 mmol/L (ref 3.5–5.1)
Sodium: 141 mmol/L (ref 135–145)

## 2019-07-06 LAB — GLUCOSE, CAPILLARY
Glucose-Capillary: 134 mg/dL — ABNORMAL HIGH (ref 70–99)
Glucose-Capillary: 135 mg/dL — ABNORMAL HIGH (ref 70–99)
Glucose-Capillary: 144 mg/dL — ABNORMAL HIGH (ref 70–99)
Glucose-Capillary: 184 mg/dL — ABNORMAL HIGH (ref 70–99)
Glucose-Capillary: 208 mg/dL — ABNORMAL HIGH (ref 70–99)
Glucose-Capillary: 213 mg/dL — ABNORMAL HIGH (ref 70–99)
Glucose-Capillary: 227 mg/dL — ABNORMAL HIGH (ref 70–99)

## 2019-07-06 LAB — HEPARIN LEVEL (UNFRACTIONATED)
Heparin Unfractionated: 0.56 IU/mL (ref 0.30–0.70)
Heparin Unfractionated: 0.63 IU/mL (ref 0.30–0.70)

## 2019-07-06 MED ORDER — PREDNISONE 50 MG PO TABS
50.0000 mg | ORAL_TABLET | Freq: Every day | ORAL | Status: AC
  Administered 2019-07-06 – 2019-07-07 (×2): 50 mg via ORAL
  Filled 2019-07-06 (×2): qty 1

## 2019-07-06 MED ORDER — AMIODARONE HCL 200 MG PO TABS
400.0000 mg | ORAL_TABLET | Freq: Two times a day (BID) | ORAL | Status: DC
  Administered 2019-07-06 – 2019-07-08 (×5): 400 mg via ORAL
  Filled 2019-07-06 (×5): qty 2

## 2019-07-06 MED ORDER — METOPROLOL SUCCINATE ER 50 MG PO TB24
75.0000 mg | ORAL_TABLET | Freq: Two times a day (BID) | ORAL | Status: DC
  Administered 2019-07-06 – 2019-07-08 (×4): 75 mg via ORAL
  Filled 2019-07-06 (×4): qty 1

## 2019-07-06 MED ORDER — RIVAROXABAN 15 MG PO TABS
15.0000 mg | ORAL_TABLET | Freq: Every day | ORAL | Status: DC
  Administered 2019-07-06 – 2019-07-12 (×7): 15 mg via ORAL
  Filled 2019-07-06 (×8): qty 1

## 2019-07-06 NOTE — Care Management Important Message (Signed)
Important Message  Patient Details  Name: Cheryl [name] MRN: [id number] Date of Birth: May 07, 1940   Medicare Important Message Given:  Yes     [name] 07/06/2019, 11:05 AM

## 2019-07-06 NOTE — Progress Notes (Signed)
PROGRESS NOTE    Cheryl [name]  WRU:[id number] DOB: [birth year] DOA: 07/03/2019 PCP: Letta Median, MD   Brief Narrative:  HPI on 07/03/2019 by Dr. Ivor Costa Cheryl [name] is a 79 y.o. female with medical history significant of hypertension, hyperlipidemia, diabetes mellitus, COPD, GERD, depression, anxiety, lung cancer (s/p of radiation therapy), dementia, iron deficiency anemia, CKD-3, who presents with shortness of breath.  Patient has dementia, and is a poor historian, therefore, most of the history is obtained by discussing the case with ED physician, per EMS report, and with the nursing staff.  History is limited. Patient has been having shortness of breath for several days, which has been progressively worsening.  Patient is not on oxygen at home, but requiring 4 L oxygen with oxygen saturation 100% in ED. Patient has dry cough, but denies chest pain.  No nausea, vomiting, diarrhea, abdominal pain.  Denies symptoms of UTI.  Patient moves all extremities.  Interim history Admitted for resp failure/ COPD Exacerbation.  Found to have elevated troponin with atrial flutter RVR, cardiology consulted.  Now transitioned off of amiodarone drip as well as heparin to oral medications. Assessment & Plan   Acute hypoxic respiratory failure secondary to COPD exacerbation as well as atrial fibrillation, CHF -Continues to have some wheezing on examination -Will transition from IV Solu-Medrol to oral prednisone -Continue azithromycin  -Continue to treat underlying conditions  Acute systolic/diastolic heart failure -Echocardiogram: EF of 30 to 35%, global hypokinesis, LVH, LV diastolic parameters indeterminate. -Cardiology consulted and appreciated-recommended transitioning metoprolol to metoprolol succinate 75 mg twice daily, defer adding ACE inhibitor or ARB in the setting of AKI and soft BP. -Currently has a net even fluid balance.  Atrial flutter with RVR -Patient was placed on  amiodarone drip as her rate was difficult to control with calcium channel or beta blockers -Has now been transitioned to oral-amiodarone 400 mg twice daily, metoprolol 75 mg twice daily -Patient may need cardioversion within a few weeks versus a different strategy -Was on heparin drip has now been transitioned to Xarelto  Acute kidney injury on chronic kidney disease, stage IIIa -On admission, creatinine was 1.6, peaked to 1.9 (baseline of 1.1-1.2) -Lasix, lisinopril held -Creatinine down to 1.8 -Continue to monitor BMP  Diabetes mellitus, type II -metformin held -Recent A1c 6.6 -Continue Lantus, insulin sliding scale and CBG monitoring  Obstructive sleep apnea -Continue CPAP  Essential hypertension -Continue Cardizem, metoprolol -Lisinopril held  Hyperlipidemia -Continue statin  Dementia -Continue Aricept, Namenda  Obesity -BMI of 36 -Patient to follow-up with PCP for lifestyle modification  DVT Prophylaxis  Heparin --> Xarelto  Code Status: Full  Family Communication: None at bedside  Disposition Plan:  Status is: Inpatient  Remains inpatient appropriate because:Inpatient level of care appropriate due to severity of illness- being treated for AFlutter, CHF- has just been transitioned to oral meds today, will continue to monitor for another 24 hours, or when cardiology signs off.   Dispo: The patient is from: ALF              Anticipated d/c is to: SNF              Anticipated d/c date is: 2 days              Patient currently is not medically stable to d/c.   Consultants Cardiology  Procedures  Echocardiogram  Antibiotics   Anti-infectives (From admission, onward)   Start     Dose/Rate Route Frequency Ordered Stop   07/04/19 1000  azithromycin (ZITHROMAX) tablet 250 mg     Discontinue    "Followed by" Linked Group Details   250 mg Oral Daily 07/03/19 1837 07/08/19 0959   07/03/19 1845  azithromycin (ZITHROMAX) tablet 500 mg       "Followed by" Linked  Group Details   500 mg Oral Daily 07/03/19 1837 07/03/19 1853      Subjective:   Cyrstal Leitz seen and examined today.  Patient with dementia and continuously repeats herself.  Can answer some simple questions.  Objective:   Vitals:   07/06/19 0738 07/06/19 0900 07/06/19 1100 07/06/19 1126  BP: 130/88   (!) 112/92  Pulse: 96 (!) 101 (!) 103 97  Resp: 18 (!) 21 (!) 27 20  Temp: 97.9 F (36.6 C)   98.3 F (36.8 C)  TempSrc: Axillary   Axillary  SpO2: 95% 97% 98% 97%  Weight:      Height:        Intake/Output Summary (Last 24 hours) at 07/06/2019 1509 Last data filed at 07/06/2019 1300 Gross per 24 hour  Intake 1342.51 ml  Output 900 ml  Net 442.51 ml   Filed Weights   07/04/19 0451 07/05/19 0500 07/06/19 0419  Weight: 93.4 kg 93.9 kg 95.7 kg    Exam  General: Well developed, well nourished, NAD, appears stated age  50: NCAT, mucous membranes moist.   Cardiovascular: S1 S2 auscultated, irregular  Respiratory: Diminished breath sounds, no wheezing  Abdomen: Soft, nontender, nondistended, + bowel sounds  Extremities: warm dry without cyanosis clubbing or edema  Neuro: AAOx1 (self only), dementia, follows some commands  Data Reviewed: I have personally reviewed following labs and imaging studies  CBC: Recent Labs  Lab 07/03/19 0947 07/04/19 0659 07/05/19 0207 07/06/19 0027  WBC 7.1 6.5 8.0 9.8  HGB 10.5* 9.9* 9.7* 9.6*  HCT 35.6* 32.7* 32.3* 30.4*  MCV 91.5 88.6 88.5 85.4  PLT 357 378 351 341   Basic Metabolic Panel: Recent Labs  Lab 07/03/19 0947 07/04/19 0659 07/05/19 0207 07/06/19 0027  NA 145 142 143 141  K 3.7 4.1 4.1 3.9  CL 111 108 107 107  CO2 24 27 26 25   GLUCOSE 229* 172* 265* 208*  BUN 33* 38* 52* 64*  CREATININE 1.61* 1.50* 1.93* 1.81*  CALCIUM 8.8* 8.7* 8.6* 8.5*   GFR: Estimated Creatinine Clearance: 29.2 mL/min (A) (by C-G formula based on SCr of 1.81 mg/dL (H)). Liver Function Tests: No results for input(s): AST, ALT,  ALKPHOS, BILITOT, PROT, ALBUMIN in the last 168 hours. No results for input(s): LIPASE, AMYLASE in the last 168 hours. No results for input(s): AMMONIA in the last 168 hours. Coagulation Profile: No results for input(s): INR, PROTIME in the last 168 hours. Cardiac Enzymes: No results for input(s): CKTOTAL, CKMB, CKMBINDEX, TROPONINI in the last 168 hours. BNP (last 3 results) No results for input(s): PROBNP in the last 8760 hours. HbA1C: Recent Labs    07/04/19 0659  HGBA1C 6.7*   CBG: Recent Labs  Lab 07/05/19 2049 07/06/19 0002 07/06/19 0419 07/06/19 0812 07/06/19 1128  GLUCAP 245* 184* 134* 135* 213*   Lipid Profile: Recent Labs    07/04/19 0659  CHOL 157  HDL 66  LDLCALC 80  TRIG 55  CHOLHDL 2.4   Thyroid Function Tests: Recent Labs    07/05/19 1252  TSH 0.307*   Anemia Panel: No results for input(s): VITAMINB12, FOLATE, FERRITIN, TIBC, IRON, RETICCTPCT in the last 72 hours. Urine analysis:    Component Value Date/Time  COLORURINE YELLOW (A) 05/09/2019 2335   APPEARANCEUR CLEAR (A) 05/09/2019 2335   APPEARANCEUR Hazy 07/26/2013 1013   LABSPEC 1.038 (H) 05/09/2019 2335   LABSPEC 1.029 07/26/2013 1013   PHURINE 5.0 05/09/2019 2335   GLUCOSEU 50 (A) 05/09/2019 2335   GLUCOSEU Negative 07/26/2013 1013   HGBUR NEGATIVE 05/09/2019 2335   BILIRUBINUR NEGATIVE 05/09/2019 2335   BILIRUBINUR Negative 07/26/2013 1013   KETONESUR 5 (A) 05/09/2019 2335   PROTEINUR 30 (A) 05/09/2019 2335   NITRITE NEGATIVE 05/09/2019 2335   LEUKOCYTESUR NEGATIVE 05/09/2019 2335   LEUKOCYTESUR Negative 07/26/2013 1013   Sepsis Labs: @LABRCNTIP (procalcitonin:4,lacticidven:4)  ) Recent Results (from the past 240 hour(s))  SARS Coronavirus 2 by RT PCR (hospital order, performed in [place] hospital lab) Nasopharyngeal Nasopharyngeal Swab     Status: None   Collection Time: 07/03/19  2:19 PM   Specimen: Nasopharyngeal Swab  Result Value Ref Range Status   SARS Coronavirus  2 NEGATIVE NEGATIVE Final    Comment: (NOTE) SARS-CoV-2 target nucleic acids are NOT DETECTED.  The SARS-CoV-2 RNA is generally detectable in upper and lower respiratory specimens during the acute phase of infection. The lowest concentration of SARS-CoV-2 viral copies this assay can detect is 250 copies / mL. A negative result does not preclude SARS-CoV-2 infection and should not be used as the sole basis for treatment or other patient management decisions.  A negative result may occur with improper specimen collection / handling, submission of specimen other than nasopharyngeal swab, presence of viral mutation(s) within the areas targeted by this assay, and inadequate number of viral copies (<250 copies / mL). A negative result must be combined with clinical observations, patient history, and epidemiological information.  Fact Sheet for Patients:   StrictlyIdeas.no  Fact Sheet for Healthcare Providers: BankingDealers.co.za  This test is not yet approved or  cleared by the [place] FDA and has been authorized for detection and/or diagnosis of SARS-CoV-2 by FDA under an Emergency Use Authorization (EUA).  This EUA will remain in effect (meaning this test can be used) for the duration of the COVID-19 declaration under Section 564(b)(1) of the Act, 21 U.S.C. section 360bbb-3(b)(1), unless the authorization is terminated or revoked sooner.  Performed at [facility], [address] [place], [place] [address]   MRSA PCR Screening     Status: None   Collection Time: 07/04/19 12:20 PM   Specimen: Nasopharyngeal  Result Value Ref Range Status   MRSA by PCR NEGATIVE NEGATIVE Final    Comment:        The GeneXpert MRSA Assay (FDA approved for NASAL specimens only), is one component of a comprehensive MRSA colonization surveillance program. It is not intended to diagnose MRSA infection nor to guide or monitor treatment  for MRSA infections. Performed at [facility], [address], [place], [place] [address]       Radiology Studies: No results found.   Scheduled Meds:  ALPRAZolam  0.25 mg Oral BID   amiodarone  400 mg Oral BID   atorvastatin  20 mg Oral QHS   azithromycin  250 mg Oral Daily   dextromethorphan-guaiFENesin  1 tablet Oral BID   memantine  28 mg Oral Daily   And   donepezil  10 mg Oral QHS   ferrous sulfate  325 mg Oral BID WC   fluticasone  2 spray Each Nare Daily   insulin aspart  0-9 Units Subcutaneous Q4H   insulin glargine  7 Units Subcutaneous Daily   levalbuterol  0.63 mg Nebulization Q8H   metoprolol succinate  75 mg Oral BID   metoprolol tartrate  37.5 mg Oral Q6H   modafinil  100 mg Oral Daily   mometasone-formoterol  2 puff Inhalation BID   pantoprazole  40 mg Oral Daily   perphenazine  4 mg Oral TID   predniSONE  50 mg Oral Q breakfast   QUEtiapine  200 mg Oral QHS   rivaroxaban  15 mg Oral Q supper   Continuous Infusions:   LOS: 3 days   Time Spent in minutes   45 minutes  Taquila Leys D.O. on 07/06/2019 at 3:09 PM  Between 7am to 7pm - Please see pager noted on [email or web address]  After 7pm go to [email or web address]  And look for the night coverage person covering for me after hours  [facility] Office  [phone]

## 2019-07-06 NOTE — Progress Notes (Signed)
ANTICOAGULATION CONSULT NOTE - Follow up [facility] for Heparin Drip Indication: atrial fibrillation  No Known Allergies  Patient Measurements: Height: 5\' 4"  (162.6 cm) Weight: 93.9 kg (207 lb 1.6 oz) IBW/kg (Calculated) : 54.7 Heparin Dosing Weight: 75.8 kg  Vital Signs: Temp: 98.2 F (36.8 C) (06/22 2226) Temp Source: Oral (06/22 2226) BP: 118/87 (06/23 0000) Pulse Rate: 89 (06/23 0000)  Labs: Recent Labs    07/03/19 0947 07/03/19 1147 07/03/19 1526 07/03/19 1918 07/04/19 0659 07/04/19 0659 07/05/19 0207 07/05/19 0207 07/05/19 1252 07/05/19 1431 07/06/19 0027  HGB   < >  --   --   --  9.9*   < > 9.7*  --   --   --  9.6*  HCT   < >  --   --   --  32.7*  --  32.3*  --   --   --  30.4*  PLT   < >  --   --   --  378  --  351  --   --   --  350  HEPARINUNFRC  --   --   --   --   --   --  1.16*   < > 1.08* 1.07* 0.63  CREATININE   < >  --   --   --  1.50*  --  1.93*  --   --   --  1.81*  TROPONINIHS  --  53* 42* 43*  --   --   --   --   --   --   --    < > = values in this interval not displayed.    Estimated Creatinine Clearance: 28.9 mL/min (A) (by C-G formula based on SCr of 1.81 mg/dL (H)).  Assessment: Patient is a 79 yo female, pharmacy has been consulted to manage Heparin drip for afib. Patient was previously on Enoxaparin 40mg  SQ q24h with last dose received on 6/20 ~22:00.  Heparin course: Heparin 4500 units IV x1 then 1150 units/hr 6/22: HL @ 0207 = 1.16 - Will hold heparin drip for 1 hr and restart @ 900 units/hr.   Goal of Therapy:  Heparin level 0.3-0.7 units/ml Monitor platelets by anticoagulation protocol: Yes   Plan:  6/22 @ 1252 HL = 1.08 - not much decrease despite holding drip and significant rate decrease, looks like lab may have been drawn from same arm as IV 6/22 @ 1431 HL = 1.07 on repeat - per RN, drawn from opposite arm from IV site Heparin level >1, will hold for one hour, then restart heparin at 700 units/hr Recheck 8h  after restart, CBC in AM No s/sx of bleeding per RN  6/23:  HL @ 0030 = 0.63 Will continue pt on current rate and recheck HL in 8 hrs on 6/23 @ 0830.   Pharmacy will continue to follow  Thuan Tippett D 07/06/2019 1:17 AM

## 2019-07-06 NOTE — Progress Notes (Signed)
Progress Note  Patient Name: Cheryl [name] Date of Encounter: 07/06/2019  Primary Cardiologist: Fletcher Anon  Subjective   Patient without complaints, though she remains disoriented.  No chest pain, shortness of breath, or palpitations.  Inpatient Medications    Scheduled Meds: . ALPRAZolam  0.25 mg Oral BID  . atorvastatin  20 mg Oral QHS  . azithromycin  250 mg Oral Daily  . dextromethorphan-guaiFENesin  1 tablet Oral BID  . memantine  28 mg Oral Daily   And  . donepezil  10 mg Oral QHS  . ferrous sulfate  325 mg Oral BID WC  . fluticasone  2 spray Each Nare Daily  . insulin aspart  0-9 Units Subcutaneous Q4H  . insulin glargine  7 Units Subcutaneous Daily  . levalbuterol  0.63 mg Nebulization Q8H  . metoprolol tartrate  37.5 mg Oral Q6H  . modafinil  100 mg Oral Daily  . mometasone-formoterol  2 puff Inhalation BID  . pantoprazole  40 mg Oral Daily  . perphenazine  4 mg Oral TID  . predniSONE  50 mg Oral Q breakfast  . QUEtiapine  200 mg Oral QHS   Continuous Infusions: . amiodarone 30 mg/hr (07/05/19 2219)  . heparin 700 Units/hr (07/05/19 1630)   PRN Meds: acetaminophen, albuterol, alum & mag hydroxide-simeth, hydrALAZINE, magnesium hydroxide, metoprolol tartrate, ondansetron (ZOFRAN) IV   Vital Signs    Vitals:   07/06/19 0419 07/06/19 0500 07/06/19 0700 07/06/19 0738  BP: 121/68   130/88  Pulse: (!) 102   96  Resp:  19 20 18   Temp: (!) 97.4 F (36.3 C)   97.9 F (36.6 C)  TempSrc: Oral   Axillary  SpO2: 97%   95%  Weight: 95.7 kg     Height:        Intake/Output Summary (Last 24 hours) at 07/06/2019 1011 Last data filed at 07/06/2019 0421 Gross per 24 hour  Intake 922.34 ml  Output 1150 ml  Net -227.66 ml   Filed Weights   07/04/19 0451 07/05/19 0500 07/06/19 0419  Weight: 93.4 kg 93.9 kg 95.7 kg    Physical Exam   GEN: Well nourished, well developed, in no acute distress.  HEENT: Grossly normal.  Neck: No JVD, though body habitus limits  evaluation. Cardiac: Irregular w/o murmurs. Respiratory:  Poor inspiratory effort.  No wheezes or crackles. GI: Soft, nontender, nondistended. MS: No LE edema.  Labs    Chemistry Recent Labs  Lab 07/04/19 0659 07/05/19 0207 07/06/19 0027  NA 142 143 141  K 4.1 4.1 3.9  CL 108 107 107  CO2 27 26 25   GLUCOSE 172* 265* 208*  BUN 38* 52* 64*  CREATININE 1.50* 1.93* 1.81*  CALCIUM 8.7* 8.6* 8.5*  GFRNONAA 33* 25* 27*  GFRAA 39* 28* 31*  ANIONGAP 7 10 9      Hematology Recent Labs  Lab 07/04/19 0659 07/05/19 0207 07/06/19 0027  WBC 6.5 8.0 9.8  RBC 3.69* 3.65* 3.56*  HGB 9.9* 9.7* 9.6*  HCT 32.7* 32.3* 30.4*  MCV 88.6 88.5 85.4  MCH 26.8 26.6 27.0  MCHC 30.3 30.0 31.6  RDW 16.4* 16.7* 16.6*  PLT 378 351 350    Cardiac Enzymes  Recent Labs  Lab 07/03/19 0947 07/03/19 1147 07/03/19 1526 07/03/19 1918  TROPONINIHS 50* 53* 42* 43*      BNP Recent Labs  Lab 07/03/19 0947  BNP 1,303.1*     DDimer No results for input(s): DDIMER in the last 168 hours.   Radiology  DG Chest 2 View  Result Date: 07/03/2019 CLINICAL DATA:  Shortness of breath and hypoxia. History of lung cancer post radiation. EXAM: CHEST - 2 VIEW COMPARISON:  05/09/2019 and CT 05/09/2019 FINDINGS: Patient slightly rotated to the left. Lungs are adequately inflated without focal airspace consolidation, effusion or pneumothorax. Known left perihilar scarring in this patient with history of lung cancer post radiation. Mild stable cardiomegaly. Remainder of the exam is unchanged. Minimal linear scarring over the left upper lung. IMPRESSION: 1.  No acute findings. 2. Mild stable cardiomegaly. Stable scarring over the left hilar region in this patient with history of lung cancer post radiation. Electronically Signed   By: Marin Olp M.D.   On: 07/03/2019 11:01   CT Angio Chest PE W and/or Wo Contrast  Result Date: 07/03/2019 CLINICAL DATA:  Shortness of breath.  Wheezing.  Tachycardia. EXAM: CT  ANGIOGRAPHY CHEST WITH CONTRAST TECHNIQUE: Multidetector CT imaging of the chest was performed using the standard protocol during bolus administration of intravenous contrast. Multiplanar CT image reconstructions and MIPs were obtained to evaluate the vascular anatomy. CONTRAST:  61mL OMNIPAQUE IOHEXOL 350 MG/ML SOLN COMPARISON:  CT scan dated 05/09/2019 FINDINGS: Cardiovascular: Satisfactory opacification of the pulmonary arteries to the segmental level. No evidence of pulmonary embolism. Borderline cardiomegaly. Trace pericardial effusion. Aortic atherosclerosis. Scattered coronary artery calcifications. Mediastinum/Nodes: There is bronchomalacia of the mainstem bronchi bilaterally. Trachea appears normal. Esophagus is normal. Large chronic hiatal hernia. Lungs/Pleura: Stable parenchymal scarring anteromedially in the right hemithorax and in the left perihilar region and at the left lung base, unchanged. Tiny new right effusion. Upper Abdomen: No acute abnormality. Stable 17 mm low-density lesion in the right lobe of the liver consistent with a cyst. Musculoskeletal: No chest wall abnormality. No acute or significant osseous findings. Review of the MIP images confirms the above findings. IMPRESSION: 1. No pulmonary emboli. 2. Bronchomalacia of mainstem bronchi bilaterally. Both mainstem bronchi are markedly narrowed. 3. Tiny new right effusion. 4. Stable bilateral parenchymal scarring. 5. Large chronic hiatal hernia. 6.  Aortic Atherosclerosis (ICD10-I70.0). Aortic Atherosclerosis (ICD10-I70.0). Electronically Signed   By: Lorriane Shire M.D.   On: 07/03/2019 13:05   ECHOCARDIOGRAM COMPLETE  Result Date: 07/04/2019    ECHOCARDIOGRAM REPORT   Patient Name:   Cheryl [name] Date of Exam: 07/04/2019 Medical Rec #:  [id number]      Height:       64.0 in Accession #:    [id number]     Weight:       205.8 lb Date of Birth:  [birth year]      BSA:          1.980 m Patient Age:    79 years       BP:           134/87 mmHg  Patient Gender: F              HR:           124 bpm. Exam Location:  [facility] Procedure: Color Doppler and Cardiac Doppler Indications:     I50.31 CHF-Acute Diastolic  History:         Patient has no prior history of Echocardiogram examinations.                  COPD; Risk Factors:Hypertension, Dyslipidemia and Diabetes.  Sonographer:     Charmayne Sheer RDCS (AE) Referring Phys:  Baker Janus Soledad Gerlach NIU Diagnosing Phys: Kathlyn Sacramento MD  Sonographer Comments: Suboptimal apical window and suboptimal subcostal window. Image  acquisition challenging due to COPD. IMPRESSIONS  1. Left ventricular ejection fraction, by estimation, is 30 to 35%. The left ventricle has moderately decreased function. The left ventricle demonstrates global hypokinesis. There is mild left ventricular hypertrophy. Left ventricular diastolic parameters are indeterminate.  2. Right ventricular systolic function is mildly reduced. The right ventricular size is mildly enlarged. There is mildly elevated pulmonary artery systolic pressure.  3. Left atrial size was mildly dilated.  4. Right atrial size was moderately dilated.  5. The mitral valve is normal in structure. Trivial mitral valve regurgitation. No evidence of mitral stenosis.  6. Tricuspid valve regurgitation is moderate.  7. The aortic valve is normal in structure. Aortic valve regurgitation is trivial. Mild aortic valve sclerosis is present, with no evidence of aortic valve stenosis.  8. Moderately dilated pulmonary artery. FINDINGS  Left Ventricle: Left ventricular ejection fraction, by estimation, is 30 to 35%. The left ventricle has moderately decreased function. The left ventricle demonstrates global hypokinesis. The left ventricular internal cavity size was normal in size. There is mild left ventricular hypertrophy. Left ventricular diastolic parameters are indeterminate. Right Ventricle: The right ventricular size is mildly enlarged. No increase in right ventricular wall thickness. Right  ventricular systolic function is mildly reduced. There is mildly elevated pulmonary artery systolic pressure. The tricuspid regurgitant  velocity is 2.95 m/s, and with an assumed right atrial pressure of 10 mmHg, the estimated right ventricular systolic pressure is 85.2 mmHg. Left Atrium: Left atrial size was mildly dilated. Right Atrium: Right atrial size was moderately dilated. Pericardium: There is no evidence of pericardial effusion. Mitral Valve: The mitral valve is normal in structure. Normal mobility of the mitral valve leaflets. Mild mitral annular calcification. Trivial mitral valve regurgitation. No evidence of mitral valve stenosis. MV peak gradient, 6.0 mmHg. The mean mitral valve gradient is 3.0 mmHg. Tricuspid Valve: The tricuspid valve is normal in structure. Tricuspid valve regurgitation is moderate . No evidence of tricuspid stenosis. Aortic Valve: The aortic valve is normal in structure. Aortic valve regurgitation is trivial. Mild aortic valve sclerosis is present, with no evidence of aortic valve stenosis. Aortic valve mean gradient measures 2.0 mmHg. Aortic valve peak gradient measures 2.9 mmHg. Aortic valve area, by VTI measures 2.98 cm. Pulmonic Valve: The pulmonic valve was normal in structure. Pulmonic valve regurgitation is mild. No evidence of pulmonic stenosis. Aorta: The aortic root is normal in size and structure. Pulmonary Artery: The pulmonary artery is moderately dilated. Venous: The inferior vena cava was not well visualized. IAS/Shunts: No atrial level shunt detected by color flow Doppler.  LEFT VENTRICLE PLAX 2D LVIDd:         4.31 cm  Diastology LVIDs:         3.75 cm  LV e' lateral:   8.38 cm/s LV PW:         1.25 cm  LV E/e' lateral: 14.8 LV IVS:        1.02 cm  LV e' medial:    5.11 cm/s LVOT diam:     2.00 cm  LV E/e' medial:  24.3 LV SV:         35 LV SV Index:   18 LVOT Area:     3.14 cm  RIGHT VENTRICLE RV Basal diam:  4.15 cm LEFT ATRIUM             Index       RIGHT  ATRIUM           Index LA diam:  3.60 cm 1.82 cm/m  RA Area:     25.90 cm LA Vol (A2C):   29.1 ml 14.69 ml/m RA Volume:   91.70 ml  46.31 ml/m LA Vol (A4C):   49.7 ml 25.10 ml/m LA Biplane Vol: 40.1 ml 20.25 ml/m  AORTIC VALVE                   PULMONIC VALVE AV Area (Vmax):    2.84 cm    PV Vmax:       0.66 m/s AV Area (Vmean):   2.71 cm    PV Vmean:      50.800 cm/s AV Area (VTI):     2.98 cm    PV VTI:        0.087 m AV Vmax:           84.60 cm/s  PV Peak grad:  1.8 mmHg AV Vmean:          61.400 cm/s PV Mean grad:  1.0 mmHg AV VTI:            0.117 m AV Peak Grad:      2.9 mmHg AV Mean Grad:      2.0 mmHg LVOT Vmax:         76.50 cm/s LVOT Vmean:        53.000 cm/s LVOT VTI:          0.111 m LVOT/AV VTI ratio: 0.95  AORTA Ao Root diam: 3.60 cm MITRAL VALVE                TRICUSPID VALVE MV Area (PHT): 6.88 cm     TR Peak grad:   34.8 mmHg MV Peak grad:  6.0 mmHg     TR Vmax:        295.00 cm/s MV Mean grad:  3.0 mmHg MV Vmax:       1.22 m/s     SHUNTS MV Vmean:      81.6 cm/s    Systemic VTI:  0.11 m MV Decel Time: 110 msec     Systemic Diam: 2.00 cm MV E velocity: 124.00 cm/s Kathlyn Sacramento MD Electronically signed by Kathlyn Sacramento MD Signature Date/Time: 07/04/2019/12:31:41 PM    Final     Telemetry    Atrial flutter with variable block; ventricular rates 80-110 bpm - Personally Reviewed  ECG    No new tracing.  Cardiac Studies   2D Echocardiogram 6.2021  . Left ventricular ejection fraction, by estimation, is 30 to 35%. The  left ventricle has moderately decreased function. The left ventricle  demonstrates global hypokinesis. There is mild left ventricular  hypertrophy. Left ventricular diastolic  parameters are indeterminate.   2. Right ventricular systolic function is mildly reduced. The right  ventricular size is mildly enlarged. There is mildly elevated pulmonary  artery systolic pressure.   3. Left atrial size was mildly dilated.   4. Right atrial size was  moderately dilated.   5. The mitral valve is normal in structure. Trivial mitral valve  regurgitation. No evidence of mitral stenosis.   6. Tricuspid valve regurgitation is moderate.   7. The aortic valve is normal in structure. Aortic valve regurgitation is  trivial. Mild aortic valve sclerosis is present, with no evidence of  aortic valve stenosis.   8. Moderately dilated pulmonary artery.  _____________    Patient Profile     79 y.o. female with history of DM2, HTN, HLD, cognitive impairment/dementia, COPD, CKD stage III, lung cancer  s/p radiation therapy, iron deficiency anemia, depression, anxiety, and GERDwho is being seen today for the evaluation of acute systolic CHF and atrial flutter with RVR.  Assessment & Plan    Acute HFrEF: Ms. Eastburn appears grossly euvolemic, though body habitus limits evaluation.  She was essentially net even yesterday, with slight improvement in creatinine.  Transition metoprolol to metoprolol succinate 75 mg BID.  Defer adding ACEI/ARB in the setting of AKI and soft BP.  Maintain net even fluid balance.  Atrial flutter with variable block: Ventricular rates reasonably well-controlled.  Transition metoprolol tartrate to metoprolol succinate 75 mg BID.  Transition IV amiodarone to PO amiodarone 400 mg BID.  If she does not spontaneously convert to sinus rhythm in the next few weeks, we will need to readdress cardioversion versus discontinuation of amiodarone and long-term rate control strategy.  Transition heparin to rivaroxaban 15 mg daily, adjusted for renal function.  COPD exacerbation: Breathing comfortably.  Per IM.  Wean steroids when possible.  Elevated troponin: Most likely supply-demand mismatch.  Given comorbidities, most notably advanced dementia, I recommend agains ischemia evaluation.  Acute on chronic kidney disease: Slight improvement in creatinine from yesterday.  Diuresis on hold; maintain net even fluid  balance.  Avoid nephrotoxic drugs.  Signed, Nelva Bush, MD  07/06/2019, 10:11 AM   For questions or updates, please contact   Please consult [email or web address] for contact info under [facility].

## 2019-07-06 NOTE — Progress Notes (Signed)
ANTICOAGULATION CONSULT NOTE - Follow up [place] for Heparin Drip Indication: atrial fibrillation  No Known Allergies  Patient Measurements: Height: 5\' 4"  (162.6 cm) Weight: 95.7 kg (210 lb 14.4 oz) IBW/kg (Calculated) : 54.7 Heparin Dosing Weight: 75.8 kg  Vital Signs: Temp: 97.9 F (36.6 C) (06/23 0738) Temp Source: Axillary (06/23 0738) BP: 130/88 (06/23 0738) Pulse Rate: 96 (06/23 0738)  Labs: Recent Labs    07/03/19 0947 07/03/19 1147 07/03/19 1526 07/03/19 1918 07/04/19 0659 07/04/19 0659 07/05/19 0207 07/05/19 1252 07/05/19 1431 07/06/19 0027 07/06/19 0851  HGB   < >  --   --   --  9.9*   < > 9.7*  --   --  9.6*  --   HCT   < >  --   --   --  32.7*  --  32.3*  --   --  30.4*  --   PLT   < >  --   --   --  378  --  351  --   --  350  --   HEPARINUNFRC  --   --   --   --   --   --  1.16*   < > 1.07* 0.63 0.56  CREATININE   < >  --   --   --  1.50*  --  1.93*  --   --  1.81*  --   TROPONINIHS  --  53* 42* 43*  --   --   --   --   --   --   --    < > = values in this interval not displayed.    Estimated Creatinine Clearance: 29.2 mL/min (A) (by C-G formula based on SCr of 1.81 mg/dL (H)).  Assessment: Patient is a 79 yo female, pharmacy has been consulted to manage Heparin drip for afib. Patient was previously on Enoxaparin 40mg  SQ q24h with last dose received on 6/20 ~22:00.  6/23 0027 HL 0.63  6/23 0851 HL 0.56    Goal of Therapy:  Heparin level 0.3-0.7 units/ml Monitor platelets by anticoagulation protocol: Yes   Plan:  Heparin level is therapeutic x 2. Will continue current rate of heparin. Will order anti-xa and CBC with AM labs.    Pharmacy will continue to follow  Oswald Hillock, PharmD, BCPS 07/06/2019 9:29 AM

## 2019-07-07 LAB — BASIC METABOLIC PANEL
Anion gap: 10 (ref 5–15)
BUN: 53 mg/dL — ABNORMAL HIGH (ref 8–23)
CO2: 27 mmol/L (ref 22–32)
Calcium: 8.8 mg/dL — ABNORMAL LOW (ref 8.9–10.3)
Chloride: 103 mmol/L (ref 98–111)
Creatinine, Ser: 1.44 mg/dL — ABNORMAL HIGH (ref 0.44–1.00)
GFR calc Af Amer: 40 mL/min — ABNORMAL LOW (ref >60)
GFR calc non Af Amer: 35 mL/min — ABNORMAL LOW (ref >60)
Glucose, Bld: 172 mg/dL — ABNORMAL HIGH (ref 70–99)
Potassium: 4.3 mmol/L (ref 3.5–5.1)
Sodium: 140 mmol/L (ref 135–145)

## 2019-07-07 LAB — GLUCOSE, CAPILLARY
Glucose-Capillary: 146 mg/dL — ABNORMAL HIGH (ref 70–99)
Glucose-Capillary: 222 mg/dL — ABNORMAL HIGH (ref 70–99)
Glucose-Capillary: 265 mg/dL — ABNORMAL HIGH (ref 70–99)
Glucose-Capillary: 281 mg/dL — ABNORMAL HIGH (ref 70–99)
Glucose-Capillary: 298 mg/dL — ABNORMAL HIGH (ref 70–99)

## 2019-07-07 LAB — CBC
HCT: 37.2 % (ref 36.0–46.0)
Hemoglobin: 11.3 g/dL — ABNORMAL LOW (ref 12.0–15.0)
MCH: 26.8 pg (ref 26.0–34.0)
MCHC: 30.4 g/dL (ref 30.0–36.0)
MCV: 88.4 fL (ref 80.0–100.0)
Platelets: 400 10*3/uL (ref 150–400)
RBC: 4.21 MIL/uL (ref 3.87–5.11)
RDW: 16.6 % — ABNORMAL HIGH (ref 11.5–15.5)
WBC: 8.4 10*3/uL (ref 4.0–10.5)
nRBC: 0.8 % — ABNORMAL HIGH (ref 0.0–0.2)

## 2019-07-07 MED ORDER — LOSARTAN POTASSIUM 25 MG PO TABS
25.0000 mg | ORAL_TABLET | Freq: Every day | ORAL | Status: DC
  Administered 2019-07-07 – 2019-07-08 (×2): 25 mg via ORAL
  Filled 2019-07-07 (×2): qty 1

## 2019-07-07 NOTE — NC FL2 (Signed)
[place] LEVEL OF CARE SCREENING TOOL     IDENTIFICATION  Patient Name: Cheryl [name] Birthdate: May 01, 1940 Sex: female Admission Date (Current Location): 07/03/2019  [place] and [place] Number:  Engineering geologist and Address:  [facility], [address], [place], [place] [address]      Provider Number: [id number]  Attending Physician Name and Address:  Cristal Ford, DO  Relative Name and Phone Number:       Current Level of Care: Hospital Recommended Level of Care: [place] Prior Approval Number:    Date Approved/Denied:   PASRR Number: [id number]  Discharge Plan: SNF    Current Diagnoses: Patient Active Problem List   Diagnosis Date Noted  . Dementia ([place])   . Type II diabetes mellitus with renal manifestations ([place])   . Elevated troponin   . Acute on chronic respiratory failure with hypoxia ([place])   . Acute renal failure superimposed on stage 3a chronic kidney disease ([place])   . Acute respiratory failure with hypoxia ([place])   . Shortness of breath   . COPD with acute exacerbation ([place]) 05/09/2019  . Respiratory failure with hypoxia and hypercapnia ([place]) 05/09/2019  . Hyperglycemia due to diabetes mellitus ([place]) 05/09/2019  . GERD (gastroesophageal reflux disease) 05/09/2019  . HTN (hypertension) 05/09/2019  . HLD (hyperlipidemia) 05/09/2019  . Moderate dementia, with behavioral disturbance ([place]) 10/06/2016  . Incontinence of feces 05/30/2016  . COPD exacerbation ([place]) 01/08/2016  . Hypokalemia 02/07/2015  . Cancer of left lung ([place]) 07/25/2011    Orientation RESPIRATION BLADDER Height & Weight     Self  Normal External catheter, Incontinent (6/20) Weight: 204 lb 14.4 oz (92.9 kg) Height:  5\' 4"  (162.6 cm)  BEHAVIORAL SYMPTOMS/MOOD NEUROLOGICAL BOWEL NUTRITION STATUS      Incontinent Diet (heart healthy/carb modified  Fluid consistency: Thin; Fluid restriction: 1500 mL Fluid)  AMBULATORY STATUS  COMMUNICATION OF NEEDS Skin   Extensive Assist Verbally Normal                       Personal Care Assistance Level of Assistance  Bathing, Feeding, Dressing Bathing Assistance: Maximum assistance Feeding assistance: Limited assistance Dressing Assistance: Maximum assistance     Functional Limitations Info  Sight, Speech, Hearing Sight Info: Adequate Hearing Info: Adequate Speech Info: Adequate    SPECIAL CARE FACTORS FREQUENCY  PT (By licensed PT), OT (By licensed OT)     PT Frequency: 5x OT Frequency: 5x            Contractures Contractures Info: Not present    Additional Factors Info  Code Status, Allergies Code Status Info: Full Code Allergies Info: No known allergies           Current Medications (07/07/2019):  This is the current hospital active medication list Current Facility-Administered Medications  Medication Dose Route Frequency Provider Last Rate Last Admin  . acetaminophen (TYLENOL) tablet 650 mg  650 mg Oral Q6H PRN Ivor Costa, MD      . albuterol (PROVENTIL) (2.5 MG/3ML) 0.083% nebulizer solution 2.5 mg  2.5 mg Nebulization Q4H PRN Ivor Costa, MD      . ALPRAZolam Duanne Moron) tablet 0.25 mg  0.25 mg Oral BID Ivor Costa, MD   0.25 mg at 07/07/19 0916  . alum & mag hydroxide-simeth (MAALOX/MYLANTA) 200-200-20 MG/5ML suspension 30 mL  30 mL Oral Daily PRN Ivor Costa, MD      . amiodarone (PACERONE) tablet 400 mg  400 mg Oral BID  Nelva Bush, MD   400 mg at 07/07/19 0916  . atorvastatin (LIPITOR) tablet 20 mg  20 mg Oral QHS Ivor Costa, MD   20 mg at 07/06/19 2244  . dextromethorphan-guaiFENesin (MUCINEX DM) 30-600 MG per 12 hr tablet 1 tablet  1 tablet Oral BID Ivor Costa, MD   1 tablet at 07/07/19 0916  . memantine (NAMENDA XR) 24 hr capsule 28 mg  28 mg Oral Daily Lu Duffel, RPH   28 mg at 07/06/19 2243   And  . donepezil (ARICEPT) tablet 10 mg  10 mg Oral QHS Lu Duffel, RPH   10 mg at 07/06/19 2241  . ferrous sulfate  tablet 325 mg  325 mg Oral BID WC Ivor Costa, MD   325 mg at 07/07/19 0916  . fluticasone (FLONASE) 50 MCG/ACT nasal spray 2 spray  2 spray Each Nare Daily Ivor Costa, MD   2 spray at 07/07/19 0914  . hydrALAZINE (APRESOLINE) injection 5 mg  5 mg Intravenous Q2H PRN Ivor Costa, MD      . insulin aspart (novoLOG) injection 0-9 Units  0-9 Units Subcutaneous Q4H Ivor Costa, MD   5 Units at 07/07/19 1321  . insulin glargine (LANTUS) injection 7 Units  7 Units Subcutaneous Daily Ivor Costa, MD   7 Units at 07/07/19 [phone]  . levalbuterol (XOPENEX) nebulizer solution 0.63 mg  0.63 mg Nebulization Q8H Rai, Ripudeep K, MD   0.63 mg at 07/07/19 0756  . losartan (COZAAR) tablet 25 mg  25 mg Oral Daily Theora Gianotti, NP   25 mg at 07/07/19 1352  . magnesium hydroxide (MILK OF MAGNESIA) suspension 30 mL  30 mL Oral Daily PRN Ivor Costa, MD      . metoprolol succinate (TOPROL-XL) 24 hr tablet 75 mg  75 mg Oral BID End, Christopher, MD   75 mg at 07/07/19 0916  . metoprolol tartrate (LOPRESSOR) injection 2.5 mg  2.5 mg Intravenous Q3H PRN Ivor Costa, MD   2.5 mg at 07/04/19 1428  . modafinil (PROVIGIL) tablet 100 mg  100 mg Oral Daily Ivor Costa, MD   100 mg at 07/07/19 0916  . mometasone-formoterol (DULERA) 200-5 MCG/ACT inhaler 2 puff  2 puff Inhalation BID Ivor Costa, MD   2 puff at 07/07/19 0913  . ondansetron (ZOFRAN) injection 4 mg  4 mg Intravenous Q8H PRN Ivor Costa, MD      . pantoprazole (PROTONIX) EC tablet 40 mg  40 mg Oral Daily Ivor Costa, MD   40 mg at 07/07/19 0917  . perphenazine (TRILAFON) tablet 4 mg  4 mg Oral TID Ivor Costa, MD   4 mg at 07/07/19 0917  . QUEtiapine (SEROQUEL) tablet 200 mg  200 mg Oral QHS Ivor Costa, MD   200 mg at 07/06/19 2241  . Rivaroxaban (XARELTO) tablet 15 mg  15 mg Oral Q supper End, Christopher, MD   15 mg at 07/06/19 1050     Discharge Medications: Please see discharge summary for a list of discharge medications.  Relevant Imaging  Results:  Relevant Lab Results:   Additional Information TIR;[id number]  Cheryl Stanford, LCSW

## 2019-07-07 NOTE — Progress Notes (Signed)
PROGRESS NOTE    Cheryl [name]  EHM:[id number] DOB: 1940/03/08 DOA: 07/03/2019 PCP: Letta Median, MD   Brief Narrative:  HPI on 07/03/2019 by Dr. Ivor Costa [name] is a 79 y.o. female with medical history significant of hypertension, hyperlipidemia, diabetes mellitus, COPD, GERD, depression, anxiety, lung cancer (s/p of radiation therapy), dementia, iron deficiency anemia, CKD-3, who presents with shortness of breath.  Patient has dementia, and is a poor historian, therefore, most of the history is obtained by discussing the case with ED physician, per EMS report, and with the nursing staff.  History is limited. Patient has been having shortness of breath for several days, which has been progressively worsening.  Patient is not on oxygen at home, but requiring 4 L oxygen with oxygen saturation 100% in ED. Patient has dry cough, but denies chest pain.  No nausea, vomiting, diarrhea, abdominal pain.  Denies symptoms of UTI.  Patient moves all extremities.  Interim history Admitted for resp failure/ COPD Exacerbation.  Found to have elevated troponin with atrial flutter RVR, cardiology consulted.  Now transitioned off of amiodarone drip as well as heparin to oral medications. Assessment & Plan   Acute hypoxic respiratory failure secondary to COPD exacerbation as well as atrial fibrillation, CHF -Continues to have some wheezing on examination -Will transition from IV Solu-Medrol to oral prednisone -Continue azithromycin  -Continue to treat underlying conditions  Acute systolic/diastolic heart failure -Echocardiogram: EF of 30 to 35%, global hypokinesis, LVH, LV diastolic parameters indeterminate. -Cardiology consulted and appreciated-recommended transitioning metoprolol to metoprolol succinate 75 mg twice daily -Discussed with cardiology today, will add on low-dose losartan -Patient has had some acute kidney injury as well as soft blood pressures this admission-continue to  monitor these for the next 24 hours -Cardiology also recommending Lasix 20 mg daily upon hospital discharge -Currently has a net even fluid balance.  Atrial flutter with RVR -Patient was placed on amiodarone drip as her rate was difficult to control with calcium channel or beta blockers -Has now been transitioned to oral-amiodarone 400 mg twice daily, metoprolol 75 mg twice daily -Patient may need cardioversion within a few weeks versus a different strategy -Was on heparin drip and transitioned to Xarelto  Acute kidney injury on chronic kidney disease, stage IIIa -On admission, creatinine was 1.6, peaked to 1.9 (baseline of 1.1-1.2) -Lasix, lisinopril held -Creatinine down to 1.4 -Continue to monitor BMP  Diabetes mellitus, type II -metformin held -Recent A1c 6.6  -Continue Lantus, insulin sliding scale and CBG monitoring  Obstructive sleep apnea -Continue CPAP  Essential hypertension -Continue Cardizem, metoprolol -Lisinopril was held due to AKI however now that that has improved, will start patient on low-dose losartan  Hyperlipidemia -Continue statin  Dementia -Continue Aricept, Namenda  Obesity -BMI of 36 -Patient to follow-up with PCP for lifestyle modification  Deconditioning -PT recommending SNF -TOC made aware  DVT Prophylaxis  Xarelto  Code Status: Full  Family Communication: None at bedside  Disposition Plan:  Status is: Inpatient  Remains inpatient appropriate because:Inpatient level of care appropriate due to severity of illness- being treated for AFlutter, CHF-medications have been adjusted, will continue to monitor for another 24 hours, or when cardiology signs off.   Dispo: The patient is from: ALF              Anticipated d/c is to: SNF              Anticipated d/c date is: 2 days  Patient currently is not medically stable to d/c.   Consultants Cardiology  Procedures  Echocardiogram  Antibiotics   Anti-infectives (From  admission, onward)   Start     Dose/Rate Route Frequency Ordered Stop   07/04/19 1000  azithromycin (ZITHROMAX) tablet 250 mg       "Followed by" Linked Group Details   250 mg Oral Daily 07/03/19 1837 07/07/19 0917   07/03/19 1845  azithromycin (ZITHROMAX) tablet 500 mg       "Followed by" Linked Group Details   500 mg Oral Daily 07/03/19 1837 07/03/19 1853      Subjective:   Cheryl [name] seen and examined today.  Patient with dementia and continuously repeats herself.  No complaints this morning.  Objective:   Vitals:   07/07/19 0448 07/07/19 0758 07/07/19 0800 07/07/19 1130  BP: (!) 91/48 121/81 121/81 119/66  Pulse: 62 82 86 100  Resp:  16  18  Temp: 97.9 F (36.6 C) 97.8 F (36.6 C)  97.8 F (36.6 C)  TempSrc:  Axillary  Oral  SpO2: 98% 99% 98% 97%  Weight: 92.9 kg     Height:        Intake/Output Summary (Last 24 hours) at 07/07/2019 1334 Last data filed at 07/07/2019 0950 Gross per 24 hour  Intake 600 ml  Output 1300 ml  Net -700 ml   Filed Weights   07/05/19 0500 07/06/19 0419 07/07/19 0448  Weight: 93.9 kg 95.7 kg 92.9 kg   Exam  General: Well developed, well nourished, NAD, appears stated age  42: NCAT, mucous membranes moist.   Cardiovascular: S1 S2 auscultated, irregular  Respiratory: Diminished breath sounds however clear  Abdomen: Soft, nontender, nondistended, + bowel sounds  Extremities: warm dry without cyanosis clubbing or edema  Neuro: AAOx 1 (self only) dementia, follows some commands  Psych: Appropriate  Data Reviewed: I have personally reviewed following labs and imaging studies  CBC: Recent Labs  Lab 07/03/19 0947 07/04/19 0659 07/05/19 0207 07/06/19 0027 07/07/19 0657  WBC 7.1 6.5 8.0 9.8 8.4  HGB 10.5* 9.9* 9.7* 9.6* 11.3*  HCT 35.6* 32.7* 32.3* 30.4* 37.2  MCV 91.5 88.6 88.5 85.4 88.4  PLT 357 378 351 350 503   Basic Metabolic Panel: Recent Labs  Lab 07/03/19 0947 07/04/19 0659 07/05/19 0207 07/06/19 0027  07/07/19 0657  NA 145 142 143 141 140  K 3.7 4.1 4.1 3.9 4.3  CL 111 108 107 107 103  CO2 24 27 26 25 27   GLUCOSE 229* 172* 265* 208* 172*  BUN 33* 38* 52* 64* 53*  CREATININE 1.61* 1.50* 1.93* 1.81* 1.44*  CALCIUM 8.8* 8.7* 8.6* 8.5* 8.8*   GFR: Estimated Creatinine Clearance: 36.2 mL/min (A) (by C-G formula based on SCr of 1.44 mg/dL (H)). Liver Function Tests: No results for input(s): AST, ALT, ALKPHOS, BILITOT, PROT, ALBUMIN in the last 168 hours. No results for input(s): LIPASE, AMYLASE in the last 168 hours. No results for input(s): AMMONIA in the last 168 hours. Coagulation Profile: No results for input(s): INR, PROTIME in the last 168 hours. Cardiac Enzymes: No results for input(s): CKTOTAL, CKMB, CKMBINDEX, TROPONINI in the last 168 hours. BNP (last 3 results) No results for input(s): PROBNP in the last 8760 hours. HbA1C: No results for input(s): HGBA1C in the last 72 hours. CBG: Recent Labs  Lab 07/06/19 1545 07/06/19 2109 07/06/19 2353 07/07/19 0719 07/07/19 1133  GLUCAP 208* 144* 227* 146* 298*   Lipid Profile: No results for input(s): CHOL, HDL, LDLCALC, TRIG,  CHOLHDL, LDLDIRECT in the last 72 hours. Thyroid Function Tests: Recent Labs    07/05/19 1252  TSH 0.307*   Anemia Panel: No results for input(s): VITAMINB12, FOLATE, FERRITIN, TIBC, IRON, RETICCTPCT in the last 72 hours. Urine analysis:    Component Value Date/Time   COLORURINE YELLOW (A) 05/09/2019 2335   APPEARANCEUR CLEAR (A) 05/09/2019 2335   APPEARANCEUR Hazy 07/26/2013 1013   LABSPEC 1.038 (H) 05/09/2019 2335   LABSPEC 1.029 07/26/2013 1013   PHURINE 5.0 05/09/2019 2335   GLUCOSEU 50 (A) 05/09/2019 2335   GLUCOSEU Negative 07/26/2013 1013   HGBUR NEGATIVE 05/09/2019 2335   BILIRUBINUR NEGATIVE 05/09/2019 2335   BILIRUBINUR Negative 07/26/2013 1013   KETONESUR 5 (A) 05/09/2019 2335   PROTEINUR 30 (A) 05/09/2019 2335   NITRITE NEGATIVE 05/09/2019 2335   LEUKOCYTESUR NEGATIVE  05/09/2019 2335   LEUKOCYTESUR Negative 07/26/2013 1013   Sepsis Labs: @LABRCNTIP (procalcitonin:4,lacticidven:4)  ) Recent Results (from the past 240 hour(s))  SARS Coronavirus 2 by RT PCR (hospital order, performed in [place] hospital lab) Nasopharyngeal Nasopharyngeal Swab     Status: None   Collection Time: 07/03/19  2:19 PM   Specimen: Nasopharyngeal Swab  Result Value Ref Range Status   SARS Coronavirus 2 NEGATIVE NEGATIVE Final    Comment: (NOTE) SARS-CoV-2 target nucleic acids are NOT DETECTED.  The SARS-CoV-2 RNA is generally detectable in upper and lower respiratory specimens during the acute phase of infection. The lowest concentration of SARS-CoV-2 viral copies this assay can detect is 250 copies / mL. A negative result does not preclude SARS-CoV-2 infection and should not be used as the sole basis for treatment or other patient management decisions.  A negative result may occur with improper specimen collection / handling, submission of specimen other than nasopharyngeal swab, presence of viral mutation(s) within the areas targeted by this assay, and inadequate number of viral copies (<250 copies / mL). A negative result must be combined with clinical observations, patient history, and epidemiological information.  Fact Sheet for Patients:   StrictlyIdeas.no  Fact Sheet for Healthcare Providers: BankingDealers.co.za  This test is not yet approved or  cleared by the [place] FDA and has been authorized for detection and/or diagnosis of SARS-CoV-2 by FDA under an Emergency Use Authorization (EUA).  This EUA will remain in effect (meaning this test can be used) for the duration of the COVID-19 declaration under Section 564(b)(1) of the Act, 21 U.S.C. section 360bbb-3(b)(1), unless the authorization is terminated or revoked sooner.  Performed at [facility], [address] [place], [place]  [address]   MRSA PCR Screening     Status: None   Collection Time: 07/04/19 12:20 PM   Specimen: Nasopharyngeal  Result Value Ref Range Status   MRSA by PCR NEGATIVE NEGATIVE Final    Comment:        The GeneXpert MRSA Assay (FDA approved for NASAL specimens only), is one component of a comprehensive MRSA colonization surveillance program. It is not intended to diagnose MRSA infection nor to guide or monitor treatment for MRSA infections. Performed at [facility], [address], [place], [place] [address]       Radiology Studies: No results found.   Scheduled Meds:  ALPRAZolam  0.25 mg Oral BID   amiodarone  400 mg Oral BID   atorvastatin  20 mg Oral QHS   dextromethorphan-guaiFENesin  1 tablet Oral BID   memantine  28 mg Oral Daily   And   donepezil  10 mg Oral  QHS   ferrous sulfate  325 mg Oral BID WC   fluticasone  2 spray Each Nare Daily   insulin aspart  0-9 Units Subcutaneous Q4H   insulin glargine  7 Units Subcutaneous Daily   levalbuterol  0.63 mg Nebulization Q8H   losartan  25 mg Oral Daily   metoprolol succinate  75 mg Oral BID   modafinil  100 mg Oral Daily   mometasone-formoterol  2 puff Inhalation BID   pantoprazole  40 mg Oral Daily   perphenazine  4 mg Oral TID   QUEtiapine  200 mg Oral QHS   rivaroxaban  15 mg Oral Q supper   Continuous Infusions:   LOS: 4 days   Time Spent in minutes   30 minutes  Tanai Bouler D.O. on 07/07/2019 at 1:34 PM  Between 7am to 7pm - Please see pager noted on [email or web address]  After 7pm go to [email or web address]  And look for the night coverage person covering for me after hours  [facility] Office  [phone]

## 2019-07-07 NOTE — Evaluation (Signed)
Occupational Therapy Evaluation Patient Details Name: Cheryl [name] MRN: [id number] DOB: 11-11-1940 Today's Date: 07/07/2019    History of Present Illness presented to ER secondary to progressive SOB; admitted for management of acute/chronic respiratory failure with hypoxia due to COPD exacerbation.  Hospital course also significant for aflutter with RVR, elevated troponin (demand), managed with amioderone and heparin throughout stay.   Clinical Impression   Cheryl [name] presents in the midst of sitting with Cheryl [name] on arrival, using RW and +2 physical assist. She is agreeable to evaluation and pleasant throughout. Cheryl [name] is generally a poor historian, most information gathered from chart review. She follows 1-2 step commands consistently and responds well to verbal/visual cues. She demonstrates intermittent echolalia and poor immediate and short term recall of information. Cheryl [name] vitals monitored and remained consistent throughout. She washed her face while seated with Setup A and visual/verbal cues for initiation and required Min A for doffing/donning clean gown. Per Cheryl [name] inability to don/doff socks without assist, anticipate Max A for LB ADL. Cheryl [name] will benefit from skilled acute OT services while in house to address deficits (see "OT Problem List") in order to maximize Cheryl [name] independence and minimize safety risks. Recommend SNF upon discharge to maximize Cheryl [name] outcomes.     Follow Up Recommendations  SNF    Equipment Recommendations  Other (comment) (TBD to next venue)    Recommendations for Other Services       Precautions / Restrictions Precautions Precautions: Fall Restrictions Weight Bearing Restrictions: No      Mobility Bed Mobility Overal bed mobility: Needs Assistance Bed Mobility: Supine to Sit     Supine to sit: Mod assist     General bed mobility comments: Cheryl [name] up in recliner on arrival/departure  Transfers Overall transfer level: Needs assistance Equipment used: Rolling walker (2  wheeled) Transfers: Sit to/from Stand Sit to Stand: Min assist;Mod assist;+2 physical assistance         General transfer comment: Cheryl [name] up in recliner on arrival/departure; observed Cheryl [name] completing stand to sit with Cheryl [name] on arrival using RW with +2 assist    Balance Overall balance assessment: Needs assistance Sitting-balance support: No upper extremity supported;Feet supported Sitting balance-Leahy Scale: Good Sitting balance - Comments: steady static sitting balance during grooming/hygiene   Standing balance support: Bilateral upper extremity supported Standing balance-Leahy Scale: Poor                             ADL either performed or assessed with clinical judgement   ADL Overall ADL's : Needs assistance/impaired     Grooming: Wash/dry face;Cueing for sequencing;Sitting;Set up Grooming Details (indicate cue type and reason): verbal and visual cues to initiate, Cheryl [name] completes task without physical assist         Upper Body Dressing : Minimal assistance;Cueing for sequencing;Sitting Upper Body Dressing Details (indicate cue type and reason): verbal and visual cues provided to doff/don gown Lower Body Dressing: Maximal assistance;Sitting/lateral leans Lower Body Dressing Details (indicate cue type and reason): Cheryl [name] able to reach socks, but unable to doff/don without assist               General ADL Comments: Cheryl [name] requires visual and verbal cues throughout, responds well; functional mobility deferred during ADL this session     Vision Baseline Vision/History: No visual deficits (per Cheryl [name])       Perception     Praxis      Pertinent Vitals/Pain Pain Assessment: No/denies pain  Hand Dominance Right   Extremity/Trunk Assessment Upper Extremity Assessment Upper Extremity Assessment: Overall WFL for tasks assessed;Difficult to assess due to impaired cognition (~120* ROM in BUE; difficulties following commands for MMT)   Lower Extremity Assessment Lower  Extremity Assessment: Defer to Cheryl [name] evaluation       Communication Communication Communication: Expressive difficulties (answers questions, makes needs known; someone echolalic at times)   Cognition Arousal/Alertness: Awake/alert Behavior During Therapy: WFL for tasks assessed/performed Overall Cognitive Status: History of cognitive impairments - at baseline                                 General Comments: Cheryl [name] with advanced dementia; oriented to self only; follows 1-2 step commands consistently; generally poor historian; demonstrates difficulties with immediate and short term recall   General Comments       Exercises Other Exercises Other Exercises: Rolling bilat, min assist; hand-over-hand to initiate and guide movement; dep assist for hygiene after incontinent bowel episode (patient unaware) Other Exercises: Provided verbal and visual cues to facilitate Cheryl [name] participation in seated ADL   Shoulder Instructions      Home Living Family/patient expects to be discharged to:: Assisted living                             Home Equipment: Gilford Rile - 2 wheels   Additional Comments: Per chart, resident of [facility]      Prior Functioning/Environment          Comments: Patient poor historian, unable to provide accurate information; per chart, ambulatory with RW        OT Problem List: Decreased knowledge of use of DME or AE;Decreased cognition;Decreased safety awareness;Decreased strength;Impaired balance (sitting and/or standing);Cardiopulmonary status limiting activity      OT Treatment/Interventions: Self-care/ADL training;Therapeutic exercise;Therapeutic activities;Energy conservation;DME and/or AE instruction;Patient/family education;Balance training    OT Goals(Current goals can be found in the care plan section) Acute Rehab OT Goals Patient Stated Goal: To get cleaned up today OT Goal Formulation: With patient Time For Goal Achievement:  07/21/19 Potential to Achieve Goals: Good  OT Frequency: Min 1X/week   Barriers to D/C:            Co-evaluation              AM-PAC OT "6 Clicks" Daily Activity     Outcome Measure Help from another person eating meals?: A Little Help from another person taking care of personal grooming?: A Little Help from another person toileting, which includes using toliet, bedpan, or urinal?: A Lot Help from another person bathing (including washing, rinsing, drying)?: A Lot Help from another person to put on and taking off regular upper body clothing?: A Little Help from another person to put on and taking off regular lower body clothing?: A Lot 6 Click Score: 15   End of Session Nurse Communication: Mobility status  Activity Tolerance: Patient tolerated treatment well Patient left: in chair;with call bell/phone within reach;with chair alarm set  OT Visit Diagnosis: Other abnormalities of gait and mobility (R26.89);Muscle weakness (generalized) (M62.81);Other symptoms and signs involving cognitive function                Time: 1030-1058 OT Time Calculation (min): 28 min Charges:  OT General Charges $OT Visit: 1 Visit OT Evaluation $OT Eval Moderate Complexity: 1 Mod OT Treatments $Self Care/Home Management : 23-37 mins  Watt Climes  [place], [place] 07/07/19, 2:10 PM

## 2019-07-07 NOTE — Progress Notes (Signed)
Inpatient Diabetes Program Recommendations  AACE/ADA: New Consensus Statement on Inpatient Glycemic Control (2015)  Target Ranges:  Prepandial:   less than 140 mg/dL      Peak postprandial:   less than 180 mg/dL (1-2 hours)      Critically ill patients:  140 - 180 mg/dL   Lab Results  Component Value Date   GLUCAP 298 (H) 07/07/2019   HGBA1C 6.7 (H) 07/04/2019    Review of Glycemic Control Results for Cheryl [name], Cheryl [name] (MRN [id number]) as of 07/07/2019 14:59  Ref. Range 07/06/2019 15:45 07/06/2019 21:09 07/06/2019 23:53 07/07/2019 07:19 07/07/2019 11:33  Glucose-Capillary Latest Ref Range: 70 - 99 mg/dL 208 (H) 144 (H) 227 (H) 146 (H) 298 (H)   Diabetes history: DM 2 Outpatient Diabetes medications:  Lantus 10 units daily, Metformin 500 mg bid Current orders for Inpatient glycemic control:  Lantus 7 units daily Novolog sensitive q 4 hours Prednisone 50 mg daily Inpatient Diabetes Program Recommendations:    Please consider increasing Lantus to 10 units daily.   Thanks  Adah Perl, RN, BC-ADM Inpatient Diabetes Coordinator Pager [phone] (8a-5p)

## 2019-07-07 NOTE — Evaluation (Signed)
Physical Therapy Evaluation Patient Details Name: Quin Mcpherson MRN: [id number] DOB: April 02, 1940 Today's Date: 07/07/2019   History of Present Illness  presented to ER secondary to progressive SOB; admitted for management of acute/chronic respiratory failure with hypoxia due to COPD exacerbation.  Hospital course also significant for aflutter with RVR, elevated troponin (demand), managed with amioderone and heparin throughout stay.  Clinical Impression  Upon evaluation, patient alert and oriented to self only; able to answer simple questions and voice wants/needs.  Speech somewhat echolalic in responses at times.  Follows simple commands, but does require increased time for processing, intermittent hand-over-hand for task initiation and completion.  Bilat UE/LE strength and ROM grossly symmetrical and WFL; no focal weakness appreciated.  Currently requiring mod assist +1-2 for bed mobility; min/mod assist +2 with RW for sit/stand and standing balance; mod assist +2 with RW for basic transfers and short-distance gait (5').  Demonstrates short, choppy steps; significant buckling (L > R) with standing/stepping attempts.  Poor balance, generally impulsive with limited attempts to self-correct buckling or balance losses.  Unsafe to attempt without +2 at all times Significant SOB/dyspnea noted with exertion; however, sats remain >92% on RA throughout session. Would benefit from skilled PT to address above deficits and promote optimal return to PLOF.;d recommend transition to STR upon discharge from acute hospitalization.     Follow Up Recommendations SNF    Equipment Recommendations       Recommendations for Other Services       Precautions / Restrictions Precautions Precautions: Fall Restrictions Weight Bearing Restrictions: No      Mobility  Bed Mobility Overal bed mobility: Needs Assistance Bed Mobility: Supine to Sit     Supine to sit: Mod assist        Transfers Overall  transfer level: Needs assistance Equipment used: Rolling walker (2 wheeled) Transfers: Sit to/from Stand Sit to Stand: Min assist;Mod assist;+2 physical assistance         General transfer comment: cuing for hand placement, assist for lift off, postural extnesion and standing balance  Ambulation/Gait Ambulation/Gait assistance: Mod assist;+2 physical assistance Gait Distance (Feet): 5 Feet Assistive device: Rolling walker (2 wheeled)       General Gait Details: short, choppy steps; significant buckling (L > R) with standing/stepping attempts.  Poor balance, generally impulsive with limited attempts to self-correct buckling or balance losses.  Unsafe to attempt without +2 at all times  Stairs            Wheelchair Mobility    Modified Rankin (Stroke Patients Only)       Balance Overall balance assessment: Needs assistance Sitting-balance support: No upper extremity supported;Feet supported Sitting balance-Leahy Scale: Good     Standing balance support: Bilateral upper extremity supported Standing balance-Leahy Scale: Poor                               Pertinent Vitals/Pain Pain Assessment: No/denies pain    Home Living Family/patient expects to be discharged to:: Assisted living               Home Equipment: Gilford Rile - 2 wheels Additional Comments: Per chart, resident of [facility]    Prior Function           Comments: Patient poor historian, unable to provide accurate information; per chart, ambulatory with RW     Hand Dominance   Dominant Hand: Right    Extremity/Trunk Assessment   Upper Extremity Assessment  Upper Extremity Assessment: Overall WFL for tasks assessed (grossly at least 4-/5 throughout)    Lower Extremity Assessment Lower Extremity Assessment: Overall WFL for tasks assessed (grossly at least 4/5 throughout)       Communication   Communication: Expressive difficulties (answers questions, makes needs known;  somewhat echolalic in response at times)  Cognition Arousal/Alertness: Awake/alert Behavior During Therapy: WFL for tasks assessed/performed Overall Cognitive Status: Within Functional Limits for tasks assessed                                        General Comments      Exercises Other Exercises Other Exercises: Rolling bilat, min assist; hand-over-hand to initiate and guide movement; dep assist for hygiene after incontinent bowel episode (patient unaware)   Assessment/Plan    PT Assessment Patient needs continued PT services  PT Problem List Decreased strength;Decreased range of motion;Decreased activity tolerance;Decreased balance;Decreased mobility;Decreased knowledge of use of DME;Decreased safety awareness;Decreased knowledge of precautions;Decreased coordination;Decreased cognition;Cardiopulmonary status limiting activity;Obesity       PT Treatment Interventions DME instruction;Gait training;Functional mobility training;Therapeutic activities;Therapeutic exercise;Balance training;Neuromuscular re-education;Cognitive remediation;Patient/family education    PT Goals (Current goals can be found in the Care Plan section)  Acute Rehab PT Goals Patient Stated Goal: agreeable to OOB to chair PT Goal Formulation: With patient Time For Goal Achievement: 07/21/19 Potential to Achieve Goals: Fair    Frequency Min 2X/week   Barriers to discharge Decreased caregiver support      Co-evaluation               AM-PAC PT "6 Clicks" Mobility  Outcome Measure Help needed turning from your back to your side while in a flat bed without using bedrails?: A Little Help needed moving from lying on your back to sitting on the side of a flat bed without using bedrails?: A Lot Help needed moving to and from a bed to a chair (including a wheelchair)?: A Lot Help needed standing up from a chair using your arms (e.g., wheelchair or bedside chair)?: A Lot Help needed to walk  in hospital room?: Total Help needed climbing 3-5 steps with a railing? : Total 6 Click Score: 11    End of Session Equipment Utilized During Treatment: Gait belt Activity Tolerance: Patient tolerated treatment well Patient left: in chair;with call bell/phone within reach;with chair alarm set Nurse Communication: Mobility status PT Visit Diagnosis: Muscle weakness (generalized) (M62.81);Difficulty in walking, not elsewhere classified (R26.2)    Time: 7793-9030 PT Time Calculation (min) (ACUTE ONLY): 19 min   Charges:   PT Evaluation $PT Eval Moderate Complexity: 1 Mod PT Treatments $Therapeutic Activity: 8-22 mins        Adelei Scobey H. Owens Shark, PT, DPT, NCS 07/07/19, 10:42 AM [phone]

## 2019-07-07 NOTE — TOC Initial Note (Signed)
Transition of Care [facility]) - Initial/Assessment Note    Patient Details  Name: Alleya Demeter MRN: [id number] Date of Birth: 15-Sep-1940  Transition of Care [facility]) CM/SW Contact:    Eileen Stanford, LCSW Phone Number: 07/07/2019, 3:42 PM  Clinical Narrative:    CSW spoke with Etter Sjogren, Care Coordinator at [facility]. Tammy has previously reported that no family is involved in pt's care. Pt has been at [facility] for three years. Tammy ask that CSW reach out to [facility] (1), [facility], or [facility] to see if they can accept pt. CSW will send referral.               Expected Discharge Plan: Skilled Nursing Facility Barriers to Discharge: Continued Medical Work up   Patient Goals and CMS Choice Patient states their goals for this hospitalization and ongoing recovery are:: for pt to get rehab   Choice offered to / list presented to : Adult Children (facility staff)  Expected Discharge Plan and Services Expected Discharge Plan: [place] Choice: [place] arrangements for the past 2 months: [place]                                      Prior Living Arrangements/Services Living arrangements for the past 2 months: [place] Lives with:: Self Patient language and need for interpreter reviewed:: Yes Do you feel safe going back to the place where you live?: Yes      Need for Family Participation in Patient Care: Yes (Comment) Care giver support system in place?: Yes (comment)   Criminal Activity/Legal Involvement Pertinent to Current Situation/Hospitalization: No - Comment as needed  Activities of Daily Living Home Assistive Devices/Equipment: Other (Comment) (from SNF) ADL Screening (condition at time of admission) Patient's cognitive ability adequate to safely complete daily activities?: No Is the patient deaf or have difficulty hearing?: No Does the patient have difficulty seeing,  even when wearing glasses/contacts?: No Does the patient have difficulty concentrating, remembering, or making decisions?: Yes Patient able to express need for assistance with ADLs?: Yes Does the patient have difficulty dressing or bathing?: Yes Independently performs ADLs?: No Is this a change from baseline?: Pre-admission baseline Does the patient have difficulty walking or climbing stairs?: Yes Weakness of Legs: Both Weakness of Arms/Hands: Both  Permission Sought/Granted Permission sought to share information with : Other (comment)    Share Information with NAME: Tammy Caprice Beaver  Permission granted to share info w AGENCY: [place] granted to share info w Relationship: Care Coordinator at [employer]     Emotional Assessment Appearance:: Appears stated age Attitude/Demeanor/Rapport: Unable to Assess Affect (typically observed): Unable to Assess Orientation: : Oriented to Self Alcohol / Substance Use: Not Applicable Psych Involvement: No (comment)  Admission diagnosis:  Hypoxia [R09.02] COPD exacerbation (HCC) [J44.1] Dyspnea, unspecified type [R06.00] Patient Active Problem List   Diagnosis Date Noted  . Dementia ([place])   . Type II diabetes mellitus with renal manifestations ([place])   . Elevated troponin   . Acute on chronic respiratory failure with hypoxia ([place])   . Acute renal failure superimposed on stage 3a chronic kidney disease ([place])   . Acute respiratory failure with hypoxia ([place])   . Shortness of breath   . COPD with acute exacerbation ([place]) 05/09/2019  . Respiratory failure with hypoxia and hypercapnia ([place]) 05/09/2019  .  Hyperglycemia due to diabetes mellitus ([place]) 05/09/2019  . GERD (gastroesophageal reflux disease) 05/09/2019  . HTN (hypertension) 05/09/2019  . HLD (hyperlipidemia) 05/09/2019  . Moderate dementia, with behavioral disturbance ([place]) 10/06/2016  . Incontinence of feces 05/30/2016  . COPD exacerbation ([place]) 01/08/2016  . Hypokalemia  02/07/2015  . Cancer of left lung ([place]) 07/25/2011   PCP:  Letta Median, MD Pharmacy:   [facility], [place] [facility] [place] [address] [place] [address] Phone: [phone] Fax: [phone]     Social Determinants of Health (SDOH) Interventions    Readmission Risk Interventions Readmission Risk Prevention Plan 05/10/2019  Transportation Screening Complete  PCP or Specialist Appt within 3-5 Days Complete  HRI or Home Care Consult Complete  Medication Review (RN Care Manager) Complete  Some recent data might be hidden

## 2019-07-07 NOTE — Progress Notes (Signed)
Progress Note  Patient Name: Cheryl [name] Date of Encounter: 07/07/2019  Primary Cardiologist: Kathlyn Sacramento, MD  Subjective   Denies c/p, dyspnea, palpitations.  Sitting up eating breakfast.  Inpatient Medications    Scheduled Meds: . ALPRAZolam  0.25 mg Oral BID  . amiodarone  400 mg Oral BID  . atorvastatin  20 mg Oral QHS  . azithromycin  250 mg Oral Daily  . dextromethorphan-guaiFENesin  1 tablet Oral BID  . memantine  28 mg Oral Daily   And  . donepezil  10 mg Oral QHS  . ferrous sulfate  325 mg Oral BID WC  . fluticasone  2 spray Each Nare Daily  . insulin aspart  0-9 Units Subcutaneous Q4H  . insulin glargine  7 Units Subcutaneous Daily  . levalbuterol  0.63 mg Nebulization Q8H  . metoprolol succinate  75 mg Oral BID  . modafinil  100 mg Oral Daily  . mometasone-formoterol  2 puff Inhalation BID  . pantoprazole  40 mg Oral Daily  . perphenazine  4 mg Oral TID  . predniSONE  50 mg Oral Q breakfast  . QUEtiapine  200 mg Oral QHS  . rivaroxaban  15 mg Oral Q supper   Continuous Infusions:  PRN Meds: acetaminophen, albuterol, alum & mag hydroxide-simeth, hydrALAZINE, magnesium hydroxide, metoprolol tartrate, ondansetron (ZOFRAN) IV   Vital Signs    Vitals:   07/07/19 0050 07/07/19 0448 07/07/19 0758 07/07/19 0800  BP:  (!) 91/48 121/81 121/81  Pulse: (!) 102 62 82 86  Resp:   16   Temp:  97.9 F (36.6 C) 97.8 F (36.6 C)   TempSrc:   Axillary   SpO2: 100% 98% 99% 98%  Weight:  92.9 kg    Height:        Intake/Output Summary (Last 24 hours) at 07/07/2019 0852 Last data filed at 07/07/2019 0847 Gross per 24 hour  Intake 1140.17 ml  Output 1750 ml  Net -609.83 ml   Filed Weights   07/05/19 0500 07/06/19 0419 07/07/19 0448  Weight: 93.9 kg 95.7 kg 92.9 kg    Physical Exam   GEN: Obese, in no acute distress.  HEENT: Grossly normal.  Neck: Supple, obese, difficult to gauge jvp. No carotid bruits, or masses. Cardiac: IR, IR, no murmurs, rubs,  or gallops. No clubbing, cyanosis.  Trace bilat ankle edema.  Radials/DP/PT 1+ and equal bilaterally.  Respiratory:  Respirations regular and unlabored, occas exp wheeze noted bilat. GI: Obese, soft, nontender, nondistended, BS + x 4. MS: no deformity or atrophy. Skin: warm and dry, no rash. Cool extremities. Neuro:  Strength and sensation are intact. Psych: Speech is relatively unintelligible. Difficult to ascertain orientation.  Pleasant, follows command.  Labs    Chemistry Recent Labs  Lab 07/05/19 0207 07/06/19 0027 07/07/19 0657  NA 143 141 140  K 4.1 3.9 4.3  CL 107 107 103  CO2 26 25 27   GLUCOSE 265* 208* 172*  BUN 52* 64* 53*  CREATININE 1.93* 1.81* 1.44*  CALCIUM 8.6* 8.5* 8.8*  GFRNONAA 25* 27* 35*  GFRAA 28* 31* 40*  ANIONGAP 10 9 10      Hematology Recent Labs  Lab 07/05/19 0207 07/06/19 0027 07/07/19 0657  WBC 8.0 9.8 8.4  RBC 3.65* 3.56* 4.21  HGB 9.7* 9.6* 11.3*  HCT 32.3* 30.4* 37.2  MCV 88.5 85.4 88.4  MCH 26.6 27.0 26.8  MCHC 30.0 31.6 30.4  RDW 16.7* 16.6* 16.6*  PLT 351 350 400    Cardiac  Enzymes  Recent Labs  Lab 07/03/19 0947 07/03/19 1147 07/03/19 1526 07/03/19 1918  TROPONINIHS 50* 53* 42* 43*      BNP Recent Labs  Lab 07/03/19 0947  BNP 1,303.1*     Radiology    DG Chest 2 View  Result Date: 07/03/2019 CLINICAL DATA:  Shortness of breath and hypoxia. History of lung cancer post radiation. EXAM: CHEST - 2 VIEW COMPARISON:  05/09/2019 and CT 05/09/2019 FINDINGS: Patient slightly rotated to the left. Lungs are adequately inflated without focal airspace consolidation, effusion or pneumothorax. Known left perihilar scarring in this patient with history of lung cancer post radiation. Mild stable cardiomegaly. Remainder of the exam is unchanged. Minimal linear scarring over the left upper lung. IMPRESSION: 1.  No acute findings. 2. Mild stable cardiomegaly. Stable scarring over the left hilar region in this patient with history of  lung cancer post radiation. Electronically Signed   By: Marin Olp M.D.   On: 07/03/2019 11:01   CT Angio Chest PE W and/or Wo Contrast  Result Date: 07/03/2019 CLINICAL DATA:  Shortness of breath.  Wheezing.  Tachycardia. EXAM: CT ANGIOGRAPHY CHEST WITH CONTRAST TECHNIQUE: Multidetector CT imaging of the chest was performed using the standard protocol during bolus administration of intravenous contrast. Multiplanar CT image reconstructions and MIPs were obtained to evaluate the vascular anatomy. CONTRAST:  59mL OMNIPAQUE IOHEXOL 350 MG/ML SOLN COMPARISON:  CT scan dated 05/09/2019 FINDINGS: Cardiovascular: Satisfactory opacification of the pulmonary arteries to the segmental level. No evidence of pulmonary embolism. Borderline cardiomegaly. Trace pericardial effusion. Aortic atherosclerosis. Scattered coronary artery calcifications. Mediastinum/Nodes: There is bronchomalacia of the mainstem bronchi bilaterally. Trachea appears normal. Esophagus is normal. Large chronic hiatal hernia. Lungs/Pleura: Stable parenchymal scarring anteromedially in the right hemithorax and in the left perihilar region and at the left lung base, unchanged. Tiny new right effusion. Upper Abdomen: No acute abnormality. Stable 17 mm low-density lesion in the right lobe of the liver consistent with a cyst. Musculoskeletal: No chest wall abnormality. No acute or significant osseous findings. Review of the MIP images confirms the above findings. IMPRESSION: 1. No pulmonary emboli. 2. Bronchomalacia of mainstem bronchi bilaterally. Both mainstem bronchi are markedly narrowed. 3. Tiny new right effusion. 4. Stable bilateral parenchymal scarring. 5. Large chronic hiatal hernia. 6.  Aortic Atherosclerosis (ICD10-I70.0). Aortic Atherosclerosis (ICD10-I70.0). Electronically Signed   By: Lorriane Shire M.D.   On: 07/03/2019 13:05   Telemetry    Atrial flutter, PVCs, 80's to low 100's - Personally Reviewed  Cardiac Studies   2D  Echocardiogram 6.2021  . Left ventricular ejection fraction, by estimation, is 30 to 35%. The  left ventricle has moderately decreased function. The left ventricle  demonstrates global hypokinesis. There is mild left ventricular  hypertrophy. Left ventricular diastolic  parameters are indeterminate.  2. Right ventricular systolic function is mildly reduced. The right  ventricular size is mildly enlarged. There is mildly elevated pulmonary  artery systolic pressure.  3. Left atrial size was mildly dilated.  4. Right atrial size was moderately dilated.  5. The mitral valve is normal in structure. Trivial mitral valve  regurgitation. No evidence of mitral stenosis.  6. Tricuspid valve regurgitation is moderate.  7. The aortic valve is normal in structure. Aortic valve regurgitation is  trivial. Mild aortic valve sclerosis is present, with no evidence of  aortic valve stenosis.  8. Moderately dilated pulmonary artery.  _____________  Patient Profile     79 y.o. femalewith history of DM2, HTN, HLD, cognitive impairment/dementia, COPD,  CKD stage III, lung cancer s/p radiation therapy, iron deficiency anemia, depression, anxiety, and GERDwho is being seen today for the evaluation of acute systolic CHF and atrial flutter with RVR.  Assessment & Plan    1.  Acute HFrEF:  Denies dyspnea.  Only trace lower ext edema on exam.  Has been off of diuretic rx since 6/21.  Minus 849 ml yesterday and 1.4L since admission (though intake does not appear to always be accurately recorded).  Wt down to 92.9kg on bedscale this AM.  EF 30-35% by echo in setting of atrial flutter.  Cont  blocker and as renal fxn has stabilized and bp allows, will add low-dose arb (prev on acei @ home).  F/u bmet in am.    2.  Atrial Flutter w/ variable block:  Reasonably rate controlled on  blocker and oral amio therapy.  CHA2DS2VASc = 6-->xarelto 15mg  daily.  Would plan to drop amio dose to 200 bid in ~ 1 wk and can  consider either outpt dccv in 4 wks or discontinuation of amio if decision is to pursue rate control strategy.  3.  COPD exacerbation:  Still w/ occas faint exp wheeze.  Steroids, inhalers/nebs, abx per IM.  4.  Elevated HsTroponin:  Mild elevation w/ a flat trend 50  53  42  43, in the setting of above.  EF 30-35% by echo.  No chest pain.  Given comorbidities, most notably advanced dementia, will plan to cont conservative mgmt w/  blocker and statin. No ASA in setting of xarelto.  5.  Acute on chronic stage III kidney dzs:  Creat improving w/ holding of lasix. Only received 20mg  on 6/21.  Was on lisinopril 10mg  daily @ home previously.  With COPD exacerbation will add back a low-dose ARB instead.  6.  DMII:  A1c 6.7.  Per IM.  Signed, Murray Hodgkins, NP  07/07/2019, 8:52 AM    For questions or updates, please contact   Please consult [email or web address] for contact info under Cardiology/STEMI.

## 2019-07-08 ENCOUNTER — Inpatient Hospital Stay: Payer: Self-pay

## 2019-07-08 DIAGNOSIS — I5020 Unspecified systolic (congestive) heart failure: Secondary | ICD-10-CM

## 2019-07-08 DIAGNOSIS — I4819 Other persistent atrial fibrillation: Secondary | ICD-10-CM

## 2019-07-08 DIAGNOSIS — R579 Shock, unspecified: Secondary | ICD-10-CM

## 2019-07-08 LAB — GLUCOSE, CAPILLARY
Glucose-Capillary: 121 mg/dL — ABNORMAL HIGH (ref 70–99)
Glucose-Capillary: 88 mg/dL (ref 70–99)
Glucose-Capillary: 139 mg/dL — ABNORMAL HIGH (ref 70–99)
Glucose-Capillary: 132 mg/dL — ABNORMAL HIGH (ref 70–99)
Glucose-Capillary: 131 mg/dL — ABNORMAL HIGH (ref 70–99)
Glucose-Capillary: 170 mg/dL — ABNORMAL HIGH (ref 70–99)

## 2019-07-08 LAB — BASIC METABOLIC PANEL
Anion gap: 9 (ref 5–15)
BUN: 52 mg/dL — ABNORMAL HIGH (ref 8–23)
CO2: 27 mmol/L (ref 22–32)
Calcium: 8.5 mg/dL — ABNORMAL LOW (ref 8.9–10.3)
Chloride: 102 mmol/L (ref 98–111)
Creatinine, Ser: 1.49 mg/dL — ABNORMAL HIGH (ref 0.44–1.00)
GFR calc Af Amer: 39 mL/min — ABNORMAL LOW (ref >60)
GFR calc non Af Amer: 34 mL/min — ABNORMAL LOW (ref >60)
Glucose, Bld: 116 mg/dL — ABNORMAL HIGH (ref 70–99)
Potassium: 3.8 mmol/L (ref 3.5–5.1)
Sodium: 138 mmol/L (ref 135–145)

## 2019-07-08 LAB — MAGNESIUM: Magnesium: 1.8 mg/dL (ref 1.7–2.4)

## 2019-07-08 LAB — MRSA PCR SCREENING: MRSA by PCR: NEGATIVE

## 2019-07-08 MED ORDER — CHLORHEXIDINE GLUCONATE CLOTH 2 % EX PADS
6.0000 | MEDICATED_PAD | Freq: Every day | CUTANEOUS | Status: DC
  Administered 2019-07-08 – 2019-07-13 (×6): 6 via TOPICAL

## 2019-07-08 MED ORDER — DOPAMINE-DEXTROSE 3.2-5 MG/ML-% IV SOLN
0.0000 ug/kg/min | INTRAVENOUS | Status: DC
  Administered 2019-07-08: 5 ug/kg/min via INTRAVENOUS
  Filled 2019-07-08: qty 250

## 2019-07-08 MED ORDER — MIDODRINE HCL 5 MG PO TABS
10.0000 mg | ORAL_TABLET | Freq: Three times a day (TID) | ORAL | Status: DC
  Administered 2019-07-09 – 2019-07-13 (×15): 10 mg via ORAL
  Filled 2019-07-08 (×15): qty 2

## 2019-07-08 MED ORDER — ATROPINE SULFATE 1 MG/10ML IJ SOSY
0.5000 mg | PREFILLED_SYRINGE | INTRAMUSCULAR | Status: AC
  Administered 2019-07-08: 0.5 mg via INTRAVENOUS
  Filled 2019-07-08: qty 10

## 2019-07-08 MED ORDER — POTASSIUM CHLORIDE CRYS ER 20 MEQ PO TBCR
20.0000 meq | EXTENDED_RELEASE_TABLET | Freq: Once | ORAL | Status: AC
  Administered 2019-07-08: 20 meq via ORAL
  Filled 2019-07-08: qty 1

## 2019-07-08 MED ORDER — SODIUM CHLORIDE 0.9% FLUSH: 10.0000 mL | INTRAVENOUS | Status: DC | PRN

## 2019-07-08 MED ORDER — SODIUM CHLORIDE 0.9% FLUSH
10.0000 mL | Freq: Two times a day (BID) | INTRAVENOUS | Status: DC
  Administered 2019-07-08 – 2019-07-11 (×6): 10 mL
  Administered 2019-07-11 – 2019-07-12 (×2): 20 mL
  Administered 2019-07-12 – 2019-07-14 (×4): 10 mL

## 2019-07-08 MED ORDER — GLUCAGON HCL RDNA (DIAGNOSTIC) 1 MG IJ SOLR
1.0000 mg | Freq: Once | INTRAMUSCULAR | Status: AC | PRN
  Administered 2019-07-08: 1 mg via INTRAVENOUS
  Filled 2019-07-08: qty 1

## 2019-07-08 NOTE — Progress Notes (Addendum)
Tele notified this writer that patient was in junctional rhythm. MD aware

## 2019-07-08 NOTE — Progress Notes (Signed)
Patient continues to stay in Junctional rhythm with HR ranging from 40-50s, MD at bedside, patient is diaphoretic and cool to touch. Atropine pushed via IV. Patient HR post atropine is ranging 60-65s

## 2019-07-08 NOTE — Progress Notes (Signed)
IV team at bedside to place PICC line.

## 2019-07-08 NOTE — Progress Notes (Signed)
Received order for PICC  Patient with low BP and not able to place PICC at this time until blood pressure more stable.  If central line needed more urgency please consider temporary central line.  Will follow up later today.

## 2019-07-08 NOTE — Progress Notes (Signed)
Patient to be transferred to the ICU for Dopamine drip. Awaiting bed assignment. Will continue to monitor.

## 2019-07-08 NOTE — Progress Notes (Signed)
Report given to [place], Therapist, sports. Patient to go to room 15.

## 2019-07-08 NOTE — Progress Notes (Signed)
Pt arrived on unit at this time. Pt is alert but confused. HR in the 40s, BP 79/46. Dopamine gtt was started at 5. Talked to PICC RN and he needed fo BP to be more stable. He will check back later this evening. Pt is on room air.

## 2019-07-08 NOTE — Consult Note (Signed)
Name: Cheryl [name] MRN: [id number] DOB: 25-Aug-1940     CONSULTATION DATE: 07/08/2019  REFERRING MD : Ree Kida  CHIEF COMPLAINT: low BP   HISTORY OF PRESENT ILLNESS:  79 y.o.femalewith medical history significant ofhypertension, hyperlipidemia, diabetes mellitus, COPD, GERD, depression, anxiety, lung cancer (s/p of radiation therapy), dementia, iron deficiency anemia, CKD-3, who presents with shortness of breath.  Patient hasdementia, and alert and oriented to person  Patient has been having shortness of breath for several days, which has been progressively worsening. Patient is not on oxygen at home, but requiring 4 L oxygen with oxygen saturation 100% in ED. Patient has dry cough, but denies chest pain. No nausea,vomiting, diarrhea, abdominal pain. Denies symptoms of UTI. Patient moves all extremities.  Interim history Admitted for resp failure/ COPD Exacerbation.   Found to have elevated troponin with atrial flutter RVR, cardiology consulted.  Now transitioned off of amiodarone drip as well as heparin to oral medications.  She had low BP and low HR, transferred to ICU for dopamine infusion   PAST MEDICAL HISTORY :   has a past medical history of COPD (chronic obstructive pulmonary disease) ([place]), Dementia ([place]), Diabetes mellitus without complication ([place]), GERD (gastroesophageal reflux disease), Hyperlipidemia, Hypertension, Lung cancer ([place]), Mental retardation, and Schizophrenia ([place]).  has a past surgical history that includes Abdominal hysterectomy. Prior to Admission medications   Medication Sig Start Date End Date Taking? Authorizing Provider  acetaminophen (TYLENOL) 325 MG tablet Take 650 mg by mouth every 4 (four) hours as needed for mild pain or fever.    Yes [provider]  ALPRAZolam (XANAX) 0.25 MG tablet Take 1 tablet (0.25 mg total) by mouth 2 (two) times daily. 05/15/19  Yes Swayze, Ava, DO  aluminum-magnesium hydroxide-simethicone (MAALOX)  440-347-42 MG/5ML SUSP Take 30 mLs by mouth daily as needed (indegestion or heartburn).    Yes [provider]  amLODipine (NORVASC) 10 MG tablet Take 10 mg by mouth daily.    Yes [provider]  aspirin 325 MG tablet Take 325 mg by mouth daily.    Yes [provider]  atorvastatin (LIPITOR) 20 MG tablet Take 20 mg by mouth at bedtime.    Yes [provider]  bismuth subsalicylate (PEPTO BISMOL) 262 MG/15ML suspension Take 15 mLs by mouth every 2 (two) hours as needed for indigestion or diarrhea or loose stools. (max 6 doses in 24 hours)   Yes [provider]  ferrous sulfate 325 (65 FE) MG tablet Take 325 mg by mouth 2 (two) times daily with a meal.    Yes [provider]  fluticasone (FLONASE) 50 MCG/ACT nasal spray Place 2 sprays into both nostrils daily.   Yes [provider]  Fluticasone-Salmeterol (ADVAIR DISKUS) 250-50 MCG/DOSE AEPB Inhale 1 puff into the lungs 2 (two) times daily.   Yes [provider]  insulin glargine (LANTUS) 100 UNIT/ML injection Inject 0.1 mLs (10 Units total) into the skin daily. 05/17/19  Yes Bonnell Public Tublu, MD  lisinopril (PRINIVIL,ZESTRIL) 10 MG tablet Take 10 mg by mouth daily.    Yes [provider]  magnesium hydroxide (MILK OF MAGNESIA) 400 MG/5ML suspension Take 30 mLs by mouth daily as needed for moderate constipation.    Yes [provider]  Memantine HCl-Donepezil HCl (NAMZARIC) 28-10 MG CP24 Take 1 capsule by mouth daily. 05/15/19  Yes Swayze, Ava, DO  metFORMIN (GLUCOPHAGE) 500 MG tablet Take 500 mg by mouth 2 (two) times daily with a meal.    Yes [provider]  metoprolol tartrate (LOPRESSOR) 25 MG tablet Take 1 tablet (25 mg total) by mouth 2 (two) times daily. 05/15/19  Yes Swayze, Ava, DO  modafinil (PROVIGIL) 100 MG tablet Take 1 tablet (100 mg total) by mouth daily. 05/15/19  Yes Swayze, Ava, DO  omeprazole (PRILOSEC) 40 MG capsule Take 40 mg by  mouth daily.    Yes [provider]  perphenazine (TRILAFON) 4 MG tablet Take 1 tablet (4 mg total) by mouth 3 (three) times daily. 05/15/19  Yes Swayze, Ava, DO  Phenylephrine-DM-GG ([employer] TUSSIN COUGH/COLD CF) 5-10-100 MG/5ML LIQD Take 10 mLs by mouth every 6 (six) hours as needed (cough).   Yes [provider]  QUEtiapine (SEROQUEL) 200 MG tablet Take 200 mg by mouth at bedtime.   Yes [provider]   No Known Allergies  FAMILY HISTORY:  family history is not on file. SOCIAL HISTORY:  reports that she quit smoking about 2 years ago. Her smoking use included cigarettes. She smoked 0.25 packs per day. She has never used smokeless tobacco. She reports that she does not drink alcohol and does not use drugs.    Review of Systems:  Gen:  Denies  fever, sweats, chills weigh loss  HEENT: Denies blurred vision, double vision, ear pain, eye pain, hearing loss, nose bleeds, sore throat Cardiac:  No dizziness, chest pain or heaviness, chest tightness,edema, No JVD Resp:   No cough, -sputum production, -shortness of breath,-wheezing, -hemoptysis,  Gi: Denies swallowing difficulty, stomach pain, nausea or vomiting, diarrhea, constipation, bowel incontinence Gu:  Denies bladder incontinence, burning urine Ext:   Denies Joint pain, stiffness or swelling Skin: Denies  skin rash, easy bruising or bleeding or hives Endoc:  Denies polyuria, polydipsia , polyphagia or weight change Psych:   Denies depression, insomnia or hallucinations  Other:  All other systems negative     VITAL SIGNS: Temp:  [97.7 F (36.5 C)-98.7 F (37.1 C)] 97.9 F (36.6 C) (06/25 1600) Pulse Rate:  [40-43] 43 (06/25 1630) Resp:  [13-26] 26 (06/25 1630) BP: (71-155)/(29-122) 100/63 (06/25 1630) SpO2:  [94 %-100 %] 94 % (06/25 1630) Weight:  [96.1 kg-96.4 kg] 96.1 kg (06/25 1600)     SpO2: 94 % O2 Flow Rate (L/min): 2 L/min    Physical Examination:   GENERAL:NAD, no fevers, chills, no  weakness no fatigue HEAD: Normocephalic, atraumatic.  EYES: PERLA, EOMI No scleral icterus.  MOUTH: Moist mucosal membrane.  EAR, NOSE, THROAT: Clear without exudates. No external lesions.  NECK: Supple.  PULMONARY: CTA B/L no wheezing, rhonchi, crackles CARDIOVASCULAR: S1 and S2. Regular rate and rhythm. No murmurs GASTROINTESTINAL: Soft, nontender, nondistended. Positive bowel sounds.  MUSCULOSKELETAL: No swelling, clubbing, or edema.  NEUROLOGIC: No gross focal neurological deficits. 5/5 strength all extremities SKIN: No ulceration, lesions, rashes, or cyanosis.  PSYCHIATRIC: poor Insight,  ALL OTHER ROS ARE NEGATIVE   MEDICATIONS: I have reviewed all medications and confirmed regimen as documented    CULTURE RESULTS   Recent Results (from the past 240 hour(s))  SARS Coronavirus 2 by RT PCR (hospital order, performed in [facility] hospital lab) Nasopharyngeal Nasopharyngeal Swab     Status: None   Collection Time: 07/03/19  2:19 PM   Specimen: Nasopharyngeal Swab  Result Value Ref Range Status   SARS Coronavirus 2 NEGATIVE NEGATIVE Final    Comment: (NOTE) SARS-CoV-2 target nucleic acids are NOT DETECTED.  The SARS-CoV-2 RNA is generally detectable in upper and lower respiratory specimens during the acute phase of infection. The  lowest concentration of SARS-CoV-2 viral copies this assay can detect is 250 copies / mL. A negative result does not preclude SARS-CoV-2 infection and should not be used as the sole basis for treatment or other patient management decisions.  A negative result may occur with improper specimen collection / handling, submission of specimen other than nasopharyngeal swab, presence of viral mutation(s) within the areas targeted by this assay, and inadequate number of viral copies (<250 copies / mL). A negative result must be combined with clinical observations, patient history, and epidemiological information.  Fact Sheet for Patients:     StrictlyIdeas.no  Fact Sheet for Healthcare Providers: BankingDealers.co.za  This test is not yet approved or  cleared by the [place] FDA and has been authorized for detection and/or diagnosis of SARS-CoV-2 by FDA under an Emergency Use Authorization (EUA).  This EUA will remain in effect (meaning this test can be used) for the duration of the COVID-19 declaration under Section 564(b)(1) of the Act, 21 U.S.C. section 360bbb-3(b)(1), unless the authorization is terminated or revoked sooner.  Performed at [facility], [address] [place], [place] [address]   MRSA PCR Screening     Status: None   Collection Time: 07/04/19 12:20 PM   Specimen: Nasopharyngeal  Result Value Ref Range Status   MRSA by PCR NEGATIVE NEGATIVE Final    Comment:        The GeneXpert MRSA Assay (FDA approved for NASAL specimens only), is one component of a comprehensive MRSA colonization surveillance program. It is not intended to diagnose MRSA infection nor to guide or monitor treatment for MRSA infections. Performed at [facility], [address] [place], [place] [address]           IMAGING    [place] EKG SITE RITE  Result Date: 07/08/2019 If [facility] image not attached, placement could not be confirmed due to current cardiac rhythm.      ASSESSMENT AND PLAN SYNOPSIS  Acute systolic/diastolic heart failure Echocardiogram: EF of 30 to 35%, global hypokinesis, LVH, LV diastolic parameters indeterminate. Hold beta blockers for now duet o low HR    Atrial flutter with RVR- complicated by sinus pause Stop beta blockers for now On dopamine infusion Follow up cardiology recs ?need for TVP Will give glucagon injection as she received beta blockers this AM Start midodrine  ELECTROLYTES -follow labs as needed -replace as needed -pharmacy consultation and following   DVT/GI PRX ordered and  assessed TRANSFUSIONS AS NEEDED MONITOR FSBS I Assessed the need for Labs I Assessed the need for Foley I Assessed the need for Central Venous Line Family Discussion when available I Assessed the need for Mobilization I made an Assessment of medications to be adjusted accordingly Safety Risk assessment completed   Corrin [place], M.D.  [facility]  Medical Director [place] Director [facility] Cardio-Pulmonary Department

## 2019-07-08 NOTE — Progress Notes (Signed)
Notified by [employer] that Patient had 5 sec pause, Paged MD/Cardiologist. Patients heart rate is currently 50bpm Will continue to monitor.

## 2019-07-08 NOTE — Progress Notes (Signed)
PROGRESS NOTE    Cheryl [name]  OMV:[id number] DOB: 07-26-1940 DOA: 07/03/2019 PCP: Letta Median, MD   Brief Narrative:  HPI on 07/03/2019 by Dr. Ivor Costa [name] is a 79 y.o. female with medical history significant of hypertension, hyperlipidemia, diabetes mellitus, COPD, GERD, depression, anxiety, lung cancer (s/p of radiation therapy), dementia, iron deficiency anemia, CKD-3, who presents with shortness of breath.  Patient has dementia, and is a poor historian, therefore, most of the history is obtained by discussing the case with ED physician, per EMS report, and with the nursing staff.  History is limited. Patient has been having shortness of breath for several days, which has been progressively worsening.  Patient is not on oxygen at home, but requiring 4 L oxygen with oxygen saturation 100% in ED. Patient has dry cough, but denies chest pain.  No nausea, vomiting, diarrhea, abdominal pain.  Denies symptoms of UTI.  Patient moves all extremities.  Interim history Admitted for resp failure/ COPD Exacerbation.  Found to have elevated troponin with atrial flutter RVR, cardiology consulted.  Now transitioned off of amiodarone drip as well as heparin to oral medications. Assessment & Plan   Acute hypoxic respiratory failure secondary to COPD exacerbation as well as atrial fibrillation, CHF -Continues to have some wheezing on examination -Continue to treat underlying conditions -Patient completed azithromycin and steroids  Acute systolic/diastolic heart failure -Echocardiogram: EF of 30 to 35%, global hypokinesis, LVH, LV diastolic parameters indeterminate. -Cardiology consulted and appreciated-recommended transitioning metoprolol to metoprolol succinate 75 mg twice daily -Discussed with cardiology today, will add on low-dose losartan -Patient has had some acute kidney injury as well as soft blood pressures this admission-continue to monitor these for the next 24  hours -Cardiology also recommending Lasix 20 mg daily upon hospital discharge -Currently has a net even fluid balance.  Atrial flutter with RVR- complicated by sinus pause -Patient was placed on amiodarone drip as her rate was difficult to control with calcium channel or beta blockers -was transitioned to oral-amiodarone 400 mg twice daily, metoprolol 75 mg twice daily -Patient may need cardioversion within a few weeks versus a different strategy -Was on heparin drip and transitioned to Xarelto -Developed a 5-second sinus pause.  Cardiology was notified and gave patient a dose of atropine.  Metoprolol has now been held as well as amiodarone  Hypotension -After patient was given a dose of atropine, she was noted to be hypotensive -Cardiology would like patient transferred to ICU for dopamine drip  Acute kidney injury on chronic kidney disease, stage IIIa -On admission, creatinine was 1.6, peaked to 1.9 (baseline of 1.1-1.2) -Lasix, lisinopril held -Creatinine down to 1.49 -Continue to monitor BMP  Diabetes mellitus, type II -metformin held -Recent A1c 6.6  -Continue Lantus, insulin sliding scale and CBG monitoring  Obstructive sleep apnea -Continue CPAP  Essential hypertension -Cardizem, metoprolol-held -Lisinopril was held due to AKI however now that that has improved, losartan was started on 07/07/2019 however given her hypotension this is now been held  Hyperlipidemia -Continue statin  Dementia -Continue Aricept, Namenda  Obesity -BMI of 36 -Patient to follow-up with PCP for lifestyle modification  Deconditioning -PT recommending SNF -TOC made aware  DVT Prophylaxis  Xarelto  Code Status: Full  Family Communication: None at bedside  Disposition Plan:  Status is: Inpatient  Remains inpatient appropriate because:Inpatient level of care appropriate due to severity of illness- being treated for AFlutter, CHF-medications have been adjusted, will continue to monitor  for another 24 hours, or when cardiology  signs off.   Dispo: The patient is from: ALF              Anticipated d/c is to: SNF              Anticipated d/c date is: 2 days              Patient currently is not medically stable to d/c.   Consultants Cardiology  Procedures  Echocardiogram  Antibiotics   Anti-infectives (From admission, onward)   Start     Dose/Rate Route Frequency Ordered Stop   07/04/19 1000  azithromycin (ZITHROMAX) tablet 250 mg       "Followed by" Linked Group Details   250 mg Oral Daily 07/03/19 1837 07/07/19 0917   07/03/19 1845  azithromycin (ZITHROMAX) tablet 500 mg       "Followed by" Linked Group Details   500 mg Oral Daily 07/03/19 1837 07/03/19 1853      Subjective:   Cheryl [name] seen and examined today.  Patient with dementia and continuously repeats herself.  No complaints this morning.  Objective:   Vitals:   07/08/19 1319 07/08/19 1324 07/08/19 1326 07/08/19 1355  BP: (!) 73/29 (!) 79/53 (!) 75/52 (!) 71/55  Pulse:      Resp: 19  19 20   Temp: 97.7 F (36.5 C)     TempSrc: Oral     SpO2:      Weight:      Height:        Intake/Output Summary (Last 24 hours) at 07/08/2019 1422 Last data filed at 07/08/2019 0950 Gross per 24 hour  Intake 720 ml  Output 601 ml  Net 119 ml   Filed Weights   07/06/19 0419 07/07/19 0448 07/08/19 0605  Weight: 95.7 kg 92.9 kg 96.4 kg   Exam  General: Well developed, chronically ill appearing, NAD  HEENT: NCAT, mucous membranes moist.   Cardiovascular: S1 S2 auscultated, irregular- bradycardic  Respiratory: Diminished breath sounds however clear  Abdomen: Soft, nontender, nondistended, + bowel sounds  Extremities: warm dry without cyanosis clubbing or edema  Neuro: AAOx 1 (self only) dementia, follows some commands  Data Reviewed: I have personally reviewed following labs and imaging studies  CBC: Recent Labs  Lab 07/03/19 0947 07/04/19 0659 07/05/19 0207 07/06/19 0027  07/07/19 0657  WBC 7.1 6.5 8.0 9.8 8.4  HGB 10.5* 9.9* 9.7* 9.6* 11.3*  HCT 35.6* 32.7* 32.3* 30.4* 37.2  MCV 91.5 88.6 88.5 85.4 88.4  PLT 357 378 351 350 503   Basic Metabolic Panel: Recent Labs  Lab 07/04/19 0659 07/05/19 0207 07/06/19 0027 07/07/19 0657 07/08/19 0625  NA 142 143 141 140 138  K 4.1 4.1 3.9 4.3 3.8  CL 108 107 107 103 102  CO2 27 26 25 27 27   GLUCOSE 172* 265* 208* 172* 116*  BUN 38* 52* 64* 53* 52*  CREATININE 1.50* 1.93* 1.81* 1.44* 1.49*  CALCIUM 8.7* 8.6* 8.5* 8.8* 8.5*  MG  --   --   --   --  1.8   GFR: Estimated Creatinine Clearance: 35.6 mL/min (A) (by C-G formula based on SCr of 1.49 mg/dL (H)). Liver Function Tests: No results for input(s): AST, ALT, ALKPHOS, BILITOT, PROT, ALBUMIN in the last 168 hours. No results for input(s): LIPASE, AMYLASE in the last 168 hours. No results for input(s): AMMONIA in the last 168 hours. Coagulation Profile: No results for input(s): INR, PROTIME in the last 168 hours. Cardiac Enzymes: No results for input(s): CKTOTAL, CKMB,  CKMBINDEX, TROPONINI in the last 168 hours. BNP (last 3 results) No results for input(s): PROBNP in the last 8760 hours. HbA1C: No results for input(s): HGBA1C in the last 72 hours. CBG: Recent Labs  Lab 07/07/19 2051 07/07/19 2356 07/08/19 0400 07/08/19 0753 07/08/19 1142  GLUCAP 281* 222* 121* 88 139*   Lipid Profile: No results for input(s): CHOL, HDL, LDLCALC, TRIG, CHOLHDL, LDLDIRECT in the last 72 hours. Thyroid Function Tests: No results for input(s): TSH, T4TOTAL, FREET4, T3FREE, THYROIDAB in the last 72 hours. Anemia Panel: No results for input(s): VITAMINB12, FOLATE, FERRITIN, TIBC, IRON, RETICCTPCT in the last 72 hours. Urine analysis:    Component Value Date/Time   COLORURINE YELLOW (A) 05/09/2019 2335   APPEARANCEUR CLEAR (A) 05/09/2019 2335   APPEARANCEUR Hazy 07/26/2013 1013   LABSPEC 1.038 (H) 05/09/2019 2335   LABSPEC 1.029 07/26/2013 1013   PHURINE 5.0  05/09/2019 2335   GLUCOSEU 50 (A) 05/09/2019 2335   GLUCOSEU Negative 07/26/2013 1013   HGBUR NEGATIVE 05/09/2019 2335   BILIRUBINUR NEGATIVE 05/09/2019 2335   BILIRUBINUR Negative 07/26/2013 1013   KETONESUR 5 (A) 05/09/2019 2335   PROTEINUR 30 (A) 05/09/2019 2335   NITRITE NEGATIVE 05/09/2019 2335   LEUKOCYTESUR NEGATIVE 05/09/2019 2335   LEUKOCYTESUR Negative 07/26/2013 1013   Sepsis Labs: @LABRCNTIP (procalcitonin:4,lacticidven:4)  ) Recent Results (from the past 240 hour(s))  SARS Coronavirus 2 by RT PCR (hospital order, performed in [place] hospital lab) Nasopharyngeal Nasopharyngeal Swab     Status: None   Collection Time: 07/03/19  2:19 PM   Specimen: Nasopharyngeal Swab  Result Value Ref Range Status   SARS Coronavirus 2 NEGATIVE NEGATIVE Final    Comment: (NOTE) SARS-CoV-2 target nucleic acids are NOT DETECTED.  The SARS-CoV-2 RNA is generally detectable in upper and lower respiratory specimens during the acute phase of infection. The lowest concentration of SARS-CoV-2 viral copies this assay can detect is 250 copies / mL. A negative result does not preclude SARS-CoV-2 infection and should not be used as the sole basis for treatment or other patient management decisions.  A negative result may occur with improper specimen collection / handling, submission of specimen other than nasopharyngeal swab, presence of viral mutation(s) within the areas targeted by this assay, and inadequate number of viral copies (<250 copies / mL). A negative result must be combined with clinical observations, patient history, and epidemiological information.  Fact Sheet for Patients:   StrictlyIdeas.no  Fact Sheet for Healthcare Providers: BankingDealers.co.za  This test is not yet approved or  cleared by the [place] FDA and has been authorized for detection and/or diagnosis of SARS-CoV-2 by FDA under an Emergency Use  Authorization (EUA).  This EUA will remain in effect (meaning this test can be used) for the duration of the COVID-19 declaration under Section 564(b)(1) of the Act, 21 U.S.C. section 360bbb-3(b)(1), unless the authorization is terminated or revoked sooner.  Performed at [facility], [address] [place], [place] [address]   MRSA PCR Screening     Status: None   Collection Time: 07/04/19 12:20 PM   Specimen: Nasopharyngeal  Result Value Ref Range Status   MRSA by PCR NEGATIVE NEGATIVE Final    Comment:        The GeneXpert MRSA Assay (FDA approved for NASAL specimens only), is one component of a comprehensive MRSA colonization surveillance program. It is not intended to diagnose MRSA infection nor to guide or monitor treatment for MRSA infections. Performed at [facility], [address] [place], [place] [address]       Radiology Studies: No results found.   Scheduled Meds:  ALPRAZolam  0.25 mg Oral BID   atorvastatin  20 mg Oral QHS   dextromethorphan-guaiFENesin  1 tablet Oral BID   memantine  28 mg Oral Daily   And   donepezil  10 mg Oral QHS   ferrous sulfate  325 mg Oral BID WC   fluticasone  2 spray Each Nare Daily   insulin aspart  0-9 Units Subcutaneous Q4H   insulin glargine  7 Units Subcutaneous Daily   levalbuterol  0.63 mg Nebulization Q8H   modafinil  100 mg Oral Daily   mometasone-formoterol  2 puff Inhalation BID   pantoprazole  40 mg Oral Daily   perphenazine  4 mg Oral TID   QUEtiapine  200 mg Oral QHS   rivaroxaban  15 mg Oral Q supper   Continuous Infusions:   LOS: 5 days   Time Spent in minutes   30 minutes  Brionna Romanek D.O. on 07/08/2019 at 2:22 PM  Between 7am to 7pm - Please see pager noted on [email or web address]  After 7pm go to [email or web address]  And look for the night coverage person covering for me after hours  [facility] Office  [phone]

## 2019-07-08 NOTE — Progress Notes (Signed)
Progress Note  Patient Name: Cheryl [name] Date of Encounter: 07/08/2019  [facility] Cardiologist: Kathlyn Sacramento, MD   Subjective   Patient complained of being dizzy this morning.  She did not report yes to chest pain or shortness of breath, though her mental status remains poor.  Due to bradycardia and hypotension, she was transferred to the ICU on my recommendation this afternoon.  Inpatient Medications    Scheduled Meds: . ALPRAZolam  0.25 mg Oral BID  . atorvastatin  20 mg Oral QHS  . Chlorhexidine Gluconate Cloth  6 each Topical Daily  . dextromethorphan-guaiFENesin  1 tablet Oral BID  . memantine  28 mg Oral Daily   And  . donepezil  10 mg Oral QHS  . ferrous sulfate  325 mg Oral BID WC  . fluticasone  2 spray Each Nare Daily  . insulin aspart  0-9 Units Subcutaneous Q4H  . insulin glargine  7 Units Subcutaneous Daily  . levalbuterol  0.63 mg Nebulization Q8H  . [START ON 07/09/2019] midodrine  10 mg Oral TID WC  . modafinil  100 mg Oral Daily  . mometasone-formoterol  2 puff Inhalation BID  . pantoprazole  40 mg Oral Daily  . perphenazine  4 mg Oral TID  . QUEtiapine  200 mg Oral QHS  . rivaroxaban  15 mg Oral Q supper   Continuous Infusions: . DOPamine 5 mcg/kg/min (07/08/19 1616)   PRN Meds: acetaminophen, albuterol, alum & mag hydroxide-simeth, hydrALAZINE, magnesium hydroxide, ondansetron (ZOFRAN) IV   Vital Signs    Vitals:   07/08/19 1630 07/08/19 1700 07/08/19 1730 07/08/19 1800  BP: 100/63 (!) 107/55 107/64 116/66  Pulse: (!) 43  (!) 45   Resp: (!) 26 (!) 21 (!) 23 20  Temp:      TempSrc:      SpO2: 94%  100%   Weight:      Height:        Intake/Output Summary (Last 24 hours) at 07/08/2019 1818 Last data filed at 07/08/2019 1616 Gross per 24 hour  Intake 721.51 ml  Output 601 ml  Net 120.51 ml   Last 3 Weights 07/08/2019 07/08/2019 07/07/2019  Weight (lbs) 211 lb 13.8 oz 212 lb 8 oz 204 lb 14.4 oz  Weight (kg) 96.1 kg 96.389 kg 92.942  kg      Telemetry    Atrial fibrillation with conversion to sinus bradycardia with PVCs and junctional rhythm at 9:25 AM.  She had a 5-second posttermination pause in atrial fibrillation. - Personally Reviewed  ECG    No new tracing.  Physical Exam   GEN:  Elderly woman, lying in bed.  She is somnolent but arousable.  Notable diaphoresis. Neck:  No gross JVD, though evaluation is limited by body habitus. Cardiac:  Bradycardic but regular without murmurs. Respiratory:  Diminished breath sounds throughout with poor inspiratory effort.  No wheezes or crackles. GI: Soft, nontender, non-distended  MS: No edema; No deformity. Neuro:   Awake but not oriented. Psych: Unable to assess due to baseline dementia.  Labs    High Sensitivity Troponin:   Recent Labs  Lab 07/03/19 0947 07/03/19 1147 07/03/19 1526 07/03/19 1918  TROPONINIHS 50* 53* 42* 43*      Chemistry Recent Labs  Lab 07/06/19 0027 07/07/19 0657 07/08/19 0625  NA 141 140 138  K 3.9 4.3 3.8  CL 107 103 102  CO2 25 27 27   GLUCOSE 208* 172* 116*  BUN 64* 53* 52*  CREATININE 1.81* 1.44*  1.49*  CALCIUM 8.5* 8.8* 8.5*  GFRNONAA 27* 35* 34*  GFRAA 31* 40* 39*  ANIONGAP 9 10 9      Hematology Recent Labs  Lab 07/05/19 0207 07/06/19 0027 07/07/19 0657  WBC 8.0 9.8 8.4  RBC 3.65* 3.56* 4.21  HGB 9.7* 9.6* 11.3*  HCT 32.3* 30.4* 37.2  MCV 88.5 85.4 88.4  MCH 26.6 27.0 26.8  MCHC 30.0 31.6 30.4  RDW 16.7* 16.6* 16.6*  PLT 351 350 400    BNP Recent Labs  Lab 07/03/19 0947  BNP 1,303.1*     DDimer No results for input(s): DDIMER in the last 168 hours.   Radiology    [place] EKG SITE RITE  Result Date: 07/08/2019 If Site Rite image not attached, placement could not be confirmed due to current cardiac rhythm.   Cardiac Studies   Echocardiogram (07/04/2019): 1. Left ventricular ejection fraction, by estimation, is 30 to 35%. The  left ventricle has moderately decreased function. The left ventricle    demonstrates global hypokinesis. There is mild left ventricular  hypertrophy. Left ventricular diastolic  parameters are indeterminate.  2. Right ventricular systolic function is mildly reduced. The right  ventricular size is mildly enlarged. There is mildly elevated pulmonary  artery systolic pressure.  3. Left atrial size was mildly dilated.  4. Right atrial size was moderately dilated.  5. The mitral valve is normal in structure. Trivial mitral valve  regurgitation. No evidence of mitral stenosis.  6. Tricuspid valve regurgitation is moderate.  7. The aortic valve is normal in structure. Aortic valve regurgitation is  trivial. Mild aortic valve sclerosis is present, with no evidence of  aortic valve stenosis.  8. Moderately dilated pulmonary artery.   Patient Profile     79 y.o. female with history of type 2 diabetes mellitus, hypertension, hyperlipidemia, dementia, COPD, chronic kidney disease stage III, lung cancer status post radiation, anemia, depression, anxiety, and GERD, admitted with atrial flutter with rapid ventricular response and new systolic heart failure.  Assessment & Plan    Atrial fibrillation/flutter: The patient was in atrial fibrillation with reasonable rate control until 925 this morning when she converted to sinus rhythm followed by a junctional rhythm.  She had an initial 5-second posttermination pause.  Blood pressure has been low normal to hypotensive since then.  I administered atropine 0.5 mg x 1 with improvement in her junctional rate to the low 60s, though blood pressure remains soft (around 90/50).  Patient was diaphoretic and complained of malaise and dizziness.  She had received amiodarone 400 mg and metoprolol succinate 75 mg this morning shortly before converting from atrial fibrillation.  Patient transferred to ICU for closer monitoring in the setting of junctional rhythm.  Recommend initiation of dopamine 5 mcg/kg/min for heart rate and blood  pressure response.  Hopefully, this can be weaned off as effects of metoprolol and losartan dissipate.  Administration of glucagon could be considered as well to reverse beta-blockade effects.  If heart rate recovers, I would recommend continuing with oral amiodarone to prevent recurrence of atrial fibrillation/flutter.  Continue anticoagulation with renally adjusted rivaroxaban.  Systolic heart failure of uncertain chronicity: Ms. Noguchi appears grossly euvolemic.  In the setting of her bradycardia and hypotension this morning, I am concerned that she is developing cardiogenic shock.  Transferred to ICU for support with dopamine.  Hold metoprolol and losartan in the setting of hypotension and bradycardia.  If blood pressure and heart rate improved but the patient continues to demonstrate signs of low  output, I would recommend transitioning from dopamine to milrinone.  No plans for ischemia evaluation given comorbidities, particularly chronic kidney disease and advanced dementia.  If the patient continues to decline, consider obtaining lactate level and central venous O2 saturation once central venous access has been obtained (orders placed for PICC line).  This could help guide inotropic/vasopressor therapy.  We attempted to reach out to the patient's family but did not receive a response.  The patient is now critically ill and that risk for further decompensation.  Her prognosis is guarded and involvement of palliative care should be considered.  CRITICAL CARE Performed by: Nelva Bush, MD  Total critical care time: 40 minutes  Critical care time was exclusive of separately billable procedures and treating other patients.  Critical care was necessary to treat or prevent imminent or life-threatening deterioration.  Critical care was time spent personally by me (independent of midlevel providers or residents) on the following activities: development of treatment plan with patient  and/or surrogate as well as nursing, discussions with consultants, evaluation of patient's response to treatment, examination of patient, obtaining history from patient or surrogate, ordering and performing treatments and interventions, ordering and review of laboratory studies, ordering and review of radiographic studies, pulse oximetry and re-evaluation of patient's condition.  For questions or updates, please contact [place] Please consult [email or web address] for contact info under [facility] cardiology.  Signed, Nelva Bush, MD  07/08/2019, 6:18 PM

## 2019-07-08 NOTE — Plan of Care (Signed)

## 2019-07-08 NOTE — Progress Notes (Signed)
Peripherally Inserted Central Catheter Placement  The IV Nurse has discussed with the patient and/or persons authorized to consent for the patient, the purpose of this procedure and the potential benefits and risks involved with this procedure.  The benefits include less needle sticks, lab draws from the catheter, and the patient may be discharged home with the catheter. Risks include, but not limited to, infection, bleeding, blood clot (thrombus formation), and puncture of an artery; nerve damage and irregular heartbeat and possibility to perform a PICC exchange if needed/ordered by physician.  Alternatives to this procedure were also discussed.  Bard Power PICC patient education guide, fact sheet on infection prevention and patient information card has been provided to patient /or left at bedside.    PICC Placement Documentation  PICC Double Lumen 07/08/19 PICC Right Brachial 42 cm 0 cm (Active)  Indication for Insertion or Continuance of Line Vasoactive infusions 07/08/19 1855  Exposed Catheter (cm) 0 cm 07/08/19 1855  Site Assessment Clean;Dry;Intact 07/08/19 1855  Lumen #1 Status Saline locked;Flushed;Blood return noted 07/08/19 1855  Lumen #2 Status Flushed;Saline locked;Blood return noted 07/08/19 1855  Dressing Type Transparent 07/08/19 1855  Dressing Status Clean;Dry;Intact;Antimicrobial disc in place 07/08/19 1855  Safety [place] Not Applicable 08/27/46 1856  Dressing Intervention New dressing 07/08/19 1855  Dressing Change Due 07/15/19 07/08/19 1855       Gordan Payment 07/08/2019, 6:56 PM

## 2019-07-08 NOTE — Progress Notes (Signed)
   07/08/19 1319  Vitals  Temp 97.7 F (36.5 C)  Temp Source Oral  BP (!) 73/29  BP Location Right Arm  BP Method Automatic  Patient Position (if appropriate) Lying  ECG Heart Rate (!) 50  Resp 19  MEWS Score  MEWS Temp 0  MEWS Systolic 2  MEWS Pulse 1  MEWS RR 0  MEWS LOC 0  MEWS Score 3  MEWS Score Color Yellow   MD aware.

## 2019-07-08 NOTE — Progress Notes (Signed)
OT Cancellation Note  Patient Details Name: Cheryl [name] MRN: [id number] DOB: [birth year]   Cancelled Treatment:    Reason Eval/Treat Not Completed: Medical issues which prohibited therapy. Chart reviewed. Pt very recently transferred to ICU due to need for Dopamine drip 2/2 hypotension. OT consult noted to have continue at transfer in place. Will hold this date and re-attempt at later date/time as pt is medically appropriate.   Jeni Salles, MPH, MS, OTR/L ascom [phone] 07/08/19, 3:57 PM

## 2019-07-08 NOTE — Care Management Important Message (Signed)
Important Message  Patient Details  Name: Cheryl [name] MRN: [id number] Date of Birth: 01-Jul-1940   Medicare Important Message Given:  Yes     [name] 07/08/2019, 12:59 PM

## 2019-07-09 DIAGNOSIS — R570 Cardiogenic shock: Secondary | ICD-10-CM

## 2019-07-09 LAB — CBC
HCT: 33.9 % — ABNORMAL LOW (ref 36.0–46.0)
Hemoglobin: 10.8 g/dL — ABNORMAL LOW (ref 12.0–15.0)
MCH: 27.3 pg (ref 26.0–34.0)
MCHC: 31.9 g/dL (ref 30.0–36.0)
MCV: 85.6 fL (ref 80.0–100.0)
Platelets: 379 10*3/uL (ref 150–400)
RBC: 3.96 MIL/uL (ref 3.87–5.11)
RDW: 16.4 % — ABNORMAL HIGH (ref 11.5–15.5)
WBC: 9.9 10*3/uL (ref 4.0–10.5)
nRBC: 1.3 % — ABNORMAL HIGH (ref 0.0–0.2)

## 2019-07-09 LAB — GLUCOSE, CAPILLARY
Glucose-Capillary: 124 mg/dL — ABNORMAL HIGH (ref 70–99)
Glucose-Capillary: 141 mg/dL — ABNORMAL HIGH (ref 70–99)
Glucose-Capillary: 148 mg/dL — ABNORMAL HIGH (ref 70–99)
Glucose-Capillary: 164 mg/dL — ABNORMAL HIGH (ref 70–99)
Glucose-Capillary: 175 mg/dL — ABNORMAL HIGH (ref 70–99)
Glucose-Capillary: 85 mg/dL (ref 70–99)

## 2019-07-09 LAB — BASIC METABOLIC PANEL
Anion gap: 8 (ref 5–15)
BUN: 60 mg/dL — ABNORMAL HIGH (ref 8–23)
CO2: 26 mmol/L (ref 22–32)
Calcium: 8.9 mg/dL (ref 8.9–10.3)
Chloride: 101 mmol/L (ref 98–111)
Creatinine, Ser: 1.88 mg/dL — ABNORMAL HIGH (ref 0.44–1.00)
GFR calc Af Amer: 29 mL/min — ABNORMAL LOW (ref >60)
GFR calc non Af Amer: 25 mL/min — ABNORMAL LOW (ref >60)
Glucose, Bld: 208 mg/dL — ABNORMAL HIGH (ref 70–99)
Potassium: 4.5 mmol/L (ref 3.5–5.1)
Sodium: 135 mmol/L (ref 135–145)

## 2019-07-09 NOTE — Progress Notes (Signed)
Pt alert and oriented to self, at baseline, in good spirits. Good appetite. VSS. Off Dopamine drip and BP adequate, HR in 50s. HR in 40s when sleeping, Per MD this is okay. Symmetrical movement in all extremities. Voiding adequate. Will continue to monitor.

## 2019-07-09 NOTE — Progress Notes (Signed)
PROGRESS NOTE    Cheryl [name]  IRC:[id number] DOB: 22-May-1940 DOA: 07/03/2019 PCP: Letta Median, MD   Brief Narrative:  HPI on 07/03/2019 by Dr. Ivor Costa Miles Borkowski is a 79 y.o. female with medical history significant of hypertension, hyperlipidemia, diabetes mellitus, COPD, GERD, depression, anxiety, lung cancer (s/p of radiation therapy), dementia, iron deficiency anemia, CKD-3, who presents with shortness of breath.  Patient has dementia, and is a poor historian, therefore, most of the history is obtained by discussing the case with ED physician, per EMS report, and with the nursing staff.  History is limited. Patient has been having shortness of breath for several days, which has been progressively worsening.  Patient is not on oxygen at home, but requiring 4 L oxygen with oxygen saturation 100% in ED. Patient has dry cough, but denies chest pain.  No nausea, vomiting, diarrhea, abdominal pain.  Denies symptoms of UTI.  Patient moves all extremities.  Interim history Admitted for resp failure/ COPD Exacerbation.  Found to have elevated troponin with atrial flutter RVR, cardiology consulted.  On 07/08/2019, patient developed a junctional rhythm with a sinus pause and became bradycardic and was given atropine.  She was then transferred to the ICU and placed on a dopamine drip given her hypotension.  PCCM was consulted Assessment & Plan   Acute hypoxic respiratory failure secondary to COPD exacerbation as well as atrial fibrillation, CHF -Continues to have some wheezing on examination -Continue to treat underlying conditions -Patient completed azithromycin and steroids  Acute systolic/diastolic heart failure -Echocardiogram: EF of 30 to 35%, global hypokinesis, LVH, LV diastolic parameters indeterminate. -Cardiology consulted and appreciated-recommended transitioning metoprolol to metoprolol succinate 75 mg twice daily -Patient has had some acute kidney injury as well as soft  blood pressures this admission-continue to monitor these for the next 24 hours -Cardiology also recommending Lasix 20 mg daily upon hospital discharge -Currently has a net even fluid balance.  Atrial flutter with RVR- complicated by sinus pause -Patient was placed on amiodarone drip as her rate was difficult to control with calcium channel or beta blockers -was transitioned to oral-amiodarone 400 mg twice daily, metoprolol 75 mg twice daily -Patient may need cardioversion within a few weeks versus a different strategy -Was on heparin drip and transitioned to Xarelto -On 07/08/2019, patient developed a 5-second sinus pause.  Cardiology was notified and gave patient a dose of atropine.  Metoprolol has now been held as well as amiodarone -Earlier today, she was still on dopamine drip however this is now been discontinued as patient is now converted to sinus rhythm.  Hypotension -After patient was given a dose of atropine, she was noted to be hypotensive -patient transferred to ICU for dopamine drip -Now off dopamine drip, BP appears stable  Acute kidney injury on chronic kidney disease, stage IIIa -On admission, creatinine was 1.6, peaked to 1.9 (baseline of 1.1-1.2) -Lasix, lisinopril held -Creatinine up to 1.88 today -Continue to monitor BMP  Diabetes mellitus, type II -metformin held -Recent A1c 6.6  -Continue Lantus, insulin sliding scale and CBG monitoring  Obstructive sleep apnea -Continue CPAP  Essential hypertension -Cardizem, metoprolol-held -Lisinopril was held due to AKI however now that that has improved, losartan was started on 07/07/2019 however given her hypotension this is now been held  Hyperlipidemia -Continue statin  Dementia -Continue Aricept, Namenda  Obesity -BMI of 36 -Patient to follow-up with PCP for lifestyle modification  Deconditioning -PT recommending SNF -TOC made aware  DVT Prophylaxis  Xarelto  Code Status: Full  Family Communication:  None at bedside  Disposition Plan:  Status is: Inpatient  Remains inpatient appropriate because:Inpatient level of care appropriate due to severity of illness- being treated for AFlutter, CHF, hypotension.   Dispo: The patient is from: ALF              Anticipated d/c is to: SNF              Anticipated d/c date is: 2 days              Patient currently is not medically stable to d/c.   Consultants Cardiology PCCM  Procedures  Echocardiogram  Antibiotics   Anti-infectives (From admission, onward)   Start     Dose/Rate Route Frequency Ordered Stop   07/04/19 1000  azithromycin (ZITHROMAX) tablet 250 mg       "Followed by" Linked Group Details   250 mg Oral Daily 07/03/19 1837 07/07/19 0917   07/03/19 1845  azithromycin (ZITHROMAX) tablet 500 mg       "Followed by" Linked Group Details   500 mg Oral Daily 07/03/19 1837 07/03/19 1853      Subjective:   Cheryl [name] seen and examined today.  Patient with dementia and continuously repeats herself.  No complaints this morning.  She denies chest pain or shortness of breath, abdominal pain, nausea or vomiting, dizziness or headache  Objective:   Vitals:   07/09/19 1000 07/09/19 1030 07/09/19 1100 07/09/19 1130  BP: (!) 101/55 101/66 106/62 114/60  Pulse: (!) 53 (!) 46 (!) 45 (!) 47  Resp: (!) 24 19 (!) 22 18  Temp:    98.5 F (36.9 C)  TempSrc:    Oral  SpO2: 100% 100% 100% 100%  Weight:      Height:        Intake/Output Summary (Last 24 hours) at 07/09/2019 1201 Last data filed at 07/09/2019 1140 Gross per 24 hour  Intake 228.75 ml  Output 1000 ml  Net -771.25 ml   Filed Weights   07/08/19 0605 07/08/19 1600 07/09/19 0437  Weight: 96.4 kg 96.1 kg 98.5 kg   Exam  General: Well developed, chronically ill appearing, NAD  HEENT: NCAT, mucous membranes moist.   Cardiovascular: S1 S2 auscultated, bradycardic  Respiratory: Diminished breath sounds however clear  Abdomen: Soft, nontender, nondistended, + bowel  sounds  Extremities: warm dry without cyanosis clubbing or edema  Neuro: AAOx 1 (self only) dementia, follows some commands  Psych: Appropriate   Data Reviewed: I have personally reviewed following labs and imaging studies  CBC: Recent Labs  Lab 07/04/19 0659 07/05/19 0207 07/06/19 0027 07/07/19 0657 07/09/19 0438  WBC 6.5 8.0 9.8 8.4 9.9  HGB 9.9* 9.7* 9.6* 11.3* 10.8*  HCT 32.7* 32.3* 30.4* 37.2 33.9*  MCV 88.6 88.5 85.4 88.4 85.6  PLT 378 351 350 400 389   Basic Metabolic Panel: Recent Labs  Lab 07/05/19 0207 07/06/19 0027 07/07/19 0657 07/08/19 0625 07/09/19 0438  NA 143 141 140 138 135  K 4.1 3.9 4.3 3.8 4.5  CL 107 107 103 102 101  CO2 26 25 27 27 26   GLUCOSE 265* 208* 172* 116* 208*  BUN 52* 64* 53* 52* 60*  CREATININE 1.93* 1.81* 1.44* 1.49* 1.88*  CALCIUM 8.6* 8.5* 8.8* 8.5* 8.9  MG  --   --   --  1.8  --    GFR: Estimated Creatinine Clearance: 28.6 mL/min (A) (by C-G formula based on SCr of 1.88 mg/dL (H)). Liver Function Tests: No results  for input(s): AST, ALT, ALKPHOS, BILITOT, PROT, ALBUMIN in the last 168 hours. No results for input(s): LIPASE, AMYLASE in the last 168 hours. No results for input(s): AMMONIA in the last 168 hours. Coagulation Profile: No results for input(s): INR, PROTIME in the last 168 hours. Cardiac Enzymes: No results for input(s): CKTOTAL, CKMB, CKMBINDEX, TROPONINI in the last 168 hours. BNP (last 3 results) No results for input(s): PROBNP in the last 8760 hours. HbA1C: No results for input(s): HGBA1C in the last 72 hours. CBG: Recent Labs  Lab 07/08/19 1922 07/08/19 2317 07/09/19 0432 07/09/19 0737 07/09/19 1141  GLUCAP 131* 170* 175* 148* 164*   Lipid Profile: No results for input(s): CHOL, HDL, LDLCALC, TRIG, CHOLHDL, LDLDIRECT in the last 72 hours. Thyroid Function Tests: No results for input(s): TSH, T4TOTAL, FREET4, T3FREE, THYROIDAB in the last 72 hours. Anemia Panel: No results for input(s):  VITAMINB12, FOLATE, FERRITIN, TIBC, IRON, RETICCTPCT in the last 72 hours. Urine analysis:    Component Value Date/Time   COLORURINE YELLOW (A) 05/09/2019 2335   APPEARANCEUR CLEAR (A) 05/09/2019 2335   APPEARANCEUR Hazy 07/26/2013 1013   LABSPEC 1.038 (H) 05/09/2019 2335   LABSPEC 1.029 07/26/2013 1013   PHURINE 5.0 05/09/2019 2335   GLUCOSEU 50 (A) 05/09/2019 2335   GLUCOSEU Negative 07/26/2013 1013   HGBUR NEGATIVE 05/09/2019 2335   BILIRUBINUR NEGATIVE 05/09/2019 2335   BILIRUBINUR Negative 07/26/2013 1013   KETONESUR 5 (A) 05/09/2019 2335   PROTEINUR 30 (A) 05/09/2019 2335   NITRITE NEGATIVE 05/09/2019 2335   LEUKOCYTESUR NEGATIVE 05/09/2019 2335   LEUKOCYTESUR Negative 07/26/2013 1013   Sepsis Labs: @LABRCNTIP (procalcitonin:4,lacticidven:4)  ) Recent Results (from the past 240 hour(s))  SARS Coronavirus 2 by RT PCR (hospital order, performed in [place] hospital lab) Nasopharyngeal Nasopharyngeal Swab     Status: None   Collection Time: 07/03/19  2:19 PM   Specimen: Nasopharyngeal Swab  Result Value Ref Range Status   SARS Coronavirus 2 NEGATIVE NEGATIVE Final    Comment: (NOTE) SARS-CoV-2 target nucleic acids are NOT DETECTED.  The SARS-CoV-2 RNA is generally detectable in upper and lower respiratory specimens during the acute phase of infection. The lowest concentration of SARS-CoV-2 viral copies this assay can detect is 250 copies / mL. A negative result does not preclude SARS-CoV-2 infection and should not be used as the sole basis for treatment or other patient management decisions.  A negative result may occur with improper specimen collection / handling, submission of specimen other than nasopharyngeal swab, presence of viral mutation(s) within the areas targeted by this assay, and inadequate number of viral copies (<250 copies / mL). A negative result must be combined with clinical observations, patient history, and epidemiological information.  Fact  Sheet for Patients:   StrictlyIdeas.no  Fact Sheet for Healthcare Providers: BankingDealers.co.za  This test is not yet approved or  cleared by the [place] FDA and has been authorized for detection and/or diagnosis of SARS-CoV-2 by FDA under an Emergency Use Authorization (EUA).  This EUA will remain in effect (meaning this test can be used) for the duration of the COVID-19 declaration under Section 564(b)(1) of the Act, 21 U.S.C. section 360bbb-3(b)(1), unless the authorization is terminated or revoked sooner.  Performed at [facility], [address] [place], [place] [address]   MRSA PCR Screening     Status: None   Collection Time: 07/04/19 12:20 PM   Specimen: Nasopharyngeal  Result Value Ref Range Status   MRSA by PCR NEGATIVE NEGATIVE Final  Comment:        The GeneXpert MRSA Assay (FDA approved for NASAL specimens only), is one component of a comprehensive MRSA colonization surveillance program. It is not intended to diagnose MRSA infection nor to guide or monitor treatment for MRSA infections. Performed at [facility], [address] [place], [place] [address]   MRSA PCR Screening     Status: None   Collection Time: 07/08/19  4:00 PM   Specimen: Nasal Mucosa; Nasopharyngeal  Result Value Ref Range Status   MRSA by PCR NEGATIVE NEGATIVE Final    Comment:        The GeneXpert MRSA Assay (FDA approved for NASAL specimens only), is one component of a comprehensive MRSA colonization surveillance program. It is not intended to diagnose MRSA infection nor to guide or monitor treatment for MRSA infections. Performed at [facility], [address] [place], [place] [address]       Radiology Studies: [place] EKG SITE RITE  Result Date: 07/08/2019 If [facility] image not attached, placement could not be confirmed due to current cardiac rhythm.    Scheduled Meds: . ALPRAZolam   0.25 mg Oral BID  . atorvastatin  20 mg Oral QHS  . Chlorhexidine Gluconate Cloth  6 each Topical Daily  . dextromethorphan-guaiFENesin  1 tablet Oral BID  . memantine  28 mg Oral Daily   And  . donepezil  10 mg Oral QHS  . ferrous sulfate  325 mg Oral BID WC  . fluticasone  2 spray Each Nare Daily  . insulin aspart  0-9 Units Subcutaneous Q4H  . insulin glargine  7 Units Subcutaneous Daily  . levalbuterol  0.63 mg Nebulization Q8H  . midodrine  10 mg Oral TID WC  . modafinil  100 mg Oral Daily  . mometasone-formoterol  2 puff Inhalation BID  . pantoprazole  40 mg Oral Daily  . perphenazine  4 mg Oral TID  . QUEtiapine  200 mg Oral QHS  . rivaroxaban  15 mg Oral Q supper  . sodium chloride flush  10-40 mL Intracatheter Q12H   Continuous Infusions:   LOS: 6 days   Time Spent in minutes   30 minutes  Raquel Sayres D.O. on 07/09/2019 at 12:01 PM  Between 7am to 7pm - Please see pager noted on [email or web address]  After 7pm go to [email or web address]  And look for the night coverage person covering for me after hours  [facility] Office  [phone]

## 2019-07-09 NOTE — Progress Notes (Signed)
CRITICAL CARE NOTE 79 y.o.femalewith medical history significant ofhypertension, hyperlipidemia, diabetes mellitus, COPD, GERD, depression, anxiety, lung cancer (s/p of radiation therapy), dementia, iron deficiency anemia, CKD-3, who presents with shortness of breath.  Patient hasdementia, and alert and oriented to person  Patient has been having shortness of breath for several days, which has been progressively worsening. Patient is not on oxygen at home, but requiring 4 L oxygen with oxygen saturation 100% in ED. Patient has dry cough, but denies chest pain. No nausea,vomiting, diarrhea, abdominal pain. Denies symptoms of UTI. Patient moves all extremities.  Interim history Admitted for resp failure/ COPD Exacerbation.  Found to have elevated troponin with atrial flutter RVR, cardiology consulted. Now transitioned off of amiodarone drip as well as heparin to oral medications.  She had low BP and low HR, transferred to ICU for dopamine infusion  EVENTS 6/25 transferred to ICU for dopamine  6/26 cardiogenic shock on dopamine   CC  follow up respiratory failure  SUBJECTIVE Patient remains critically ill Prognosis is guarded On biPAP High risk for intubation and cardiac arrest   BP 115/66   Pulse (!) 44   Temp 97.6 F (36.4 C) (Oral)   Resp (!) 21   Ht 5\' 4"  (1.626 m)   Wt 98.5 kg   SpO2 99%   BMI 37.27 kg/m    I/O last 3 completed shifts: In: 653.7 [P.O.:480; I.V.:173.7] Out: 1251 [Urine:1250; Stool:1] No intake/output data recorded.  SpO2: 99 % O2 Flow Rate (L/min): 2 L/min  Estimated body mass index is 37.27 kg/m as calculated from the following:   Height as of this encounter: 5\' 4"  (1.626 m).   Weight as of this encounter: 98.5 kg.  SIGNIFICANT EVENTS   REVIEW OF SYSTEMS  PATIENT IS UNABLE TO PROVIDE COMPLETE REVIEW OF SYSTEMS DUE TO SEVERE CRITICAL ILLNESS        PHYSICAL EXAMINATION:  GENERAL:critically ill appearing, +resp  distress HEAD: Normocephalic, atraumatic.  EYES: Pupils equal, round, reactive to light.  No scleral icterus.  MOUTH: Moist mucosal membrane. NECK: Supple.  PULMONARY: +rhonchi, +wheezing CARDIOVASCULAR: S1 and S2. Regular rate and rhythm. No murmurs, rubs, or gallops.  GASTROINTESTINAL: Soft, nontender, -distended.  Positive bowel sounds.   MUSCULOSKELETAL: + edema.  NEUROLOGIC: obtunded, GCS<8 SKIN:intact,warm,dry  MEDICATIONS: I have reviewed all medications and confirmed regimen as documented   CULTURE RESULTS   Recent Results (from the past 240 hour(s))  SARS Coronavirus 2 by RT PCR (hospital order, performed in [facility] hospital lab) Nasopharyngeal Nasopharyngeal Swab     Status: None   Collection Time: 07/03/19  2:19 PM   Specimen: Nasopharyngeal Swab  Result Value Ref Range Status   SARS Coronavirus 2 NEGATIVE NEGATIVE Final    Comment: (NOTE) SARS-CoV-2 target nucleic acids are NOT DETECTED.  The SARS-CoV-2 RNA is generally detectable in upper and lower respiratory specimens during the acute phase of infection. The lowest concentration of SARS-CoV-2 viral copies this assay can detect is 250 copies / mL. A negative result does not preclude SARS-CoV-2 infection and should not be used as the sole basis for treatment or other patient management decisions.  A negative result may occur with improper specimen collection / handling, submission of specimen other than nasopharyngeal swab, presence of viral mutation(s) within the areas targeted by this assay, and inadequate number of viral copies (<250 copies / mL). A negative result must be combined with clinical observations, patient history, and epidemiological information.  Fact Sheet for Patients:   StrictlyIdeas.no  Fact Sheet  for Healthcare Providers: BankingDealers.co.za  This test is not yet approved or  cleared by the [place] and has been authorized for  detection and/or diagnosis of SARS-CoV-2 by FDA under an Emergency Use Authorization (EUA).  This EUA will remain in effect (meaning this test can be used) for the duration of the COVID-19 declaration under Section 564(b)(1) of the Act, 21 U.S.C. section 360bbb-3(b)(1), unless the authorization is terminated or revoked sooner.  Performed at [facility], [address] [place], [place] [address]   MRSA PCR Screening     Status: None   Collection Time: 07/04/19 12:20 PM   Specimen: Nasopharyngeal  Result Value Ref Range Status   MRSA by PCR NEGATIVE NEGATIVE Final    Comment:        The GeneXpert MRSA Assay (FDA approved for NASAL specimens only), is one component of a comprehensive MRSA colonization surveillance program. It is not intended to diagnose MRSA infection nor to guide or monitor treatment for MRSA infections. Performed at [facility], [address] [place], [place] [address]   MRSA PCR Screening     Status: None   Collection Time: 07/08/19  4:00 PM   Specimen: Nasal Mucosa; Nasopharyngeal  Result Value Ref Range Status   MRSA by PCR NEGATIVE NEGATIVE Final    Comment:        The GeneXpert MRSA Assay (FDA approved for NASAL specimens only), is one component of a comprehensive MRSA colonization surveillance program. It is not intended to diagnose MRSA infection nor to guide or monitor treatment for MRSA infections. Performed at [facility], [address] [place], [place] [address]           IMAGING    [place] EKG SITE RITE  Result Date: 07/08/2019 If [facility] image not attached, placement could not be confirmed due to current cardiac rhythm.  BMP Latest Ref Rng & Units 07/09/2019 07/08/2019 07/07/2019  Glucose 70 - 99 mg/dL 208(H) 116(H) 172(H)  BUN 8 - 23 mg/dL 60(H) 52(H) 53(H)  Creatinine 0.44 - 1.00 mg/dL 1.88(H) 1.49(H) 1.44(H)  Sodium 135 - 145 mmol/L 135 138 140  Potassium 3.5 - 5.1 mmol/L 4.5 3.8 4.3   Chloride 98 - 111 mmol/L 101 102 103  CO2 22 - 32 mmol/L 26 27 27   Calcium 8.9 - 10.3 mg/dL 8.9 8.5(L) 8.8(L)        ASSESSMENT AND PLAN SYNOPSIS Acute systolic/diastolic heart failure Echocardiogram: EF of 30 to 35%, global hypokinesis, LVH, LV diastolic parameters indeterminate. Hold beta blockers for now duet o low HR   Severe ACUTE Hypoxic and Hypercapnic Respiratory Failure due to acute systolic CHF exacerbation -on biPAP High risk for intubation  ACUTE SYSTOLIC CARDIAC FAILURE- EF -oxygen as needed -Lasix as tolerated -follow up cardiac enzymes as indicated -follow up cardiology recs   Morbid obesity, possible OSA.   Will certainly impact respiratory mechanics,   ACUTE KIDNEY INJURY/Renal Failure -continue Foley Catheter-assess need -Avoid nephrotoxic agents -Follow urine output, BMP -Ensure adequate renal perfusion, optimize oxygenation -Renal dose medications     NEUROLOGY Acute toxic metabolic encephalopathy from shock  SHOCK-CARDIOGENIC -use vasopressors to keep MAP>65 -follow ABG and LA -follow up cultures  CARDIAC ICU monitoring  GI GI PROPHYLAXIS as indicated  NUTRITIONAL STATUS Nutrition Status:         DIET-->NPO Constipation protocol as indicated  ENDO - will use ICU hypoglycemic\Hyperglycemia protocol if indicated     ELECTROLYTES -follow labs as needed -replace as needed -pharmacy  consultation and following   DVT/GI PRX ordered and assessed TRANSFUSIONS AS NEEDED MONITOR FSBS I Assessed the need for Labs I Assessed the need for Foley I Assessed the need for Central Venous Line Family Discussion when available I Assessed the need for Mobilization I made an Assessment of medications to be adjusted accordingly Safety Risk assessment completed   CASE DISCUSSED IN MULTIDISCIPLINARY ROUNDS WITH ICU TEAM  Critical Care Time devoted to patient care services described in this note is 32 minutes.   Overall, patient is  critically ill, prognosis is guarded.  Patient with Multiorgan failure and at high risk for cardiac arrest and death.    Corrin [place], M.D.  [facility]  Medical Director [place] Director [facility] Cardio-Pulmonary Department

## 2019-07-09 NOTE — Progress Notes (Signed)
Progress Note  Patient Name: Cheryl [name] Date of Encounter: 07/09/2019  [facility] Cardiologist: Kathlyn [place], MD    Subjective   79 year old female was admitted with atrial fibrillation/atrial flutter.  She converted back to sinus rhythm and developed a junctional bradycardia.  She improved with some atropine. She was transferred to the ICU several days ago.  She is been on dopamine because of her bradycardia.  She is on oral amiodarone and is on Xarelto. She has fairly severe dementia.  Inpatient Medications    Scheduled Meds: . ALPRAZolam  0.25 mg Oral BID  . atorvastatin  20 mg Oral QHS  . Chlorhexidine Gluconate Cloth  6 each Topical Daily  . dextromethorphan-guaiFENesin  1 tablet Oral BID  . memantine  28 mg Oral Daily   And  . donepezil  10 mg Oral QHS  . ferrous sulfate  325 mg Oral BID WC  . fluticasone  2 spray Each Nare Daily  . insulin aspart  0-9 Units Subcutaneous Q4H  . insulin glargine  7 Units Subcutaneous Daily  . levalbuterol  0.63 mg Nebulization Q8H  . midodrine  10 mg Oral TID WC  . modafinil  100 mg Oral Daily  . mometasone-formoterol  2 puff Inhalation BID  . pantoprazole  40 mg Oral Daily  . perphenazine  4 mg Oral TID  . QUEtiapine  200 mg Oral QHS  . rivaroxaban  15 mg Oral Q supper  . sodium chloride flush  10-40 mL Intracatheter Q12H   Continuous Infusions: . DOPamine 9 mcg/kg/min (07/09/19 0600)   PRN Meds: acetaminophen, albuterol, alum & mag hydroxide-simeth, hydrALAZINE, magnesium hydroxide, ondansetron (ZOFRAN) IV, sodium chloride flush   Vital Signs    Vitals:   07/09/19 0500 07/09/19 0600 07/09/19 0800 07/09/19 0900  BP: 120/67 115/66 (!) 144/58 (!) 152/64  Pulse: (!) 44 (!) 44 100 (!) 110  Resp: 18 (!) 21 20 (!) 23  Temp:   97.8 F (36.6 C)   TempSrc:   Axillary   SpO2: 97% 99% 97% 100%  Weight:      Height:        Intake/Output Summary (Last 24 hours) at 07/09/2019 8119 Last data filed at 07/09/2019 0600 Gross  per 24 hour  Intake 653.66 ml  Output 650 ml  Net 3.66 ml   Last 3 Weights 07/09/2019 07/08/2019 07/08/2019  Weight (lbs) 217 lb 2.5 oz 211 lb 13.8 oz 212 lb 8 oz  Weight (kg) 98.5 kg 96.1 kg 96.389 kg      Telemetry    Sinus brady with occasional junctional beat / PVC  - Personally Reviewed  ECG     - Personally Reviewed  Physical Exam   GEN:  moderately obese, elderly female  Neck: No JVD Cardiac: Irreg. Irreg.   Bradycardic   Respiratory: Clear to auscultation bilaterally. GI: Soft, nontender, non-distended  MS: No edema; No deformity. Neuro:  Nonfocal  Psych:  Difficult to assess.    Labs    High Sensitivity Troponin:   Recent Labs  Lab 07/03/19 0947 07/03/19 1147 07/03/19 1526 07/03/19 1918  TROPONINIHS 50* 53* 42* 43*      Chemistry Recent Labs  Lab 07/07/19 0657 07/08/19 0625 07/09/19 0438  NA 140 138 135  K 4.3 3.8 4.5  CL 103 102 101  CO2 27 27 26   GLUCOSE 172* 116* 208*  BUN 53* 52* 60*  CREATININE 1.44* 1.49* 1.88*  CALCIUM 8.8* 8.5* 8.9  GFRNONAA 35* 34* 25*  GFRAA 40*  88* 29*  ANIONGAP 10 9 8      Hematology Recent Labs  Lab 07/06/19 0027 07/07/19 0657 07/09/19 0438  WBC 9.8 8.4 9.9  RBC 3.56* 4.21 3.96  HGB 9.6* 11.3* 10.8*  HCT 30.4* 37.2 33.9*  MCV 85.4 88.4 85.6  MCH 27.0 26.8 27.3  MCHC 31.6 30.4 31.9  RDW 16.6* 16.6* 16.4*  PLT 350 400 379    BNP Recent Labs  Lab 07/03/19 0947  BNP 1,303.1*     DDimer No results for input(s): DDIMER in the last 168 hours.   Radiology    [place] EKG SITE RITE  Result Date: 07/08/2019 If Site Rite image not attached, placement could not be confirmed due to current cardiac rhythm.   Cardiac Studies      Patient Profile     79 y.o. female admitted with atrial fib, chronic systolic CHF,   Assessment & Plan    1.  Atrial fib / flutter:   Has converted to sinus rhythm Cont xarelto  2.   Bradycardia :  She is currently off the dopamine ,  Off amio  3,  Dementia :   Cont  meds.   Our team has tried to  Contact her son but has not been able to   Cont supportive care       For questions or updates, please contact [place] Please consult [email or web address] for contact info under        Signed, Mertie Moores, MD  07/09/2019, 9:23 AM

## 2019-07-10 LAB — CBC
HCT: 31.8 % — ABNORMAL LOW (ref 36.0–46.0)
Hemoglobin: 10.2 g/dL — ABNORMAL LOW (ref 12.0–15.0)
MCH: 27.7 pg (ref 26.0–34.0)
MCHC: 32.1 g/dL (ref 30.0–36.0)
MCV: 86.4 fL (ref 80.0–100.0)
Platelets: 315 10*3/uL (ref 150–400)
RBC: 3.68 MIL/uL — ABNORMAL LOW (ref 3.87–5.11)
RDW: 16.9 % — ABNORMAL HIGH (ref 11.5–15.5)
WBC: 6.5 10*3/uL (ref 4.0–10.5)
nRBC: 0.3 % — ABNORMAL HIGH (ref 0.0–0.2)

## 2019-07-10 LAB — GLUCOSE, CAPILLARY
Glucose-Capillary: 73 mg/dL (ref 70–99)
Glucose-Capillary: 69 mg/dL — ABNORMAL LOW (ref 70–99)
Glucose-Capillary: 116 mg/dL — ABNORMAL HIGH (ref 70–99)
Glucose-Capillary: 170 mg/dL — ABNORMAL HIGH (ref 70–99)
Glucose-Capillary: 175 mg/dL — ABNORMAL HIGH (ref 70–99)
Glucose-Capillary: 193 mg/dL — ABNORMAL HIGH (ref 70–99)

## 2019-07-10 LAB — BASIC METABOLIC PANEL
Anion gap: 8 (ref 5–15)
BUN: 44 mg/dL — ABNORMAL HIGH (ref 8–23)
CO2: 24 mmol/L (ref 22–32)
Calcium: 8.3 mg/dL — ABNORMAL LOW (ref 8.9–10.3)
Chloride: 107 mmol/L (ref 98–111)
Creatinine, Ser: 1.55 mg/dL — ABNORMAL HIGH (ref 0.44–1.00)
GFR calc Af Amer: 37 mL/min — ABNORMAL LOW (ref >60)
GFR calc non Af Amer: 32 mL/min — ABNORMAL LOW (ref >60)
Glucose, Bld: 67 mg/dL — ABNORMAL LOW (ref 70–99)
Potassium: 3.8 mmol/L (ref 3.5–5.1)
Sodium: 139 mmol/L (ref 135–145)

## 2019-07-10 NOTE — Progress Notes (Signed)
PROGRESS NOTE    Cheryl [name]  YHC:[id number] DOB: 11/21/1940 DOA: 07/03/2019 PCP: Letta Median, MD   Brief Narrative:  HPI on 07/03/2019 by Dr. Ivor Costa Cheryl [name] is a 79 y.o. female with medical history significant of hypertension, hyperlipidemia, diabetes mellitus, COPD, GERD, depression, anxiety, lung cancer (s/p of radiation therapy), dementia, iron deficiency anemia, CKD-3, who presents with shortness of breath.  Patient has dementia, and is a poor historian, therefore, most of the history is obtained by discussing the case with ED physician, per EMS report, and with the nursing staff.  History is limited. Patient has been having shortness of breath for several days, which has been progressively worsening.  Patient is not on oxygen at home, but requiring 4 L oxygen with oxygen saturation 100% in ED. Patient has dry cough, but denies chest pain.  No nausea, vomiting, diarrhea, abdominal pain.  Denies symptoms of UTI.  Patient moves all extremities.  Interim history Admitted for resp failure/ COPD Exacerbation.  Found to have elevated troponin with atrial flutter RVR, cardiology consulted.  On 07/08/2019, patient developed a junctional rhythm with a sinus pause and became bradycardic and was given atropine.  She was then transferred to the ICU and placed on a dopamine drip given her hypotension.  PCCM was consulted Assessment & Plan   Acute hypoxic respiratory failure secondary to COPD exacerbation as well as atrial fibrillation, CHF -Continues to have some wheezing on examination -Continue to treat underlying conditions -Patient completed azithromycin and steroids  Acute systolic/diastolic heart failure -Echocardiogram: EF of 30 to 35%, global hypokinesis, LVH, LV diastolic parameters indeterminate. -Cardiology consulted and appreciated-recommended transitioning metoprolol to metoprolol succinate 75 mg twice daily -Patient has had some acute kidney injury as well as soft  blood pressures this admission-continue to monitor these for the next 24 hours -Cardiology also recommending Lasix 20 mg daily upon hospital discharge -Currently has a net even fluid balance.  Atrial flutter with RVR- complicated by sinus pause -Patient was placed on amiodarone drip as her rate was difficult to control with calcium channel or beta blockers -was transitioned to oral-amiodarone 400 mg twice daily, metoprolol 75 mg twice daily -Patient may need cardioversion within a few weeks versus a different strategy -Was on heparin drip and transitioned to Xarelto -On 07/08/2019, patient developed a 5-second sinus pause.  Cardiology was notified and gave patient a dose of atropine.  Metoprolol has now been held as well as amiodarone -Earlier today, she was still on dopamine drip however this is now been discontinued as patient is now converted to sinus rhythm. -Currently in sinus rhythm with PVCs  Hypotension -After patient was given a dose of atropine, she was noted to be hypotensive -patient transferred to ICU for dopamine drip -Now off dopamine drip, BP appears stable  Acute kidney injury on chronic kidney disease, stage IIIa -On admission, creatinine was 1.6, peaked to 1.9 (baseline of 1.1-1.2) -Lasix, lisinopril held -Creatinine up to 1.55 today -Continue to monitor BMP  Diabetes mellitus, type II -metformin held -Recent A1c 6.6  -Continue Lantus, insulin sliding scale and CBG monitoring  Obstructive sleep apnea -Continue CPAP  Essential hypertension -Cardizem, metoprolol-held -Lisinopril was held due to AKI however now that that has improved, losartan was started on 07/07/2019 however given her hypotension this is now been held  Hyperlipidemia -Continue statin  Dementia -Continue Aricept, Namenda  Obesity -BMI of 36 -Patient to follow-up with PCP for lifestyle modification  Deconditioning -PT recommending SNF -TOC made aware  DVT Prophylaxis  Xarelto  Code  Status: Full  Family Communication: None at bedside  Disposition Plan:  Status is: Inpatient  Remains inpatient appropriate because:Inpatient level of care appropriate due to severity of illness- being treated for AFlutter, CHF, hypotension.   Dispo: The patient is from: ALF              Anticipated d/c is to: SNF              Anticipated d/c date is: 2 days              Patient currently is not medically stable to d/c.   Consultants Cardiology PCCM  Procedures  Echocardiogram  Antibiotics   Anti-infectives (From admission, onward)   Start     Dose/Rate Route Frequency Ordered Stop   07/04/19 1000  azithromycin (ZITHROMAX) tablet 250 mg       "Followed by" Linked Group Details   250 mg Oral Daily 07/03/19 1837 07/07/19 0917   07/03/19 1845  azithromycin (ZITHROMAX) tablet 500 mg       "Followed by" Linked Group Details   500 mg Oral Daily 07/03/19 1837 07/03/19 1853      Subjective:   Cheryl [name] seen and examined today.  Patient with dementia and will repeat herself multiple times.  Currently has no complaints of chest pain or shortness of breath, abdominal pain, nausea or vomiting, dizziness or headache.    Objective:   Vitals:   07/10/19 0825 07/10/19 1004 07/10/19 1200 07/10/19 1210  BP:  93/81 (!) 88/72 (!) 122/50  Pulse:  (!) 56 64 65  Resp:  (!) 25 (!) 21 20  Temp:   98.4 F (36.9 C)   TempSrc:   Axillary   SpO2: 97% 95% 98% 98%  Weight:      Height:        Intake/Output Summary (Last 24 hours) at 07/10/2019 1327 Last data filed at 07/10/2019 1038 Gross per 24 hour  Intake 240 ml  Output 950 ml  Net -710 ml   Filed Weights   07/08/19 1600 07/09/19 0437 07/10/19 0500  Weight: 96.1 kg 98.5 kg 94.6 kg   Exam  General: Well developed, chronically ill-appearing, NAD  HEENT: NCAT,mucous membranes moist.   Cardiovascular: S1 S2 auscultated, tachycardic  Respiratory: Diminished breath sounds  Abdomen: Soft, nontender, nondistended, + bowel  sounds  Extremities: warm dry without cyanosis clubbing or edema  Neuro: AAOx 1 (self only), dementia-follows commands, moves all extremities  Psych: Appropriate  Data Reviewed: I have personally reviewed following labs and imaging studies  CBC: Recent Labs  Lab 07/05/19 0207 07/06/19 0027 07/07/19 0657 07/09/19 0438 07/10/19 0753  WBC 8.0 9.8 8.4 9.9 6.5  HGB 9.7* 9.6* 11.3* 10.8* 10.2*  HCT 32.3* 30.4* 37.2 33.9* 31.8*  MCV 88.5 85.4 88.4 85.6 86.4  PLT 351 350 400 379 737   Basic Metabolic Panel: Recent Labs  Lab 07/06/19 0027 07/07/19 0657 07/08/19 0625 07/09/19 0438 07/10/19 0537  NA 141 140 138 135 139  K 3.9 4.3 3.8 4.5 3.8  CL 107 103 102 101 107  CO2 25 27 27 26 24   GLUCOSE 208* 172* 116* 208* 67*  BUN 64* 53* 52* 60* 44*  CREATININE 1.81* 1.44* 1.49* 1.88* 1.55*  CALCIUM 8.5* 8.8* 8.5* 8.9 8.3*  MG  --   --  1.8  --   --    GFR: Estimated Creatinine Clearance: 33.9 mL/min (A) (by C-G formula based on SCr of 1.55 mg/dL (H)). Liver Function Tests:  No results for input(s): AST, ALT, ALKPHOS, BILITOT, PROT, ALBUMIN in the last 168 hours. No results for input(s): LIPASE, AMYLASE in the last 168 hours. No results for input(s): AMMONIA in the last 168 hours. Coagulation Profile: No results for input(s): INR, PROTIME in the last 168 hours. Cardiac Enzymes: No results for input(s): CKTOTAL, CKMB, CKMBINDEX, TROPONINI in the last 168 hours. BNP (last 3 results) No results for input(s): PROBNP in the last 8760 hours. HbA1C: No results for input(s): HGBA1C in the last 72 hours. CBG: Recent Labs  Lab 07/09/19 2355 07/10/19 0341 07/10/19 0725 07/10/19 0755 07/10/19 1108  GLUCAP 85 73 69* 116* 170*   Lipid Profile: No results for input(s): CHOL, HDL, LDLCALC, TRIG, CHOLHDL, LDLDIRECT in the last 72 hours. Thyroid Function Tests: No results for input(s): TSH, T4TOTAL, FREET4, T3FREE, THYROIDAB in the last 72 hours. Anemia Panel: No results for  input(s): VITAMINB12, FOLATE, FERRITIN, TIBC, IRON, RETICCTPCT in the last 72 hours. Urine analysis:    Component Value Date/Time   COLORURINE YELLOW (A) 05/09/2019 2335   APPEARANCEUR CLEAR (A) 05/09/2019 2335   APPEARANCEUR Hazy 07/26/2013 1013   LABSPEC 1.038 (H) 05/09/2019 2335   LABSPEC 1.029 07/26/2013 1013   PHURINE 5.0 05/09/2019 2335   GLUCOSEU 50 (A) 05/09/2019 2335   GLUCOSEU Negative 07/26/2013 1013   HGBUR NEGATIVE 05/09/2019 2335   BILIRUBINUR NEGATIVE 05/09/2019 2335   BILIRUBINUR Negative 07/26/2013 1013   KETONESUR 5 (A) 05/09/2019 2335   PROTEINUR 30 (A) 05/09/2019 2335   NITRITE NEGATIVE 05/09/2019 2335   LEUKOCYTESUR NEGATIVE 05/09/2019 2335   LEUKOCYTESUR Negative 07/26/2013 1013   Sepsis Labs: @LABRCNTIP (procalcitonin:4,lacticidven:4)  ) Recent Results (from the past 240 hour(s))  SARS Coronavirus 2 by RT PCR (hospital order, performed in [place] hospital lab) Nasopharyngeal Nasopharyngeal Swab     Status: None   Collection Time: 07/03/19  2:19 PM   Specimen: Nasopharyngeal Swab  Result Value Ref Range Status   SARS Coronavirus 2 NEGATIVE NEGATIVE Final    Comment: (NOTE) SARS-CoV-2 target nucleic acids are NOT DETECTED.  The SARS-CoV-2 RNA is generally detectable in upper and lower respiratory specimens during the acute phase of infection. The lowest concentration of SARS-CoV-2 viral copies this assay can detect is 250 copies / mL. A negative result does not preclude SARS-CoV-2 infection and should not be used as the sole basis for treatment or other patient management decisions.  A negative result may occur with improper specimen collection / handling, submission of specimen other than nasopharyngeal swab, presence of viral mutation(s) within the areas targeted by this assay, and inadequate number of viral copies (<250 copies / mL). A negative result must be combined with clinical observations, patient history, and epidemiological  information.  Fact Sheet for Patients:   StrictlyIdeas.no  Fact Sheet for Healthcare Providers: BankingDealers.co.za  This test is not yet approved or  cleared by the [place] FDA and has been authorized for detection and/or diagnosis of SARS-CoV-2 by FDA under an Emergency Use Authorization (EUA).  This EUA will remain in effect (meaning this test can be used) for the duration of the COVID-19 declaration under Section 564(b)(1) of the Act, 21 U.S.C. section 360bbb-3(b)(1), unless the authorization is terminated or revoked sooner.  Performed at [facility], [address] [place], [place] [address]   MRSA PCR Screening     Status: None   Collection Time: 07/04/19 12:20 PM   Specimen: Nasopharyngeal  Result Value Ref Range Status   MRSA by PCR NEGATIVE NEGATIVE  Final    Comment:        The GeneXpert MRSA Assay (FDA approved for NASAL specimens only), is one component of a comprehensive MRSA colonization surveillance program. It is not intended to diagnose MRSA infection nor to guide or monitor treatment for MRSA infections. Performed at [facility], [address] [place], [place] [address]   MRSA PCR Screening     Status: None   Collection Time: 07/08/19  4:00 PM   Specimen: Nasal Mucosa; Nasopharyngeal  Result Value Ref Range Status   MRSA by PCR NEGATIVE NEGATIVE Final    Comment:        The GeneXpert MRSA Assay (FDA approved for NASAL specimens only), is one component of a comprehensive MRSA colonization surveillance program. It is not intended to diagnose MRSA infection nor to guide or monitor treatment for MRSA infections. Performed at [facility], [address], [place], [place] [address]       Radiology Studies: [place] EKG SITE RITE  Result Date: 07/08/2019 If [facility] image not attached, placement could not be confirmed due to current cardiac rhythm.    Scheduled  Meds: . ALPRAZolam  0.25 mg Oral BID  . atorvastatin  20 mg Oral QHS  . Chlorhexidine Gluconate Cloth  6 each Topical Daily  . dextromethorphan-guaiFENesin  1 tablet Oral BID  . memantine  28 mg Oral Daily   And  . donepezil  10 mg Oral QHS  . ferrous sulfate  325 mg Oral BID WC  . fluticasone  2 spray Each Nare Daily  . insulin aspart  0-9 Units Subcutaneous Q4H  . insulin glargine  7 Units Subcutaneous Daily  . levalbuterol  0.63 mg Nebulization Q8H  . midodrine  10 mg Oral TID WC  . modafinil  100 mg Oral Daily  . mometasone-formoterol  2 puff Inhalation BID  . pantoprazole  40 mg Oral Daily  . perphenazine  4 mg Oral TID  . QUEtiapine  200 mg Oral QHS  . rivaroxaban  15 mg Oral Q supper  . sodium chloride flush  10-40 mL Intracatheter Q12H   Continuous Infusions:   LOS: 7 days   Time Spent in minutes   30 minutes  Anjuli Gemmill D.O. on 07/10/2019 at 1:27 PM  Between 7am to 7pm - Please see pager noted on [email or web address]  After 7pm go to [email or web address]  And look for the night coverage person covering for me after hours  [facility] Office  [phone]

## 2019-07-10 NOTE — Progress Notes (Signed)
Gave report to 2A RN. Tried to call son to give him update but unable to reach him.

## 2019-07-10 NOTE — Progress Notes (Signed)
Pt converted to a-fib/ST rate of 105-107 with frequent PVCs. Dr. Ree Kida and Cardiology made aware. Pt is asymptomatic. BP stable. Per MD we will continue to monitor.

## 2019-07-10 NOTE — Progress Notes (Signed)
Pt alert and oriented to self, at baseline. Denies pain. Appears to be in a good mood. Symmetrical in all extremities. NSR to SB on monitor. VSS. Will continue to monitor.

## 2019-07-10 NOTE — Progress Notes (Signed)
    No active cardiology issues Pt has severe dementia Cont current meds     Mertie Moores, MD  07/10/2019 11:57 AM    [facility] [place],  [place], [place]  [address] Phone: [phone]; Fax: [phone]

## 2019-07-11 LAB — BASIC METABOLIC PANEL
Anion gap: 8 (ref 5–15)
BUN: 30 mg/dL — ABNORMAL HIGH (ref 8–23)
CO2: 28 mmol/L (ref 22–32)
Calcium: 8.8 mg/dL — ABNORMAL LOW (ref 8.9–10.3)
Chloride: 104 mmol/L (ref 98–111)
Creatinine, Ser: 1.34 mg/dL — ABNORMAL HIGH (ref 0.44–1.00)
GFR calc Af Amer: 44 mL/min — ABNORMAL LOW (ref >60)
GFR calc non Af Amer: 38 mL/min — ABNORMAL LOW (ref >60)
Glucose, Bld: 99 mg/dL (ref 70–99)
Potassium: 4.3 mmol/L (ref 3.5–5.1)
Sodium: 140 mmol/L (ref 135–145)

## 2019-07-11 LAB — GLUCOSE, CAPILLARY
Glucose-Capillary: 106 mg/dL — ABNORMAL HIGH (ref 70–99)
Glucose-Capillary: 108 mg/dL — ABNORMAL HIGH (ref 70–99)
Glucose-Capillary: 115 mg/dL — ABNORMAL HIGH (ref 70–99)
Glucose-Capillary: 154 mg/dL — ABNORMAL HIGH (ref 70–99)
Glucose-Capillary: 91 mg/dL (ref 70–99)
Glucose-Capillary: 95 mg/dL (ref 70–99)

## 2019-07-11 MED ORDER — INSULIN ASPART 100 UNIT/ML ~~LOC~~ SOLN
0.0000 [IU] | Freq: Three times a day (TID) | SUBCUTANEOUS | Status: DC
  Administered 2019-07-12: 3 [IU] via SUBCUTANEOUS
  Administered 2019-07-13: 1 [IU] via SUBCUTANEOUS
  Administered 2019-07-14: 2 [IU] via SUBCUTANEOUS
  Filled 2019-07-11 (×3): qty 1

## 2019-07-11 MED ORDER — METOPROLOL TARTRATE 25 MG PO TABS
12.5000 mg | ORAL_TABLET | Freq: Two times a day (BID) | ORAL | Status: DC
  Administered 2019-07-11: 12.5 mg via ORAL
  Filled 2019-07-11: qty 1

## 2019-07-11 MED ORDER — METOPROLOL TARTRATE 25 MG PO TABS
25.0000 mg | ORAL_TABLET | Freq: Four times a day (QID) | ORAL | Status: DC
  Administered 2019-07-11 – 2019-07-14 (×11): 25 mg via ORAL
  Filled 2019-07-11 (×10): qty 1

## 2019-07-11 NOTE — Progress Notes (Signed)
   07/11/19 0826  Assess: MEWS Score  Temp (!) 97.3 F (36.3 C)  BP (!) 148/91  Pulse Rate (!) 118  Resp 18  SpO2 100 %  O2 Device Room Air  Assess: MEWS Score  MEWS Temp 0  MEWS Systolic 0  MEWS Pulse 2  MEWS RR 0  MEWS LOC 0  MEWS Score 2  MEWS Score Color Yellow  Assess: if the MEWS score is Yellow or Red  Were vital signs taken at a resting state? Yes  Focused Assessment Documented focused assessment  Early Detection of Sepsis Score *See Row Information* Medium  MEWS guidelines implemented *See Row Information* Yes  Treat  MEWS Interventions Administered scheduled meds/treatments  Take Vital Signs  Increase Vital Sign Frequency  Yellow: Q 2hr X 2 then Q 4hr X 2, if remains yellow, continue Q 4hrs  Escalate  MEWS: Escalate Yellow: discuss with charge nurse/RN and consider discussing with provider and RRT  Notify: Charge Nurse/RN  Name of Charge Nurse/RN Notified Ria Comment, RN  Date Charge Nurse/RN Notified 07/11/19  Time Charge Nurse/RN Notified 0981  Notify: Provider  Provider Name/Title Dr. Ree Kida  Date Provider Notified 07/11/19  Time Provider Notified [phone]  Notification Type Face-to-face  Response No new orders  Date of Provider Response 07/11/19  Time of Provider Response 0830

## 2019-07-11 NOTE — Progress Notes (Signed)
Progress Note  Patient Name: Cheryl [name] Date of Encounter: 07/11/2019  Primary Cardiologist: New to [facility] - consult by Fletcher Anon  Subjective   Transferred back out of the ICU. Still, no one has been able to get in contact with the patient's legal guardian, her son. She is back in atrial flutter with RVR with BP in the 1-teens systolic. No further history able to be obtained from the patient secondary to dementia.   Inpatient Medications    Scheduled Meds: . ALPRAZolam  0.25 mg Oral BID  . atorvastatin  20 mg Oral QHS  . Chlorhexidine Gluconate Cloth  6 each Topical Daily  . dextromethorphan-guaiFENesin  1 tablet Oral BID  . memantine  28 mg Oral Daily   And  . donepezil  10 mg Oral QHS  . ferrous sulfate  325 mg Oral BID WC  . fluticasone  2 spray Each Nare Daily  . insulin aspart  0-9 Units Subcutaneous Q4H  . insulin glargine  7 Units Subcutaneous Daily  . levalbuterol  0.63 mg Nebulization Q8H  . midodrine  10 mg Oral TID WC  . modafinil  100 mg Oral Daily  . mometasone-formoterol  2 puff Inhalation BID  . pantoprazole  40 mg Oral Daily  . perphenazine  4 mg Oral TID  . QUEtiapine  200 mg Oral QHS  . rivaroxaban  15 mg Oral Q supper  . sodium chloride flush  10-40 mL Intracatheter Q12H   Continuous Infusions:  PRN Meds: acetaminophen, albuterol, alum & mag hydroxide-simeth, hydrALAZINE, magnesium hydroxide, ondansetron (ZOFRAN) IV, sodium chloride flush   Vital Signs    Vitals:   07/11/19 0500 07/11/19 0502 07/11/19 0817 07/11/19 0826  BP:  (!) 149/86  (!) 148/91  Pulse:  (!) 116  (!) 118  Resp:    18  Temp:  97.7 F (36.5 C)  (!) 97.3 F (36.3 C)  TempSrc:  Oral    SpO2:  100% 97% 100%  Weight: 97 kg     Height:        Intake/Output Summary (Last 24 hours) at 07/11/2019 0921 Last data filed at 07/11/2019 0914 Gross per 24 hour  Intake 10 ml  Output 1650 ml  Net -1640 ml   Filed Weights   07/10/19 2039 07/11/19 0010 07/11/19 0500  Weight: 97.4 kg  97 kg 97 kg    Telemetry    Atrial flutter with RVR, 1-teens bpm with occasional isolated PVCs - Personally Reviewed  ECG    No new tracings - Personally Reviewed  Physical Exam   GEN: No acute distress.   Neck: No JVD. Cardiac: Tachycardic, no murmurs, rubs, or gallops.  Respiratory: Clear to auscultation bilaterally.  GI: Soft, nontender, non-distended.   MS: No edema; No deformity. Neuro:  Alert and not oriented; Nonfocal.  Psych: Unable to assess secondary to dementia.  Labs    Chemistry Recent Labs  Lab 07/09/19 0438 07/10/19 0537 07/11/19 0705  NA 135 139 140  K 4.5 3.8 4.3  CL 101 107 104  CO2 26 24 28   GLUCOSE 208* 67* 99  BUN 60* 44* 30*  CREATININE 1.88* 1.55* 1.34*  CALCIUM 8.9 8.3* 8.8*  GFRNONAA 25* 32* 38*  GFRAA 29* 37* 44*  ANIONGAP 8 8 8      Hematology Recent Labs  Lab 07/07/19 0657 07/09/19 0438 07/10/19 0753  WBC 8.4 9.9 6.5  RBC 4.21 3.96 3.68*  HGB 11.3* 10.8* 10.2*  HCT 37.2 33.9* 31.8*  MCV 88.4 85.6  86.4  MCH 26.8 27.3 27.7  MCHC 30.4 31.9 32.1  RDW 16.6* 16.4* 16.9*  PLT 400 379 315    Cardiac EnzymesNo results for input(s): TROPONINI in the last 168 hours. No results for input(s): TROPIPOC in the last 168 hours.   BNPNo results for input(s): BNP, PROBNP in the last 168 hours.   DDimer No results for input(s): DDIMER in the last 168 hours.   Radiology    No results found.  Cardiac Studies   2D echo 07/04/2019: 1. Left ventricular ejection fraction, by estimation, is 30 to 35%. The  left ventricle has moderately decreased function. The left ventricle  demonstrates global hypokinesis. There is mild left ventricular  hypertrophy. Left ventricular diastolic  parameters are indeterminate.  2. Right ventricular systolic function is mildly reduced. The right  ventricular size is mildly enlarged. There is mildly elevated pulmonary  artery systolic pressure.  3. Left atrial size was mildly dilated.  4. Right atrial  size was moderately dilated.  5. The mitral valve is normal in structure. Trivial mitral valve  regurgitation. No evidence of mitral stenosis.  6. Tricuspid valve regurgitation is moderate.  7. The aortic valve is normal in structure. Aortic valve regurgitation is  trivial. Mild aortic valve sclerosis is present, with no evidence of  aortic valve stenosis.  8. Moderately dilated pulmonary artery.  Patient Profile     79 y.o. female with history of DM2, HTN, HLD, cognitive impairment/dementia, COPD, CKD stage III, lung cancer s/p radiation therapy, iron deficiency anemia, depression, anxiety, and GERDwho is being seen today for the evaluation of acute systolic CHF and atrial flutter with RVR with subsequent junctional rhythm and hypotension.   Assessment & Plan    1. Afib/flutter with RVR: -Admitted with new onset atrial flutter with RVR of uncertain chronicity  -Treatment options were limited at that time, and thus she was placed on IV amiodarone, despite CVA risk -She subsequently converted with an ~ 5 second post termination pause on 6/25 followed by persistent junctional rhythm with associated hypotension requiring atropine followed by dopamine infusion in the ICU -Following discontinuation of dopamine, she has been transferred back to progressive unit and is now back in atrial flutter with RVR -She is asymptomatic -BP is improved in the 1-teens bpm -Add low dose metoprolol 12.5 mg bid -Will reach out to EP to see if they can consult on her tomorrow for tachy-brady syndrome, if not, we will need to consult them virtually  -CHADS2VASc at least 6 (CHF, HTN, age x 2, DM2, gender) -Xarelto 15 mg -Estimated Creatinine Clearance: 39.7 mL/min (A) (by C-G formula based on SCr of 1.34 mg/dL (H))  2. Junctional rhythm/hypotension: -As above, currently back in atrial flutter with RVR  3. HFrEF: -Uncertain chronicity  -She denies any dyspnea  -She appears grossly euvolemic and well  compensated  -Add metoprolol as above  4. Elevated HS-Tn: -Denies chest pain -Mildly elevated and flat trending -Not consistent with ACS -No plans for inpatient ischemic evaluation in the setting of advanced dementia  -No ASA with Xarleto  5. Acute on CKD stage III: -Renal function continues to improve   6. Dementia: -Despite daily attempts, no one has been able to get in touch with the patient's legal guardian, Richardson Landry, her son -We will attempt to contact him again today   For questions or updates, please contact [place] Please consult [email or web address] for contact info under Cardiology/STEMI.    Signed, Christell Faith, PA-C [place] Pager: [phone] 07/11/2019,  9:21 AM

## 2019-07-11 NOTE — Progress Notes (Signed)
PROGRESS NOTE    Cheryl [name]  SKA:[id number] DOB: August 15, 1940 DOA: 07/03/2019 PCP: Letta Median, MD   Brief Narrative:  HPI on 07/03/2019 by Dr. Ivor Costa Cheryl [name] is a 79 y.o. female with medical history significant of hypertension, hyperlipidemia, diabetes mellitus, COPD, GERD, depression, anxiety, lung cancer (s/p of radiation therapy), dementia, iron deficiency anemia, CKD-3, who presents with shortness of breath.  Patient has dementia, and is a poor historian, therefore, most of the history is obtained by discussing the case with ED physician, per EMS report, and with the nursing staff.  History is limited. Patient has been having shortness of breath for several days, which has been progressively worsening.  Patient is not on oxygen at home, but requiring 4 L oxygen with oxygen saturation 100% in ED. Patient has dry cough, but denies chest pain.  No nausea, vomiting, diarrhea, abdominal pain.  Denies symptoms of UTI.  Patient moves all extremities.  Interim history Admitted for resp failure/ COPD Exacerbation.  Found to have elevated troponin with atrial flutter RVR, cardiology consulted.  On 07/08/2019, patient developed a junctional rhythm with a sinus pause and became bradycardic and was given atropine.  She was then transferred to the ICU and placed on a dopamine drip given her hypotension.  PCCM was consulted Assessment & Plan   Acute hypoxic respiratory failure secondary to COPD exacerbation as well as atrial fibrillation, CHF -Continues to have some wheezing on examination -Continue to treat underlying conditions -Patient completed azithromycin and steroids  Acute systolic/diastolic heart failure -Echocardiogram: EF of 30 to 35%, global hypokinesis, LVH, LV diastolic parameters indeterminate. -Cardiology consulted and appreciated-recommended transitioning metoprolol to metoprolol succinate 75 mg twice daily -Patient has had some acute kidney injury as well as soft  blood pressures this admission-continue to monitor these for the next 24 hours -Cardiology also recommending Lasix 20 mg daily upon hospital discharge -Currently has a net even fluid balance. -ACE/ARB have been held at this time due to recent hypotension  Paroxysmal atrial flutter with RVR- complicated by sinus pause/ ? Tachy-bradycardia syndrome -Patient was placed on amiodarone drip as her rate was difficult to control with calcium channel or beta blockers -was transitioned to oral-amiodarone 400 mg twice daily, metoprolol 75 mg twice daily -Patient may need cardioversion within a few weeks versus a different strategy -Was on heparin drip and transitioned to Xarelto -On 07/08/2019, patient developed a 5-second sinus pause.  Cardiology was notified and gave patient a dose of atropine.  Toprol and amiodarone were held, and patient was placed on a dopamine drip which has since been discontinued. -Since then, patient has become tachycardic and cardiology has started her on metoprolol and will consult EP  Hypotension -After patient was given a dose of atropine, she was noted to be hypotensive -patient transferred to ICU for dopamine drip -Now off dopamine drip, BP appears stable  Acute kidney injury on chronic kidney disease, stage IIIa -On admission, creatinine was 1.6, peaked to 1.9 (baseline of 1.1-1.2) -Lasix, lisinopril held -Creatinine up to 1.34 today -Continue to monitor BMP  Diabetes mellitus, type II -metformin held -Recent A1c 6.6  -Continue Lantus, insulin sliding scale and CBG monitoring  Obstructive sleep apnea -Continue CPAP  Essential hypertension -Cardizem, metoprolol-held -Lisinopril was held due to AKI however now that that has improved, losartan was started on 07/07/2019 however given her hypotension this is now been held  Hyperlipidemia -Continue statin  Dementia -Continue Aricept, Namenda  Obesity -BMI of 36 -Patient to follow-up with PCP for  lifestyle  modification  Deconditioning -PT recommending SNF -TOC made aware  DVT Prophylaxis  Xarelto  Code Status: Full  Family Communication: None at bedside  Disposition Plan:  Status is: Inpatient  Remains inpatient appropriate because:Inpatient level of care appropriate due to severity of illness- being treated for AFlutter, CHF, hypotension.   Dispo: The patient is from: ALF              Anticipated d/c is to: SNF              Anticipated d/c date is: 2 days              Patient currently is not medically stable to d/c.   Consultants Cardiology PCCM  Procedures  Echocardiogram  Antibiotics   Anti-infectives (From admission, onward)   Start     Dose/Rate Route Frequency Ordered Stop   07/04/19 1000  azithromycin (ZITHROMAX) tablet 250 mg       "Followed by" Linked Group Details   250 mg Oral Daily 07/03/19 1837 07/07/19 0917   07/03/19 1845  azithromycin (ZITHROMAX) tablet 500 mg       "Followed by" Linked Group Details   500 mg Oral Daily 07/03/19 1837 07/03/19 1853      Subjective:   Cheryl [name] seen and examined today.  Patient with dementia and will repeat herself multiple times.  No complaints of chest pain or shortness of breath. Objective:   Vitals:   07/11/19 0826 07/11/19 1010 07/11/19 1205 07/11/19 1418  BP: (!) 148/91 138/73 140/90 139/87  Pulse: (!) 118 (!) 118 (!) 115 (!) 116  Resp: 18 20 20 18   Temp: (!) 97.3 F (36.3 C) 97.9 F (36.6 C) (!) 97.3 F (36.3 C) 98.1 F (36.7 C)  TempSrc:  Oral  Oral  SpO2: 100% 96% 98% 98%  Weight:      Height:        Intake/Output Summary (Last 24 hours) at 07/11/2019 1507 Last data filed at 07/11/2019 1330 Gross per 24 hour  Intake 130 ml  Output 2200 ml  Net -2070 ml   Filed Weights   07/10/19 2039 07/11/19 0010 07/11/19 0500  Weight: 97.4 kg 97 kg 97 kg   Exam  General: Well developed, chronically ill-appearing, NAD  HEENT: NCAT,  mucous membranes moist.   Cardiovascular: S1 S2 auscultated,  tachycardic   Respiratory: Diminished breath sounds  Abdomen: Soft, nontender, nondistended, + bowel sounds  Extremities: warm dry without cyanosis clubbing or edema  Neuro: AAOx 1 (self only),  Dementia, follows commands moves all extremities  Psych: Appropriate mood and affect   Data Reviewed: I have personally reviewed following labs and imaging studies  CBC: Recent Labs  Lab 07/05/19 0207 07/06/19 0027 07/07/19 0657 07/09/19 0438 07/10/19 0753  WBC 8.0 9.8 8.4 9.9 6.5  HGB 9.7* 9.6* 11.3* 10.8* 10.2*  HCT 32.3* 30.4* 37.2 33.9* 31.8*  MCV 88.5 85.4 88.4 85.6 86.4  PLT 351 350 400 379 355   Basic Metabolic Panel: Recent Labs  Lab 07/07/19 0657 07/08/19 0625 07/09/19 0438 07/10/19 0537 07/11/19 0705  NA 140 138 135 139 140  K 4.3 3.8 4.5 3.8 4.3  CL 103 102 101 107 104  CO2 27 27 26 24 28   GLUCOSE 172* 116* 208* 67* 99  BUN 53* 52* 60* 44* 30*  CREATININE 1.44* 1.49* 1.88* 1.55* 1.34*  CALCIUM 8.8* 8.5* 8.9 8.3* 8.8*  MG  --  1.8  --   --   --  GFR: Estimated Creatinine Clearance: 39.7 mL/min (A) (by C-G formula based on SCr of 1.34 mg/dL (H)). Liver Function Tests: No results for input(s): AST, ALT, ALKPHOS, BILITOT, PROT, ALBUMIN in the last 168 hours. No results for input(s): LIPASE, AMYLASE in the last 168 hours. No results for input(s): AMMONIA in the last 168 hours. Coagulation Profile: No results for input(s): INR, PROTIME in the last 168 hours. Cardiac Enzymes: No results for input(s): CKTOTAL, CKMB, CKMBINDEX, TROPONINI in the last 168 hours. BNP (last 3 results) No results for input(s): PROBNP in the last 8760 hours. HbA1C: No results for input(s): HGBA1C in the last 72 hours. CBG: Recent Labs  Lab 07/10/19 1941 07/11/19 0011 07/11/19 0343 07/11/19 0825 07/11/19 1159  GLUCAP 193* 106* 95 91 108*   Lipid Profile: No results for input(s): CHOL, HDL, LDLCALC, TRIG, CHOLHDL, LDLDIRECT in the last 72 hours. Thyroid Function Tests: No  results for input(s): TSH, T4TOTAL, FREET4, T3FREE, THYROIDAB in the last 72 hours. Anemia Panel: No results for input(s): VITAMINB12, FOLATE, FERRITIN, TIBC, IRON, RETICCTPCT in the last 72 hours. Urine analysis:    Component Value Date/Time   COLORURINE YELLOW (A) 05/09/2019 2335   APPEARANCEUR CLEAR (A) 05/09/2019 2335   APPEARANCEUR Hazy 07/26/2013 1013   LABSPEC 1.038 (H) 05/09/2019 2335   LABSPEC 1.029 07/26/2013 1013   PHURINE 5.0 05/09/2019 2335   GLUCOSEU 50 (A) 05/09/2019 2335   GLUCOSEU Negative 07/26/2013 1013   HGBUR NEGATIVE 05/09/2019 2335   BILIRUBINUR NEGATIVE 05/09/2019 2335   BILIRUBINUR Negative 07/26/2013 1013   KETONESUR 5 (A) 05/09/2019 2335   PROTEINUR 30 (A) 05/09/2019 2335   NITRITE NEGATIVE 05/09/2019 2335   LEUKOCYTESUR NEGATIVE 05/09/2019 2335   LEUKOCYTESUR Negative 07/26/2013 1013   Sepsis Labs: @LABRCNTIP (procalcitonin:4,lacticidven:4)  ) Recent Results (from the past 240 hour(s))  SARS Coronavirus 2 by RT PCR (hospital order, performed in [place] hospital lab) Nasopharyngeal Nasopharyngeal Swab     Status: None   Collection Time: 07/03/19  2:19 PM   Specimen: Nasopharyngeal Swab  Result Value Ref Range Status   SARS Coronavirus 2 NEGATIVE NEGATIVE Final    Comment: (NOTE) SARS-CoV-2 target nucleic acids are NOT DETECTED.  The SARS-CoV-2 RNA is generally detectable in upper and lower respiratory specimens during the acute phase of infection. The lowest concentration of SARS-CoV-2 viral copies this assay can detect is 250 copies / mL. A negative result does not preclude SARS-CoV-2 infection and should not be used as the sole basis for treatment or other patient management decisions.  A negative result may occur with improper specimen collection / handling, submission of specimen other than nasopharyngeal swab, presence of viral mutation(s) within the areas targeted by this assay, and inadequate number of viral copies (<250 copies / mL).  A negative result must be combined with clinical observations, patient history, and epidemiological information.  Fact Sheet for Patients:   StrictlyIdeas.no  Fact Sheet for Healthcare Providers: BankingDealers.co.za  This test is not yet approved or  cleared by the [place] FDA and has been authorized for detection and/or diagnosis of SARS-CoV-2 by FDA under an Emergency Use Authorization (EUA).  This EUA will remain in effect (meaning this test can be used) for the duration of the COVID-19 declaration under Section 564(b)(1) of the Act, 21 U.S.C. section 360bbb-3(b)(1), unless the authorization is terminated or revoked sooner.  Performed at [facility], [address], [place], [place] [address]   MRSA PCR Screening     Status: None   Collection Time:  07/04/19 12:20 PM   Specimen: Nasopharyngeal  Result Value Ref Range Status   MRSA by PCR NEGATIVE NEGATIVE Final    Comment:        The GeneXpert MRSA Assay (FDA approved for NASAL specimens only), is one component of a comprehensive MRSA colonization surveillance program. It is not intended to diagnose MRSA infection nor to guide or monitor treatment for MRSA infections. Performed at [facility], [address] [place], [place] [address]   MRSA PCR Screening     Status: None   Collection Time: 07/08/19  4:00 PM   Specimen: Nasal Mucosa; Nasopharyngeal  Result Value Ref Range Status   MRSA by PCR NEGATIVE NEGATIVE Final    Comment:        The GeneXpert MRSA Assay (FDA approved for NASAL specimens only), is one component of a comprehensive MRSA colonization surveillance program. It is not intended to diagnose MRSA infection nor to guide or monitor treatment for MRSA infections. Performed at [facility], [address] [place], [place] [address]       Radiology Studies: No results found.   Scheduled Meds: . ALPRAZolam   0.25 mg Oral BID  . atorvastatin  20 mg Oral QHS  . Chlorhexidine Gluconate Cloth  6 each Topical Daily  . dextromethorphan-guaiFENesin  1 tablet Oral BID  . memantine  28 mg Oral Daily   And  . donepezil  10 mg Oral QHS  . ferrous sulfate  325 mg Oral BID WC  . fluticasone  2 spray Each Nare Daily  . insulin aspart  0-9 Units Subcutaneous Q4H  . insulin glargine  7 Units Subcutaneous Daily  . levalbuterol  0.63 mg Nebulization Q8H  . metoprolol tartrate  25 mg Oral Q6H  . midodrine  10 mg Oral TID WC  . modafinil  100 mg Oral Daily  . mometasone-formoterol  2 puff Inhalation BID  . pantoprazole  40 mg Oral Daily  . perphenazine  4 mg Oral TID  . QUEtiapine  200 mg Oral QHS  . rivaroxaban  15 mg Oral Q supper  . sodium chloride flush  10-40 mL Intracatheter Q12H   Continuous Infusions:   LOS: 8 days   Time Spent in minutes   30 minutes  Cheryl [name] D.O. on 07/11/2019 at 3:07 PM  Between 7am to 7pm - Please see pager noted on [email or web address]  After 7pm go to [email or web address]  And look for the night coverage person covering for me after hours  [facility] Office  [phone]

## 2019-07-11 NOTE — Care Management Important Message (Signed)
Important Message  Patient Details  Name: Cheryl [name] MRN: [id number] Date of Birth: 1940-03-22   Medicare Important Message Given:  Yes     [name] 07/11/2019, 11:52 AM

## 2019-07-12 DIAGNOSIS — I4892 Unspecified atrial flutter: Secondary | ICD-10-CM

## 2019-07-12 DIAGNOSIS — I495 Sick sinus syndrome: Secondary | ICD-10-CM

## 2019-07-12 LAB — GLUCOSE, CAPILLARY
Glucose-Capillary: 100 mg/dL — ABNORMAL HIGH (ref 70–99)
Glucose-Capillary: 205 mg/dL — ABNORMAL HIGH (ref 70–99)
Glucose-Capillary: 116 mg/dL — ABNORMAL HIGH (ref 70–99)
Glucose-Capillary: 144 mg/dL — ABNORMAL HIGH (ref 70–99)

## 2019-07-12 LAB — BASIC METABOLIC PANEL
Anion gap: 7 (ref 5–15)
BUN: 23 mg/dL (ref 8–23)
CO2: 31 mmol/L (ref 22–32)
Calcium: 8.7 mg/dL — ABNORMAL LOW (ref 8.9–10.3)
Chloride: 104 mmol/L (ref 98–111)
Creatinine, Ser: 1.31 mg/dL — ABNORMAL HIGH (ref 0.44–1.00)
GFR calc Af Amer: 45 mL/min — ABNORMAL LOW (ref >60)
GFR calc non Af Amer: 39 mL/min — ABNORMAL LOW (ref >60)
Glucose, Bld: 95 mg/dL (ref 70–99)
Potassium: 4.3 mmol/L (ref 3.5–5.1)
Sodium: 142 mmol/L (ref 135–145)

## 2019-07-12 NOTE — Progress Notes (Signed)
PROGRESS NOTE    Cheryl [name]  HGD:[id number] DOB: January 05, 1941 DOA: 07/03/2019 PCP: Letta Median, MD   Brief Narrative:  HPI on 07/03/2019 by Dr. Ivor Costa Cheryl [name] is a 79 y.o. female with medical history significant of hypertension, hyperlipidemia, diabetes mellitus, COPD, GERD, depression, anxiety, lung cancer (s/p of radiation therapy), dementia, iron deficiency anemia, CKD-3, who presents with shortness of breath.  Patient has dementia, and is a poor historian, therefore, most of the history is obtained by discussing the case with ED physician, per EMS report, and with the nursing staff.  History is limited. Patient has been having shortness of breath for several days, which has been progressively worsening.  Patient is not on oxygen at home, but requiring 4 L oxygen with oxygen saturation 100% in ED. Patient has dry cough, but denies chest pain.  No nausea, vomiting, diarrhea, abdominal pain.  Denies symptoms of UTI.  Patient moves all extremities.  Interim history Admitted for resp failure/ COPD Exacerbation.  Found to have elevated troponin with atrial flutter RVR, cardiology consulted.  On 07/08/2019, patient developed a junctional rhythm with a sinus pause and became bradycardic and was given atropine.  She was then transferred to the ICU and placed on a dopamine drip given her hypotension.  PCCM was consulted.  Dopamine drip has been discontinued.  Patient was transferred back to progressive.  Patient did become tachycardic.  Cardiology would like for EP to evaluate patient, which will be done on 07/14/2019. Assessment & Plan   Acute hypoxic respiratory failure secondary to COPD exacerbation as well as atrial fibrillation, CHF -Continues to have some wheezing on examination -Continue to treat underlying conditions -Patient completed azithromycin and steroids  Acute systolic/diastolic heart failure -Echocardiogram: EF of 30 to 35%, global hypokinesis, LVH, LV diastolic  parameters indeterminate. -Cardiology consulted and appreciated-recommended transitioning metoprolol to metoprolol succinate 75 mg twice daily -Patient has had some acute kidney injury as well as soft blood pressures this admission-continue to monitor these for the next 24 hours -Cardiology also recommending Lasix 20 mg daily upon hospital discharge -Currently has a net even fluid balance. -ACE/ARB have been held at this time due to recent hypotension  Paroxysmal atrial flutter with RVR- complicated by sinus pause/ ? Tachy-bradycardia syndrome -Patient was placed on amiodarone drip as her rate was difficult to control with calcium channel or beta blockers -was transitioned to oral-amiodarone 400 mg twice daily, metoprolol 75 mg twice daily -Patient may need cardioversion within a few weeks versus a different strategy -Was on heparin drip and transitioned to Xarelto -On 07/08/2019, patient developed a 5-second sinus pause.  Cardiology was notified and gave patient a dose of atropine.  Toprol and amiodarone were held, and patient was placed on a dopamine drip which has since been discontinued. -Since then, patient has become tachycardic and cardiology has started her on metoprolol and will consult EP- likely on 7/1  Hypotension -After patient was given a dose of atropine, she was noted to be hypotensive -patient transferred to ICU for dopamine drip -Has been off of dopamine drip, BP appears stable -Patient started on midodrine  Acute kidney injury on chronic kidney disease, stage IIIa -On admission, creatinine was 1.6, peaked to 1.9 (baseline of 1.1-1.2) -Lasix, lisinopril held -Creatinine up to 1.31 today -Continue to monitor BMP  Diabetes mellitus, type II -metformin held -Recent A1c 6.6  -Continue Lantus, insulin sliding scale and CBG monitoring  Obstructive sleep apnea -Continue CPAP  Essential hypertension -Cardizem held -Lisinopril was held due  to AKI however now that that has  improved, losartan was started on 07/07/2019 however given her hypotension this is now been held  Hyperlipidemia -Continue statin  Dementia -Continue Aricept, Namenda  Obesity -BMI of 36 -Patient to follow-up with PCP for lifestyle modification  Deconditioning -PT recommending SNF -TOC made aware  DVT Prophylaxis  Xarelto  Code Status: Full  Family Communication: None at bedside  Disposition Plan:  Status is: Inpatient  Remains inpatient appropriate because:Inpatient level of care appropriate due to severity of illness- being treated for AFlutter, CHF, hypotension.  Pending EP evaluation on 07/14/2019   Dispo: The patient is from: ALF              Anticipated d/c is to: SNF              Anticipated d/c date is: 3 days              Patient currently is not medically stable to d/c.   Consultants Cardiology PCCM  Procedures  Echocardiogram  Antibiotics   Anti-infectives (From admission, onward)   Start     Dose/Rate Route Frequency Ordered Stop   07/04/19 1000  azithromycin (ZITHROMAX) tablet 250 mg       "Followed by" Linked Group Details   250 mg Oral Daily 07/03/19 1837 07/07/19 0917   07/03/19 1845  azithromycin (ZITHROMAX) tablet 500 mg       "Followed by" Linked Group Details   500 mg Oral Daily 07/03/19 1837 07/03/19 1853      Subjective:   Cheryl [name] seen and examined today.  Patient with dementia, and repeats herself a few times.  Has no complaints this morning. Objective:   Vitals:   07/11/19 1939 07/12/19 0423 07/12/19 0814 07/12/19 0815  BP: (!) 146/99 (!) 143/82 (!) 107/95   Pulse: (!) 108 71 (!) 116   Resp:   18   Temp: 98.3 F (36.8 C) 97.7 F (36.5 C) 98.4 F (36.9 C)   TempSrc: Oral Oral Oral   SpO2: 99% 97% 97% 96%  Weight:  94.9 kg    Height:        Intake/Output Summary (Last 24 hours) at 07/12/2019 1046 Last data filed at 07/12/2019 0950 Gross per 24 hour  Intake 1090 ml  Output 3200 ml  Net -2110 ml   Filed Weights    07/11/19 0010 07/11/19 0500 07/12/19 0423  Weight: 97 kg 97 kg 94.9 kg   Exam  General: Well developed, chronically ill-appearing, NAD  HEENT: NCAT,  mucous membranes moist.   Cardiovascular: S1 S2 auscultated, tachycardic  Respiratory: Clear to auscultation bilaterally with equal chest rise  Abdomen: Soft, obese, nontender, nondistended, + bowel sounds  Extremities: warm dry without cyanosis clubbing or edema  Neuro: AAOx1 (self only) dementia, follows commands and moves all extremities  Psych: Appropriate mood and affect  Data Reviewed: I have personally reviewed following labs and imaging studies  CBC: Recent Labs  Lab 07/06/19 0027 07/07/19 0657 07/09/19 0438 07/10/19 0753  WBC 9.8 8.4 9.9 6.5  HGB 9.6* 11.3* 10.8* 10.2*  HCT 30.4* 37.2 33.9* 31.8*  MCV 85.4 88.4 85.6 86.4  PLT 350 400 379 469   Basic Metabolic Panel: Recent Labs  Lab 07/08/19 0625 07/09/19 0438 07/10/19 0537 07/11/19 0705 07/12/19 0553  NA 138 135 139 140 142  K 3.8 4.5 3.8 4.3 4.3  CL 102 101 107 104 104  CO2 27 26 24 28 31   GLUCOSE 116* 208* 67* 99 95  BUN 52* 60* 44* 30* 23  CREATININE 1.49* 1.88* 1.55* 1.34* 1.31*  CALCIUM 8.5* 8.9 8.3* 8.8* 8.7*  MG 1.8  --   --   --   --    GFR: Estimated Creatinine Clearance: 40.2 mL/min (A) (by C-G formula based on SCr of 1.31 mg/dL (H)). Liver Function Tests: No results for input(s): AST, ALT, ALKPHOS, BILITOT, PROT, ALBUMIN in the last 168 hours. No results for input(s): LIPASE, AMYLASE in the last 168 hours. No results for input(s): AMMONIA in the last 168 hours. Coagulation Profile: No results for input(s): INR, PROTIME in the last 168 hours. Cardiac Enzymes: No results for input(s): CKTOTAL, CKMB, CKMBINDEX, TROPONINI in the last 168 hours. BNP (last 3 results) No results for input(s): PROBNP in the last 8760 hours. HbA1C: No results for input(s): HGBA1C in the last 72 hours. CBG: Recent Labs  Lab 07/11/19 0825 07/11/19 1159  07/11/19 1618 07/11/19 2128 07/12/19 0816  GLUCAP 91 108* 154* 115* 100*   Lipid Profile: No results for input(s): CHOL, HDL, LDLCALC, TRIG, CHOLHDL, LDLDIRECT in the last 72 hours. Thyroid Function Tests: No results for input(s): TSH, T4TOTAL, FREET4, T3FREE, THYROIDAB in the last 72 hours. Anemia Panel: No results for input(s): VITAMINB12, FOLATE, FERRITIN, TIBC, IRON, RETICCTPCT in the last 72 hours. Urine analysis:    Component Value Date/Time   COLORURINE YELLOW (A) 05/09/2019 2335   APPEARANCEUR CLEAR (A) 05/09/2019 2335   APPEARANCEUR Hazy 07/26/2013 1013   LABSPEC 1.038 (H) 05/09/2019 2335   LABSPEC 1.029 07/26/2013 1013   PHURINE 5.0 05/09/2019 2335   GLUCOSEU 50 (A) 05/09/2019 2335   GLUCOSEU Negative 07/26/2013 1013   HGBUR NEGATIVE 05/09/2019 2335   BILIRUBINUR NEGATIVE 05/09/2019 2335   BILIRUBINUR Negative 07/26/2013 1013   KETONESUR 5 (A) 05/09/2019 2335   PROTEINUR 30 (A) 05/09/2019 2335   NITRITE NEGATIVE 05/09/2019 2335   LEUKOCYTESUR NEGATIVE 05/09/2019 2335   LEUKOCYTESUR Negative 07/26/2013 1013   Sepsis Labs: @LABRCNTIP (procalcitonin:4,lacticidven:4)  ) Recent Results (from the past 240 hour(s))  SARS Coronavirus 2 by RT PCR (hospital order, performed in [place] hospital lab) Nasopharyngeal Nasopharyngeal Swab     Status: None   Collection Time: 07/03/19  2:19 PM   Specimen: Nasopharyngeal Swab  Result Value Ref Range Status   SARS Coronavirus 2 NEGATIVE NEGATIVE Final    Comment: (NOTE) SARS-CoV-2 target nucleic acids are NOT DETECTED.  The SARS-CoV-2 RNA is generally detectable in upper and lower respiratory specimens during the acute phase of infection. The lowest concentration of SARS-CoV-2 viral copies this assay can detect is 250 copies / mL. A negative result does not preclude SARS-CoV-2 infection and should not be used as the sole basis for treatment or other patient management decisions.  A negative result may occur with improper  specimen collection / handling, submission of specimen other than nasopharyngeal swab, presence of viral mutation(s) within the areas targeted by this assay, and inadequate number of viral copies (<250 copies / mL). A negative result must be combined with clinical observations, patient history, and epidemiological information.  Fact Sheet for Patients:   StrictlyIdeas.no  Fact Sheet for Healthcare Providers: BankingDealers.co.za  This test is not yet approved or  cleared by the [place] FDA and has been authorized for detection and/or diagnosis of SARS-CoV-2 by FDA under an Emergency Use Authorization (EUA).  This EUA will remain in effect (meaning this test can be used) for the duration of the COVID-19 declaration under Section 564(b)(1) of the Act, 21 U.S.C. section  360bbb-3(b)(1), unless the authorization is terminated or revoked sooner.  Performed at [facility], [address] [place], [place] [address]   MRSA PCR Screening     Status: None   Collection Time: 07/04/19 12:20 PM   Specimen: Nasopharyngeal  Result Value Ref Range Status   MRSA by PCR NEGATIVE NEGATIVE Final    Comment:        The GeneXpert MRSA Assay (FDA approved for NASAL specimens only), is one component of a comprehensive MRSA colonization surveillance program. It is not intended to diagnose MRSA infection nor to guide or monitor treatment for MRSA infections. Performed at [facility], [address] [place], [place] [address]   MRSA PCR Screening     Status: None   Collection Time: 07/08/19  4:00 PM   Specimen: Nasal Mucosa; Nasopharyngeal  Result Value Ref Range Status   MRSA by PCR NEGATIVE NEGATIVE Final    Comment:        The GeneXpert MRSA Assay (FDA approved for NASAL specimens only), is one component of a comprehensive MRSA colonization surveillance program. It is not intended to diagnose MRSA infection nor to  guide or monitor treatment for MRSA infections. Performed at [facility], [address], [place], [place] [address]       Radiology Studies: No results found.   Scheduled Meds: . ALPRAZolam  0.25 mg Oral BID  . atorvastatin  20 mg Oral QHS  . Chlorhexidine Gluconate Cloth  6 each Topical Daily  . dextromethorphan-guaiFENesin  1 tablet Oral BID  . memantine  28 mg Oral Daily   And  . donepezil  10 mg Oral QHS  . ferrous sulfate  325 mg Oral BID WC  . fluticasone  2 spray Each Nare Daily  . insulin aspart  0-9 Units Subcutaneous TID WC  . insulin glargine  7 Units Subcutaneous Daily  . levalbuterol  0.63 mg Nebulization Q8H  . metoprolol tartrate  25 mg Oral Q6H  . midodrine  10 mg Oral TID WC  . modafinil  100 mg Oral Daily  . mometasone-formoterol  2 puff Inhalation BID  . pantoprazole  40 mg Oral Daily  . perphenazine  4 mg Oral TID  . QUEtiapine  200 mg Oral QHS  . rivaroxaban  15 mg Oral Q supper  . sodium chloride flush  10-40 mL Intracatheter Q12H   Continuous Infusions:   LOS: 9 days   Time Spent in minutes   30 minutes  [place] Nolton D.O. on 07/12/2019 at 10:46 AM  Between 7am to 7pm - Please see pager noted on [email or web address]  After 7pm go to [email or web address]  And look for the night coverage person covering for me after hours  [facility] Office  [phone]

## 2019-07-12 NOTE — Progress Notes (Signed)
Physical Therapy Treatment Patient Details Name: Cheryl [name] MRN: [id number] DOB: [birth year] Today's Date: 07/12/2019    History of Present Illness presented to ER secondary to progressive SOB; admitted for management of acute/chronic respiratory failure with hypoxia due to COPD exacerbation.  Hospital course also significant for aflutter with RVR, elevated troponin (demand), managed with amioderone and heparin throughout stay.    PT Comments    Patient received in bed, agrees to PT session. Initially upon entering room patient was soiled in bed. NT called. Patient requires mod +2 assist for supine to sit. Audible wheezing with mobility. O2 sats at 98% on room air. She is impulsive with mobility and requires +2 assist for safety. Ambulated a few steps from bed to recliner. B LE buckling. She is limited by weakness and decreased activity tolerance. She will continue to benefit from skilled PT while here to improve strength and independence.       Follow Up Recommendations  SNF     Equipment Recommendations  None recommended by PT    Recommendations for Other Services       Precautions / Restrictions Precautions Precautions: Fall Restrictions Weight Bearing Restrictions: No    Mobility  Bed Mobility Overal bed mobility: Needs Assistance Bed Mobility: Supine to Sit     Supine to sit: Min assist;+2 for physical assistance        Transfers Overall transfer level: Needs assistance Equipment used: Rolling walker (2 wheeled) Transfers: Sit to/from Stand Sit to Stand: Min assist;+2 physical assistance         General transfer comment: patient is impulsive with mobility, requires cues to wait until PT and tech ready to assist.  Ambulation/Gait Ambulation/Gait assistance: Min assist;+2 physical assistance Gait Distance (Feet): 5 Feet Assistive device: Rolling walker (2 wheeled) Gait Pattern/deviations: Step-to pattern;Decreased stride length Gait velocity: decr    General Gait Details: short, choppy steps; significant buckling (L > R) with standing/stepping attempts.  Poor balance, generally impulsive with limited attempts to self-correct buckling or balance losses.  Unsafe to attempt without +2 at all times   Stairs             Wheelchair Mobility    Modified Rankin (Stroke Patients Only)       Balance Overall balance assessment: Needs assistance Sitting-balance support: Feet supported;Bilateral upper extremity supported Sitting balance-Leahy Scale: Fair     Standing balance support: Bilateral upper extremity supported;During functional activity Standing balance-Leahy Scale: Poor Standing balance comment: unsteady with standing. cues and assist to position hands properly on walker. Patient with decreased awareness                            Cognition Arousal/Alertness: Awake/alert Behavior During Therapy: WFL for tasks assessed/performed Overall Cognitive Status: History of cognitive impairments - at baseline                                 General Comments: pt with advanced dementia; oriented to self only; follows 1-2 step commands consistently; generally poor historian; demonstrates difficulties with immediate and short term recall      Exercises Other Exercises Other Exercises: LE exercises: LAQ, marching x 10 reps    General Comments        Pertinent Vitals/Pain Pain Assessment: No/denies pain    Home Living  Prior Function            PT Goals (current goals can now be found in the care plan section) Acute Rehab PT Goals Patient Stated Goal: none stated PT Goal Formulation: Patient unable to participate in goal setting Time For Goal Achievement: 07/21/19 Potential to Achieve Goals: Fair Progress towards PT goals: Progressing toward goals    Frequency    Min 2X/week      PT Plan Current plan remains appropriate    Co-evaluation               AM-PAC PT "6 Clicks" Mobility   Outcome Measure  Help needed turning from your back to your side while in a flat bed without using bedrails?: A Little Help needed moving from lying on your back to sitting on the side of a flat bed without using bedrails?: A Lot Help needed moving to and from a bed to a chair (including a wheelchair)?: A Lot Help needed standing up from a chair using your arms (e.g., wheelchair or bedside chair)?: A Lot Help needed to walk in hospital room?: A Lot Help needed climbing 3-5 steps with a railing? : Total 6 Click Score: 12    End of Session Equipment Utilized During Treatment: Gait belt Activity Tolerance: Patient tolerated treatment well;Patient limited by fatigue Patient left: in chair;with call bell/phone within reach;with chair alarm set Nurse Communication: Mobility status PT Visit Diagnosis: Other abnormalities of gait and mobility (R26.89);Unsteadiness on feet (R26.81);Muscle weakness (generalized) (M62.81);Difficulty in walking, not elsewhere classified (R26.2)     Time: 1255-1310 PT Time Calculation (min) (ACUTE ONLY): 15 min  Charges:  $Therapeutic Activity: 8-22 mins                     Siddhi Dornbush, PT, GCS 07/12/19,1:25 PM

## 2019-07-12 NOTE — Progress Notes (Signed)
Progress Note  Patient Name: Cheryl [name] Date of Encounter: 07/12/2019  Primary Cardiologist: New to [facility] - consult by Fletcher Anon  Subjective   She remains in atrial flutter with improved ventricular rates in the 80s to low 100s bpm with stable BP in the 829H to 371I systolic. She is tolerating metoprolol and Xarleto. Still, no one has been able to get in contact with the patient's legal guardian, her son. No further history able to be obtained from the patient secondary to dementia.   Inpatient Medications    Scheduled Meds: . ALPRAZolam  0.25 mg Oral BID  . atorvastatin  20 mg Oral QHS  . Chlorhexidine Gluconate Cloth  6 each Topical Daily  . dextromethorphan-guaiFENesin  1 tablet Oral BID  . memantine  28 mg Oral Daily   And  . donepezil  10 mg Oral QHS  . ferrous sulfate  325 mg Oral BID WC  . fluticasone  2 spray Each Nare Daily  . insulin aspart  0-9 Units Subcutaneous TID WC  . insulin glargine  7 Units Subcutaneous Daily  . levalbuterol  0.63 mg Nebulization Q8H  . metoprolol tartrate  25 mg Oral Q6H  . midodrine  10 mg Oral TID WC  . modafinil  100 mg Oral Daily  . mometasone-formoterol  2 puff Inhalation BID  . pantoprazole  40 mg Oral Daily  . perphenazine  4 mg Oral TID  . QUEtiapine  200 mg Oral QHS  . rivaroxaban  15 mg Oral Q supper  . sodium chloride flush  10-40 mL Intracatheter Q12H   Continuous Infusions:  PRN Meds: acetaminophen, albuterol, alum & mag hydroxide-simeth, magnesium hydroxide, ondansetron (ZOFRAN) IV, sodium chloride flush   Vital Signs    Vitals:   07/11/19 1536 07/11/19 1617 07/11/19 1939 07/12/19 0423  BP:  (!) 141/82 (!) 146/99 (!) 143/82  Pulse:  (!) 115 (!) 108 71  Resp:  18    Temp:  98.1 F (36.7 C) 98.3 F (36.8 C) 97.7 F (36.5 C)  TempSrc:  Oral Oral Oral  SpO2: 98% 99% 99% 97%  Weight:    94.9 kg  Height:        Intake/Output Summary (Last 24 hours) at 07/12/2019 0724 Last data filed at 07/12/2019 0601 Gross per  24 hour  Intake 370 ml  Output 3200 ml  Net -2830 ml   Filed Weights   07/11/19 0010 07/11/19 0500 07/12/19 0423  Weight: 97 kg 97 kg 94.9 kg    Telemetry    Atrial flutter with RVR, 80s to low 100s bpm - Personally Reviewed  ECG    No new tracings - Personally Reviewed  Physical Exam   GEN: No acute distress.   Neck: No JVD. Cardiac: RRR, no murmurs, rubs, or gallops.  Respiratory: Clear to auscultation bilaterally.  GI: Soft, nontender, non-distended.   MS: No edema; No deformity. Neuro:  Alert and not oriented; Nonfocal.  Psych: Unable to assess secondary to dementia.  Labs    Chemistry Recent Labs  Lab 07/10/19 0537 07/11/19 0705 07/12/19 0553  NA 139 140 142  K 3.8 4.3 4.3  CL 107 104 104  CO2 24 28 31   GLUCOSE 67* 99 95  BUN 44* 30* 23  CREATININE 1.55* 1.34* 1.31*  CALCIUM 8.3* 8.8* 8.7*  GFRNONAA 32* 38* 39*  GFRAA 37* 44* 45*  ANIONGAP 8 8 7      Hematology Recent Labs  Lab 07/07/19 0657 07/09/19 0438 07/10/19 0753  WBC  8.4 9.9 6.5  RBC 4.21 3.96 3.68*  HGB 11.3* 10.8* 10.2*  HCT 37.2 33.9* 31.8*  MCV 88.4 85.6 86.4  MCH 26.8 27.3 27.7  MCHC 30.4 31.9 32.1  RDW 16.6* 16.4* 16.9*  PLT 400 379 315    Cardiac EnzymesNo results for input(s): TROPONINI in the last 168 hours. No results for input(s): TROPIPOC in the last 168 hours.   BNPNo results for input(s): BNP, PROBNP in the last 168 hours.   DDimer No results for input(s): DDIMER in the last 168 hours.   Radiology    No results found.  Cardiac Studies   2D echo 07/04/2019: 1. Left ventricular ejection fraction, by estimation, is 30 to 35%. The  left ventricle has moderately decreased function. The left ventricle  demonstrates global hypokinesis. There is mild left ventricular  hypertrophy. Left ventricular diastolic  parameters are indeterminate.  2. Right ventricular systolic function is mildly reduced. The right  ventricular size is mildly enlarged. There is mildly  elevated pulmonary  artery systolic pressure.  3. Left atrial size was mildly dilated.  4. Right atrial size was moderately dilated.  5. The mitral valve is normal in structure. Trivial mitral valve  regurgitation. No evidence of mitral stenosis.  6. Tricuspid valve regurgitation is moderate.  7. The aortic valve is normal in structure. Aortic valve regurgitation is  trivial. Mild aortic valve sclerosis is present, with no evidence of  aortic valve stenosis.  8. Moderately dilated pulmonary artery.  Patient Profile     79 y.o. female with history of DM2, HTN, HLD, cognitive impairment/dementia, COPD, CKD stage III, lung cancer s/p radiation therapy, iron deficiency anemia, depression, anxiety, and GERDwho is being seen today for the evaluation of acute systolic CHF and atrial flutter with RVR with subsequent junctional rhythm and hypotension.   Assessment & Plan    1. Afib/flutter with RVR: -Admitted with new onset atrial flutter with RVR of uncertain chronicity  -Treatment options were limited at that time, and thus she was placed on IV amiodarone, despite CVA risk -She subsequently converted with an ~ 5 second post termination pause on 6/25 followed by persistent junctional rhythm with associated hypotension requiring atropine followed by dopamine infusion in the ICU -Following discontinuation of dopamine, she has been transferred back to progressive unit and is now back in atrial flutter with improved ventricular rates in the 80s to low 100s bpm -She is asymptomatic -Continue metoprolol 25 mg q 6 hours for rate control -EP will be in house on 7/1, plan to have them see her at that time for consideration of PPM vs atrial flutter ablation in the setting of underlying advanced demntia -CHADS2VASc at least 6 (CHF, HTN, age x 2, DM2, gender) -Xarelto 15 mg -Estimated Creatinine Clearance: 40.2 mL/min (A) (by C-G formula based on SCr of 1.31 mg/dL (H))  2. Junctional  rhythm/hypotension: -As above, currently back in atrial flutter with RVR  3. HFrEF: -Uncertain chronicity  -She denies any dyspnea  -She appears grossly euvolemic and well compensated  -Metoprolol as above, ideally with her cardiomyopathy, would place her on Coreg or Toprol, though with her atrial flutter, Lopressor is needed at this time for added rate control -No ACEi/ARB/Entresto/MRA at this time to allow for BP room for titration of beta blocker as needed for rate control  -Escalate GDMT as able  4. Elevated HS-Tn: -Denies chest pain -Mildly elevated and flat trending -Not consistent with ACS -No plans for inpatient ischemic evaluation in the setting of advanced  dementia  -No ASA with Xarleto  5. Acute on CKD stage III: -Renal function continues to improve   6. Dementia: -Despite daily attempts, no one has been able to get in touch with the patient's legal guardian, Richardson Landry, her son -We will attempt to contact him again today   For questions or updates, please contact [place] Please consult [email or web address] for contact info under Cardiology/STEMI.    Signed, Christell Faith, PA-C [place] Pager: [phone] 07/12/2019, 7:24 AM

## 2019-07-12 NOTE — Progress Notes (Signed)
Occupational Therapy Treatment Patient Details Name: Cheryl [name] MRN: [id number] DOB: [birth year] Today's Date: 07/12/2019    History of present illness presented to ER secondary to progressive SOB; admitted for management of acute/chronic respiratory failure with hypoxia due to COPD exacerbation.  Hospital course also significant for aflutter with RVR, elevated troponin (demand), managed with amioderone and heparin throughout stay.   OT comments  Ms. Meloche was seen for OT treatment on this date. Upon arrival to room pt awake/alert, seated upright in room recliner. OT initiates session using positive approach hand under hand strategies. Pt pleasantly confused t/o session. She requires minimal prompting for initiation of seated ADL tasks including oral care, face washing/drying, and hair combing. She engages well with activities and is observed to respond well to object prompting as well as modeling during session. Pt noted with mild echolalia, but remains generally appropriate with verbal responses. HR/SPO2 monitored t/o functional tasks and remain generally WLFs with HR 116-120 with seated activity and spO2 96-97%. Pt on RA. Pt making good progress toward goals and continues to benefit from skilled OT services to maximize return to PLOF and minimize risk of future falls, injury, caregiver burden, and readmission. Will continue to follow POC. Discharge recommendation remains appropriate.    Follow Up Recommendations  SNF    Equipment Recommendations  Other (comment) (TBD at next venue of care.)    Recommendations for Other Services      Precautions / Restrictions Precautions Precautions: Fall Restrictions Weight Bearing Restrictions: No       Mobility Bed Mobility Overal bed mobility: Needs Assistance Bed Mobility: Supine to Sit     Supine to sit: Min assist;+2 for physical assistance     General bed mobility comments: pt up in recliner on  arrival/departure  Transfers Overall transfer level: Needs assistance Equipment used: Rolling walker (2 wheeled) Transfers: Sit to/from Stand Sit to Stand: Min assist;+2 physical assistance         General transfer comment: Per PT note from this date, pt requires +2 MIN A for fxl mobility. Is impulsive with movements with knee buckling.    Balance Overall balance assessment: Needs assistance Sitting-balance support: Feet supported;No upper extremity supported Sitting balance-Leahy Scale: Fair Sitting balance - Comments: steady static sitting balance during grooming/hygiene   Standing balance support: Bilateral upper extremity supported;During functional activity Standing balance-Leahy Scale: Poor Standing balance comment: unsteady with standing. cues and assist to position hands properly on walker. Patient with decreased awareness                           ADL either performed or assessed with clinical judgement   ADL Overall ADL's : Needs assistance/impaired     Grooming: Wash/dry face;Cueing for sequencing;Sitting;Set up;Oral care;Brushing hair Grooming Details (indicate cue type and reason): verbal and visual cues to initiate, pt completes task without physical assist. Requires min re-direction to task. Increased SOB noted with hair brushing, however spO2 remains WFLs ~97%. HR 115-120 during seated activity. Return demos PLB technique with max cueing/modeling from therapist does not initiate independently with activity.                               General ADL Comments: pt requires visual and verbal cues throughout, responds well to visual/object prompting. Mild echolalia t/o ADLs.     Vision Baseline Vision/History: No visual deficits Patient Visual Report: No change from baseline Additional Comments:  Pt appears to visually track appropriately. Locates small items on cluttered tray such as toothpaste cap, etc.   Perception     Praxis       Cognition Arousal/Alertness: Awake/alert Behavior During Therapy: WFL for tasks assessed/performed Overall Cognitive Status: History of cognitive impairments - at baseline                                 General Comments: pt with advanced dementia; oriented to self only; follows 1-2 step commands consistently; generally poor historian; demonstrates difficulties with immediate and short term recall        Exercises Other Exercises Other Exercises: OT facilitates seated grooming tasks as described above including seated: oral care, face washing, and hair combing. See ADL section for additional detail regarding occupational performance. Other Exercises: LE exercises: LAQ, marching x 10 reps   Shoulder Instructions       General Comments      Pertinent Vitals/ Pain       Pain Assessment: No/denies pain  Home Living                                          Prior Functioning/Environment              Frequency  Min 1X/week        Progress Toward Goals  OT Goals(current goals can now be found in the care plan section)  Progress towards OT goals: Progressing toward goals  Acute Rehab OT Goals Patient Stated Goal: none stated OT Goal Formulation: With patient Time For Goal Achievement: 07/21/19 Potential to Achieve Goals: Good  Plan Discharge plan remains appropriate;Frequency remains appropriate    Co-evaluation                 AM-PAC OT "6 Clicks" Daily Activity     Outcome Measure   Help from another person eating meals?: A Little Help from another person taking care of personal grooming?: A Little Help from another person toileting, which includes using toliet, bedpan, or urinal?: A Lot Help from another person bathing (including washing, rinsing, drying)?: A Lot Help from another person to put on and taking off regular upper body clothing?: A Little Help from another person to put on and taking off regular lower body  clothing?: A Lot 6 Click Score: 15    End of Session    OT Visit Diagnosis: Other abnormalities of gait and mobility (R26.89);Muscle weakness (generalized) (M62.81);Other symptoms and signs involving cognitive function   Activity Tolerance Patient tolerated treatment well   Patient Left in chair;with call bell/phone within reach;with chair alarm set   Nurse Communication          Time: 5809-9833 OT Time Calculation (min): 14 min  Charges: OT General Charges $OT Visit: 1 Visit OT Treatments $Self Care/Home Management : 8-22 mins  Shara Blazing, M.S., OTR/L Ascom: [phone] 07/12/19, 3:23 PM

## 2019-07-12 NOTE — Progress Notes (Signed)
Nutrition Brief Note  Patient identified for LOS day 10  79 y.o.femalewith medical history significant ofhypertension, hyperlipidemia, diabetes mellitus, COPD, GERD, depression, anxiety, lung cancer (s/p of radiation therapy), dementia, iron deficiency anemia, CKD-3, who presents with shortness of breath.  Wt Readings from Last 15 Encounters:  07/12/19 94.9 kg  05/16/19 92.2 kg  10/19/17 87.9 kg  07/20/17 86.6 kg  04/30/17 88 kg  10/13/16 86.1 kg  05/04/16 86.2 kg  04/07/16 86 kg  01/17/16 84.8 kg  01/10/16 84.5 kg  01/05/16 76.2 kg  09/07/15 83.3 kg  02/07/15 84.2 kg  08/23/14 84.3 kg    Body mass index is 35.91 kg/m. Patient meets criteria for obesity based on current BMI.   Current diet order is HH/CHO modified, patient is consuming approximately 100% of meals at this time. Labs and medications reviewed.   No nutrition interventions warranted at this time. If nutrition issues arise, please consult RD.   Koleen Distance MS, RD, LDN Please refer to [facility] for RD and/or RD on-call/weekend/after hours pager

## 2019-07-13 LAB — BASIC METABOLIC PANEL
Anion gap: 8 (ref 5–15)
BUN: 24 mg/dL — ABNORMAL HIGH (ref 8–23)
CO2: 30 mmol/L (ref 22–32)
Calcium: 8.7 mg/dL — ABNORMAL LOW (ref 8.9–10.3)
Chloride: 102 mmol/L (ref 98–111)
Creatinine, Ser: 1.3 mg/dL — ABNORMAL HIGH (ref 0.44–1.00)
GFR calc Af Amer: 46 mL/min — ABNORMAL LOW (ref >60)
GFR calc non Af Amer: 40 mL/min — ABNORMAL LOW (ref >60)
Glucose, Bld: 126 mg/dL — ABNORMAL HIGH (ref 70–99)
Potassium: 4 mmol/L (ref 3.5–5.1)
Sodium: 140 mmol/L (ref 135–145)

## 2019-07-13 LAB — CBC
HCT: 36.2 % (ref 36.0–46.0)
Hemoglobin: 11.1 g/dL — ABNORMAL LOW (ref 12.0–15.0)
MCH: 27.4 pg (ref 26.0–34.0)
MCHC: 30.7 g/dL (ref 30.0–36.0)
MCV: 89.4 fL (ref 80.0–100.0)
Platelets: 300 10*3/uL (ref 150–400)
RBC: 4.05 MIL/uL (ref 3.87–5.11)
RDW: 17.1 % — ABNORMAL HIGH (ref 11.5–15.5)
WBC: 5.7 10*3/uL (ref 4.0–10.5)
nRBC: 0 % (ref 0.0–0.2)

## 2019-07-13 LAB — GLUCOSE, CAPILLARY
Glucose-Capillary: 106 mg/dL — ABNORMAL HIGH (ref 70–99)
Glucose-Capillary: 97 mg/dL (ref 70–99)
Glucose-Capillary: 136 mg/dL — ABNORMAL HIGH (ref 70–99)
Glucose-Capillary: 151 mg/dL — ABNORMAL HIGH (ref 70–99)

## 2019-07-13 MED ORDER — RIVAROXABAN 20 MG PO TABS
20.0000 mg | ORAL_TABLET | Freq: Every day | ORAL | Status: DC
  Administered 2019-07-13: 20 mg via ORAL
  Filled 2019-07-13: qty 1

## 2019-07-13 NOTE — Progress Notes (Signed)
Pt left off of cpap after neb

## 2019-07-13 NOTE — Progress Notes (Signed)
PROGRESS NOTE    Cheryl [name]  TIW:[id number] DOB: 09-17-1940 DOA: 07/03/2019 PCP: Letta Median, MD   Chief complaint.  Shortness of breath.  Brief Narrative:  HPI on 07/03/2019 by Dr. Mertha Finders Carveris a 79 y.o.femalewith medical history significant ofhypertension, hyperlipidemia, diabetes mellitus, COPD, GERD, depression, anxiety, lung cancer (s/p of radiation therapy), dementia, iron deficiency anemia, CKD-3, who presents with shortness of breath.  Patient hasdementia, and isa poor historian, therefore, most of the history is obtained by discussing the case with ED physician, per EMS report, and with the nursing staff.History is limited. Patient has been having shortness of breath for several days, which has been progressively worsening. Patient is not on oxygen at home, but requiring 4 L oxygen with oxygen saturation 100% in ED. Patient has dry cough, but denies chest pain. No nausea,vomiting, diarrhea, abdominal pain. Denies symptoms of UTI. Patient moves all extremities.  Interim history Admitted for resp failure/ COPD Exacerbation.  Found to have elevated troponin with atrial flutter RVR, cardiology consulted.  On 07/08/2019, patient developed a junctional rhythm with a sinus pause and became bradycardic and was given atropine.  She was then transferred to the ICU and placed on a dopamine drip given her hypotension.  PCCM was consulted.  Dopamine drip has been discontinued.  Patient was transferred back to progressive.  Patient did become tachycardic.  Cardiology would like for EP to evaluate patient, which will be done on 07/14/2019.   Assessment & Plan:   Principal Problem:   COPD exacerbation ([place]) Active Problems:   HTN (hypertension)   HLD (hyperlipidemia)   Dementia (HCC)   Type II diabetes mellitus with renal manifestations (HCC)   Elevated troponin   Acute on chronic respiratory failure with hypoxia (HCC)   Acute renal failure superimposed on  stage 3a chronic kidney disease (HCC)   Persistent atrial fibrillation (HCC)   Systolic heart failure (HCC)   Atrial flutter ([place])   Sinus node dysfunction ([place])  #1.  Acute hypoxemic restaurant failure.  Multifactorial secondary to COPD exacerbation, congestive heart failure. Condition has improved.  Currently does not have seems short of breath.  Off oxygen.  2.  Acute on chronic combined systolic and diastolic congestive heart failure. Ejection fraction 30 to 35% on echocardiogram.  Appreciate cardiology consult.  Condition has been improving.  3.  Paroxysmal atrial flutter with RVR, sinus bradycardia with long pauses. Patient may have sick sinus syndrome.  Scheduled to be seen by electrophysiology tomorrow.  Continue amiodarone and metoprolol. On Xarelto.  4.  Hypotension. Appear to be cardiogenic.  Patient required dopamine drip in the ICU.  Blood pressure has been stable on midodrine.  5.  Chronic kidney disease stage IIIa with acute kidney injury. Renal function had improved to baseline.  6.  Type 2 diabetes. Continue current regimen.  7.  Dementia. Still has some confusion.  Continue home medicines.  8.  Essential hypertension. Continue current treatment.   DVT prophylaxis: Xarelto. Code Status: Full Family Communication: None Disposition Plan:  . Patient came from:            . Anticipated d/c place: SNF . Barriers to d/c OR conditions which need to be met to effect a safe d/c:   Consultants:   Cardiology  Procedures:None Antimicrobials:   None Subjective: Patient doing well today.  No significant short of breath.  Off oxygen. Spoke with the nurse, patient has some confusion occasionally. No abdominal pain or nausea vomiting.  Objective: Vitals:   07/13/19  0758 07/13/19 1100 07/13/19 1130 07/13/19 1205  BP: (!) 141/73  (!) 135/93 (!) 135/93  Pulse: (!) 40 (!) 102 92 (!) 101  Resp: 19  18   Temp: (!) 97.5 F (36.4 C)     TempSrc:      SpO2: 100%   98% 100%  Weight:      Height:        Intake/Output Summary (Last 24 hours) at 07/13/2019 1423 Last data filed at 07/13/2019 1023 Gross per 24 hour  Intake 360 ml  Output 900 ml  Net -540 ml   Filed Weights   07/11/19 0500 07/12/19 0423 07/13/19 0453  Weight: 97 kg 94.9 kg 94.8 kg    Examination:  General exam: Appears calm and comfortable  Respiratory system: Clear to auscultation. Respiratory effort normal. Cardiovascular system: Irregular. No JVD, murmurs, rubs, gallops or clicks. No pedal edema. Gastrointestinal system: Abdomen is nondistended, soft and nontender. No organomegaly or masses felt. Normal bowel sounds heard. Central nervous system: Alert and oriented x2. No focal neurological deficits. Extremities: Symmetric  Skin: No rashes, lesions or ulcers Psychiatry: Mood & affect appropriate.     Data Reviewed: I have personally reviewed following labs and imaging studies  CBC: Recent Labs  Lab 07/07/19 0657 07/09/19 0438 07/10/19 0753 07/13/19 0605  WBC 8.4 9.9 6.5 5.7  HGB 11.3* 10.8* 10.2* 11.1*  HCT 37.2 33.9* 31.8* 36.2  MCV 88.4 85.6 86.4 89.4  PLT 400 379 315 600   Basic Metabolic Panel: Recent Labs  Lab 07/08/19 0625 07/08/19 0625 07/09/19 0438 07/10/19 0537 07/11/19 0705 07/12/19 0553 07/13/19 0605  NA 138   < > 135 139 140 142 140  K 3.8   < > 4.5 3.8 4.3 4.3 4.0  CL 102   < > 101 107 104 104 102  CO2 27   < > '26 24 28 31 30  ' GLUCOSE 116*   < > 208* 67* 99 95 126*  BUN 52*   < > 60* 44* 30* 23 24*  CREATININE 1.49*   < > 1.88* 1.55* 1.34* 1.31* 1.30*  CALCIUM 8.5*   < > 8.9 8.3* 8.8* 8.7* 8.7*  MG 1.8  --   --   --   --   --   --    < > = values in this interval not displayed.   GFR: Estimated Creatinine Clearance: 40.4 mL/min (A) (by C-G formula based on SCr of 1.3 mg/dL (H)). Liver Function Tests: No results for input(s): AST, ALT, ALKPHOS, BILITOT, PROT, ALBUMIN in the last 168 hours. No results for input(s): LIPASE, AMYLASE in  the last 168 hours. No results for input(s): AMMONIA in the last 168 hours. Coagulation Profile: No results for input(s): INR, PROTIME in the last 168 hours. Cardiac Enzymes: No results for input(s): CKTOTAL, CKMB, CKMBINDEX, TROPONINI in the last 168 hours. BNP (last 3 results) No results for input(s): PROBNP in the last 8760 hours. HbA1C: No results for input(s): HGBA1C in the last 72 hours. CBG: Recent Labs  Lab 07/12/19 1147 07/12/19 1645 07/12/19 2111 07/13/19 0757 07/13/19 1133  GLUCAP 205* 116* 144* 106* 97   Lipid Profile: No results for input(s): CHOL, HDL, LDLCALC, TRIG, CHOLHDL, LDLDIRECT in the last 72 hours. Thyroid Function Tests: No results for input(s): TSH, T4TOTAL, FREET4, T3FREE, THYROIDAB in the last 72 hours. Anemia Panel: No results for input(s): VITAMINB12, FOLATE, FERRITIN, TIBC, IRON, RETICCTPCT in the last 72 hours. Sepsis Labs: No results for input(s): PROCALCITON, LATICACIDVEN in  the last 168 hours.  Recent Results (from the past 240 hour(s))  MRSA PCR Screening     Status: None   Collection Time: 07/04/19 12:20 PM   Specimen: Nasopharyngeal  Result Value Ref Range Status   MRSA by PCR NEGATIVE NEGATIVE Final    Comment:        The GeneXpert MRSA Assay (FDA approved for NASAL specimens only), is one component of a comprehensive MRSA colonization surveillance program. It is not intended to diagnose MRSA infection nor to guide or monitor treatment for MRSA infections. Performed at [facility], [address] [place], [place] [address]   MRSA PCR Screening     Status: None   Collection Time: 07/08/19  4:00 PM   Specimen: Nasal Mucosa; Nasopharyngeal  Result Value Ref Range Status   MRSA by PCR NEGATIVE NEGATIVE Final    Comment:        The GeneXpert MRSA Assay (FDA approved for NASAL specimens only), is one component of a comprehensive MRSA colonization surveillance program. It is not intended to diagnose MRSA infection  nor to guide or monitor treatment for MRSA infections. Performed at [facility], [address], [place], [place] [address]          Radiology Studies: No results found.      Scheduled Meds: . ALPRAZolam  0.25 mg Oral BID  . atorvastatin  20 mg Oral QHS  . Chlorhexidine Gluconate Cloth  6 each Topical Daily  . dextromethorphan-guaiFENesin  1 tablet Oral BID  . memantine  28 mg Oral Daily   And  . donepezil  10 mg Oral QHS  . ferrous sulfate  325 mg Oral BID WC  . fluticasone  2 spray Each Nare Daily  . insulin aspart  0-9 Units Subcutaneous TID WC  . insulin glargine  7 Units Subcutaneous Daily  . levalbuterol  0.63 mg Nebulization Q8H  . metoprolol tartrate  25 mg Oral Q6H  . midodrine  10 mg Oral TID WC  . modafinil  100 mg Oral Daily  . mometasone-formoterol  2 puff Inhalation BID  . pantoprazole  40 mg Oral Daily  . perphenazine  4 mg Oral TID  . QUEtiapine  200 mg Oral QHS  . rivaroxaban  20 mg Oral Q supper  . sodium chloride flush  10-40 mL Intracatheter Q12H   Continuous Infusions:   LOS: 10 days    Time spent: 28 minutes    Sharen Hones, MD [facility]   To contact the attending provider between 7A-7P or the covering provider during after hours 7P-7A, please log into the web site [email or web address] and access using universal [place] password for that web site. If you do not have the password, please call the hospital operator.  07/13/2019, 2:23 PM

## 2019-07-13 NOTE — Progress Notes (Addendum)
ANTICOAGULATION NOTE   Xarelto for A fib  No Known Allergies  Patient Measurements: Height: 5\' 4"  (162.6 cm) Weight: 94.8 kg (209 lb) IBW/kg (Calculated) : 54.7  Vital Signs: Temp: 97.5 F (36.4 C) (06/30 0758) BP: 141/73 (06/30 0758) Pulse Rate: 40 (06/30 0758)  Labs: Recent Labs    07/11/19 0705 07/12/19 0553 07/13/19 0605  HGB  --   --  11.1*  HCT  --   --  36.2  PLT  --   --  300  CREATININE 1.34* 1.31* 1.30*    Estimated Creatinine Clearance: 40.4 mL/min (A) (by C-G formula based on SCr of 1.3 mg/dL (H)).  Total body weight CrCl 54.17ml/min (>50 ml/min consistently 3 days in a row)  Will renally adjust Xarelto dosing to 20mg    Cheryl [name], PharmD, BCPS Clinical Pharmacist 07/13/2019 11:47 AM

## 2019-07-13 NOTE — Progress Notes (Signed)
Progress Note  Patient Name: Cheryl [name] Date of Encounter: 07/13/2019  Primary Cardiologist: New to [facility] - consult by Fletcher Anon  Subjective   She remains in atrial flutter with improved ventricular rates in the 60s to 80s bpm with rare tachycardic episodes to the 1-teens bpm.  Stable BP in the 269S to 854O systolic. She is tolerating metoprolol and Xarleto. Still, no one has been able to get in contact with the patient's legal guardian, her son. No further history able to be obtained from the patient secondary to dementia.   Inpatient Medications    Scheduled Meds: . ALPRAZolam  0.25 mg Oral BID  . atorvastatin  20 mg Oral QHS  . Chlorhexidine Gluconate Cloth  6 each Topical Daily  . dextromethorphan-guaiFENesin  1 tablet Oral BID  . memantine  28 mg Oral Daily   And  . donepezil  10 mg Oral QHS  . ferrous sulfate  325 mg Oral BID WC  . fluticasone  2 spray Each Nare Daily  . insulin aspart  0-9 Units Subcutaneous TID WC  . insulin glargine  7 Units Subcutaneous Daily  . levalbuterol  0.63 mg Nebulization Q8H  . metoprolol tartrate  25 mg Oral Q6H  . midodrine  10 mg Oral TID WC  . modafinil  100 mg Oral Daily  . mometasone-formoterol  2 puff Inhalation BID  . pantoprazole  40 mg Oral Daily  . perphenazine  4 mg Oral TID  . QUEtiapine  200 mg Oral QHS  . rivaroxaban  15 mg Oral Q supper  . sodium chloride flush  10-40 mL Intracatheter Q12H   Continuous Infusions:  PRN Meds: acetaminophen, albuterol, alum & mag hydroxide-simeth, magnesium hydroxide, ondansetron (ZOFRAN) IV, sodium chloride flush   Vital Signs    Vitals:   07/13/19 0602 07/13/19 0723 07/13/19 0744 07/13/19 0758  BP: 138/90 (!) 153/81  (!) 141/73  Pulse: 90 78  (!) 40  Resp:  18  19  Temp:    (!) 97.5 F (36.4 C)  TempSrc:      SpO2: 99% 99% 97% 100%  Weight:      Height:        Intake/Output Summary (Last 24 hours) at 07/13/2019 1059 Last data filed at 07/13/2019 1023 Gross per 24 hour    Intake 840 ml  Output 1301 ml  Net -461 ml   Filed Weights   07/11/19 0500 07/12/19 0423 07/13/19 0453  Weight: 97 kg 94.9 kg 94.8 kg    Telemetry    Atrial flutter with RVR, 60s to 80s bpm with rare tachycardic episodes into the 1-teens bpm - Personally Reviewed  ECG    No new tracings - Personally Reviewed  Physical Exam   GEN: No acute distress.   Neck: No JVD. Cardiac: RRR, no murmurs, rubs, or gallops.  Respiratory: Clear to auscultation bilaterally.  GI: Soft, nontender, non-distended.   MS: No edema; No deformity. Neuro:  Alert and not oriented; Nonfocal.  Psych: Unable to assess secondary to dementia.  Labs    Chemistry Recent Labs  Lab 07/11/19 0705 07/12/19 0553 07/13/19 0605  NA 140 142 140  K 4.3 4.3 4.0  CL 104 104 102  CO2 28 31 30   GLUCOSE 99 95 126*  BUN 30* 23 24*  CREATININE 1.34* 1.31* 1.30*  CALCIUM 8.8* 8.7* 8.7*  GFRNONAA 38* 39* 40*  GFRAA 44* 45* 46*  ANIONGAP 8 7 8      Hematology Recent Labs  Lab 07/09/19  2440 07/10/19 0753 07/13/19 0605  WBC 9.9 6.5 5.7  RBC 3.96 3.68* 4.05  HGB 10.8* 10.2* 11.1*  HCT 33.9* 31.8* 36.2  MCV 85.6 86.4 89.4  MCH 27.3 27.7 27.4  MCHC 31.9 32.1 30.7  RDW 16.4* 16.9* 17.1*  PLT 379 315 300    Cardiac EnzymesNo results for input(s): TROPONINI in the last 168 hours. No results for input(s): TROPIPOC in the last 168 hours.   BNPNo results for input(s): BNP, PROBNP in the last 168 hours.   DDimer No results for input(s): DDIMER in the last 168 hours.   Radiology    No results found.  Cardiac Studies   2D echo 07/04/2019: 1. Left ventricular ejection fraction, by estimation, is 30 to 35%. The  left ventricle has moderately decreased function. The left ventricle  demonstrates global hypokinesis. There is mild left ventricular  hypertrophy. Left ventricular diastolic  parameters are indeterminate.  2. Right ventricular systolic function is mildly reduced. The right  ventricular size  is mildly enlarged. There is mildly elevated pulmonary  artery systolic pressure.  3. Left atrial size was mildly dilated.  4. Right atrial size was moderately dilated.  5. The mitral valve is normal in structure. Trivial mitral valve  regurgitation. No evidence of mitral stenosis.  6. Tricuspid valve regurgitation is moderate.  7. The aortic valve is normal in structure. Aortic valve regurgitation is  trivial. Mild aortic valve sclerosis is present, with no evidence of  aortic valve stenosis.  8. Moderately dilated pulmonary artery.  Patient Profile     79 y.o. female with history of DM2, HTN, HLD, cognitive impairment/dementia, COPD, CKD stage III, lung cancer s/p radiation therapy, iron deficiency anemia, depression, anxiety, and GERDwho is being seen today for the evaluation of acute systolic CHF and atrial flutter with RVR with subsequent junctional rhythm and hypotension.   Assessment & Plan    1. Afib/flutter with RVR: -Admitted with new onset atrial flutter with RVR of uncertain chronicity  -Treatment options were limited at that time, and thus she was placed on IV amiodarone, despite CVA risk -She subsequently converted with an ~ 5 second post termination pause on 6/25 followed by persistent junctional rhythm with associated hypotension requiring atropine followed by dopamine infusion in the ICU -Following discontinuation of dopamine, she has been transferred back to progressive unit and is now back in atrial flutter with improved ventricular rates in the 60s to 80s bpm -She is asymptomatic -Continue metoprolol 25 mg q 6 hours for rate control, which can be consolidated to Toprol XL prior to discharge  -EP will be in house on 7/1, plan to have them see her at that time for consideration of PPM vs atrial flutter ablation in the setting of underlying advanced demntia -CHADS2VASc at least 6 (CHF, HTN, age x 2, DM2, gender) -Xarelto 15 mg -Estimated Creatinine Clearance: 40.4  mL/min (A) (by C-G formula based on SCr of 1.3 mg/dL (H))  2. Junctional rhythm/hypotension: -As above, currently back in atrial flutter with controlled ventricular response  3. HFrEF: -Uncertain chronicity  -She denies any dyspnea  -She appears grossly euvolemic and well compensated  -Metoprolol as above for rate control for now, with her cardiomyopathy, she will need to be discharged on Toprol XL -No ACEi/ARB/Entresto/MRA at this time to allow for BP room for titration of beta blocker as needed for rate control  -Escalate GDMT as able  4. Elevated HS-Tn: -Denies chest pain -Mildly elevated and flat trending -Not consistent with ACS -No  plans for inpatient ischemic evaluation in the setting of advanced dementia  -No ASA with Xarleto  5. Acute on CKD stage III: -Renal function stable  6. Dementia: -Despite daily attempts, no one has been able to get in touch with the patient's legal guardian, Richardson Landry, her son -We will attempt to contact him again today   For questions or updates, please contact [place] Please consult [email or web address] for contact info under Cardiology/STEMI.    Signed, Christell Faith, PA-C [place] Pager: [phone] 07/13/2019, 10:59 AM

## 2019-07-13 NOTE — TOC Progression Note (Signed)
Transition of Care [facility]) - Progression Note    Patient Details  Name: Cheryl [name] MRN: [id number] Date of Birth: Jan 25, 1940  Transition of Care [facility]) CM/SW [place], LCSW Phone Number: 07/13/2019, 11:53 AM  Clinical Narrative:   Pt is set up to d/c to [facility] at d/c.    Expected Discharge Plan: Skilled Nursing Facility Barriers to Discharge: Continued Medical Work up  Expected Discharge Plan and Services Expected Discharge Plan: [place] Choice: [place] arrangements for the past 2 months: [place]                                       Social Determinants of Health (SDOH) Interventions    Readmission Risk Interventions Readmission Risk Prevention Plan 05/10/2019  Transportation Screening Complete  PCP or Specialist Appt within 3-5 Days Complete  HRI or Home Care Consult Complete  Medication Review (RN Care Manager) Complete  Some recent data might be hidden

## 2019-07-14 ENCOUNTER — Inpatient Hospital Stay (HOSPITAL_COMMUNITY)
Admission: EM | Admit: 2019-07-14 | Discharge: 2019-07-31 | DRG: 190 | Disposition: A | Discharge status: Skilled Nursing Facility | Payer: Medicare Other | Source: Skilled Nursing Facility | Attending: Internal Medicine | Admitting: Internal Medicine

## 2019-07-14 DIAGNOSIS — I130 Hypertensive heart and chronic kidney disease with heart failure and stage 1 through stage 4 chronic kidney disease, or unspecified chronic kidney disease: Secondary | ICD-10-CM | POA: Diagnosis present

## 2019-07-14 DIAGNOSIS — F79 Unspecified intellectual disabilities: Secondary | ICD-10-CM | POA: Diagnosis present

## 2019-07-14 DIAGNOSIS — F419 Anxiety disorder, unspecified: Secondary | ICD-10-CM | POA: Diagnosis present

## 2019-07-14 DIAGNOSIS — C3490 Malignant neoplasm of unspecified part of unspecified bronchus or lung: Secondary | ICD-10-CM

## 2019-07-14 DIAGNOSIS — F209 Schizophrenia, unspecified: Secondary | ICD-10-CM | POA: Diagnosis present

## 2019-07-14 DIAGNOSIS — N1832 Chronic kidney disease, stage 3b: Secondary | ICD-10-CM | POA: Diagnosis present

## 2019-07-14 DIAGNOSIS — K219 Gastro-esophageal reflux disease without esophagitis: Secondary | ICD-10-CM | POA: Diagnosis present

## 2019-07-14 DIAGNOSIS — J441 Chronic obstructive pulmonary disease with (acute) exacerbation: Principal | ICD-10-CM | POA: Diagnosis present

## 2019-07-14 DIAGNOSIS — N179 Acute kidney failure, unspecified: Secondary | ICD-10-CM | POA: Diagnosis present

## 2019-07-14 DIAGNOSIS — R627 Adult failure to thrive: Secondary | ICD-10-CM | POA: Diagnosis present

## 2019-07-14 DIAGNOSIS — Z85118 Personal history of other malignant neoplasm of bronchus and lung: Secondary | ICD-10-CM

## 2019-07-14 DIAGNOSIS — Z6835 Body mass index (BMI) 35.0-35.9, adult: Secondary | ICD-10-CM

## 2019-07-14 DIAGNOSIS — I4892 Unspecified atrial flutter: Secondary | ICD-10-CM | POA: Diagnosis present

## 2019-07-14 DIAGNOSIS — E785 Hyperlipidemia, unspecified: Secondary | ICD-10-CM | POA: Diagnosis present

## 2019-07-14 DIAGNOSIS — I429 Cardiomyopathy, unspecified: Secondary | ICD-10-CM | POA: Diagnosis present

## 2019-07-14 DIAGNOSIS — D631 Anemia in chronic kidney disease: Secondary | ICD-10-CM | POA: Diagnosis present

## 2019-07-14 DIAGNOSIS — Z923 Personal history of irradiation: Secondary | ICD-10-CM

## 2019-07-14 DIAGNOSIS — Z79899 Other long term (current) drug therapy: Secondary | ICD-10-CM

## 2019-07-14 DIAGNOSIS — I4819 Other persistent atrial fibrillation: Secondary | ICD-10-CM | POA: Diagnosis present

## 2019-07-14 DIAGNOSIS — J9621 Acute and chronic respiratory failure with hypoxia: Secondary | ICD-10-CM | POA: Diagnosis present

## 2019-07-14 DIAGNOSIS — J9622 Acute and chronic respiratory failure with hypercapnia: Secondary | ICD-10-CM | POA: Diagnosis present

## 2019-07-14 DIAGNOSIS — R6250 Unspecified lack of expected normal physiological development in childhood: Secondary | ICD-10-CM | POA: Diagnosis present

## 2019-07-14 DIAGNOSIS — R609 Edema, unspecified: Secondary | ICD-10-CM | POA: Diagnosis present

## 2019-07-14 DIAGNOSIS — I5023 Acute on chronic systolic (congestive) heart failure: Secondary | ICD-10-CM | POA: Diagnosis present

## 2019-07-14 DIAGNOSIS — J9601 Acute respiratory failure with hypoxia: Secondary | ICD-10-CM | POA: Diagnosis present

## 2019-07-14 DIAGNOSIS — F0390 Unspecified dementia without behavioral disturbance: Secondary | ICD-10-CM | POA: Diagnosis present

## 2019-07-14 DIAGNOSIS — Z7901 Long term (current) use of anticoagulants: Secondary | ICD-10-CM

## 2019-07-14 DIAGNOSIS — Z7982 Long term (current) use of aspirin: Secondary | ICD-10-CM

## 2019-07-14 DIAGNOSIS — I4891 Unspecified atrial fibrillation: Secondary | ICD-10-CM

## 2019-07-14 DIAGNOSIS — Z20822 Contact with and (suspected) exposure to covid-19: Secondary | ICD-10-CM | POA: Diagnosis present

## 2019-07-14 DIAGNOSIS — E1122 Type 2 diabetes mellitus with diabetic chronic kidney disease: Secondary | ICD-10-CM | POA: Diagnosis present

## 2019-07-14 DIAGNOSIS — I5043 Acute on chronic combined systolic (congestive) and diastolic (congestive) heart failure: Secondary | ICD-10-CM | POA: Diagnosis present

## 2019-07-14 DIAGNOSIS — Z87891 Personal history of nicotine dependence: Secondary | ICD-10-CM

## 2019-07-14 DIAGNOSIS — I483 Typical atrial flutter: Secondary | ICD-10-CM

## 2019-07-14 DIAGNOSIS — Z794 Long term (current) use of insulin: Secondary | ICD-10-CM

## 2019-07-14 DIAGNOSIS — I10 Essential (primary) hypertension: Secondary | ICD-10-CM | POA: Diagnosis present

## 2019-07-14 LAB — GLUCOSE, CAPILLARY
Glucose-Capillary: 100 mg/dL — ABNORMAL HIGH (ref 70–99)
Glucose-Capillary: 194 mg/dL — ABNORMAL HIGH (ref 70–99)

## 2019-07-14 LAB — SARS CORONAVIRUS 2 (TAT 6-24 HRS): SARS Coronavirus 2: NEGATIVE

## 2019-07-14 MED ORDER — LEVALBUTEROL HCL 0.63 MG/3ML IN NEBU: 0.6300 mg | INHALATION_SOLUTION | Freq: Three times a day (TID) | RESPIRATORY_TRACT | 0 refills | Status: AC

## 2019-07-14 MED ORDER — RIVAROXABAN 20 MG PO TABS: 20.0000 mg | ORAL_TABLET | Freq: Every day | ORAL | 0 refills | Status: DC

## 2019-07-14 MED ORDER — METOPROLOL SUCCINATE ER 50 MG PO TB24: 50.0000 mg | ORAL_TABLET | Freq: Two times a day (BID) | ORAL | 0 refills | Status: DC

## 2019-07-14 MED ORDER — ALPRAZOLAM 0.25 MG PO TABS: 0.2500 mg | ORAL_TABLET | Freq: Two times a day (BID) | ORAL | 0 refills | Status: DC

## 2019-07-14 NOTE — Progress Notes (Signed)
Patient discharge instructions reported to Dionisio Paschal RN` at the [facility]. All questions answered. PiCC line removed by nurse and dressing applied.

## 2019-07-14 NOTE — Discharge Summary (Signed)
Physician Discharge Summary  Patient ID: Cheryl [name] MRN: [id number] DOB/AGE: 1940-02-13 79 y.o.  Admit date: 07/03/2019 Discharge date: 07/14/2019  Admission Diagnoses:  Discharge Diagnoses:  Principal Problem:   COPD exacerbation ([place]) Active Problems:   HTN (hypertension)   HLD (hyperlipidemia)   Dementia (HCC)   Type II diabetes mellitus with renal manifestations (HCC)   Elevated troponin   Acute on chronic respiratory failure with hypoxia (HCC)   Acute renal failure superimposed on stage 3a chronic kidney disease (HCC)   Persistent atrial fibrillation (HCC)   Systolic heart failure (HCC)   Atrial flutter ([place])   Sinus node dysfunction ([place])   Discharged Condition: fair  Hospital Course:  HPI on 07/03/2019 by Dr. Mertha Finders Carveris a 79 y.o.femalewith medical history significant ofhypertension, hyperlipidemia, diabetes mellitus, COPD, GERD, depression, anxiety, lung cancer (s/p of radiation therapy), dementia, iron deficiency anemia, CKD-3, who presents with shortness of breath.  Patient hasdementia, and isa poor historian, therefore, most of the history is obtained by discussing the case with ED physician, per EMS report, and with the nursing staff.History is limited. Patient has been having shortness of breath for several days, which has been progressively worsening. Patient is not on oxygen at home, but requiring 4 L oxygen with oxygen saturation 100% in ED. Patient has dry cough, but denies chest pain. No nausea,vomiting, diarrhea, abdominal pain. Denies symptoms of UTI. Patient moves all extremities.  Interim history Admitted for resp failure/ COPD Exacerbation. Found to have elevated troponin with atrial flutter RVR, cardiology consulted. On 07/08/2019, patient developed a junctional rhythm with a sinus pause and became bradycardic and was given atropine. She was then transferred to the ICU and placed on a dopamine drip given her hypotension. PCCM  was consulted.Dopamine drip has been discontinued. Patient was transferred back to progressive. Patient did become tachycardic. Cardiology would like for EP to evaluate patient, which will be done on 07/14/2019.  7/1.  Patient has been evaluated by electrophysiology, recommended switch to Toprol at same dose.  Patient does not need pacemaker at this time.  She will be followed by cardiology in the near future.  #1. Acute hypoxemic respiratory failure due to COPD exacerbation and congestive heart failure. Condition had improved.  Patient has been off oxygen.  2.  Acute on chronic combined systolic and diastolic congestive heart failure. Condition has improved.  3.  Paroxysmal atrial fibrillation with RVR. Condition has improved.  Continue Xarelto per cardiology recommendation adjusted to renal dosing.   4.  Sinus bradycardia with long pauses.  Possible sick sinus syndrome. Patient is evaluated by electrophysiology today.  No need for pacemaker.  5.  Hypotension. Blood pressure stable, lisinopril discontinued.  Currently stable on beta-blocker.  6.  Chronic kidney disease stage IIIa with acute kidney injury. Renal function at baseline.  7.  Dementia.  Stable per  8.  Type 2 diabetes. Continue current regimen.  Consults: cardiology  Significant Diagnostic Studies:    Treatments: Antibiotics and steroids.  Discharge Exam: Blood pressure 114/79, pulse (!) 118, temperature 98.6 F (37 C), temperature source Oral, resp. rate 18, height 5\' 4"  (1.626 m), weight 94.9 kg, SpO2 100 %. General appearance: alert, cooperative and Confused. Resp: clear to auscultation bilaterally Cardio: Irregular no murmurs GI: soft, non-tender; bowel sounds normal; no masses,  no organomegaly Extremities: extremities normal, atraumatic, no cyanosis or edema  Disposition: Discharge disposition: 03-Skilled Nursing Facility       Discharge Instructions    Diet - low sodium heart healthy  Complete by: As directed    Increase activity slowly   Complete by: As directed      Allergies as of 07/14/2019   No Known Allergies     Medication List    STOP taking these medications   aluminum-magnesium hydroxide-simethicone 200-200-20 MG/5ML Susp Commonly known as: MAALOX   amLODipine 10 MG tablet Commonly known as: NORVASC   bismuth subsalicylate 676 HM/09OB suspension Commonly known as: PEPTO BISMOL   [employer] Tussin Cough/Cold CF 5-10-100 MG/5ML Liqd Generic drug: Phenylephrine-DM-GG   Glucophage 500 MG tablet Generic drug: metFORMIN   lisinopril 10 MG tablet Commonly known as: ZESTRIL   metoprolol tartrate 25 MG tablet Commonly known as: LOPRESSOR     TAKE these medications   acetaminophen 325 MG tablet Commonly known as: TYLENOL Take 650 mg by mouth every 4 (four) hours as needed for mild pain or fever.   Advair Diskus 250-50 MCG/DOSE Aepb Generic drug: Fluticasone-Salmeterol Inhale 1 puff into the lungs 2 (two) times daily.   ALPRAZolam 0.25 MG tablet Commonly known as: XANAX Take 1 tablet (0.25 mg total) by mouth 2 (two) times daily.   aspirin 325 MG tablet Take 325 mg by mouth daily.   atorvastatin 20 MG tablet Commonly known as: LIPITOR Take 20 mg by mouth at bedtime.   ferrous sulfate 325 (65 FE) MG tablet Take 325 mg by mouth 2 (two) times daily with a meal.   fluticasone 50 MCG/ACT nasal spray Commonly known as: FLONASE Place 2 sprays into both nostrils daily.   insulin glargine 100 UNIT/ML injection Commonly known as: LANTUS Inject 0.1 mLs (10 Units total) into the skin daily.   levalbuterol 0.63 MG/3ML nebulizer solution Commonly known as: XOPENEX Take 3 mLs (0.63 mg total) by nebulization every 8 (eight) hours.   magnesium hydroxide 400 MG/5ML suspension Commonly known as: MILK OF MAGNESIA Take 30 mLs by mouth daily as needed for moderate constipation.   metoprolol succinate 50 MG 24 hr tablet Commonly known as: Toprol XL Take 1  tablet (50 mg total) by mouth in the morning and at bedtime. Take with or immediately following a meal.   modafinil 100 MG tablet Commonly known as: PROVIGIL Take 1 tablet (100 mg total) by mouth daily.   Namzaric 28-10 MG Cp24 Generic drug: Memantine HCl-Donepezil HCl Take 1 capsule by mouth daily.   omeprazole 40 MG capsule Commonly known as: PRILOSEC Take 40 mg by mouth daily.   perphenazine 4 MG tablet Commonly known as: TRILAFON Take 1 tablet (4 mg total) by mouth 3 (three) times daily.   QUEtiapine 200 MG tablet Commonly known as: SEROQUEL Take 200 mg by mouth at bedtime.   rivaroxaban 20 MG Tabs tablet Commonly known as: XARELTO Take 1 tablet (20 mg total) by mouth daily with supper.       Contact information for follow-up providers    Bender, Durene Cal, MD Follow up in 1 week(s).   Specialty: Family Medicine Contact information: [place] [address] [phone]        Wellington [place], MD Follow up in 2 week(s).   Specialty: Cardiology Contact information: [place] [address] [phone]            Contact information for after-discharge care    Destination    [facility] Preferred SNF .   Service: Skilled Chiropodist information: [address] [place] [phone]  More than 30 minutes Signed: Sharen Hones 07/14/2019, 11:33 AM

## 2019-07-14 NOTE — Progress Notes (Signed)
Progress Note  Patient Name: Cheryl [name] Date of Encounter: 07/14/2019  [facility] Cardiologist: Kathlyn Sacramento, MD   Subjective   No complaints, Reports she is hungry, needs help eating her breakfast Feels her breathing is close to her baseline Difficult to interpret what she is saying, stutters, repeats herself  Inpatient Medications    Scheduled Meds: . ALPRAZolam  0.25 mg Oral BID  . atorvastatin  20 mg Oral QHS  . Chlorhexidine Gluconate Cloth  6 each Topical Daily  . dextromethorphan-guaiFENesin  1 tablet Oral BID  . memantine  28 mg Oral Daily   And  . donepezil  10 mg Oral QHS  . ferrous sulfate  325 mg Oral BID WC  . fluticasone  2 spray Each Nare Daily  . insulin aspart  0-9 Units Subcutaneous TID WC  . insulin glargine  7 Units Subcutaneous Daily  . levalbuterol  0.63 mg Nebulization Q8H  . metoprolol tartrate  25 mg Oral Q6H  . modafinil  100 mg Oral Daily  . mometasone-formoterol  2 puff Inhalation BID  . pantoprazole  40 mg Oral Daily  . perphenazine  4 mg Oral TID  . QUEtiapine  200 mg Oral QHS  . rivaroxaban  20 mg Oral Q supper  . sodium chloride flush  10-40 mL Intracatheter Q12H   Continuous Infusions:  PRN Meds: acetaminophen, albuterol, alum & mag hydroxide-simeth, magnesium hydroxide, ondansetron (ZOFRAN) IV, sodium chloride flush   Vital Signs    Vitals:   07/14/19 0828 07/14/19 1101 07/14/19 1552 07/14/19 1636  BP:  114/79  (!) 138/100  Pulse:  97  (!) 112  Resp:  18  20  Temp:  98.6 F (37 C)  98.9 F (37.2 C)  TempSrc:  Oral  Oral  SpO2: 99% 100% 100% 100%  Weight:      Height:        Intake/Output Summary (Last 24 hours) at 07/14/2019 1912 Last data filed at 07/14/2019 1132 Gross per 24 hour  Intake 240 ml  Output 450 ml  Net -210 ml   Last 3 Weights 07/14/2019 07/13/2019 07/12/2019  Weight (lbs) 209 lb 3.2 oz 209 lb 209 lb 3.2 oz  Weight (kg) 94.892 kg 94.802 kg 94.892 kg      Telemetry    Atrial fibrillation,  personally Reviewed  ECG     - Personally Reviewed  Physical Exam   GEN: No acute distress.  Obese Neck:  Unable to estimate JVD Cardiac:  Irregularly irregular, no murmurs, rubs, or gallops.  Respiratory: Clear to auscultation bilaterally. GI: Soft, nontender, non-distended  MS: No edema; No deformity. Neuro:  Nonfocal , full exam not performed Psych: Normal affect   Labs    High Sensitivity Troponin:   Recent Labs  Lab 07/03/19 0947 07/03/19 1147 07/03/19 1526 07/03/19 1918  TROPONINIHS 50* 53* 42* 43*      Chemistry Recent Labs  Lab 07/11/19 0705 07/12/19 0553 07/13/19 0605  NA 140 142 140  K 4.3 4.3 4.0  CL 104 104 102  CO2 28 31 30   GLUCOSE 99 95 126*  BUN 30* 23 24*  CREATININE 1.34* 1.31* 1.30*  CALCIUM 8.8* 8.7* 8.7*  GFRNONAA 38* 39* 40*  GFRAA 44* 45* 46*  ANIONGAP 8 7 8      Hematology Recent Labs  Lab 07/09/19 0438 07/10/19 0753 07/13/19 0605  WBC 9.9 6.5 5.7  RBC 3.96 3.68* 4.05  HGB 10.8* 10.2* 11.1*  HCT 33.9* 31.8* 36.2  MCV 85.6 86.4  89.4  MCH 27.3 27.7 27.4  MCHC 31.9 32.1 30.7  RDW 16.4* 16.9* 17.1*  PLT 379 315 300    BNPNo results for input(s): BNP, PROBNP in the last 168 hours.   DDimer No results for input(s): DDIMER in the last 168 hours.   Radiology    No results found.  Cardiac Studies   Echocardiogram dated July 04, 2019 1. Left ventricular ejection fraction, by estimation, is 30 to 35%. The  left ventricle has moderately decreased function. The left ventricle  demonstrates global hypokinesis. There is mild left ventricular  hypertrophy. Left ventricular diastolic  parameters are indeterminate.  2. Right ventricular systolic function is mildly reduced. The right  ventricular size is mildly enlarged. There is mildly elevated pulmonary  artery systolic pressure.  3. Left atrial size was mildly dilated.  4. Right atrial size was moderately dilated.  5. The mitral valve is normal in structure. Trivial  mitral valve  regurgitation. No evidence of mitral stenosis.  6. Tricuspid valve regurgitation is moderate.  7. The aortic valve is normal in structure. Aortic valve regurgitation is  trivial. Mild aortic valve sclerosis is present, with no evidence of  aortic valve stenosis.  8. Moderately dilated pulmonary artery.   Patient Profile     79 y.o. female with history of DM2, HTN, HLD, cognitive impairment/dementia, COPD, CKD stage III, lung cancer s/p radiation therapy, iron deficiency anemia, depression, anxiety, and GERDwho is being seen today for the evaluation of acute systolic CHFand atrial flutter with RVR with subsequent junctional rhythm and hypotension.   Assessment & Plan    A/P: 1.  A. fib flutter with RVR Was admitted in atrial flutter, placed on IV amiodarone despite stroke risk given limited treatment options, had termination pause July 08, 2019 followed by junctional rhythm, hypotension requiring atropine, dopamine infusion in the ICU -Transferred back to unit, now back in atrial fibrillation rate controlled -On metoprolol CHDS VASC 6 On Xarelto 15 mg daily, creatinine clearance of 40 -Case discussed with EP, continue medical management, no antiarrhythmics at this time  2.HFrEF Dilated IVC, moderately elevated right heart pressures on echo 10 days ago Appears to be close to her baseline Needs close outpatient follow-up Will need Lasix for 3 pound weight gain  3.  Posttermination course after amiodarone use. Discussed with EP, no further antiarrhythmics at this time We will aim for rate control in atrial fibrillation or flutter if needed  4.  Dementia Has 3 children, Social worker to get in touch with disposition and patient support  Case discussed with EP in detail  Total encounter time more than 35 minutes  Greater than 50% was spent in counseling and coordination of care with the patient   For questions or updates, please contact [place] Please  consult [email or web address] for contact info under        Signed, Ida Rogue, MD  07/14/2019, 7:12 PM

## 2019-07-14 NOTE — Progress Notes (Signed)
Patient D/C from 2A 238 via EMS,

## 2019-07-14 NOTE — Care Management Important Message (Signed)
Important Message  Patient Details  Name: Cheryl [name] MRN: [id number] Date of Birth: 1941-01-13   Medicare Important Message Given:  Yes     [name] 07/14/2019, 2:00 PM

## 2019-07-14 NOTE — Progress Notes (Signed)
PROGRESS NOTE    Cheryl [name]  WUJ:[id number] DOB: [birth year] DOA: 07/03/2019 PCP: Letta Median, MD   Chief complaint.  Shortness of breath.  Brief Narrative: HPI on 07/03/2019 by Dr. Mertha Finders Carveris a 79 y.o.femalewith medical history significant ofhypertension, hyperlipidemia, diabetes mellitus, COPD, GERD, depression, anxiety, lung cancer (s/p of radiation therapy), dementia, iron deficiency anemia, CKD-3, who presents with shortness of breath.  Patient hasdementia, and isa poor historian, therefore, most of the history is obtained by discussing the case with ED physician, per EMS report, and with the nursing staff.History is limited. Patient has been having shortness of breath for several days, which has been progressively worsening. Patient is not on oxygen at home, but requiring 4 L oxygen with oxygen saturation 100% in ED. Patient has dry cough, but denies chest pain. No nausea,vomiting, diarrhea, abdominal pain. Denies symptoms of UTI. Patient moves all extremities.  Interim history Admitted for resp failure/ COPD Exacerbation. Found to have elevated troponin with atrial flutter RVR, cardiology consulted. On 07/08/2019, patient developed a junctional rhythm with a sinus pause and became bradycardic and was given atropine. She was then transferred to the ICU and placed on a dopamine drip given her hypotension. PCCM was consulted.Dopamine drip has been discontinued. Patient was transferred back to progressive. Patient did become tachycardic. Cardiology would like for EP to evaluate patient, which will be done on 07/14/2019.    Assessment & Plan:   Principal Problem:   COPD exacerbation ([place]) Active Problems:   HTN (hypertension)   HLD (hyperlipidemia)   Dementia (HCC)   Type II diabetes mellitus with renal manifestations (HCC)   Elevated troponin   Acute on chronic respiratory failure with hypoxia (HCC)   Acute renal failure superimposed on  stage 3a chronic kidney disease (HCC)   Persistent atrial fibrillation (HCC)   Systolic heart failure (HCC)   Atrial flutter ([place])   Sinus node dysfunction ([place])  #1. Acute hypoxemic respiratory failure due to COPD is off sedation and congestive heart failure. Condition had improved.  Patient has been off oxygen.  2.  Acute on chronic combined systolic and diastolic congestive heart failure. Condition has improved.  3.  Paroxysmal atrial fibrillation with RVR. Condition has improved.  Continue Xarelto.  4.  Sinus bradycardia with long pauses.  Possible sick sinus syndrome. Patient will be seen by electrophysiology today.  5.  Hypotension. Blood pressure stable midodrine.  6.  Chronic kidney disease stage IIIa with acute kidney injury. Renal function to baseline.  7.  Dementia.  Stable per  8.  Type 2 diabetes. Continue current regimen.   DVT prophylaxis: Xarelto. Code Status: Full Family Communication: None Disposition Plan:   Patient came from:                                                                                                                          Anticipated d/c place: SNF  Barriers to d/c OR conditions which need to be met to effect  a safe d/c: Likely discharge to nursing home tomorrow after seen by electrophysiology today.  Consultants:   Cardiology  Procedures:None Antimicrobials:   None   Subjective: Patient has some confusion, otherwise no distress.  Not short of breath.  No abdominal pain or nausea vomiting.  Objective: Vitals:   07/14/19 0025 07/14/19 0352 07/14/19 0727 07/14/19 0828  BP:  (!) 172/85 (!) 150/80   Pulse:  (!) 59 (!) 113   Resp: _0 Temp:  98.7 F (37.1 C) 97.9 F (36.6 C)   TempSrc:  Oral    SpO2:  99% 100% 99%  Weight:  94.9 kg    Height:        Intake/Output Summary (Last 24 hours) at 07/14/2019 1023 Last data filed at 07/14/2019 0950 Gross per 24 hour  Intake 240 ml  Output 0 ml  Net 240 ml    Filed Weights   07/12/19 0423 07/13/19 0453 07/14/19 0352  Weight: 94.9 kg 94.8 kg 94.9 kg    Examination:  General exam: Appears calm and comfortable  Respiratory system: Decreased breathing sounds. Respiratory effort normal. Cardiovascular system: Irregular.  No JVD, murmurs, rubs, gallops or clicks. No pedal edema. Gastrointestinal system: Abdomen is nondistended, soft and nontender. No organomegaly or masses felt. Normal bowel sounds heard. Central nervous system: Alert and confused. No focal neurological deficits. Extremities: Symmetric  Skin: No rashes, lesions or ulcers Psychiatry: Confused    Data Reviewed: I have personally reviewed following labs and imaging studies  CBC: Recent Labs  Lab 07/09/19 0438 07/10/19 0753 07/13/19 0605  WBC 9.9 6.5 5.7  HGB 10.8* 10.2* 11.1*  HCT 33.9* 31.8* 36.2  MCV 85.6 86.4 89.4  PLT 379 315 149   Basic Metabolic Panel: Recent Labs  Lab 07/08/19 0625 07/08/19 0625 07/09/19 0438 07/10/19 0537 07/11/19 0705 07/12/19 0553 07/13/19 0605  NA 138   < > 135 139 140 142 140  K 3.8   < > 4.5 3.8 4.3 4.3 4.0  CL 102   < > 101 107 104 104 102  CO2 27   < > _1 GLUCOSE 116*   < > 208* 67* 99 95 126*  BUN 52*   < > 60* 44* 30* 23 24*  CREATININE 1.49*   < > 1.88* 1.55* 1.34* 1.31* 1.30*  CALCIUM 8.5*   < > 8.9 8.3* 8.8* 8.7* 8.7*  MG 1.8  --   --   --   --   --   --    < > = values in this interval not displayed.   GFR: Estimated Creatinine Clearance: 40.5 mL/min (A) (by C-G formula based on SCr of 1.3 mg/dL (H)). Liver Function Tests: No results for input(s): AST, ALT, ALKPHOS, BILITOT, PROT, ALBUMIN in the last 168 hours. No results for input(s): LIPASE, AMYLASE in the last 168 hours. No results for input(s): AMMONIA in the last 168 hours. Coagulation Profile: No results for input(s): INR, PROTIME in the last 168 hours. Cardiac Enzymes: No results for input(s): CKTOTAL, CKMB, CKMBINDEX, TROPONINI in the last  168 hours. BNP (last 3 results) No results for input(s): PROBNP in the last 8760 hours. HbA1C: No results for input(s): HGBA1C in the last 72 hours. CBG: Recent Labs  Lab 07/13/19 0757 07/13/19 1133 07/13/19 1630 07/13/19 2124 07/14/19 0730  GLUCAP 106* 97 136* 151* 100*   Lipid Profile: No results for input(s): CHOL, HDL, LDLCALC, TRIG, CHOLHDL, LDLDIRECT in the last 72 hours.  Thyroid Function Tests: No results for input(s): TSH, T4TOTAL, FREET4, T3FREE, THYROIDAB in the last 72 hours. Anemia Panel: No results for input(s): VITAMINB12, FOLATE, FERRITIN, TIBC, IRON, RETICCTPCT in the last 72 hours. Sepsis Labs: No results for input(s): PROCALCITON, LATICACIDVEN in the last 168 hours.  Recent Results (from the past 240 hour(s))  MRSA PCR Screening     Status: None   Collection Time: 07/04/19 12:20 PM   Specimen: Nasopharyngeal  Result Value Ref Range Status   MRSA by PCR NEGATIVE NEGATIVE Final    Comment:        The GeneXpert MRSA Assay (FDA approved for NASAL specimens only), is one component of a comprehensive MRSA colonization surveillance program. It is not intended to diagnose MRSA infection nor to guide or monitor treatment for MRSA infections. Performed at [facility], [address] [place], [name] [address]   MRSA PCR Screening     Status: None   Collection Time: 07/08/19  4:00 PM   Specimen: Nasal Mucosa; Nasopharyngeal  Result Value Ref Range Status   MRSA by PCR NEGATIVE NEGATIVE Final    Comment:        The GeneXpert MRSA Assay (FDA approved for NASAL specimens only), is one component of a comprehensive MRSA colonization surveillance program. It is not intended to diagnose MRSA infection nor to guide or monitor treatment for MRSA infections. Performed at [facility], [address] [place], [place] [address]          Radiology Studies: No results found.      Scheduled Meds: . ALPRAZolam  0.25 mg Oral BID   . atorvastatin  20 mg Oral QHS  . Chlorhexidine Gluconate Cloth  6 each Topical Daily  . dextromethorphan-guaiFENesin  1 tablet Oral BID  . memantine  28 mg Oral Daily   And  . donepezil  10 mg Oral QHS  . ferrous sulfate  325 mg Oral BID WC  . fluticasone  2 spray Each Nare Daily  . insulin aspart  0-9 Units Subcutaneous TID WC  . insulin glargine  7 Units Subcutaneous Daily  . levalbuterol  0.63 mg Nebulization Q8H  . metoprolol tartrate  25 mg Oral Q6H  . modafinil  100 mg Oral Daily  . mometasone-formoterol  2 puff Inhalation BID  . pantoprazole  40 mg Oral Daily  . perphenazine  4 mg Oral TID  . QUEtiapine  200 mg Oral QHS  . rivaroxaban  20 mg Oral Q supper  . sodium chloride flush  10-40 mL Intracatheter Q12H   Continuous Infusions:   LOS: 11 days    Time spent: 28 minutes    Sharen Hones, MD [facility]   To contact the attending provider between 7A-7P or the covering provider during after hours 7P-7A, please log into the web site [email or web address] and access using universal  password for that web site. If you do not have the password, please call the hospital operator.  07/14/2019, 10:23 AM

## 2019-07-14 NOTE — Consult Note (Signed)
ELECTROPHYSIOLOGY CONSULT NOTE  Patient ID: Cheryl [name], MRN: [id number], DOB/AGE: November 17, 1940 79 y.o. Admit date: 07/03/2019 Date of Consult: 07/14/2019  Primary Physician: Letta Median, MD Primary Cardiologist: Cheryl [name] is a 79 y.o. female who is being seen today for the evaluation of  Tachybrady syndrome at the request of **BAE/CE*.   Chief Complaint:  Non conversant, history taking from the chart   HPI Cheryl [name] is a 79 y.o. female seen for atrial flutter/fib RVR  1 had presented to hospital 07/04/2019 with shortness of breath and hypoxia.  Found to be in atrial flutter.  CTA was negative.  BNP was elevated.  Found to have a modest cardiomyopathy EF as noted below.  Initial efforts for rate control with calcium blockers and diltiazem were unsuccessful.  It was elected to treat her with amiodarone amio IV>>PO; this was stopped after post termination pause ( >5 sec)  and junctional bradycardia and hypotension prompting support with dopamine and atropine and  the holding of metop and losartan    Developed recurrent atrial flutter with progressively slower atrial cycle lengths.  Metoprolol was resumed.   anticoagulation with Apixoban    Troponins were elevated but the curve is flat.  Was elected not to pursue this given her dementia.  DATE TEST EF   6/21 Echo   30-35 % Mild RV dysfunction        Date Cr K Hgb  6/21 1.30<<1.93 4.0 50.2     Thromboembolic risk factors ( age  -2 , HTN-1, DM-1, CHF-1, Gender-1) for a CHADSVASc Score of >=6   Hx of lung Ca with prior XRT Lives in group home 2/2 cognitive impairment--son is guardian   Past Medical History:  Diagnosis Date  . COPD (chronic obstructive pulmonary disease) ([place])   . Dementia ([place])   . Diabetes mellitus without complication ([place])   . GERD (gastroesophageal reflux disease)   . Hyperlipidemia   . Hypertension   . Lung cancer ([place])   . Mental retardation   . Schizophrenia [facility])        Surgical History:  Past Surgical History:  Procedure Laterality Date  . ABDOMINAL HYSTERECTOMY       Home Meds: Prior to Admission medications   Medication Sig Start Date End Date Taking? Authorizing Provider  acetaminophen (TYLENOL) 325 MG tablet Take 650 mg by mouth every 4 (four) hours as needed for mild pain or fever.    Yes [provider]  ALPRAZolam (XANAX) 0.25 MG tablet Take 1 tablet (0.25 mg total) by mouth 2 (two) times daily. 05/15/19  Yes Swayze, Ava, DO  aluminum-magnesium hydroxide-simethicone (MAALOX) 774-128-78 MG/5ML SUSP Take 30 mLs by mouth daily as needed (indegestion or heartburn).    Yes [provider]  amLODipine (NORVASC) 10 MG tablet Take 10 mg by mouth daily.    Yes [provider]  aspirin 325 MG tablet Take 325 mg by mouth daily.    Yes [provider]  atorvastatin (LIPITOR) 20 MG tablet Take 20 mg by mouth at bedtime.    Yes [provider]  bismuth subsalicylate (PEPTO BISMOL) 262 MG/15ML suspension Take 15 mLs by mouth every 2 (two) hours as needed for indigestion or diarrhea or loose stools. (max 6 doses in 24 hours)   Yes [provider]  ferrous sulfate 325 (65 FE) MG tablet Take 325 mg by mouth 2 (two) times daily with a meal.    Yes [provider]  fluticasone (FLONASE) 50  MCG/ACT nasal spray Place 2 sprays into both nostrils daily.   Yes [provider]  Fluticasone-Salmeterol (ADVAIR DISKUS) 250-50 MCG/DOSE AEPB Inhale 1 puff into the lungs 2 (two) times daily.   Yes [provider]  insulin glargine (LANTUS) 100 UNIT/ML injection Inject 0.1 mLs (10 Units total) into the skin daily. 05/17/19  Yes Bonnell Public Tublu, MD  lisinopril (PRINIVIL,ZESTRIL) 10 MG tablet Take 10 mg by mouth daily.    Yes [provider]  magnesium hydroxide (MILK OF MAGNESIA) 400 MG/5ML suspension Take 30 mLs by mouth daily as needed for moderate constipation.    Yes [provider]  Memantine HCl-Donepezil HCl (NAMZARIC) 28-10 MG CP24 Take 1 capsule by mouth daily. 05/15/19  Yes Swayze, Ava, DO  metFORMIN (GLUCOPHAGE) 500 MG tablet Take 500 mg by mouth 2 (two) times daily with a meal.    Yes [provider]  metoprolol tartrate (LOPRESSOR) 25 MG tablet Take 1 tablet (25 mg total) by mouth 2 (two) times daily. 05/15/19  Yes Swayze, Ava, DO  modafinil (PROVIGIL) 100 MG tablet Take 1 tablet (100 mg total) by mouth daily. 05/15/19  Yes Swayze, Ava, DO  omeprazole (PRILOSEC) 40 MG capsule Take 40 mg by mouth daily.    Yes [provider]  perphenazine (TRILAFON) 4 MG tablet Take 1 tablet (4 mg total) by mouth 3 (three) times daily. 05/15/19  Yes Swayze, Ava, DO  Phenylephrine-DM-GG ([employer] TUSSIN COUGH/COLD CF) 5-10-100 MG/5ML LIQD Take 10 mLs by mouth every 6 (six) hours as needed (cough).   Yes [provider]  QUEtiapine (SEROQUEL) 200 MG tablet Take 200 mg by mouth at bedtime.   Yes [provider]    Inpatient Medications:  . ALPRAZolam  0.25 mg Oral BID  . atorvastatin  20 mg Oral QHS  . Chlorhexidine Gluconate Cloth  6 each Topical Daily  . dextromethorphan-guaiFENesin  1 tablet Oral BID  . memantine  28 mg Oral Daily   And  . donepezil  10 mg Oral QHS  . ferrous sulfate  325 mg Oral BID WC  . fluticasone  2 spray Each Nare Daily  . insulin aspart  0-9 Units Subcutaneous TID WC  . insulin glargine  7 Units Subcutaneous Daily  . levalbuterol  0.63 mg Nebulization Q8H  . metoprolol tartrate  25 mg Oral Q6H  . midodrine  10 mg Oral TID WC  . modafinil  100 mg Oral Daily  . mometasone-formoterol  2 puff Inhalation BID  . pantoprazole  40 mg Oral Daily  . perphenazine  4 mg Oral TID  . QUEtiapine  200 mg Oral QHS  . rivaroxaban  20 mg Oral Q supper  . sodium chloride flush  10-40 mL Intracatheter Q12H      Allergies: No Known Allergies  Social History   Socioeconomic History  . Marital status: Single    Spouse name:  Not on file  . Number of children: Not on file  . Years of education: Not on file  . Highest education level: Not on file  Occupational History  . Not on file  Tobacco Use  . Smoking status: Former Smoker    Packs/day: 0.25    Types: Cigarettes    Quit date: 08/30/2016    Years since quitting: 2.8  . Smokeless tobacco: Never Used  Vaping Use  . Vaping Use: Never used  Substance and Sexual Activity  . Alcohol use: No  . Drug use: No  . Sexual activity: Never  Other Topics Concern  . Not on file  Social History Narrative  . Not on file   Social Determinants of Health   Financial Resource Strain:   . Difficulty of Paying Living Expenses:   Food Insecurity:   . Worried About Charity fundraiser in the Last Year:   . Arboriculturist in the Last Year:   Transportation Needs:   . Film/video editor (Medical):   Marland Kitchen Lack of Transportation (Non-Medical):   Physical Activity:   . Days of Exercise per Week:   . Minutes of Exercise per Session:   Stress:   . Feeling of Stress :   Social Connections:   . Frequency of Communication with Friends and Family:   . Frequency of Social Gatherings with Friends and Family:   . Attends Religious Services:   . Active Member of Clubs or Organizations:   . Attends Archivist Meetings:   Marland Kitchen Marital Status:   Intimate Partner Violence:   . Fear of Current or Ex-Partner:   . Emotionally Abused:   Marland Kitchen Physically Abused:   . Sexually Abused:      Family History  Problem Relation Age of Onset  . Breast cancer Neg Hx      ROS:  Please see the history of present illness.     All other systems reviewed and negative.    Physical Exam:  Blood pressure (!) 150/80, pulse (!) 113, temperature 97.9 F (36.6 C), resp. rate 18, height 5\' 4"  (1.626 m), weight 94.9 kg, SpO2 100 %. General: Well developed, well nourished female in no acute distress. Head: Normocephalic, atraumatic, sclera non-icteric, no xanthomas, nares are without  discharge. EENT: normal Lymph Nodes:  none Back: without scoliosis/kyphosis , no CVA tendersness Neck: Negative for carotid bruits. JVD not elevated. Lungs: Clear bilaterally to auscultation without wheezes, rales, or rhonchi. Breathing is unlabored. Heart: Irregularly irregular rate and rhythm with a 2/6 murmur , rubs, or gallops appreciated. Abdomen: Soft, non-tender, non-distended with normoactive bowel sounds. No hepatomegaly. No rebound/guarding. No obvious abdominal masses. Msk:  Strength and tone appear normal for age. Extremities: No clubbing or cyanosis. tr edema.  Distal pedal pulses are 2+ and equal bilaterally. Skin: Warm and Dry Neuro: Alert and oriented X 3. CN III-XII intact Grossly normal sensory and motor function . Psych:  Responds to questions appropriately with a normal affect.      Labs: Cardiac Enzymes No results for input(s): CKTOTAL, CKMB, TROPONINI in the last 72 hours. CBC Lab Results  Component Value Date   WBC 5.7 07/13/2019   HGB 11.1 (L) 07/13/2019   HCT 36.2 07/13/2019   MCV 89.4 07/13/2019   PLT 300 07/13/2019   PROTIME: No results for input(s): LABPROT, INR in the last 72 hours. Chemistry  Recent Labs  Lab 07/13/19 0605  NA 140  K 4.0  CL 102  CO2 30  BUN 24*  CREATININE 1.30*  CALCIUM 8.7*  GLUCOSE 126*   Lipids Lab Results  Component Value Date   CHOL 157 07/04/2019   HDL 66 07/04/2019   LDLCALC 80 07/04/2019   TRIG 55 07/04/2019   BNP No results found for: PROBNP Thyroid Function Tests: No results for input(s): TSH, T4TOTAL, T3FREE, THYROIDAB in the last 72 hours.  Invalid input(s): FREET3    Miscellaneous No results found for: DDIMER  Radiology/Studies:  DG Chest 2 View  Result Date: 07/03/2019 CLINICAL DATA:  Shortness of breath and hypoxia. History of lung cancer post radiation.  EXAM: CHEST - 2 VIEW COMPARISON:  05/09/2019 and CT 05/09/2019 FINDINGS: Patient slightly rotated to the left. Lungs are adequately  inflated without focal airspace consolidation, effusion or pneumothorax. Known left perihilar scarring in this patient with history of lung cancer post radiation. Mild stable cardiomegaly. Remainder of the exam is unchanged. Minimal linear scarring over the left upper lung. IMPRESSION: 1.  No acute findings. 2. Mild stable cardiomegaly. Stable scarring over the left hilar region in this patient with history of lung cancer post radiation. Electronically Signed   By: Marin Olp M.D.   On: 07/03/2019 11:01   CT Angio Chest PE W and/or Wo Contrast  Result Date: 07/03/2019 CLINICAL DATA:  Shortness of breath.  Wheezing.  Tachycardia. EXAM: CT ANGIOGRAPHY CHEST WITH CONTRAST TECHNIQUE: Multidetector CT imaging of the chest was performed using the standard protocol during bolus administration of intravenous contrast. Multiplanar CT image reconstructions and MIPs were obtained to evaluate the vascular anatomy. CONTRAST:  59mL OMNIPAQUE IOHEXOL 350 MG/ML SOLN COMPARISON:  CT scan dated 05/09/2019 FINDINGS: Cardiovascular: Satisfactory opacification of the pulmonary arteries to the segmental level. No evidence of pulmonary embolism. Borderline cardiomegaly. Trace pericardial effusion. Aortic atherosclerosis. Scattered coronary artery calcifications. Mediastinum/Nodes: There is bronchomalacia of the mainstem bronchi bilaterally. Trachea appears normal. Esophagus is normal. Large chronic hiatal hernia. Lungs/Pleura: Stable parenchymal scarring anteromedially in the right hemithorax and in the left perihilar region and at the left lung base, unchanged. Tiny new right effusion. Upper Abdomen: No acute abnormality. Stable 17 mm low-density lesion in the right lobe of the liver consistent with a cyst. Musculoskeletal: No chest wall abnormality. No acute or significant osseous findings. Review of the MIP images confirms the above findings. IMPRESSION: 1. No pulmonary emboli. 2. Bronchomalacia of mainstem bronchi bilaterally.  Both mainstem bronchi are markedly narrowed. 3. Tiny new right effusion. 4. Stable bilateral parenchymal scarring. 5. Large chronic hiatal hernia. 6.  Aortic Atherosclerosis (ICD10-I70.0). Aortic Atherosclerosis (ICD10-I70.0). Electronically Signed   By: Lorriane Shire M.D.   On: 07/03/2019 13:05   ECHOCARDIOGRAM COMPLETE  Result Date: 07/04/2019    ECHOCARDIOGRAM REPORT   Patient Name:   Cheryl [name] Date of Exam: 07/04/2019 Medical Rec #:  [id number]      Height:       64.0 in Accession #:    [id number]     Weight:       205.8 lb Date of Birth:  Mar 01, 1940      BSA:          1.980 m Patient Age:    72 years       BP:           134/87 mmHg Patient Gender: F              HR:           124 bpm. Exam Location:  [facility] Procedure: Color Doppler and Cardiac Doppler Indications:     I50.31 CHF-Acute Diastolic  History:         Patient has no prior history of Echocardiogram examinations.                  COPD; Risk Factors:Hypertension, Dyslipidemia and Diabetes.  Sonographer:     Charmayne Sheer RDCS (AE) Referring Phys:  Baker Janus Soledad Gerlach NIU Diagnosing Phys: Kathlyn Sacramento MD  Sonographer Comments: Suboptimal apical window and suboptimal subcostal window. Image acquisition challenging due to COPD. IMPRESSIONS  1. Left ventricular ejection fraction, by estimation, is 30 to 35%. The left ventricle has  moderately decreased function. The left ventricle demonstrates global hypokinesis. There is mild left ventricular hypertrophy. Left ventricular diastolic parameters are indeterminate.  2. Right ventricular systolic function is mildly reduced. The right ventricular size is mildly enlarged. There is mildly elevated pulmonary artery systolic pressure.  3. Left atrial size was mildly dilated.  4. Right atrial size was moderately dilated.  5. The mitral valve is normal in structure. Trivial mitral valve regurgitation. No evidence of mitral stenosis.  6. Tricuspid valve regurgitation is moderate.  7. The aortic valve is normal in structure.  Aortic valve regurgitation is trivial. Mild aortic valve sclerosis is present, with no evidence of aortic valve stenosis.  8. Moderately dilated pulmonary artery. FINDINGS  Left Ventricle: Left ventricular ejection fraction, by estimation, is 30 to 35%. The left ventricle has moderately decreased function. The left ventricle demonstrates global hypokinesis. The left ventricular internal cavity size was normal in size. There is mild left ventricular hypertrophy. Left ventricular diastolic parameters are indeterminate. Right Ventricle: The right ventricular size is mildly enlarged. No increase in right ventricular wall thickness. Right ventricular systolic function is mildly reduced. There is mildly elevated pulmonary artery systolic pressure. The tricuspid regurgitant  velocity is 2.95 m/s, and with an assumed right atrial pressure of 10 mmHg, the estimated right ventricular systolic pressure is 19.4 mmHg. Left Atrium: Left atrial size was mildly dilated. Right Atrium: Right atrial size was moderately dilated. Pericardium: There is no evidence of pericardial effusion. Mitral Valve: The mitral valve is normal in structure. Normal mobility of the mitral valve leaflets. Mild mitral annular calcification. Trivial mitral valve regurgitation. No evidence of mitral valve stenosis. MV peak gradient, 6.0 mmHg. The mean mitral valve gradient is 3.0 mmHg. Tricuspid Valve: The tricuspid valve is normal in structure. Tricuspid valve regurgitation is moderate . No evidence of tricuspid stenosis. Aortic Valve: The aortic valve is normal in structure. Aortic valve regurgitation is trivial. Mild aortic valve sclerosis is present, with no evidence of aortic valve stenosis. Aortic valve mean gradient measures 2.0 mmHg. Aortic valve peak gradient measures 2.9 mmHg. Aortic valve area, by VTI measures 2.98 cm. Pulmonic Valve: The pulmonic valve was normal in structure. Pulmonic valve regurgitation is mild. No evidence of pulmonic  stenosis. Aorta: The aortic root is normal in size and structure. Pulmonary Artery: The pulmonary artery is moderately dilated. Venous: The inferior vena cava was not well visualized. IAS/Shunts: No atrial level shunt detected by color flow Doppler.  LEFT VENTRICLE PLAX 2D LVIDd:         4.31 cm  Diastology LVIDs:         3.75 cm  LV e' lateral:   8.38 cm/s LV PW:         1.25 cm  LV E/e' lateral: 14.8 LV IVS:        1.02 cm  LV e' medial:    5.11 cm/s LVOT diam:     2.00 cm  LV E/e' medial:  24.3 LV SV:         35 LV SV Index:   18 LVOT Area:     3.14 cm  RIGHT VENTRICLE RV Basal diam:  4.15 cm LEFT ATRIUM             Index       RIGHT ATRIUM           Index LA diam:        3.60 cm 1.82 cm/m  RA Area:     25.90 cm LA Vol (  A2C):   29.1 ml 14.69 ml/m RA Volume:   91.70 ml  46.31 ml/m LA Vol (A4C):   49.7 ml 25.10 ml/m LA Biplane Vol: 40.1 ml 20.25 ml/m  AORTIC VALVE                   PULMONIC VALVE AV Area (Vmax):    2.84 cm    PV Vmax:       0.66 m/s AV Area (Vmean):   2.71 cm    PV Vmean:      50.800 cm/s AV Area (VTI):     2.98 cm    PV VTI:        0.087 m AV Vmax:           84.60 cm/s  PV Peak grad:  1.8 mmHg AV Vmean:          61.400 cm/s PV Mean grad:  1.0 mmHg AV VTI:            0.117 m AV Peak Grad:      2.9 mmHg AV Mean Grad:      2.0 mmHg LVOT Vmax:         76.50 cm/s LVOT Vmean:        53.000 cm/s LVOT VTI:          0.111 m LVOT/AV VTI ratio: 0.95  AORTA Ao Root diam: 3.60 cm MITRAL VALVE                TRICUSPID VALVE MV Area (PHT): 6.88 cm     TR Peak grad:   34.8 mmHg MV Peak grad:  6.0 mmHg     TR Vmax:        295.00 cm/s MV Mean grad:  3.0 mmHg MV Vmax:       1.22 m/s     SHUNTS MV Vmean:      81.6 cm/s    Systemic VTI:  0.11 m MV Decel Time: 110 msec     Systemic Diam: 2.00 cm MV E velocity: 124.00 cm/s Kathlyn Sacramento MD Electronically signed by Kathlyn Sacramento MD Signature Date/Time: 07/04/2019/12:31:41 PM    Final    [place] EKG SITE RITE  Result Date: 07/08/2019 If Site Rite image not  attached, placement could not be confirmed due to current cardiac rhythm.   EKG:  4/21 sinus with freq PACs 5/21 Sinus with freq PACs; nonsustained atrial tach  07/03/19 Atrial flutter atypical 2:1 07/04/19  Atrial flutter atypical 2:1 07/13/19 Atrial flutter atypical 2:1 ( I think  Assessment and Plan:  Atrial flutter/atypica   Junctional rhythm  Hypertension   Dementia  Cardiomyopathy  Chronic kidney disease class III    Blood pressure has recovered.  We will stop the midodrine.  Risks related to a recurrent posttermination pauses are minimal as best as I can tell as I suspect she is largely nonambulatory  Still however, would favor a rate control strategy if blood pressure will tolerate.  She may benefit from adjunctive digoxin as her renal function has stabilized.  Atrial flutter rates are often difficult to control.  Somewhat surprisingly it has been easier in her.  I wonder if this is related to the exposure to amiodarone especially given the concomitant lengthening of her atrial cycle length.  (220--360 msec)  The other issue is ethical and social.  Input from her family to direct care given the question of more intensive therapies is essential.  She tells me she has 3 children.  Not withstanding  not been able to find her son who is her legal guardian, other family should be sought to help decision-making.   Cheryl [name]

## 2019-07-14 NOTE — Discharge Instructions (Signed)
Follow-up with PCP in 1 week. 2.  Follow-up with cardiology in 2 weeks.

## 2019-07-14 NOTE — TOC Transition Note (Signed)
Transition of Care [facility]) - CM/SW Discharge Note   Patient Details  Name: Cheryl [name] MRN: [id number] Date of Birth: Jan 25, 1940  [facility] CM/SW Contact:  Eileen Stanford, LCSW Phone Number: 07/14/2019, 1:48 PM   Clinical Narrative:    Clinical Social Worker facilitated patient discharge including contacting patient family and facility to confirm patient discharge plans.  Clinical information faxed to facility and family agreeable with plan.  CSW arranged ambulance transport via [facility] to [facility]  RN to call [phone] for report prior to discharge.  Final next level of care: Skilled Nursing Facility Barriers to Discharge: No Barriers Identified   Patient Goals and CMS Choice Patient states their goals for this hospitalization and ongoing recovery are:: for pt to get rehab   Choice offered to / list presented to : Adult Children (facility staff)  Discharge Placement              Patient chooses bed at: [facility] Patient to be transferred to facility by: [facility] Name of family member notified: tammy Patient and family notified of of transfer: 07/14/19  Discharge Plan and Services     Post Acute Care Choice: [place]                               Social Determinants of Health (SDOH) Interventions     Readmission Risk Interventions Readmission Risk Prevention Plan 05/10/2019  Transportation Screening Complete  PCP or Specialist Appt within 3-5 Days Complete  HRI or Home Care Consult Complete  Medication Review (RN Care Manager) Complete  Some recent data might be hidden

## 2019-07-15 ENCOUNTER — Emergency Department (HOSPITAL_COMMUNITY): Payer: Medicare Other

## 2019-07-15 ENCOUNTER — Other Ambulatory Visit: Payer: Self-pay

## 2019-07-15 ENCOUNTER — Encounter (HOSPITAL_COMMUNITY): Payer: Self-pay | Admitting: Internal Medicine

## 2019-07-15 DIAGNOSIS — R6250 Unspecified lack of expected normal physiological development in childhood: Secondary | ICD-10-CM | POA: Diagnosis present

## 2019-07-15 DIAGNOSIS — J9621 Acute and chronic respiratory failure with hypoxia: Secondary | ICD-10-CM | POA: Diagnosis present

## 2019-07-15 DIAGNOSIS — I484 Atypical atrial flutter: Secondary | ICD-10-CM | POA: Diagnosis not present

## 2019-07-15 DIAGNOSIS — K219 Gastro-esophageal reflux disease without esophagitis: Secondary | ICD-10-CM | POA: Diagnosis present

## 2019-07-15 DIAGNOSIS — Z87891 Personal history of nicotine dependence: Secondary | ICD-10-CM | POA: Diagnosis not present

## 2019-07-15 DIAGNOSIS — J9601 Acute respiratory failure with hypoxia: Secondary | ICD-10-CM

## 2019-07-15 DIAGNOSIS — R609 Edema, unspecified: Secondary | ICD-10-CM | POA: Diagnosis present

## 2019-07-15 DIAGNOSIS — I4819 Other persistent atrial fibrillation: Secondary | ICD-10-CM | POA: Diagnosis present

## 2019-07-15 DIAGNOSIS — N179 Acute kidney failure, unspecified: Secondary | ICD-10-CM | POA: Diagnosis present

## 2019-07-15 DIAGNOSIS — I4892 Unspecified atrial flutter: Secondary | ICD-10-CM

## 2019-07-15 DIAGNOSIS — E1122 Type 2 diabetes mellitus with diabetic chronic kidney disease: Secondary | ICD-10-CM | POA: Diagnosis present

## 2019-07-15 DIAGNOSIS — Z20822 Contact with and (suspected) exposure to covid-19: Secondary | ICD-10-CM | POA: Diagnosis present

## 2019-07-15 DIAGNOSIS — N1832 Chronic kidney disease, stage 3b: Secondary | ICD-10-CM | POA: Diagnosis present

## 2019-07-15 DIAGNOSIS — R627 Adult failure to thrive: Secondary | ICD-10-CM | POA: Diagnosis present

## 2019-07-15 DIAGNOSIS — I5023 Acute on chronic systolic (congestive) heart failure: Secondary | ICD-10-CM | POA: Diagnosis present

## 2019-07-15 DIAGNOSIS — D631 Anemia in chronic kidney disease: Secondary | ICD-10-CM | POA: Diagnosis present

## 2019-07-15 DIAGNOSIS — F0390 Unspecified dementia without behavioral disturbance: Secondary | ICD-10-CM | POA: Diagnosis present

## 2019-07-15 DIAGNOSIS — F79 Unspecified intellectual disabilities: Secondary | ICD-10-CM | POA: Diagnosis present

## 2019-07-15 DIAGNOSIS — F0391 Unspecified dementia with behavioral disturbance: Secondary | ICD-10-CM | POA: Diagnosis not present

## 2019-07-15 DIAGNOSIS — J441 Chronic obstructive pulmonary disease with (acute) exacerbation: Secondary | ICD-10-CM | POA: Diagnosis present

## 2019-07-15 DIAGNOSIS — E785 Hyperlipidemia, unspecified: Secondary | ICD-10-CM | POA: Diagnosis present

## 2019-07-15 DIAGNOSIS — I130 Hypertensive heart and chronic kidney disease with heart failure and stage 1 through stage 4 chronic kidney disease, or unspecified chronic kidney disease: Secondary | ICD-10-CM | POA: Diagnosis present

## 2019-07-15 DIAGNOSIS — I429 Cardiomyopathy, unspecified: Secondary | ICD-10-CM | POA: Diagnosis present

## 2019-07-15 DIAGNOSIS — F419 Anxiety disorder, unspecified: Secondary | ICD-10-CM | POA: Diagnosis present

## 2019-07-15 DIAGNOSIS — J9622 Acute and chronic respiratory failure with hypercapnia: Secondary | ICD-10-CM | POA: Diagnosis present

## 2019-07-15 DIAGNOSIS — I10 Essential (primary) hypertension: Secondary | ICD-10-CM | POA: Diagnosis not present

## 2019-07-15 DIAGNOSIS — Z85118 Personal history of other malignant neoplasm of bronchus and lung: Secondary | ICD-10-CM | POA: Diagnosis not present

## 2019-07-15 DIAGNOSIS — F209 Schizophrenia, unspecified: Secondary | ICD-10-CM | POA: Diagnosis present

## 2019-07-15 DIAGNOSIS — I5043 Acute on chronic combined systolic (congestive) and diastolic (congestive) heart failure: Secondary | ICD-10-CM | POA: Diagnosis present

## 2019-07-15 LAB — I-STAT ARTERIAL BLOOD GAS, ED
Acid-Base Excess: 3 mmol/L — ABNORMAL HIGH (ref 0.0–2.0)
Acid-Base Excess: 5 mmol/L — ABNORMAL HIGH (ref 0.0–2.0)
Bicarbonate: 31 mmol/L — ABNORMAL HIGH (ref 20.0–28.0)
Bicarbonate: 33 mmol/L — ABNORMAL HIGH (ref 20.0–28.0)
Calcium, Ion: 1.21 mmol/L (ref 1.15–1.40)
Calcium, Ion: 1.16 mmol/L (ref 1.15–1.40)
HCT: 38 % (ref 36.0–46.0)
HCT: 40 % (ref 36.0–46.0)
Hemoglobin: 12.9 g/dL (ref 12.0–15.0)
Hemoglobin: 13.6 g/dL (ref 12.0–15.0)
O2 Saturation: 98 %
O2 Saturation: 99 %
Patient temperature: 98.6
Patient temperature: 98.6
Potassium: 4.6 mmol/L (ref 3.5–5.1)
Potassium: 4.3 mmol/L (ref 3.5–5.1)
Sodium: 142 mmol/L (ref 135–145)
Sodium: 142 mmol/L (ref 135–145)
TCO2: 33 mmol/L — ABNORMAL HIGH (ref 22–32)
TCO2: 35 mmol/L — ABNORMAL HIGH (ref 22–32)
pCO2 arterial: 60.2 mmHg — ABNORMAL HIGH (ref 32.0–48.0)
pCO2 arterial: 65.4 mmHg (ref 32.0–48.0)
pH, Arterial: 7.319 — ABNORMAL LOW (ref 7.350–7.450)
pH, Arterial: 7.311 — ABNORMAL LOW (ref 7.350–7.450)
pO2, Arterial: 118 mmHg — ABNORMAL HIGH (ref 83.0–108.0)
pO2, Arterial: 175 mmHg — ABNORMAL HIGH (ref 83.0–108.0)

## 2019-07-15 LAB — BASIC METABOLIC PANEL
Anion gap: 10 (ref 5–15)
Anion gap: 15 (ref 5–15)
Anion gap: 15 (ref 5–15)
BUN: 24 mg/dL — ABNORMAL HIGH (ref 8–23)
BUN: 24 mg/dL — ABNORMAL HIGH (ref 8–23)
BUN: 24 mg/dL — ABNORMAL HIGH (ref 8–23)
CO2: 28 mmol/L (ref 22–32)
CO2: 24 mmol/L (ref 22–32)
CO2: 26 mmol/L (ref 22–32)
Calcium: 8.9 mg/dL (ref 8.9–10.3)
Calcium: 8.8 mg/dL — ABNORMAL LOW (ref 8.9–10.3)
Calcium: 8.9 mg/dL (ref 8.9–10.3)
Chloride: 102 mmol/L (ref 98–111)
Chloride: 101 mmol/L (ref 98–111)
Chloride: 102 mmol/L (ref 98–111)
Creatinine, Ser: 1.58 mg/dL — ABNORMAL HIGH (ref 0.44–1.00)
Creatinine, Ser: 1.56 mg/dL — ABNORMAL HIGH (ref 0.44–1.00)
Creatinine, Ser: 1.66 mg/dL — ABNORMAL HIGH (ref 0.44–1.00)
GFR calc Af Amer: 36 mL/min — ABNORMAL LOW (ref >60)
GFR calc Af Amer: 34 mL/min — ABNORMAL LOW (ref >60)
GFR calc Af Amer: 37 mL/min — ABNORMAL LOW (ref >60)
GFR calc non Af Amer: 31 mL/min — ABNORMAL LOW (ref >60)
GFR calc non Af Amer: 29 mL/min — ABNORMAL LOW (ref >60)
GFR calc non Af Amer: 32 mL/min — ABNORMAL LOW (ref >60)
Glucose, Bld: 221 mg/dL — ABNORMAL HIGH (ref 70–99)
Glucose, Bld: 281 mg/dL — ABNORMAL HIGH (ref 70–99)
Glucose, Bld: 316 mg/dL — ABNORMAL HIGH (ref 70–99)
Potassium: 4.4 mmol/L (ref 3.5–5.1)
Potassium: 4.3 mmol/L (ref 3.5–5.1)
Potassium: 4.5 mmol/L (ref 3.5–5.1)
Sodium: 140 mmol/L (ref 135–145)
Sodium: 141 mmol/L (ref 135–145)
Sodium: 142 mmol/L (ref 135–145)

## 2019-07-15 LAB — GLUCOSE, CAPILLARY
Glucose-Capillary: 293 mg/dL — ABNORMAL HIGH (ref 70–99)
Glucose-Capillary: 273 mg/dL — ABNORMAL HIGH (ref 70–99)
Glucose-Capillary: 224 mg/dL — ABNORMAL HIGH (ref 70–99)
Glucose-Capillary: 171 mg/dL — ABNORMAL HIGH (ref 70–99)

## 2019-07-15 LAB — CBC WITH DIFFERENTIAL/PLATELET
Abs Immature Granulocytes: 0.04 10*3/uL (ref 0.00–0.07)
Abs Immature Granulocytes: 0.05 10*3/uL (ref 0.00–0.07)
Basophils Absolute: 0 10*3/uL (ref 0.0–0.1)
Basophils Absolute: 0 10*3/uL (ref 0.0–0.1)
Basophils Relative: 0 %
Basophils Relative: 0 %
Eosinophils Absolute: 0 10*3/uL (ref 0.0–0.5)
Eosinophils Absolute: 0.2 10*3/uL (ref 0.0–0.5)
Eosinophils Relative: 0 %
Eosinophils Relative: 2 %
HCT: 37.8 % (ref 36.0–46.0)
HCT: 40.9 % (ref 36.0–46.0)
Hemoglobin: 11 g/dL — ABNORMAL LOW (ref 12.0–15.0)
Hemoglobin: 11.8 g/dL — ABNORMAL LOW (ref 12.0–15.0)
Immature Granulocytes: 0 %
Immature Granulocytes: 1 %
Lymphocytes Relative: 14 %
Lymphocytes Relative: 2 %
Lymphs Abs: 0.2 10*3/uL — ABNORMAL LOW (ref 0.7–4.0)
Lymphs Abs: 1.3 10*3/uL (ref 0.7–4.0)
MCH: 26.8 pg (ref 26.0–34.0)
MCH: 27.1 pg (ref 26.0–34.0)
MCHC: 28.9 g/dL — ABNORMAL LOW (ref 30.0–36.0)
MCHC: 29.1 g/dL — ABNORMAL LOW (ref 30.0–36.0)
MCV: 92.2 fL (ref 80.0–100.0)
MCV: 93.8 fL (ref 80.0–100.0)
Monocytes Absolute: 0.1 10*3/uL (ref 0.1–1.0)
Monocytes Absolute: 0.9 10*3/uL (ref 0.1–1.0)
Monocytes Relative: 1 %
Monocytes Relative: 9 %
Neutro Abs: 6.7 10*3/uL (ref 1.7–7.7)
Neutro Abs: 8.6 10*3/uL — ABNORMAL HIGH (ref 1.7–7.7)
Neutrophils Relative %: 74 %
Neutrophils Relative %: 97 %
Platelets: 240 10*3/uL (ref 150–400)
Platelets: 313 10*3/uL (ref 150–400)
RBC: 4.1 MIL/uL (ref 3.87–5.11)
RBC: 4.36 MIL/uL (ref 3.87–5.11)
RDW: 16.6 % — ABNORMAL HIGH (ref 11.5–15.5)
RDW: 16.7 % — ABNORMAL HIGH (ref 11.5–15.5)
WBC: 8.9 10*3/uL (ref 4.0–10.5)
WBC: 9.1 10*3/uL (ref 4.0–10.5)
nRBC: 0 % (ref 0.0–0.2)
nRBC: 0 % (ref 0.0–0.2)

## 2019-07-15 LAB — TROPONIN I (HIGH SENSITIVITY)
Troponin I (High Sensitivity): 41 ng/L — ABNORMAL HIGH (ref <18)
Troponin I (High Sensitivity): 53 ng/L — ABNORMAL HIGH (ref <18)
Troponin I (High Sensitivity): 56 ng/L — ABNORMAL HIGH (ref <18)

## 2019-07-15 LAB — CBG MONITORING, ED: Glucose-Capillary: 307 mg/dL — ABNORMAL HIGH (ref 70–99)

## 2019-07-15 LAB — BRAIN NATRIURETIC PEPTIDE: B Natriuretic Peptide: 1618.7 pg/mL — ABNORMAL HIGH (ref 0.0–100.0)

## 2019-07-15 LAB — SARS CORONAVIRUS 2 BY RT PCR (HOSPITAL ORDER, PERFORMED IN ~~LOC~~ HOSPITAL LAB): SARS Coronavirus 2: NEGATIVE

## 2019-07-15 LAB — TSH: TSH: 1.1 u[IU]/mL (ref 0.350–4.500)

## 2019-07-15 MED ORDER — FUROSEMIDE 10 MG/ML IJ SOLN
40.0000 mg | Freq: Two times a day (BID) | INTRAMUSCULAR | Status: DC
  Administered 2019-07-15 – 2019-07-18 (×6): 40 mg via INTRAVENOUS
  Filled 2019-07-15 (×6): qty 4

## 2019-07-15 MED ORDER — METHYLPREDNISOLONE SODIUM SUCC 125 MG IJ SOLR
125.0000 mg | Freq: Once | INTRAMUSCULAR | Status: AC
  Administered 2019-07-15: 125 mg via INTRAVENOUS
  Filled 2019-07-15: qty 2

## 2019-07-15 MED ORDER — FERROUS SULFATE 325 (65 FE) MG PO TABS
325.0000 mg | ORAL_TABLET | Freq: Two times a day (BID) | ORAL | Status: DC
  Administered 2019-07-16 – 2019-07-31 (×30): 325 mg via ORAL
  Filled 2019-07-15 (×31): qty 1

## 2019-07-15 MED ORDER — DILTIAZEM HCL-DEXTROSE 125-5 MG/125ML-% IV SOLN (PREMIX)
5.0000 mg/h | INTRAVENOUS | Status: DC
  Administered 2019-07-15: 5 mg/h via INTRAVENOUS
  Administered 2019-07-16 (×3): 12.5 mg/h via INTRAVENOUS
  Filled 2019-07-15 (×5): qty 125

## 2019-07-15 MED ORDER — DONEPEZIL HCL 10 MG PO TABS
10.0000 mg | ORAL_TABLET | Freq: Every day | ORAL | Status: DC
  Administered 2019-07-16 – 2019-07-30 (×14): 10 mg via ORAL
  Filled 2019-07-15 (×15): qty 1

## 2019-07-15 MED ORDER — MEMANTINE HCL-DONEPEZIL HCL ER 28-10 MG PO CP24: 1.0000 | ORAL_CAPSULE | Freq: Every day | ORAL | Status: DC

## 2019-07-15 MED ORDER — PANTOPRAZOLE SODIUM 40 MG PO TBEC
40.0000 mg | DELAYED_RELEASE_TABLET | Freq: Every day | ORAL | Status: DC
  Administered 2019-07-16 – 2019-07-31 (×16): 40 mg via ORAL
  Filled 2019-07-15 (×17): qty 1

## 2019-07-15 MED ORDER — METOPROLOL SUCCINATE ER 50 MG PO TB24: 50.0000 mg | ORAL_TABLET | Freq: Every day | ORAL | Status: DC

## 2019-07-15 MED ORDER — LEVALBUTEROL HCL 0.63 MG/3ML IN NEBU: 0.6300 mg | INHALATION_SOLUTION | Freq: Four times a day (QID) | RESPIRATORY_TRACT | Status: DC | PRN

## 2019-07-15 MED ORDER — QUETIAPINE FUMARATE 100 MG PO TABS
200.0000 mg | ORAL_TABLET | Freq: Every day | ORAL | Status: DC
  Administered 2019-07-16: 200 mg via ORAL
  Filled 2019-07-15 (×2): qty 2

## 2019-07-15 MED ORDER — METOPROLOL TARTRATE 5 MG/5ML IV SOLN
2.5000 mg | Freq: Once | INTRAVENOUS | Status: AC
  Administered 2019-07-15: 2.5 mg via INTRAVENOUS
  Filled 2019-07-15: qty 5

## 2019-07-15 MED ORDER — METHYLPREDNISOLONE SODIUM SUCC 40 MG IJ SOLR
40.0000 mg | Freq: Two times a day (BID) | INTRAMUSCULAR | Status: DC
  Administered 2019-07-15: 40 mg via INTRAVENOUS
  Filled 2019-07-15: qty 1

## 2019-07-15 MED ORDER — PERPHENAZINE 4 MG PO TABS
4.0000 mg | ORAL_TABLET | Freq: Three times a day (TID) | ORAL | Status: DC
  Administered 2019-07-16 – 2019-07-31 (×45): 4 mg via ORAL
  Filled 2019-07-15 (×51): qty 1

## 2019-07-15 MED ORDER — ASPIRIN 325 MG PO TABS: 325.0000 mg | ORAL_TABLET | Freq: Every day | ORAL | Status: DC

## 2019-07-15 MED ORDER — BUDESONIDE 0.25 MG/2ML IN SUSP
0.2500 mg | Freq: Two times a day (BID) | RESPIRATORY_TRACT | Status: DC
  Administered 2019-07-15 – 2019-07-31 (×33): 0.25 mg via RESPIRATORY_TRACT
  Filled 2019-07-15 (×33): qty 2

## 2019-07-15 MED ORDER — RIVAROXABAN 20 MG PO TABS: 20.0000 mg | ORAL_TABLET | Freq: Every day | ORAL | Status: DC

## 2019-07-15 MED ORDER — FUROSEMIDE 10 MG/ML IJ SOLN
20.0000 mg | Freq: Once | INTRAMUSCULAR | Status: AC
  Administered 2019-07-15: 20 mg via INTRAVENOUS
  Filled 2019-07-15: qty 2

## 2019-07-15 MED ORDER — ALBUTEROL (5 MG/ML) CONTINUOUS INHALATION SOLN
10.0000 mg/h | INHALATION_SOLUTION | Freq: Once | RESPIRATORY_TRACT | Status: AC
  Administered 2019-07-15: 10 mg/h via RESPIRATORY_TRACT
  Filled 2019-07-15: qty 20

## 2019-07-15 MED ORDER — METOPROLOL TARTRATE 5 MG/5ML IV SOLN
2.5000 mg | Freq: Four times a day (QID) | INTRAVENOUS | Status: DC
  Administered 2019-07-15 – 2019-07-17 (×6): 2.5 mg via INTRAVENOUS
  Filled 2019-07-15 (×7): qty 5

## 2019-07-15 MED ORDER — ALBUTEROL SULFATE HFA 108 (90 BASE) MCG/ACT IN AERS
8.0000 | INHALATION_SPRAY | RESPIRATORY_TRACT | Status: AC
  Administered 2019-07-15: 8 via RESPIRATORY_TRACT
  Filled 2019-07-15: qty 6.7

## 2019-07-15 MED ORDER — ENOXAPARIN SODIUM 100 MG/ML ~~LOC~~ SOLN
100.0000 mg | SUBCUTANEOUS | Status: DC
  Administered 2019-07-15: 100 mg via SUBCUTANEOUS
  Filled 2019-07-15: qty 1

## 2019-07-15 MED ORDER — MODAFINIL 100 MG PO TABS
100.0000 mg | ORAL_TABLET | Freq: Every day | ORAL | Status: DC
  Administered 2019-07-16 – 2019-07-31 (×16): 100 mg via ORAL
  Filled 2019-07-15 (×17): qty 1

## 2019-07-15 MED ORDER — ONDANSETRON HCL 4 MG PO TABS: 4.0000 mg | ORAL_TABLET | Freq: Four times a day (QID) | ORAL | Status: DC | PRN

## 2019-07-15 MED ORDER — ALPRAZOLAM 0.25 MG PO TABS
0.2500 mg | ORAL_TABLET | Freq: Two times a day (BID) | ORAL | Status: DC
  Administered 2019-07-16 (×2): 0.25 mg via ORAL
  Filled 2019-07-15 (×4): qty 1

## 2019-07-15 MED ORDER — LEVALBUTEROL HCL 0.63 MG/3ML IN NEBU
0.6300 mg | INHALATION_SOLUTION | Freq: Four times a day (QID) | RESPIRATORY_TRACT | Status: DC
  Administered 2019-07-15 – 2019-07-16 (×7): 0.63 mg via RESPIRATORY_TRACT
  Filled 2019-07-15 (×7): qty 3

## 2019-07-15 MED ORDER — ONDANSETRON HCL 4 MG/2ML IJ SOLN: 4.0000 mg | Freq: Four times a day (QID) | INTRAMUSCULAR | Status: DC | PRN

## 2019-07-15 MED ORDER — INSULIN GLARGINE 100 UNIT/ML ~~LOC~~ SOLN
10.0000 [IU] | Freq: Every day | SUBCUTANEOUS | Status: DC
  Administered 2019-07-15 – 2019-07-28 (×14): 10 [IU] via SUBCUTANEOUS
  Filled 2019-07-15 (×16): qty 0.1

## 2019-07-15 MED ORDER — ATORVASTATIN CALCIUM 10 MG PO TABS
20.0000 mg | ORAL_TABLET | Freq: Every day | ORAL | Status: DC
  Administered 2019-07-16 – 2019-07-30 (×14): 20 mg via ORAL
  Filled 2019-07-15 (×15): qty 2

## 2019-07-15 MED ORDER — FUROSEMIDE 10 MG/ML IJ SOLN
40.0000 mg | Freq: Every day | INTRAMUSCULAR | Status: DC
  Administered 2019-07-15: 40 mg via INTRAVENOUS
  Filled 2019-07-15: qty 4

## 2019-07-15 MED ORDER — FUROSEMIDE 10 MG/ML IJ SOLN: 20.0000 mg | Freq: Every day | INTRAMUSCULAR | Status: DC

## 2019-07-15 MED ORDER — IPRATROPIUM BROMIDE 0.02 % IN SOLN
0.5000 mg | Freq: Four times a day (QID) | RESPIRATORY_TRACT | Status: DC
  Administered 2019-07-15 – 2019-07-16 (×7): 0.5 mg via RESPIRATORY_TRACT
  Filled 2019-07-15 (×7): qty 2.5

## 2019-07-15 MED ORDER — INSULIN ASPART 100 UNIT/ML ~~LOC~~ SOLN
0.0000 [IU] | Freq: Three times a day (TID) | SUBCUTANEOUS | Status: DC
  Administered 2019-07-15: 7 [IU] via SUBCUTANEOUS
  Administered 2019-07-15: 5 [IU] via SUBCUTANEOUS
  Administered 2019-07-15: 3 [IU] via SUBCUTANEOUS
  Administered 2019-07-16 (×2): 1 [IU] via SUBCUTANEOUS
  Administered 2019-07-16: 3 [IU] via SUBCUTANEOUS
  Administered 2019-07-17: 2 [IU] via SUBCUTANEOUS
  Administered 2019-07-18: 1 [IU] via SUBCUTANEOUS
  Administered 2019-07-18: 2 [IU] via SUBCUTANEOUS
  Administered 2019-07-19: 3 [IU] via SUBCUTANEOUS
  Administered 2019-07-20: 1 [IU] via SUBCUTANEOUS
  Administered 2019-07-20: 2 [IU] via SUBCUTANEOUS
  Administered 2019-07-21: 7 [IU] via SUBCUTANEOUS
  Administered 2019-07-21 – 2019-07-22 (×2): 2 [IU] via SUBCUTANEOUS
  Administered 2019-07-23: 3 [IU] via SUBCUTANEOUS
  Administered 2019-07-24 – 2019-07-26 (×4): 2 [IU] via SUBCUTANEOUS
  Administered 2019-07-26: 1 [IU] via SUBCUTANEOUS
  Administered 2019-07-27 – 2019-07-28 (×2): 3 [IU] via SUBCUTANEOUS
  Administered 2019-07-29 (×2): 1 [IU] via SUBCUTANEOUS
  Administered 2019-07-30: 3 [IU] via SUBCUTANEOUS
  Administered 2019-07-31: 2 [IU] via SUBCUTANEOUS

## 2019-07-15 MED ORDER — MEMANTINE HCL ER 28 MG PO CP24
28.0000 mg | ORAL_CAPSULE | Freq: Every day | ORAL | Status: DC
  Administered 2019-07-16 – 2019-07-31 (×16): 28 mg via ORAL
  Filled 2019-07-15 (×17): qty 1

## 2019-07-15 NOTE — Plan of Care (Signed)
New care plan added

## 2019-07-15 NOTE — Progress Notes (Signed)
PROGRESS NOTE    Cheryl [name]  QQI:[id number] DOB: September 06, 1940 DOA: 07/14/2019 PCP: Letta Median, MD   Brief Narrative: 79 year old COPD, Systolic Heart EF 94-17 %, schizophrenia, CKD stage B , recently discharge from [facility] treated for A flutter, COPD HF exacerbation, discharge day prior to admission. Presents with worsening SOB, Respiratory failure.     Assessment & Plan:   Principal Problem:   Acute respiratory failure with hypoxia (HCC) Active Problems:   COPD exacerbation (HCC)   HTN (hypertension)   Dementia (HCC)   Persistent atrial fibrillation (HCC)   Acute on chronic systolic CHF (congestive heart failure) (HCC)  1-Acute Hypoxic Respiratory Failure, hypercapnic.  Secondary to Heart failure exacerbation,  COPD exacerbation.  Continue with nebulizer treatment.  Patient place on BIPAP.  CCM consulted.  IV lasix 40 mg IV BID>  Strict I and o.  Discontinue solumedrol per CCM recommendations.  Addendum:  I came to see patient Twice this afternoon. fiort time after she had a Possible aspiration event, she was off BIPAP for 2 hours. She started to eat and coughing. She was in mild distress but not as bas as I saw her this am. I came again at 3;27 PM, patient was place back on BIPAp due to respiratory distress., she appears more comfortable on BIPAP now.  Continue with BIPAP, continue to monitor.   2-A flutter  RVR;  Cardiology order IV metoprolol. Plan to start cardizem gtt due to persistent tachycardia.  She has a 5 second postermination pause while on amiodarone and toprol.  On lovenox.  TSH normal.   Diabetes Type 2;  SSI.   Chronic anemia;  Follow hb.  Hb stable at 11.   Acute on Chronic renal failure stage III; b, cr baseline 1.3  Monitor on lasix.   Hyperlipidemia; continue with statins.  History of schizophrenia; continue with xanax and seraquel.   History of dementia; Namzaric.  History of lung cancer S./p radiation.   Estimated body mass  index is 35.87 kg/m as calculated from the following:   Height as of this encounter: 5\' 4"  (1.626 m).   Weight as of this encounter: 94.8 kg.   DVT prophylaxis: Lovenox Code Status: Full code Family Communication: Son updated over phone Disposition Plan:  Status is: Inpatient  Remains inpatient appropriate because:Hemodynamically unstable   Dispo: The patient is from: Home              Anticipated d/c is to: SNF              Anticipated d/c date is: 3 days              Patient currently is not medically stable to d/c.        Consultants:   CCM  Cardiology   Procedures:   None  Antimicrobials:    Subjective: Patient this morning appears to be in respiratory distress, she was place on BIPAP, received lasix and nebulizer with improvement of symptoms.   This afternoon she was off BIPAP, ate had what appears to be aspiration event. But was stable.   Subsequently place back on BIPAP. Less distress on BIPAP>   Objective: Vitals:   07/15/19 0615 07/15/19 0628 07/15/19 0700 07/15/19 0745  BP: (!) 154/93 (!) 144/92 (!) 149/79 (!) 151/100  Pulse:   (!) 123 (!) 119  Resp: (!) 29 (!) 34 (!) 35 (!) 22  Temp:      TempSrc:      SpO2: 98% 96% 100%  100%  Weight:      Height:       No intake or output data in the 24 hours ending 07/15/19 0830 Filed Weights   07/15/19 0003  Weight: 94.8 kg    Examination:  General exam: Dyspnea.  Respiratory system: Tachypnea, Decreased breath sounds. . Gastrointestinal system: Abdomen is nondistended, soft and nontender. No organomegaly or masses felt. Normal bowel sounds heard. Central nervous system: Alert  Extremities: Symmetric 5 x 5 power. Skin: No rashes, lesions or ulcers    Data Reviewed: I have personally reviewed following labs and imaging studies  CBC: Recent Labs  Lab 07/09/19 0438 07/09/19 0438 07/10/19 0753 07/13/19 0605 07/15/19 0023 07/15/19 0344 07/15/19 0652  WBC 9.9  --  6.5 5.7 9.1  --  8.9    NEUTROABS  --   --   --   --  6.7  --  8.6*  HGB 10.8*   < > 10.2* 11.1* 11.0* 12.9 11.8*  HCT 33.9*   < > 31.8* 36.2 37.8 38.0 40.9  MCV 85.6  --  86.4 89.4 92.2  --  93.8  PLT 379  --  315 300 313  --  240   < > = values in this interval not displayed.   Basic Metabolic Panel: Recent Labs  Lab 07/10/19 0537 07/10/19 0537 07/11/19 0705 07/12/19 0553 07/13/19 0605 07/15/19 0023 07/15/19 0344  NA 139   < > 140 142 140 140 142  K 3.8   < > 4.3 4.3 4.0 4.4 4.6  CL 107  --  104 104 102 102  --   CO2 24  --  28 31 30 28   --   GLUCOSE 67*  --  99 95 126* 221*  --   BUN 44*  --  30* 23 24* 24*  --   CREATININE 1.55*  --  1.34* 1.31* 1.30* 1.58*  --   CALCIUM 8.3*  --  8.8* 8.7* 8.7* 8.9  --    < > = values in this interval not displayed.   GFR: Estimated Creatinine Clearance: 33.3 mL/min (A) (by C-G formula based on SCr of 1.58 mg/dL (H)). Liver Function Tests: No results for input(s): AST, ALT, ALKPHOS, BILITOT, PROT, ALBUMIN in the last 168 hours. No results for input(s): LIPASE, AMYLASE in the last 168 hours. No results for input(s): AMMONIA in the last 168 hours. Coagulation Profile: No results for input(s): INR, PROTIME in the last 168 hours. Cardiac Enzymes: No results for input(s): CKTOTAL, CKMB, CKMBINDEX, TROPONINI in the last 168 hours. BNP (last 3 results) No results for input(s): PROBNP in the last 8760 hours. HbA1C: No results for input(s): HGBA1C in the last 72 hours. CBG: Recent Labs  Lab 07/13/19 1133 07/13/19 1630 07/13/19 2124 07/14/19 0730 07/14/19 1126  GLUCAP 97 136* 151* 100* 194*   Lipid Profile: No results for input(s): CHOL, HDL, LDLCALC, TRIG, CHOLHDL, LDLDIRECT in the last 72 hours. Thyroid Function Tests: Recent Labs    07/15/19 0652  TSH 1.100   Anemia Panel: No results for input(s): VITAMINB12, FOLATE, FERRITIN, TIBC, IRON, RETICCTPCT in the last 72 hours. Sepsis Labs: No results for input(s): PROCALCITON, LATICACIDVEN in the last  168 hours.  Recent Results (from the past 240 hour(s))  MRSA PCR Screening     Status: None   Collection Time: 07/08/19  4:00 PM   Specimen: Nasal Mucosa; Nasopharyngeal  Result Value Ref Range Status   MRSA by PCR NEGATIVE NEGATIVE Final    Comment:  The GeneXpert MRSA Assay (FDA approved for NASAL specimens only), is one component of a comprehensive MRSA colonization surveillance program. It is not intended to diagnose MRSA infection nor to guide or monitor treatment for MRSA infections. Performed at [facility], [place], [place] [address]   SARS CORONAVIRUS 2 (TAT 6-24 HRS) Nasopharyngeal Nasopharyngeal Swab     Status: None   Collection Time: 07/14/19 12:00 PM   Specimen: Nasopharyngeal Swab  Result Value Ref Range Status   SARS Coronavirus 2 NEGATIVE NEGATIVE Final    Comment: (NOTE) SARS-CoV-2 target nucleic acids are NOT DETECTED.  The SARS-CoV-2 RNA is generally detectable in upper and lower respiratory specimens during the acute phase of infection. Negative results do not preclude SARS-CoV-2 infection, do not rule out co-infections with other pathogens, and should not be used as the sole basis for treatment or other patient management decisions. Negative results must be combined with clinical observations, patient history, and epidemiological information. The expected result is Negative.  Fact Sheet for Patients: SugarRoll.be  Fact Sheet for Healthcare Providers: https://www.woods-mathews.com/  This test is not yet approved or cleared by the [place] FDA and  has been authorized for detection and/or diagnosis of SARS-CoV-2 by FDA under an Emergency Use Authorization (EUA). This EUA will remain  in effect (meaning this test can be used) for the duration of the COVID-19 declaration under Se ction 564(b)(1) of the Act, 21 U.S.C. section 360bbb-3(b)(1), unless the authorization is  terminated or revoked sooner.  Performed at [facility], [place] [address] [place], [place] [address]   SARS Coronavirus 2 by RT PCR (hospital order, performed in [facility] hospital lab) Nasopharyngeal Nasopharyngeal Swab     Status: None   Collection Time: 07/15/19 12:28 AM   Specimen: Nasopharyngeal Swab  Result Value Ref Range Status   SARS Coronavirus 2 NEGATIVE NEGATIVE Final    Comment: (NOTE) SARS-CoV-2 target nucleic acids are NOT DETECTED.  The SARS-CoV-2 RNA is generally detectable in upper and lower respiratory specimens during the acute phase of infection. The lowest concentration of SARS-CoV-2 viral copies this assay can detect is 250 copies / mL. A negative result does not preclude SARS-CoV-2 infection and should not be used as the sole basis for treatment or other patient management decisions.  A negative result may occur with improper specimen collection / handling, submission of specimen other than nasopharyngeal swab, presence of viral mutation(s) within the areas targeted by this assay, and inadequate number of viral copies (<250 copies / mL). A negative result must be combined with clinical observations, patient history, and epidemiological information.  Fact Sheet for Patients:   StrictlyIdeas.no  Fact Sheet for Healthcare Providers: BankingDealers.co.za  This test is not yet approved or  cleared by the [place] FDA and has been authorized for detection and/or diagnosis of SARS-CoV-2 by FDA under an Emergency Use Authorization (EUA).  This EUA will remain in effect (meaning this test can be used) for the duration of the COVID-19 declaration under Section 564(b)(1) of the Act, 21 U.S.C. section 360bbb-3(b)(1), unless the authorization is terminated or revoked sooner.  Performed at [facility], [place] [address] [place], [place] [address]          Radiology Studies: DG Chest Port 1  View  Result Date: 07/15/2019 CLINICAL DATA:  Short of breath, diaphoretic, tachypnea EXAM: PORTABLE CHEST 1 VIEW COMPARISON:  07/03/2019 FINDINGS: Single frontal view of the chest demonstrates a stable cardiac silhouette. There is chronic  scarring of the left hilum. No acute airspace disease or pneumothorax. Minimal blunting of the costophrenic angles consistent with trace bilateral pleural effusions. No acute bony abnormalities. IMPRESSION: 1. Trace bilateral pleural effusions. 2. No acute airspace disease. 3. Chronic scarring left hilum. Electronically Signed   By: Randa Ngo M.D.   On: 07/15/2019 00:47        Scheduled Meds: . ALPRAZolam  0.25 mg Oral BID  . aspirin  325 mg Oral Daily  . atorvastatin  20 mg Oral QHS  . budesonide (PULMICORT) nebulizer solution  0.25 mg Nebulization BID  . memantine  28 mg Oral Daily   And  . donepezil  10 mg Oral QHS  . ferrous sulfate  325 mg Oral BID WC  . furosemide  20 mg Intravenous Daily  . insulin aspart  0-9 Units Subcutaneous TID WC  . insulin glargine  10 Units Subcutaneous Daily  . levalbuterol  0.63 mg Nebulization Q6H  . methylPREDNISolone (SOLU-MEDROL) injection  40 mg Intravenous Q12H  . metoprolol succinate  50 mg Oral Daily  . modafinil  100 mg Oral Daily  . pantoprazole  40 mg Oral Daily  . perphenazine  4 mg Oral TID  . QUEtiapine  200 mg Oral QHS  . rivaroxaban  20 mg Oral Q supper   Continuous Infusions:   LOS: 0 days    Time spent: 35 minutes    Tomasita Beevers A Erionna Strum, MD [facility]   If 7PM-7AM, please contact night-coverage [email or web address]  07/15/2019, 8:30 AM

## 2019-07-15 NOTE — ED Notes (Addendum)
Respiratory at bediside. Patient placed on Bipap.

## 2019-07-15 NOTE — Progress Notes (Signed)
ANTICOAGULATION CONSULT NOTE - Initial Consult  Pharmacy Consult for Lovenox  Indication: atrial fibrillation  No Known Allergies  Patient Measurements: Height: 5\' 4"  (162.6 cm) Weight: 94.8 kg (209 lb) IBW/kg (Calculated) : 54.7  Vital Signs: Temp: 98.3 F (36.8 C) (07/02 1034) Temp Source: Oral (07/02 0001) BP: 146/87 (07/02 1034) Pulse Rate: 119 (07/02 1034)  Labs: Recent Labs    07/13/19 4496 07/13/19 7591 07/15/19 0023 07/15/19 0023 07/15/19 0221 07/15/19 0344 07/15/19 0344 07/15/19 0652 07/15/19 0831 07/15/19 0950 07/15/19 0955  HGB 11.1*   < > 11.0*   < >  --  12.9   < > 11.8*  --   --  13.6  HCT 36.2   < > 37.8   < >  --  38.0  --  40.9  --   --  40.0  PLT 300  --  313  --   --   --   --  240  --   --   --   CREATININE 1.30*   < > 1.58*  --   --   --   --   --  1.56* 1.66*  --   TROPONINIHS  --   --  41*  --  53*  --   --  56*  --   --   --    < > = values in this interval not displayed.    Estimated Creatinine Clearance: 31.7 mL/min (A) (by C-G formula based on SCr of 1.66 mg/dL (H)).   Medical History: Past Medical History:  Diagnosis Date  . COPD (chronic obstructive pulmonary disease) ([place])   . Dementia ([place])   . Diabetes mellitus without complication ([place])   . GERD (gastroesophageal reflux disease)   . Hyperlipidemia   . Hypertension   . Lung cancer ([place])   . Mental retardation   . Schizophrenia [facility])     Assessment:  79 year old female with a history of atrial fibrillation on Xarelto prior to admission (last dose received 6/30) who is now NPO on BiPAP. Cardiology requested change from Xarelto to Enoxaparin while NPO. SCr up 1.66 (baseline 1.3 -1.5). Borderline CrCl~31 mL/min.   Goal of Therapy:  Anti-Xa level 0.6-1 units/ml 4hrs after LMWH dose given Monitor platelets by anticoagulation protocol: Yes   Plan:  Lovenox 100mg  SQ every 24 hours.  Watch SCr - if beings improving, increase to every 12 hours.  Monitor for ability to take  orals and transition back to Xarelto.   Brain Hilts 07/15/2019,11:46 AM

## 2019-07-15 NOTE — ED Provider Notes (Signed)
[facility] EMERGENCY DEPARTMENT Provider Note   CSN: [id number] Arrival date & time: 07/14/19  2351     History Chief Complaint  Patient presents with  . Shortness of Breath    Cheryl [name] is a 79 y.o. female.  Patient presents to the emergency department for evaluation of shortness of breath.  Patient comes to the ER from skilled nursing facility.  She does have a history of COPD and lung cancer.  Patient has baseline dementia and MR, cannot provide any more significant history.  All history provided by EMS.        Past Medical History:  Diagnosis Date  . COPD (chronic obstructive pulmonary disease) ([place])   . Dementia ([place])   . Diabetes mellitus without complication ([place])   . GERD (gastroesophageal reflux disease)   . Hyperlipidemia   . Hypertension   . Lung cancer ([place])   . Mental retardation   . Schizophrenia [facility])     Patient Active Problem List   Diagnosis Date Noted  . Atrial flutter ([place])   . Sinus node dysfunction (HCC)   . Persistent atrial fibrillation ([place])   . Systolic heart failure ([place])   . Dementia ([place])   . Type II diabetes mellitus with renal manifestations ([place])   . Elevated troponin   . Acute on chronic respiratory failure with hypoxia ([place])   . Acute renal failure superimposed on stage 3a chronic kidney disease ([place])   . Acute respiratory failure with hypoxia ([place])   . Shortness of breath   . COPD with acute exacerbation ([place]) 05/09/2019  . Respiratory failure with hypoxia and hypercapnia ([place]) 05/09/2019  . Hyperglycemia due to diabetes mellitus ([place]) 05/09/2019  . GERD (gastroesophageal reflux disease) 05/09/2019  . HTN (hypertension) 05/09/2019  . HLD (hyperlipidemia) 05/09/2019  . Moderate dementia, with behavioral disturbance ([place]) 10/06/2016  . Incontinence of feces 05/30/2016  . COPD exacerbation ([place]) 01/08/2016  . Hypokalemia 02/07/2015  . Cancer of left lung ([place]) 07/25/2011    Past Surgical History:    Procedure Laterality Date  . ABDOMINAL HYSTERECTOMY       OB History   No obstetric history on file.     Family History  Problem Relation Age of Onset  . Breast cancer Neg Hx     Social History   Tobacco Use  . Smoking status: Former Smoker    Packs/day: 0.25    Types: Cigarettes    Quit date: 08/30/2016    Years since quitting: 2.8  . Smokeless tobacco: Never Used  Vaping Use  . Vaping Use: Never used  Substance Use Topics  . Alcohol use: No  . Drug use: No    Home Medications Prior to Admission medications   Medication Sig Start Date End Date Taking? Authorizing Provider  acetaminophen (TYLENOL) 325 MG tablet Take 650 mg by mouth every 4 (four) hours as needed for mild pain or fever.     [provider]  ALPRAZolam Duanne Moron) 0.25 MG tablet Take 1 tablet (0.25 mg total) by mouth 2 (two) times daily. 07/14/19   Sharen Hones, MD  aspirin 325 MG tablet Take 325 mg by mouth daily.     [provider]  atorvastatin (LIPITOR) 20 MG tablet Take 20 mg by mouth at bedtime.     [provider]  ferrous sulfate 325 (65 FE) MG tablet Take 325 mg by mouth 2 (two) times daily with a meal.     [provider]  fluticasone (FLONASE) 50  MCG/ACT nasal spray Place 2 sprays into both nostrils daily.    [provider]  Fluticasone-Salmeterol (ADVAIR DISKUS) 250-50 MCG/DOSE AEPB Inhale 1 puff into the lungs 2 (two) times daily.    [provider]  insulin glargine (LANTUS) 100 UNIT/ML injection Inject 0.1 mLs (10 Units total) into the skin daily. 05/17/19   Vashti Hey, MD  levalbuterol Penne Lash) 0.63 MG/3ML nebulizer solution Take 3 mLs (0.63 mg total) by nebulization every 8 (eight) hours. 07/14/19   Sharen Hones, MD  magnesium hydroxide (MILK OF MAGNESIA) 400 MG/5ML suspension Take 30 mLs by mouth daily as needed for moderate constipation.     [provider]  Memantine HCl-Donepezil HCl (NAMZARIC) 28-10 MG CP24 Take 1  capsule by mouth daily. 05/15/19   Swayze, Ava, DO  metoprolol succinate (TOPROL XL) 50 MG 24 hr tablet Take 1 tablet (50 mg total) by mouth in the morning and at bedtime. Take with or immediately following a meal. 07/14/19 08/13/19  Sharen Hones, MD  modafinil (PROVIGIL) 100 MG tablet Take 1 tablet (100 mg total) by mouth daily. 05/15/19   Swayze, Ava, DO  omeprazole (PRILOSEC) 40 MG capsule Take 40 mg by mouth daily.     [provider]  perphenazine (TRILAFON) 4 MG tablet Take 1 tablet (4 mg total) by mouth 3 (three) times daily. 05/15/19   Swayze, Ava, DO  QUEtiapine (SEROQUEL) 200 MG tablet Take 200 mg by mouth at bedtime.    [provider]  rivaroxaban (XARELTO) 20 MG TABS tablet Take 1 tablet (20 mg total) by mouth daily with supper. 07/14/19   Sharen Hones, MD    Allergies    Patient has no known allergies.  Review of Systems   Review of Systems  Unable to perform ROS: Dementia    Physical Exam Updated Vital Signs SpO2 100%   Physical Exam Vitals and nursing note reviewed.  Constitutional:      General: She is not in acute distress.    Appearance: Normal appearance. She is well-developed.  HENT:     Head: Normocephalic and atraumatic.     Right Ear: Hearing normal.     Left Ear: Hearing normal.     Nose: Nose normal.  Eyes:     Conjunctiva/sclera: Conjunctivae normal.     Pupils: Pupils are equal, round, and reactive to light.  Cardiovascular:     Rate and Rhythm: Regular rhythm. Tachycardia present.     Heart sounds: S1 normal and S2 normal. No murmur heard.  No friction rub. No gallop.   Pulmonary:     Effort: Tachypnea and accessory muscle usage present.     Breath sounds: Decreased breath sounds and wheezing present.  Chest:     Chest wall: No tenderness.  Abdominal:     General: Bowel sounds are normal.     Palpations: Abdomen is soft.     Tenderness: There is no abdominal tenderness. There is no guarding or rebound. Negative signs include Murphy's  sign and McBurney's sign.     Hernia: No hernia is present.  Musculoskeletal:        General: Normal range of motion.     Cervical back: Normal range of motion and neck supple.  Skin:    General: Skin is warm and dry.     Findings: No rash.  Neurological:     Mental Status: She is alert and oriented to person, place, and time.     GCS: GCS eye subscore is 4. GCS  verbal subscore is 5. GCS motor subscore is 6.     Cranial Nerves: No cranial nerve deficit.     Sensory: No sensory deficit.     Coordination: Coordination normal.  Psychiatric:        Speech: Speech normal.        Behavior: Behavior normal.        Thought Content: Thought content normal.     ED Results / Procedures / Treatments   Labs (all labs ordered are listed, but only abnormal results are displayed) Labs Reviewed  SARS CORONAVIRUS 2 BY RT PCR ([facility])  CBC WITH DIFFERENTIAL/PLATELET  BASIC METABOLIC PANEL  BRAIN NATRIURETIC PEPTIDE  TROPONIN I (HIGH SENSITIVITY)    EKG None  Radiology No results found.  Procedures Procedures (including critical care time)  Medications Ordered in ED Medications  methylPREDNISolone sodium succinate (SOLU-MEDROL) 125 mg/2 mL injection 125 mg (has no administration in time range)  albuterol (VENTOLIN HFA) 108 (90 Base) MCG/ACT inhaler 8 puff (has no administration in time range)    ED Course  I have reviewed the triage vital signs and the nursing notes.  Pertinent labs & imaging results that were available during my care of the patient were reviewed by me and considered in my medical decision making (see chart for details).    MDM Rules/Calculators/A&P                          Patient presents to the emergency department for evaluation of COPD exacerbation.  Patient comes to the ED from a nursing home.  Patient has psychiatric illness, dementia and developmental delay, cannot provide much information.  Reviewing her  records reveals multiple recent COPD exacerbation admissions to [facility] regional.  Patient in mild distress at arrival with increased work of breathing, decreased air movement in both lungs with significant wheezing.  Chest x-ray does not show evidence of pneumonia or other pathology.  No evidence of ischemia on EKG.  Patient's respiratory distress has worsened over time here in the ED.  She did not improve with continuous nebulizer treatment, was placed on BiPAP and will require hospitalization for further management.  CRITICAL CARE Performed by: Orpah Greek   Total critical care time: 35 minutes  Critical care time was exclusive of separately billable procedures and treating other patients.  Critical care was necessary to treat or prevent imminent or life-threatening deterioration.  Critical care was time spent personally by me on the following activities: development of treatment plan with patient and/or surrogate as well as nursing, discussions with consultants, evaluation of patient's response to treatment, examination of patient, obtaining history from patient or surrogate, ordering and performing treatments and interventions, ordering and review of laboratory studies, ordering and review of radiographic studies, pulse oximetry and re-evaluation of patient's condition.   Final Clinical Impression(s) / ED Diagnoses Final diagnoses:  None    Rx / DC Orders ED Discharge Orders    None       Robbye Dede, Gwenyth Allegra, MD 07/15/19 [phone]

## 2019-07-15 NOTE — Progress Notes (Signed)
Pt is on a cardizem drip that I had to titrate to it max dose. Rapid RN was called to eval for higher level of care. She suggest me to call cardio to make them aware, since she is able to stay on the unit at the max rate. Her HR right now is 123 and controled. Cardio has been paged.

## 2019-07-15 NOTE — Progress Notes (Signed)
Placed pt on bipap at this time as she was in severe respiratory distress. Pt immediately slowed RR and WOB.Pt received HHN through bipap and went to sleep. RN aware

## 2019-07-15 NOTE — ED Notes (Signed)
Paged MD regarding pt PO medication

## 2019-07-15 NOTE — ED Notes (Signed)
Report given to inpatient RN.

## 2019-07-15 NOTE — Progress Notes (Signed)
PCCM INTERVAL PROGRESS NOTE  Called to bedside to evaluate the patient for respiratory distress. She had been breathing better after diuresis and had been taken off BiPAP. She ate a very small amount of her lunch. She did have some coughing. Shortly after she developed further respiratory distress with rates as high as 40. She became diaphoretic, hypertensive, and more tachycardic. She was placed back on BiPAP. Symptoms almost immediately improved. Upon my arrival she is comfortably breathing on BiPAP with RR 18. Plan per Dr. Tyrell Antonio is to give additional lasix and start diltiazem infusion. Agree with plan. She should remain on BiPAP with short breaks if needed and will re-evaluate later. Appropriate for stepdown unit at this time. RN and RT instructed to call should her symptoms worsen.    Georgann Housekeeper, AGACNP-BC [place]  See Amion for personal pager PCCM on call pager [phone]  07/15/2019 3:41 PM

## 2019-07-15 NOTE — ED Triage Notes (Signed)
Pt BIB [facility] from [facility]. C/O SOB and increased WOB. O2 was 92% on RA @ [facility] per EMS. EMS found her to be diaphoretic, tachypnic, and placed her on 4L O2. Pt sats increaded to 100% on 4L with EMS. EMS vitals were BP 160/110, 120 HR w/some mild ST elevation, and O2 100% on 4L. 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

## 2019-07-15 NOTE — H&P (Signed)
History and Physical    Cheryl [name] WJX:[id number] DOB: 1940/09/22 DOA: 07/14/2019  PCP: Letta Median, MD  Patient coming from: [place].  Chief Complaint: Shortness of breath.  HPI: Cheryl [name] is a 79 y.o. female with history of COPD, systolic heart failure last EF measured last month was 30 to 35% schizophrenia, chronic kidney disease stage III A. fib who was admitted and discharged yesterday from [facility] during which patient had a prolonged: Patient was admitted for shortness of breath went into atrial flutter with RVR was started on amiodarone and went into bradycardia and pauses electrophysiology cardiology was consulted eventually was felt that patient did not need any pacemaker and was restarted on Toprol discharge yesterday was brought in the ER the patient became acutely short of breath.  No further history is available since patient cannot provide history and patient signed and unable to be reached.  ED Course: In the ER patient is hypoxic and found to be wheezing bilaterally chest x-ray showing features concerning for CHF labs are remarkable for BNP of 1600 creatinine increase from 1.3-1.5 blood glucose of 221 hemoglobin stable at 11 high sensitive troponin at 41 and 53.  ABG showed pH of 7.31 PCO2 of 60 Covid test was negative and since patient became more hypoxic despite nebulizer treatment patient had to be placed on BiPAP Lasix 20 mg IV was given patient admitted for acute respiratory failure secondary to COPD and CHF.  EKG shows tachycardia.  Review of Systems: As per HPI, rest all negative.   Past Medical History:  Diagnosis Date  . COPD (chronic obstructive pulmonary disease) ([place])   . Dementia ([place])   . Diabetes mellitus without complication ([place])   . GERD (gastroesophageal reflux disease)   . Hyperlipidemia   . Hypertension   . Lung cancer ([place])   . Mental retardation   . Schizophrenia [facility])     Past Surgical  History:  Procedure Laterality Date  . ABDOMINAL HYSTERECTOMY       reports that she quit smoking about 2 years ago. Her smoking use included cigarettes. She smoked 0.25 packs per day. She has never used smokeless tobacco. She reports that she does not drink alcohol and does not use drugs.  No Known Allergies  Family History  Problem Relation Age of Onset  . Breast cancer Neg Hx     Prior to Admission medications   Medication Sig Start Date End Date Taking? Authorizing Provider  acetaminophen (TYLENOL) 325 MG tablet Take 650 mg by mouth every 4 (four) hours as needed for mild pain or fever.     [provider]  ALPRAZolam Duanne Moron) 0.25 MG tablet Take 1 tablet (0.25 mg total) by mouth 2 (two) times daily. 07/14/19   Sharen Hones, MD  aspirin 325 MG tablet Take 325 mg by mouth daily.     [provider]  atorvastatin (LIPITOR) 20 MG tablet Take 20 mg by mouth at bedtime.     [provider]  ferrous sulfate 325 (65 FE) MG tablet Take 325 mg by mouth 2 (two) times daily with a meal.     [provider]  fluticasone (FLONASE) 50 MCG/ACT nasal spray Place 2 sprays into both nostrils daily.    [provider]  Fluticasone-Salmeterol (ADVAIR DISKUS) 250-50 MCG/DOSE AEPB Inhale 1 puff into the lungs 2 (two) times daily.    [provider]  insulin glargine (LANTUS) 100 UNIT/ML injection Inject 0.1 mLs (10 Units total) into  the skin daily. 05/17/19   Vashti Hey, MD  levalbuterol Penne Lash) 0.63 MG/3ML nebulizer solution Take 3 mLs (0.63 mg total) by nebulization every 8 (eight) hours. 07/14/19   Sharen Hones, MD  magnesium hydroxide (MILK OF MAGNESIA) 400 MG/5ML suspension Take 30 mLs by mouth daily as needed for moderate constipation.     [provider]  Memantine HCl-Donepezil HCl (NAMZARIC) 28-10 MG CP24 Take 1 capsule by mouth daily. 05/15/19   Swayze, Ava, DO  metoprolol succinate (TOPROL XL) 50 MG 24 hr tablet Take 1 tablet  (50 mg total) by mouth in the morning and at bedtime. Take with or immediately following a meal. 07/14/19 08/13/19  Sharen Hones, MD  modafinil (PROVIGIL) 100 MG tablet Take 1 tablet (100 mg total) by mouth daily. 05/15/19   Swayze, Ava, DO  omeprazole (PRILOSEC) 40 MG capsule Take 40 mg by mouth daily.     [provider]  perphenazine (TRILAFON) 4 MG tablet Take 1 tablet (4 mg total) by mouth 3 (three) times daily. 05/15/19   Swayze, Ava, DO  QUEtiapine (SEROQUEL) 200 MG tablet Take 200 mg by mouth at bedtime.    [provider]  rivaroxaban (XARELTO) 20 MG TABS tablet Take 1 tablet (20 mg total) by mouth daily with supper. 07/14/19   Sharen Hones, MD    Physical Exam: Constitutional: Moderately built and nourished. Vitals:   07/15/19 0436 07/15/19 0533 07/15/19 0534 07/15/19 0535  BP:    (!) 141/94  Pulse: (!) 124 (!) 123 (!) 123   Resp: 18 16 (!) 21 (!) 23  Temp:      TempSrc:      SpO2: 100% 100% 100%   Weight:      Height:       Eyes: Anicteric no pallor. ENMT: No discharge from the ears eyes nose or mouth. Neck: No mass felt.  JVD elevated. Respiratory: Bilateral expiratory wheezes no crepitations. Cardiovascular: S1-S2 heard tachycardic. Abdomen: Soft nontender bowel sounds present. Musculoskeletal: No edema. Skin: No rash. Neurologic: Patient is alert awake oriented to her name.  Moving all extremities. Psychiatric: Oriented to name history of dementia.   Labs on Admission: I have personally reviewed following labs and imaging studies  CBC: Recent Labs  Lab 07/09/19 0438 07/10/19 0753 07/13/19 0605 07/15/19 0023 07/15/19 0344  WBC 9.9 6.5 5.7 9.1  --   NEUTROABS  --   --   --  6.7  --   HGB 10.8* 10.2* 11.1* 11.0* 12.9  HCT 33.9* 31.8* 36.2 37.8 38.0  MCV 85.6 86.4 89.4 92.2  --   PLT 379 315 300 313  --    Basic Metabolic Panel: Recent Labs  Lab 07/08/19 0625 07/09/19 0438 07/10/19 0537 07/10/19 0537 07/11/19 0705 07/12/19 0553  07/13/19 0605 07/15/19 0023 07/15/19 0344  NA 138   < > 139   < > 140 142 140 140 142  K 3.8   < > 3.8   < > 4.3 4.3 4.0 4.4 4.6  CL 102   < > 107  --  104 104 102 102  --   CO2 27   < > 24  --  28 31 30 28   --   GLUCOSE 116*   < > 67*  --  99 95 126* 221*  --   BUN 52*   < > 44*  --  30* 23 24* 24*  --   CREATININE 1.49*   < > 1.55*  --  1.34* 1.31*  1.30* 1.58*  --   CALCIUM 8.5*   < > 8.3*  --  8.8* 8.7* 8.7* 8.9  --   MG 1.8  --   --   --   --   --   --   --   --    < > = values in this interval not displayed.   GFR: Estimated Creatinine Clearance: 33.3 mL/min (A) (by C-G formula based on SCr of 1.58 mg/dL (H)). Liver Function Tests: No results for input(s): AST, ALT, ALKPHOS, BILITOT, PROT, ALBUMIN in the last 168 hours. No results for input(s): LIPASE, AMYLASE in the last 168 hours. No results for input(s): AMMONIA in the last 168 hours. Coagulation Profile: No results for input(s): INR, PROTIME in the last 168 hours. Cardiac Enzymes: No results for input(s): CKTOTAL, CKMB, CKMBINDEX, TROPONINI in the last 168 hours. BNP (last 3 results) No results for input(s): PROBNP in the last 8760 hours. HbA1C: No results for input(s): HGBA1C in the last 72 hours. CBG: Recent Labs  Lab 07/13/19 1133 07/13/19 1630 07/13/19 2124 07/14/19 0730 07/14/19 1126  GLUCAP 97 136* 151* 100* 194*   Lipid Profile: No results for input(s): CHOL, HDL, LDLCALC, TRIG, CHOLHDL, LDLDIRECT in the last 72 hours. Thyroid Function Tests: No results for input(s): TSH, T4TOTAL, FREET4, T3FREE, THYROIDAB in the last 72 hours. Anemia Panel: No results for input(s): VITAMINB12, FOLATE, FERRITIN, TIBC, IRON, RETICCTPCT in the last 72 hours. Urine analysis:    Component Value Date/Time   COLORURINE YELLOW (A) 05/09/2019 2335   APPEARANCEUR CLEAR (A) 05/09/2019 2335   APPEARANCEUR Hazy 07/26/2013 1013   LABSPEC 1.038 (H) 05/09/2019 2335   LABSPEC 1.029 07/26/2013 1013   PHURINE 5.0 05/09/2019 2335    GLUCOSEU 50 (A) 05/09/2019 2335   GLUCOSEU Negative 07/26/2013 1013   HGBUR NEGATIVE 05/09/2019 2335   BILIRUBINUR NEGATIVE 05/09/2019 2335   BILIRUBINUR Negative 07/26/2013 1013   KETONESUR 5 (A) 05/09/2019 2335   PROTEINUR 30 (A) 05/09/2019 2335   NITRITE NEGATIVE 05/09/2019 2335   LEUKOCYTESUR NEGATIVE 05/09/2019 2335   LEUKOCYTESUR Negative 07/26/2013 1013   Sepsis Labs: @LABRCNTIP (procalcitonin:4,lacticidven:4) ) Recent Results (from the past 240 hour(s))  MRSA PCR Screening     Status: None   Collection Time: 07/08/19  4:00 PM   Specimen: Nasal Mucosa; Nasopharyngeal  Result Value Ref Range Status   MRSA by PCR NEGATIVE NEGATIVE Final    Comment:        The GeneXpert MRSA Assay (FDA approved for NASAL specimens only), is one component of a comprehensive MRSA colonization surveillance program. It is not intended to diagnose MRSA infection nor to guide or monitor treatment for MRSA infections. Performed at [facility], [place], [place] [address]   SARS CORONAVIRUS 2 (TAT 6-24 HRS) Nasopharyngeal Nasopharyngeal Swab     Status: None   Collection Time: 07/14/19 12:00 PM   Specimen: Nasopharyngeal Swab  Result Value Ref Range Status   SARS Coronavirus 2 NEGATIVE NEGATIVE Final    Comment: (NOTE) SARS-CoV-2 target nucleic acids are NOT DETECTED.  The SARS-CoV-2 RNA is generally detectable in upper and lower respiratory specimens during the acute phase of infection. Negative results do not preclude SARS-CoV-2 infection, do not rule out co-infections with other pathogens, and should not be used as the sole basis for treatment or other patient management decisions. Negative results must be combined with clinical observations, patient history, and epidemiological information. The expected result is Negative.  Fact Sheet for Patients: SugarRoll.be  Fact  Sheet for Healthcare  Providers: https://www.woods-mathews.com/  This test is not yet approved or cleared by the [place] FDA and  has been authorized for detection and/or diagnosis of SARS-CoV-2 by FDA under an Emergency Use Authorization (EUA). This EUA will remain  in effect (meaning this test can be used) for the duration of the COVID-19 declaration under Se ction 564(b)(1) of the Act, 21 U.S.C. section 360bbb-3(b)(1), unless the authorization is terminated or revoked sooner.  Performed at [facility], [place] [address] [place], [place] [address]   SARS Coronavirus 2 by RT PCR (hospital order, performed in [facility] hospital lab) Nasopharyngeal Nasopharyngeal Swab     Status: None   Collection Time: 07/15/19 12:28 AM   Specimen: Nasopharyngeal Swab  Result Value Ref Range Status   SARS Coronavirus 2 NEGATIVE NEGATIVE Final    Comment: (NOTE) SARS-CoV-2 target nucleic acids are NOT DETECTED.  The SARS-CoV-2 RNA is generally detectable in upper and lower respiratory specimens during the acute phase of infection. The lowest concentration of SARS-CoV-2 viral copies this assay can detect is 250 copies / mL. A negative result does not preclude SARS-CoV-2 infection and should not be used as the sole basis for treatment or other patient management decisions.  A negative result may occur with improper specimen collection / handling, submission of specimen other than nasopharyngeal swab, presence of viral mutation(s) within the areas targeted by this assay, and inadequate number of viral copies (<250 copies / mL). A negative result must be combined with clinical observations, patient history, and epidemiological information.  Fact Sheet for Patients:   StrictlyIdeas.no  Fact Sheet for Healthcare Providers: BankingDealers.co.za  This test is not yet approved or  cleared by the [place] FDA and has been authorized for detection  and/or diagnosis of SARS-CoV-2 by FDA under an Emergency Use Authorization (EUA).  This EUA will remain in effect (meaning this test can be used) for the duration of the COVID-19 declaration under Section 564(b)(1) of the Act, 21 U.S.C. section 360bbb-3(b)(1), unless the authorization is terminated or revoked sooner.  Performed at [facility], [place] [address], [place], [place] [address]      Radiological Exams on Admission: DG Chest Port 1 View  Result Date: 07/15/2019 CLINICAL DATA:  Short of breath, diaphoretic, tachypnea EXAM: PORTABLE CHEST 1 VIEW COMPARISON:  07/03/2019 FINDINGS: Single frontal view of the chest demonstrates a stable cardiac silhouette. There is chronic scarring of the left hilum. No acute airspace disease or pneumothorax. Minimal blunting of the costophrenic angles consistent with trace bilateral pleural effusions. No acute bony abnormalities. IMPRESSION: 1. Trace bilateral pleural effusions. 2. No acute airspace disease. 3. Chronic scarring left hilum. Electronically Signed   By: Randa Ngo M.D.   On: 07/15/2019 00:47    EKG: Independently reviewed.  Tachycardia.  Assessment/Plan Principal Problem:   Acute respiratory failure with hypoxia (HCC) Active Problems:   COPD exacerbation (HCC)   HTN (hypertension)   Dementia (HCC)   Persistent atrial fibrillation (HCC)   Acute on chronic systolic CHF (congestive heart failure) ([place])    1. Acute respiratory failure with hypoxia likely a combination of COPD and systolic heart failure last EF measured was last month 30 to 35% presently on BiPAP will place patient on Xopenex nebulizer Pulmicort IV steroids.  Closely follow intake output metabolic panel and further dose of Lasix based on the response.  Patient is not on ACE inhibitor due to renal failure. 2. Tachycardia with a history of A.  fib and recently was on a flutter with RVR requiring amiodarone when patient went into process presently on Toprol.  I have  consulted cardiology to get further opinion on the tachycardia.  Patient is on Toprol for now and Xarelto for the anticoagulation.  Check TSH. 3. Diabetes mellitus type 2 on Lantus insulin.  With sliding scale coverage. 4. Chronic anemia follow CBC. 5. Acute on chronic kidney disease stage III.  Closely monitor metabolic panel intake output note that patient did receive 1 dose of IV Lasix.  6. Hyperlipidemia on statins. 7. History of schizophrenia and anxiety on Xanax Seroquel and perphenazine. 8. History of dementia on Namzaric.  Given that patient has acute respiratory failure presently on BiPAP will need more than 2 midnight stay and close monitoring for any further worsening in inpatient status.  I try to reach patient's son with the number provided unable to reach.   DVT prophylaxis: Xarelto. Code Status: Full code. Family Communication: Unable to reach patient's son. Disposition Plan: Back to facility when stable. Consults called: Cardiology. Admission status: Inpatient.   Rise Patience MD [facility] Pager [phone].  If 7PM-7AM, please contact night-coverage [email or web address] Password [facility]  07/15/2019, 6:16 AM

## 2019-07-15 NOTE — Consult Note (Addendum)
Cardiology Consultation:   Patient ID: Cheryl [name]; [id number]; 09-Sep-1940   Admit date: 07/14/2019 Date of Consult: 07/15/2019  Primary Care Provider: Letta Median, MD Primary Cardiologist: Kathlyn Sacramento, MD 07/04/2019 in-hospital Primary Electrophysiologist:  None   Patient Profile:   Cheryl [name] is a 79 y.o. female with a hx of DM, HTN, HLD, COPD, lung CA s/p XRT, dementia, anemia, CKD III, Atrial flutter, who is being seen today for the evaluation of tachycardia at the request of Dr Tyrell Antonio.  History of Present Illness:   Cheryl [name] was hospitalized at [facility] 06/20-07/01/2019 for COPD exacerbation. While there, she was diagnosed with atrial flutter with RVR.  Heart rates were initially difficult to control, requiring amiodarone.    However, she had a 5-second posttermination pause followed by junctional bradycardia and hypotension requiring dopamine and atropine on 06/25. Earlier that day she had received amio 400 mg and Toprol XL 75 mg.  She was then in sinus rhythm for a time, but went back into atrial flutter.   She was changed from amiodarone to metoprolol 25 mg every 6 hours.  She is on Xarelto for anticoagulation.  Her EF was 30-35% with mild RV dysfunction.  She was seen by Dr. Caryl Comes, who felt that risks related to posttermination pauses were minimal because she is largely nonambulatory.  He favored a rate control strategy.  If her renal function remains stable, consider adjunctive digoxin to help with rate control.  He recommended consulting with her 3 children (no one has been able to reach her son who is her legal guardian) regarding decision-making.  She was discharged about 5 PM on 7/1 to [facility].  Her discharge BB was Toprol XL 50 mg qd. She was in atrial flutter at d/c.  [name] was brought to the hospital just after midnight on 7/2 with worsening shortness of breath.  She was tachypneic with increased work of breathing.  She was placed on BiPAP.  She  was tachycardic in atrial flutter.  Cardiology was asked to evaluate her.  Cheryl [name] has dementia at baseline and is not able to provide any information.  Additionally, she is on BiPAP. She is not able to answer many questions. She denies chest pain. She is not aware of the rapid HR.   No other info available from the patient.   Information was obtained from chart notes and staff.   Past Medical History:  Diagnosis Date  . COPD (chronic obstructive pulmonary disease) ([place])   . Dementia ([place])   . Diabetes mellitus without complication ([place])   . GERD (gastroesophageal reflux disease)   . Hyperlipidemia   . Hypertension   . Lung cancer ([place])   . Mental retardation   . Schizophrenia [facility])     Past Surgical History:  Procedure Laterality Date  . ABDOMINAL HYSTERECTOMY       Prior to Admission medications   Medication Sig Start Date End Date Taking? Authorizing Provider  acetaminophen (TYLENOL) 325 MG tablet Take 650 mg by mouth every 4 (four) hours as needed for mild pain or fever.    Yes [provider]  ALPRAZolam (XANAX) 0.25 MG tablet Take 1 tablet (0.25 mg total) by mouth 2 (two) times daily. 07/14/19  Yes Sharen Hones, MD  aspirin 325 MG tablet Take 325 mg by mouth daily.    Yes [provider]  atorvastatin (LIPITOR) 20 MG tablet Take 20 mg by mouth at bedtime.    Yes [provider]  ferrous sulfate 325 (  65 FE) MG tablet Take 325 mg by mouth 2 (two) times daily with a meal.    Yes [provider]  fluticasone (FLONASE) 50 MCG/ACT nasal spray Place 2 sprays into both nostrils daily.   Yes [provider]  Fluticasone-Salmeterol (ADVAIR DISKUS) 250-50 MCG/DOSE AEPB Inhale 1 puff into the lungs 2 (two) times daily.   Yes [provider]  insulin glargine (LANTUS) 100 UNIT/ML injection Inject 0.1 mLs (10 Units total) into the skin daily. 05/17/19  Yes Vashti Hey, MD  levalbuterol Penne Lash) 0.63 MG/3ML nebulizer  solution Take 3 mLs (0.63 mg total) by nebulization every 8 (eight) hours. 07/14/19  Yes Sharen Hones, MD  magnesium hydroxide (MILK OF MAGNESIA) 400 MG/5ML suspension Take 30 mLs by mouth daily as needed for moderate constipation.    Yes [provider]  Memantine HCl-Donepezil HCl (NAMZARIC) 28-10 MG CP24 Take 1 capsule by mouth daily. 05/15/19  Yes Swayze, Ava, DO  metoprolol succinate (TOPROL XL) 50 MG 24 hr tablet Take 1 tablet (50 mg total) by mouth in the morning and at bedtime. Take with or immediately following a meal. 07/14/19 08/13/19 Yes Sharen Hones, MD  modafinil (PROVIGIL) 100 MG tablet Take 1 tablet (100 mg total) by mouth daily. 05/15/19  Yes Swayze, Ava, DO  omeprazole (PRILOSEC) 40 MG capsule Take 40 mg by mouth daily.    Yes [provider]  perphenazine (TRILAFON) 4 MG tablet Take 1 tablet (4 mg total) by mouth 3 (three) times daily. 05/15/19  Yes Swayze, Ava, DO  QUEtiapine (SEROQUEL) 200 MG tablet Take 200 mg by mouth at bedtime.   Yes [provider]  rivaroxaban (XARELTO) 20 MG TABS tablet Take 1 tablet (20 mg total) by mouth daily with supper. 07/14/19  Yes Sharen Hones, MD    Inpatient Medications: Scheduled Meds: . ALPRAZolam  0.25 mg Oral BID  . aspirin  325 mg Oral Daily  . atorvastatin  20 mg Oral QHS  . budesonide (PULMICORT) nebulizer solution  0.25 mg Nebulization BID  . memantine  28 mg Oral Daily   And  . donepezil  10 mg Oral QHS  . ferrous sulfate  325 mg Oral BID WC  . furosemide  40 mg Intravenous Daily  . insulin aspart  0-9 Units Subcutaneous TID WC  . insulin glargine  10 Units Subcutaneous Daily  . ipratropium  0.5 mg Nebulization Q6H  . levalbuterol  0.63 mg Nebulization Q6H  . methylPREDNISolone (SOLU-MEDROL) injection  40 mg Intravenous Q12H  . metoprolol succinate  50 mg Oral Daily  . modafinil  100 mg Oral Daily  . pantoprazole  40 mg Oral Daily  . perphenazine  4 mg Oral TID  . QUEtiapine  200 mg Oral QHS  . rivaroxaban   20 mg Oral Q supper   Continuous Infusions:  PRN Meds: levalbuterol, ondansetron **OR** ondansetron (ZOFRAN) IV  Allergies:   No Known Allergies  Social History:   Social History   Socioeconomic History  . Marital status: Single    Spouse name: Not on file  . Number of children: Not on file  . Years of education: Not on file  . Highest education level: Not on file  Occupational History  . Not on file  Tobacco Use  . Smoking status: Former Smoker    Packs/day: 0.25    Types: Cigarettes    Quit date: 08/30/2016    Years since quitting: 2.8  . Smokeless tobacco: Never Used  Vaping Use  .  Vaping Use: Never used  Substance and Sexual Activity  . Alcohol use: No  . Drug use: No  . Sexual activity: Never  Other Topics Concern  . Not on file  Social History Narrative  . Not on file   Social Determinants of Health   Financial Resource Strain:   . Difficulty of Paying Living Expenses:   Food Insecurity:   . Worried About Charity fundraiser in the Last Year:   . Arboriculturist in the Last Year:   Transportation Needs:   . Film/video editor (Medical):   Marland Kitchen Lack of Transportation (Non-Medical):   Physical Activity:   . Days of Exercise per Week:   . Minutes of Exercise per Session:   Stress:   . Feeling of Stress :   Social Connections:   . Frequency of Communication with Friends and Family:   . Frequency of Social Gatherings with Friends and Family:   . Attends Religious Services:   . Active Member of Clubs or Organizations:   . Attends Archivist Meetings:   Marland Kitchen Marital Status:   Intimate Partner Violence:   . Fear of Current or Ex-Partner:   . Emotionally Abused:   Marland Kitchen Physically Abused:   . Sexually Abused:     Family History:   Family History  Problem Relation Age of Onset  . Breast cancer Neg Hx    Family Status:  Family Status  Relation Name Status  . Neg Hx  (Not Specified)    ROS:  Please see the history of present illness.  All  other ROS reviewed and negative.     Physical Exam/Data:   Vitals:   07/15/19 0628 07/15/19 0700 07/15/19 0745 07/15/19 0915  BP: (!) 144/92 (!) 149/79 (!) 151/100 (!) 174/105  Pulse:  (!) 123 (!) 119 81  Resp: (!) 34 (!) 35 (!) 22 (!) 26  Temp:      TempSrc:      SpO2: 96% 100% 100% (!) 86%  Weight:      Height:       No intake or output data in the 24 hours ending 07/15/19 0942  Last 3 Weights 07/15/2019 07/14/2019 07/13/2019  Weight (lbs) 209 lb 209 lb 3.2 oz 209 lb  Weight (kg) 94.802 kg 94.892 kg 94.802 kg     Body mass index is 35.87 kg/m.   General:  Well nourished, well developed, female in no acute distress HEENT: normal Lymph: no adenopathy Neck: JVD - 9 cm, difficult to assess 2nd BiPAP Endocrine:  No thryomegaly Vascular: No carotid bruits; 4/4 extremity pulses 2+  Cardiac:  normal S1, S2; rapid and irregular R&R; no murmur Lungs:  Rales bases bilaterally, no wheezing, rhonchi   Abd: soft, nontender, no hepatomegaly  Ext: no edema Musculoskeletal:  No deformities, BUE and BLE strength normal and equal Skin: warm and dry  Neuro:  CNs 2-12 intact, no focal abnormalities noted Psych:  Normal affect   EKG:  The EKG was personally reviewed and demonstrates: 07/15/2019 ECG is read as sinus tachycardia, but is atrial flutter with 2-1 conduction and a right bundle branch block, heart rate 122 Telemetry:  Telemetry was personally reviewed and demonstrates: Atrial flutter, RVR   CV studies:   ECHO: 07/04/2019 1. Left ventricular ejection fraction, by estimation, is 30 to 35%. The  left ventricle has moderately decreased function. The left ventricle  demonstrates global hypokinesis. There is mild left ventricular  hypertrophy. Left ventricular diastolic  parameters are  indeterminate.  2. Right ventricular systolic function is mildly reduced. The right  ventricular size is mildly enlarged. There is mildly elevated pulmonary  artery systolic pressure.  3. Left atrial  size was mildly dilated.  4. Right atrial size was moderately dilated.  5. The mitral valve is normal in structure. Trivial mitral valve  regurgitation. No evidence of mitral stenosis.  6. Tricuspid valve regurgitation is moderate.  7. The aortic valve is normal in structure. Aortic valve regurgitation is  trivial. Mild aortic valve sclerosis is present, with no evidence of  aortic valve stenosis.  8. Moderately dilated pulmonary artery.    Laboratory Data:   Chemistry Recent Labs  Lab 07/12/19 0553 07/12/19 0553 07/13/19 0605 07/15/19 0023 07/15/19 0344  NA 142   < > 140 140 142  K 4.3   < > 4.0 4.4 4.6  CL 104  --  102 102  --   CO2 31  --  30 28  --   GLUCOSE 95  --  126* 221*  --   BUN 23  --  24* 24*  --   CREATININE 1.31*  --  1.30* 1.58*  --   CALCIUM 8.7*  --  8.7* 8.9  --   GFRNONAA 39*  --  40* 31*  --   GFRAA 45*  --  46* 36*  --   ANIONGAP 7  --  8 10  --    < > = values in this interval not displayed.    Lab Results  Component Value Date   ALT 15 05/10/2019   AST 18 05/10/2019   ALKPHOS 84 05/10/2019   BILITOT 0.6 05/10/2019   Hematology Recent Labs  Lab 07/13/19 0605 07/13/19 0605 07/15/19 0023 07/15/19 0344 07/15/19 0652  WBC 5.7  --  9.1  --  8.9  RBC 4.05  --  4.10  --  4.36  HGB 11.1*   < > 11.0* 12.9 11.8*  HCT 36.2   < > 37.8 38.0 40.9  MCV 89.4  --  92.2  --  93.8  MCH 27.4  --  26.8  --  27.1  MCHC 30.7  --  29.1*  --  28.9*  RDW 17.1*  --  16.7*  --  16.6*  PLT 300  --  313  --  240   < > = values in this interval not displayed.   Cardiac Enzymes High Sensitivity Troponin:   Recent Labs  Lab 07/03/19 1526 07/03/19 1918 07/15/19 0023 07/15/19 0221 07/15/19 0652  TROPONINIHS 42* 43* 41* 53* 56*      BNP Recent Labs  Lab 07/15/19 0023  BNP 1,618.7*    TSH:  Lab Results  Component Value Date   TSH 1.100 07/15/2019   Lipids: Lab Results  Component Value Date   CHOL 157 07/04/2019   HDL 66 07/04/2019    LDLCALC 80 07/04/2019   TRIG 55 07/04/2019   CHOLHDL 2.4 07/04/2019   HgbA1c: Lab Results  Component Value Date   HGBA1C 6.7 (H) 07/04/2019   Magnesium:  Magnesium  Date Value Ref Range Status  07/08/2019 1.8 1.7 - 2.4 mg/dL Final    Comment:    Performed at [facility], [address] [place], [place] [address]  02/12/2012 1.6 (L) mg/dL Final    Comment:    1.8-2.4 THERAPEUTIC RANGE: 4-7 mg/dL TOXIC: > 10 mg/dL  -----------------------      Radiology/Studies:  DG Chest Port 1 View  Result Date: 07/15/2019  CLINICAL DATA:  Short of breath, diaphoretic, tachypnea EXAM: PORTABLE CHEST 1 VIEW COMPARISON:  07/03/2019 FINDINGS: Single frontal view of the chest demonstrates a stable cardiac silhouette. There is chronic scarring of the left hilum. No acute airspace disease or pneumothorax. Minimal blunting of the costophrenic angles consistent with trace bilateral pleural effusions. No acute bony abnormalities. IMPRESSION: 1. Trace bilateral pleural effusions. 2. No acute airspace disease. 3. Chronic scarring left hilum. Electronically Signed   By: Randa Ngo M.D.   On: 07/15/2019 00:47    Assessment and Plan:   1.  Atrial flutter, RVR - elevated HR is secondary to acute illness - rate control is best option, she will not maintain SR with resp issues - rate control w/ BB as BP will allow - oral rx held due to NPO on BiPAP, can change BB to metop 2.5 mg IV q 6 hr or do IV Dilt, discuss w/ MD - HR 120s in setting of acute illness is ok, but do want to keep some rate control on board.  2. 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 - she has improved w/ IV Lasix - pt on Lasix 40 mg IV BID - follow strict I/O, daily wts, BMET - think can change to PO rx in 1-2 days  3. anticoag - CHA2DS2-VASc = 6 (age x 2, female, CHF, DM, HTN) - consider change to full strength lovenox while NPO - resume  Xarelto when can take po's  Otherwise, per IM Principal Problem:   Acute respiratory failure with hypoxia ([place]) Active Problems:   COPD exacerbation ([facility])   HTN (hypertension)   Dementia ([place])   Persistent atrial fibrillation ([facility])   Acute on chronic systolic CHF (congestive heart failure) ([place])     For questions or updates, please contact [facility] Please consult [email or web address] for contact info under Cardiology/STEMI.   Signed, Rosaria Ferries, PA-C  07/15/2019 9:42 AM As above, patient seen and examined.  Briefly she is a 79 year old female with past medical history of diabetes, hypertension, hyperlipidemia, COPD, lung cancer, dementia, schizophrenia, atrial flutter for evaluation of atrial flutter with rapid ventricular response and CHF.  Patient recently discharged from [facility] following admission for COPD.  She did have atrial flutter at that time.  She received amiodarone and Toprol but apparently had a 5-second posttermination pause.  She was seen by Dr. Caryl Comes and rate control was recommended given her overall medical condition.  She was discharged on Toprol 50 mg daily and Xarelto.  Apparently she developed respiratory distress and was brought to [facility].  She was treated with diuretics with some improvement.  She is noted to be in atrial flutter and cardiology asked to evaluate.  Note she is on BiPAP.  She also does not know why she is here.  She denies dyspnea or chest pain.  Recent echocardiogram showed ejection fraction 30 to 35%, mild left ventricular hypertrophy, mild right ventricular enlargement, biatrial enlargement, moderate tricuspid regurgitation.  Chest x-ray shows trace bilateral pleural effusions.  BUN 24 and creatinine 1.66.  Hemoglobin 13.6. ECG shows atrial flutter, RBBB. BNP 1618.   1 Atrial flutter-patient's heart rate is elevated.  She is presently n.p.o.  We will treat with metoprolol 2.5 mg IV every 6 and increase as needed.  Note she was on Toprol  50 mg daily at time of admission.  Also note she had 5-second posttermination pause on the combination of amiodarone 400  mg and Toprol 50 mg.  We will continue anticoagulation. CHADSvasc 5.  If she cannot take oral Xarelto at present we will treat with Lovenox until she is able.  2 acute on chronic systolic congestive heart failure-I agree with continued IV diuresis.  Follow renal function closely.  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  We will continue beta-blocker.  We can add an ARB later if blood pressure and renal function allow.  I do not think she would be a good candidate for full ischemia evaluation.  4 dementia/schizophrenia/mental retardation-I agree patient's family needs to be contacted for aggressiveness of care discussions.  5 acute on chronic stage III kidney disease-follow renal function closely.  Kirk Ruths, MD

## 2019-07-15 NOTE — Consult Note (Signed)
NAME:  Cheryl [name], MRN:  [id number], DOB:  [birth year], LOS: 0 ADMISSION DATE:  07/14/2019, CONSULTATION DATE: 07/15/2018 REFERRING MD: Triad, CHIEF COMPLAINT: Respiratory failure  Brief History   79 year old with multiple health issues he is readmitted for congestive heart failure and hypoxia  History of present illness   79 year old female with a plethora of health issues who was discharge from [facility] regional hospital 07/14/2019  and treated for atrial fibrillation and congestive heart failure and failure to thrive.  She presents to [facility] 07/15/2019 in florid pulmonary edema hypoxia and nearing the need for intubation.  Pulmonary critical care was called to the bedside.  Noted to be on noninvasive mechanical ventilatory support is sitting up with stable vital signs.  She is having IV Lasix administered.  She may improve with IV Lasix and plus or minus been admitted to the intensive care unit and to the pulmonary critical care service.  We will continue to follow pulmonary status but is she is leaning towards ICU admission and possible intubation.  She does suffer from schizophrenia mental retardation and dementia and is a former smoker.  Past Medical History   Past Medical History:  Diagnosis Date   COPD (chronic obstructive pulmonary disease) (HCC)    Dementia (HCC)    Diabetes mellitus without complication (HCC)    GERD (gastroesophageal reflux disease)    Hyperlipidemia    Hypertension    Lung cancer ([place])    Mental retardation    Schizophrenia ([place])      Significant Hospital Events     Consults:  07/15/2019 pulmonary critical care  Procedures:  07/15/2019 noninvasive mechanical ventilatory support  Significant Diagnostic Tests:  2D echo was noted  Micro Data:    Antimicrobials:    Interim history/subjective:  79 year old female with multiple medical problems who was just discharged from [facility] hospital on 07/14/2019 presents with  hypoxia  Objective   Blood pressure (!) 155/99, pulse (!) 125, temperature 99 F (37.2 C), temperature source Oral, resp. rate (!) 22, height 5\' 4"  (1.626 m), weight 94.8 kg, SpO2 100 %.    FiO2 (%):  [40 %] 40 %  No intake or output data in the 24 hours ending 07/15/19 1020 Filed Weights   07/15/19 0003  Weight: 94.8 kg    Examination: General: Morbid obese female sitting straight up on BiPAP HENT: Short neck.  Moderate JVD Lungs: Coarse rhonchi bibasilar crackles Cardiovascular: Heart sounds are distant irregular Abdomen: Obese soft Extremities: Massive 3-4+ edema Neuro: Does not follow commands.   Resolved Hospital Problem list     Assessment & Plan:  Acute on chronic hypoxic respiratory failure with recent admission and discharge from [place] [facility] on 07/14/2019.  Noted to be hypercarbic with increased work of breathing and volume overload. Noninvasive mechanical ventilatory support as transition to avoid intubation Aggressive diuresis as tolerated Bronchodilators May need intubation Admit to the intensive care unit if she does not respond to current intervention  Chronic atrial fibrillation recent evaluation at [facility] hospital did not require ablation or further interventions treated medically. Consider cardiology consult  Acute on chronic renal injury Lab Results  Component Value Date   CREATININE 1.58 (H) 07/15/2019   CREATININE 1.30 (H) 07/13/2019   CREATININE 1.31 (H) 07/12/2019   CREATININE 1.04 12/15/2013   CREATININE 1.02 08/19/2013   CREATININE 1.15 07/26/2013   Careful diuresis Monitor creatinine and electrolytes balances  Congestive heart failure, last echo 07/04/2019 with reduced right ventricular systolic function.  Left  atrial size mildly dilated.  Right atrial size mildly dilated.  Moderate tricuspid valve regurgitation.  Moderately dilated pulmonary artery EF 30 to 35% left ventricle is moderately decreased function.  The left  ventricle demonstrates global hypokinesis  Diuresis tolerated Consider cardiology consult  Schizophrenia, mental retardation and dementia Medications as tolerated  Best practice:  Diet: NPO Pain/Anxiety/Delirium protocol (if indicated): Schizophrenic medications with stable VAP protocol (if indicated): Not indicated at this time DVT prophylaxis: In place GI prophylaxis: PPI Glucose control: Sliding scale insulin protocol Mobility: Bedrest Code Status: Full Family Communication: No family at bedside patient unable to properly interact Disposition: Most likely will require intensive care admission  Labs   CBC: Recent Labs  Lab 07/09/19 0438 07/09/19 0438 07/10/19 0753 07/10/19 0753 07/13/19 0605 07/15/19 0023 07/15/19 0344 07/15/19 0652 07/15/19 0955  WBC 9.9  --  6.5  --  5.7 9.1  --  8.9  --   NEUTROABS  --   --   --   --   --  6.7  --  8.6*  --   HGB 10.8*   < > 10.2*   < > 11.1* 11.0* 12.9 11.8* 13.6  HCT 33.9*   < > 31.8*   < > 36.2 37.8 38.0 40.9 40.0  MCV 85.6  --  86.4  --  89.4 92.2  --  93.8  --   PLT 379  --  315  --  300 313  --  240  --    < > = values in this interval not displayed.    Basic Metabolic Panel: Recent Labs  Lab 07/10/19 0537 07/10/19 0537 07/11/19 0705 07/11/19 0705 07/12/19 0553 07/13/19 0605 07/15/19 0023 07/15/19 0344 07/15/19 0955  NA 139   < > 140   < > 142 140 140 142 142  K 3.8   < > 4.3   < > 4.3 4.0 4.4 4.6 4.3  CL 107  --  104  --  104 102 102  --   --   CO2 24  --  28  --  31 30 28   --   --   GLUCOSE 67*  --  99  --  95 126* 221*  --   --   BUN 44*  --  30*  --  23 24* 24*  --   --   CREATININE 1.55*  --  1.34*  --  1.31* 1.30* 1.58*  --   --   CALCIUM 8.3*  --  8.8*  --  8.7* 8.7* 8.9  --   --    < > = values in this interval not displayed.   GFR: Estimated Creatinine Clearance: 33.3 mL/min (A) (by C-G formula based on SCr of 1.58 mg/dL (H)). Recent Labs  Lab 07/10/19 0753 07/13/19 0605 07/15/19 0023  07/15/19 0652  WBC 6.5 5.7 9.1 8.9    Liver Function Tests: No results for input(s): AST, ALT, ALKPHOS, BILITOT, PROT, ALBUMIN in the last 168 hours. No results for input(s): LIPASE, AMYLASE in the last 168 hours. No results for input(s): AMMONIA in the last 168 hours.  ABG    Component Value Date/Time   PHART 7.311 (L) 07/15/2019 0955   PCO2ART 65.4 (HH) 07/15/2019 0955   PO2ART 175 (H) 07/15/2019 0955   HCO3 33.0 (H) 07/15/2019 0955   TCO2 35 (H) 07/15/2019 0955   ACIDBASEDEF 1.2 07/03/2019 1100   O2SAT 99.0 07/15/2019 0955     Coagulation Profile: No results for input(s): INR,  PROTIME in the last 168 hours.  Cardiac Enzymes: No results for input(s): CKTOTAL, CKMB, CKMBINDEX, TROPONINI in the last 168 hours.  HbA1C: Hemoglobin A1C  Date/Time Value Ref Range Status  07/10/2011 02:34 AM 6.0 4.2 - 6.3 % Final    Comment:    The American Diabetes Association recommends that a primary goal of therapy should be <7% and that physicians should reevaluate the treatment regimen in patients with HbA1c values consistently >8%.    Hgb A1c MFr Bld  Date/Time Value Ref Range Status  07/04/2019 06:59 AM 6.7 (H) 4.8 - 5.6 % Final    Comment:    (NOTE)         Prediabetes: 5.7 - 6.4         Diabetes: >6.4         Glycemic control for adults with diabetes: <7.0   05/09/2019 09:18 PM 6.6 (H) 4.8 - 5.6 % Final    Comment:    (NOTE) Pre diabetes:          5.7%-6.4% Diabetes:              >6.4% Glycemic control for   <7.0% adults with diabetes     CBG: Recent Labs  Lab 07/13/19 1630 07/13/19 2124 07/14/19 0730 07/14/19 1126 07/15/19 0926  GLUCAP 136* 151* 100* 194* 307*    Review of Systems:   na  Past Medical History  She,  has a past medical history of COPD (chronic obstructive pulmonary disease) ([place]), Dementia ([place]), Diabetes mellitus without complication ([place]), GERD (gastroesophageal reflux disease), Hyperlipidemia, Hypertension, Lung cancer ([place]), Mental  retardation, and Schizophrenia ([place]).   Surgical History    Past Surgical History:  Procedure Laterality Date   ABDOMINAL HYSTERECTOMY       Social History   reports that she quit smoking about 2 years ago. Her smoking use included cigarettes. She smoked 0.25 packs per day. She has never used smokeless tobacco. She reports that she does not drink alcohol and does not use drugs.   Family History   Her family history is negative for Breast cancer.   Allergies No Known Allergies   Home Medications  Prior to Admission medications   Medication Sig Start Date End Date Taking? Authorizing Provider  acetaminophen (TYLENOL) 325 MG tablet Take 650 mg by mouth every 4 (four) hours as needed for mild pain or fever.    Yes [provider]  ALPRAZolam (XANAX) 0.25 MG tablet Take 1 tablet (0.25 mg total) by mouth 2 (two) times daily. 07/14/19  Yes Sharen Hones, MD  aspirin 325 MG tablet Take 325 mg by mouth daily.    Yes [provider]  atorvastatin (LIPITOR) 20 MG tablet Take 20 mg by mouth at bedtime.    Yes [provider]  ferrous sulfate 325 (65 FE) MG tablet Take 325 mg by mouth 2 (two) times daily with a meal.    Yes [provider]  fluticasone (FLONASE) 50 MCG/ACT nasal spray Place 2 sprays into both nostrils daily.   Yes [provider]  Fluticasone-Salmeterol (ADVAIR DISKUS) 250-50 MCG/DOSE AEPB Inhale 1 puff into the lungs 2 (two) times daily.   Yes [provider]  insulin glargine (LANTUS) 100 UNIT/ML injection Inject 0.1 mLs (10 Units total) into the skin daily. 05/17/19  Yes Vashti Hey, MD  levalbuterol Penne Lash) 0.63 MG/3ML nebulizer solution Take 3 mLs (0.63 mg total) by nebulization every 8 (eight) hours. 07/14/19  Yes Sharen Hones, MD  magnesium hydroxide (MILK  OF MAGNESIA) 400 MG/5ML suspension Take 30 mLs by mouth daily as needed for moderate constipation.    Yes [provider]  Memantine HCl-Donepezil  HCl (NAMZARIC) 28-10 MG CP24 Take 1 capsule by mouth daily. 05/15/19  Yes Swayze, Ava, DO  metoprolol succinate (TOPROL XL) 50 MG 24 hr tablet Take 1 tablet (50 mg total) by mouth in the morning and at bedtime. Take with or immediately following a meal. 07/14/19 08/13/19 Yes Sharen Hones, MD  modafinil (PROVIGIL) 100 MG tablet Take 1 tablet (100 mg total) by mouth daily. 05/15/19  Yes Swayze, Ava, DO  omeprazole (PRILOSEC) 40 MG capsule Take 40 mg by mouth daily.    Yes [provider]  perphenazine (TRILAFON) 4 MG tablet Take 1 tablet (4 mg total) by mouth 3 (three) times daily. 05/15/19  Yes Swayze, Ava, DO  QUEtiapine (SEROQUEL) 200 MG tablet Take 200 mg by mouth at bedtime.   Yes [provider]  rivaroxaban (XARELTO) 20 MG TABS tablet Take 1 tablet (20 mg total) by mouth daily with supper. 07/14/19  Yes Sharen Hones, MD     Critical care time: 24 min      Richardson Landry Cordai Rodrigue ACNP Acute Care Nurse Practitioner [place] Please consult Amion 07/15/2019, 10:20 AM

## 2019-07-15 NOTE — Significant Event (Signed)
Rapid Response Event Note  Overview: AF RVR   Initial Focused Assessment: Nurse was concerned that patient's HR did not improve after Cardizem was started. Upon arrival, patient was alert and oriented to self and place (history of dementia). She denied any chest pain and was breathing comfortably on BIPAP. Normal respiratory rate and effort on BIPAP. HR 120 - ST - appears regular. BP 146/81(101) 100% on BIPAP 40% RR 18.   Interventions: -- No RRT Interventions -- Diltiazem infusion increased 12.5 mg/hr  Plan of Care: -- Monitor VS - BP/HR -- I asked the nurse to inform the MD on call and asked for HR parameters.  -- I do not feel that the patient needs to higher level of care currently. If the staff nurses feel like the patient needs to be transferred they can speak with the medical providers.   Event Summary:  Start Time 1800 End Time 1830   Djuan Talton R

## 2019-07-15 NOTE — Progress Notes (Signed)
Inpatient Diabetes Program Recommendations  AACE/ADA: New Consensus Statement on Inpatient Glycemic Control (2015)  Target Ranges:  Prepandial:   less than 140 mg/dL      Peak postprandial:   less than 180 mg/dL (1-2 hours)      Critically ill patients:  140 - 180 mg/dL   Results for Cheryl [name], Cheryl [name] (MRN [id number]) as of 07/15/2019 12:51  Ref. Range 07/15/2019 09:26 07/15/2019 10:42 07/15/2019 11:50  Glucose-Capillary Latest Ref Range: 70 - 99 mg/dL 307 (H)  7 units NOVOLOG +  10 units LANTUS  293 (H) 273 (H)  5 units NOVOLOG     Admit with Acute respiratory failure/ COPD/ CHF (was just admitted to [facility] a few days ago for the same and then d/c'd 07/01 to SNF--back to ED from SNF yest for the same issues)  History: DM, COPD, Dementia, Schizophrenia   SNF Meds: Lantus 10 units Daily  Current Orders: Lantus 10 units Daily      Novolog Sensitive Correction Scale/ SSI (0-9 units) TID AC     Lantus just started this AM.   Pt was given 125 mg Solumedrol X 1 dose last night at 12am and now getting Solumedrol 40 mg BID.  CBGs quite elevated today.    MD- Given CBG elevations and Steroid Rx, please consider the following:  1. Increase Novolog SSI to the Moderate scale (0-15 units)  2. Start Novolog Meal Coverage: Novolog 4 units TID with meals  (Please add the following Hold Parameters: Hold if pt eats <50% of meal, Hold if pt NPO)  3. If AM CBG remains elevated tomorrow AM (07/03), please consider increasing Lantus further     --Will follow patient during hospitalization--  Wyn Quaker RN, MSN, CDE Diabetes Coordinator Inpatient Glycemic Control Team Team Pager: [phone] (8a-5p)

## 2019-07-16 LAB — BASIC METABOLIC PANEL
Anion gap: 13 (ref 5–15)
BUN: 22 mg/dL (ref 8–23)
CO2: 33 mmol/L — ABNORMAL HIGH (ref 22–32)
Calcium: 9 mg/dL (ref 8.9–10.3)
Chloride: 98 mmol/L (ref 98–111)
Creatinine, Ser: 1.37 mg/dL — ABNORMAL HIGH (ref 0.44–1.00)
GFR calc Af Amer: 43 mL/min — ABNORMAL LOW (ref >60)
GFR calc non Af Amer: 37 mL/min — ABNORMAL LOW (ref >60)
Glucose, Bld: 142 mg/dL — ABNORMAL HIGH (ref 70–99)
Potassium: 4 mmol/L (ref 3.5–5.1)
Sodium: 144 mmol/L (ref 135–145)

## 2019-07-16 LAB — GLUCOSE, CAPILLARY
Glucose-Capillary: 138 mg/dL — ABNORMAL HIGH (ref 70–99)
Glucose-Capillary: 126 mg/dL — ABNORMAL HIGH (ref 70–99)
Glucose-Capillary: 204 mg/dL — ABNORMAL HIGH (ref 70–99)
Glucose-Capillary: 186 mg/dL — ABNORMAL HIGH (ref 70–99)

## 2019-07-16 LAB — BLOOD GAS, ARTERIAL
Acid-Base Excess: 10.7 mmol/L — ABNORMAL HIGH (ref 0.0–2.0)
Bicarbonate: 36 mmol/L — ABNORMAL HIGH (ref 20.0–28.0)
Drawn by: 51702
FIO2: 40
O2 Saturation: 98.5 %
Patient temperature: 36.7
pCO2 arterial: 59.9 mmHg — ABNORMAL HIGH (ref 32.0–48.0)
pH, Arterial: 7.394 (ref 7.350–7.450)
pO2, Arterial: 120 mmHg — ABNORMAL HIGH (ref 83.0–108.0)

## 2019-07-16 MED ORDER — LEVALBUTEROL HCL 0.63 MG/3ML IN NEBU
0.6300 mg | INHALATION_SOLUTION | Freq: Three times a day (TID) | RESPIRATORY_TRACT | Status: DC
  Administered 2019-07-17 – 2019-07-22 (×18): 0.63 mg via RESPIRATORY_TRACT
  Filled 2019-07-16 (×19): qty 3

## 2019-07-16 MED ORDER — IPRATROPIUM BROMIDE 0.02 % IN SOLN
0.5000 mg | Freq: Three times a day (TID) | RESPIRATORY_TRACT | Status: DC
  Administered 2019-07-17 – 2019-07-23 (×19): 0.5 mg via RESPIRATORY_TRACT
  Filled 2019-07-16 (×20): qty 2.5

## 2019-07-16 MED ORDER — ENOXAPARIN SODIUM 100 MG/ML ~~LOC~~ SOLN
90.0000 mg | Freq: Two times a day (BID) | SUBCUTANEOUS | Status: DC
  Administered 2019-07-16 (×2): 90 mg via SUBCUTANEOUS
  Filled 2019-07-16 (×3): qty 1

## 2019-07-16 NOTE — Progress Notes (Signed)
SLP Cancellation Note  Patient Details Name: Cheryl [name] MRN: [id number] DOB: 04-19-1940   Cancelled treatment:       Reason Eval/Treat Not Completed: Medical issues which prohibited therapy. Patient on continuous BiPap secondary to respiratory distress. Will check next date for change in status.  Sonia Baller, MA, CCC-SLP Speech Therapy [facility] Acute Rehab

## 2019-07-16 NOTE — Progress Notes (Signed)
Pt taken off bipap and placed on 5L HFNC at this time. VS within normal limits. Pt in no distress, no increased WOB. RT will closely monitor pt in case pt needs bipap again

## 2019-07-16 NOTE — Progress Notes (Signed)
ANTICOAGULATION CONSULT NOTE - Initial Consult  Pharmacy Consult for Lovenox  Indication: atrial fibrillation  No Known Allergies  Patient Measurements: Height: 5\' 4"  (162.6 cm) Weight: 92.5 kg (203 lb 14.8 oz) IBW/kg (Calculated) : 54.7  Vital Signs: Temp: 98.8 F (37.1 C) (07/03 0339) Temp Source: Axillary (07/03 0339) BP: 120/99 (07/03 0708) Pulse Rate: 89 (07/03 0708)  Labs: Recent Labs    07/15/19 0023 07/15/19 0023 07/15/19 0221 07/15/19 0344 07/15/19 0344 07/15/19 6599 07/15/19 0831 07/15/19 0950 07/15/19 0955 07/16/19 0429  HGB 11.0*   < >  --  12.9   < > 11.8*  --   --  13.6  --   HCT 37.8   < >  --  38.0  --  40.9  --   --  40.0  --   PLT 313  --   --   --   --  240  --   --   --   --   CREATININE 1.58*   < >  --   --   --   --  1.56* 1.66*  --  1.37*  TROPONINIHS 41*  --  53*  --   --  56*  --   --   --   --    < > = values in this interval not displayed.    Estimated Creatinine Clearance: 37.9 mL/min (A) (by C-G formula based on SCr of 1.37 mg/dL (H)).   Medical History: Past Medical History:  Diagnosis Date  . COPD (chronic obstructive pulmonary disease) ([place])   . Dementia ([place])   . Diabetes mellitus without complication ([place])   . GERD (gastroesophageal reflux disease)   . Hyperlipidemia   . Hypertension   . Lung cancer ([place])   . Mental retardation   . Schizophrenia [facility])     Assessment:  79 year old female with a history of atrial fibrillation on Xarelto prior to admission (last dose received 6/30) who is now NPO on BiPAP. Cardiology requested change from Xarelto to Enoxaparin while NPO. SCr down to 1.37 (baseline 1.3 -1.5). We will change Lovenox to BID dosing.    Goal of Therapy:  Anti-Xa level 0.6-1 units/ml 4hrs after LMWH dose given Monitor platelets by anticoagulation protocol: Yes   Plan:  Change Lovenox 100mg  SQ q12  Monitor for ability to take orals and transition back to Xarelto.   Onnie Boer, PharmD, BCIDP, AAHIVP,  CPP Infectious Disease Pharmacist 07/16/2019 7:45 AM

## 2019-07-16 NOTE — Progress Notes (Signed)
PROGRESS NOTE    Cheryl [name]  YQM:[id number] DOB: 1940-03-19 DOA: 07/14/2019 PCP: Letta Median, MD   Brief Narrative: 79 year old COPD, Systolic Heart EF 52-84 %, schizophrenia, CKD stage B , recently discharge from [facility] treated for A flutter, COPD HF exacerbation, discharge day prior to admission. Presents with worsening SOB, Respiratory failure.     Assessment & Plan:   Principal Problem:   Acute respiratory failure with hypoxia (HCC) Active Problems:   COPD exacerbation (HCC)   HTN (hypertension)   Dementia (HCC)   Persistent atrial fibrillation (HCC)   Acute on chronic systolic CHF (congestive heart failure) (HCC)  1-Acute Hypoxic Respiratory Failure, hypercapnic.  Secondary to Heart failure exacerbation,  COPD exacerbation.  Continue with nebulizer treatment.  Patient place on BIPAP.  CCM consulted.  IV lasix 40 mg IV BID>  Strict I and o.  Discontinue solumedrol per CCM recommendations.  Continue with BIPAP at HS and PRN Improving. Remove BIPAP and monitor.   2-A flutter  RVR;  Cardiology order IV metoprolol. Plan to start cardizem gtt due to persistent tachycardia.  She has a 5 second postermination pause while on amiodarone and toprol.  On lovenox.  TSH normal.  HR better , appreciate cardiology assistance.   Diabetes Type 2;  SSI.   Chronic anemia;  Follow hb.  Hb stable at 11.   Acute on Chronic renal failure stage III; b, cr baseline 1.3  Monitor on lasix.   Hyperlipidemia; continue with statins.  History of schizophrenia; continue with xanax and seraquel.   History of dementia; Namzaric.  History of lung cancer S./p radiation.   Estimated body mass index is 35 kg/m as calculated from the following:   Height as of this encounter: 5\' 4"  (1.626 m).   Weight as of this encounter: 92.5 kg.   DVT prophylaxis: Lovenox Code Status: Full code Family Communication: Son updated over phone7/02 Disposition Plan:  Status is:  Inpatient  Remains inpatient appropriate because:Hemodynamically unstable   Dispo: The patient is from: Home              Anticipated d/c is to: SNF              Anticipated d/c date is: 3 days              Patient currently is not medically stable to d/c.        Consultants:   CCM  Cardiology   Procedures:   None  Antimicrobials:    Subjective: Feeling better, she was on BIPAP during my evaluation.   Objective: Vitals:   07/16/19 0251 07/16/19 0339 07/16/19 0708 07/16/19 0837  BP:  (!) 121/94 (!) 120/99 (!) 152/90  Pulse: 85 86 89 78  Resp: 15 18 (!) 24 15  Temp:  98.8 F (37.1 C)  98.6 F (37 C)  TempSrc:  Axillary  Axillary  SpO2: 100% 100% 100% 100%  Weight:  92.5 kg    Height:        Intake/Output Summary (Last 24 hours) at 07/16/2019 1034 Last data filed at 07/16/2019 1324 Gross per 24 hour  Intake 360.57 ml  Output 1300 ml  Net -939.43 ml   Filed Weights   07/15/19 0003 07/16/19 0339  Weight: 94.8 kg 92.5 kg    Examination:  General exam: NAD Respiratory system: BIPAP, BL crackles Gastrointestinal system: BS present, soft, nt Central nervous system: Alert Extremities: Symmetric power Skin: No rashes    Data Reviewed: I have personally reviewed  following labs and imaging studies  CBC: Recent Labs  Lab 07/10/19 0753 07/10/19 0753 07/13/19 0605 07/15/19 0023 07/15/19 0344 07/15/19 0652 07/15/19 0955  WBC 6.5  --  5.7 9.1  --  8.9  --   NEUTROABS  --   --   --  6.7  --  8.6*  --   HGB 10.2*   < > 11.1* 11.0* 12.9 11.8* 13.6  HCT 31.8*   < > 36.2 37.8 38.0 40.9 40.0  MCV 86.4  --  89.4 92.2  --  93.8  --   PLT 315  --  300 313  --  240  --    < > = values in this interval not displayed.   Basic Metabolic Panel: Recent Labs  Lab 07/13/19 0605 07/13/19 0605 07/15/19 0023 07/15/19 0023 07/15/19 0344 07/15/19 0831 07/15/19 0950 07/15/19 0955 07/16/19 0429  NA 140   < > 140   < > 142 141 142 142 144  K 4.0   < > 4.4   < >  4.6 4.5 4.3 4.3 4.0  CL 102  --  102  --   --  102 101  --  98  CO2 30  --  28  --   --  24 26  --  33*  GLUCOSE 126*  --  221*  --   --  281* 316*  --  142*  BUN 24*  --  24*  --   --  24* 24*  --  22  CREATININE 1.30*  --  1.58*  --   --  1.56* 1.66*  --  1.37*  CALCIUM 8.7*  --  8.9  --   --  8.8* 8.9  --  9.0   < > = values in this interval not displayed.   GFR: Estimated Creatinine Clearance: 37.9 mL/min (A) (by C-G formula based on SCr of 1.37 mg/dL (H)). Liver Function Tests: No results for input(s): AST, ALT, ALKPHOS, BILITOT, PROT, ALBUMIN in the last 168 hours. No results for input(s): LIPASE, AMYLASE in the last 168 hours. No results for input(s): AMMONIA in the last 168 hours. Coagulation Profile: No results for input(s): INR, PROTIME in the last 168 hours. Cardiac Enzymes: No results for input(s): CKTOTAL, CKMB, CKMBINDEX, TROPONINI in the last 168 hours. BNP (last 3 results) No results for input(s): PROBNP in the last 8760 hours. HbA1C: No results for input(s): HGBA1C in the last 72 hours. CBG: Recent Labs  Lab 07/15/19 1042 07/15/19 1150 07/15/19 1600 07/15/19 2109 07/16/19 0836  GLUCAP 293* 273* 224* 171* 138*   Lipid Profile: No results for input(s): CHOL, HDL, LDLCALC, TRIG, CHOLHDL, LDLDIRECT in the last 72 hours. Thyroid Function Tests: Recent Labs    07/15/19 0652  TSH 1.100   Anemia Panel: No results for input(s): VITAMINB12, FOLATE, FERRITIN, TIBC, IRON, RETICCTPCT in the last 72 hours. Sepsis Labs: No results for input(s): PROCALCITON, LATICACIDVEN in the last 168 hours.  Recent Results (from the past 240 hour(s))  MRSA PCR Screening     Status: None   Collection Time: 07/08/19  4:00 PM   Specimen: Nasal Mucosa; Nasopharyngeal  Result Value Ref Range Status   MRSA by PCR NEGATIVE NEGATIVE Final    Comment:        The GeneXpert MRSA Assay (FDA approved for NASAL specimens only), is one component of a comprehensive MRSA  colonization surveillance program. It is not intended to diagnose MRSA infection nor to guide or monitor treatment  for MRSA infections. Performed at [facility], [place], [place] [address]   SARS CORONAVIRUS 2 (TAT 6-24 HRS) Nasopharyngeal Nasopharyngeal Swab     Status: None   Collection Time: 07/14/19 12:00 PM   Specimen: Nasopharyngeal Swab  Result Value Ref Range Status   SARS Coronavirus 2 NEGATIVE NEGATIVE Final    Comment: (NOTE) SARS-CoV-2 target nucleic acids are NOT DETECTED.  The SARS-CoV-2 RNA is generally detectable in upper and lower respiratory specimens during the acute phase of infection. Negative results do not preclude SARS-CoV-2 infection, do not rule out co-infections with other pathogens, and should not be used as the sole basis for treatment or other patient management decisions. Negative results must be combined with clinical observations, patient history, and epidemiological information. The expected result is Negative.  Fact Sheet for Patients: SugarRoll.be  Fact Sheet for Healthcare Providers: https://www.woods-mathews.com/  This test is not yet approved or cleared by the [place] FDA and  has been authorized for detection and/or diagnosis of SARS-CoV-2 by FDA under an Emergency Use Authorization (EUA). This EUA will remain  in effect (meaning this test can be used) for the duration of the COVID-19 declaration under Se ction 564(b)(1) of the Act, 21 U.S.C. section 360bbb-3(b)(1), unless the authorization is terminated or revoked sooner.  Performed at [facility], [place] [address], [place], [place] [address]   SARS Coronavirus 2 by RT PCR (hospital order, performed in [facility] hospital lab) Nasopharyngeal Nasopharyngeal Swab     Status: None   Collection Time: 07/15/19 12:28 AM   Specimen: Nasopharyngeal Swab  Result Value Ref Range Status   SARS Coronavirus 2  NEGATIVE NEGATIVE Final    Comment: (NOTE) SARS-CoV-2 target nucleic acids are NOT DETECTED.  The SARS-CoV-2 RNA is generally detectable in upper and lower respiratory specimens during the acute phase of infection. The lowest concentration of SARS-CoV-2 viral copies this assay can detect is 250 copies / mL. A negative result does not preclude SARS-CoV-2 infection and should not be used as the sole basis for treatment or other patient management decisions.  A negative result may occur with improper specimen collection / handling, submission of specimen other than nasopharyngeal swab, presence of viral mutation(s) within the areas targeted by this assay, and inadequate number of viral copies (<250 copies / mL). A negative result must be combined with clinical observations, patient history, and epidemiological information.  Fact Sheet for Patients:   StrictlyIdeas.no  Fact Sheet for Healthcare Providers: BankingDealers.co.za  This test is not yet approved or  cleared by the [place] FDA and has been authorized for detection and/or diagnosis of SARS-CoV-2 by FDA under an Emergency Use Authorization (EUA).  This EUA will remain in effect (meaning this test can be used) for the duration of the COVID-19 declaration under Section 564(b)(1) of the Act, 21 U.S.C. section 360bbb-3(b)(1), unless the authorization is terminated or revoked sooner.  Performed at [facility], [place] [address] [place], [place] [address]          Radiology Studies: DG Chest Port 1 View  Result Date: 07/15/2019 CLINICAL DATA:  Short of breath, diaphoretic, tachypnea EXAM: PORTABLE CHEST 1 VIEW COMPARISON:  07/03/2019 FINDINGS: Single frontal view of the chest demonstrates a stable cardiac silhouette. There is chronic scarring of the left hilum. No acute airspace disease or pneumothorax. Minimal blunting of the costophrenic angles consistent with trace  bilateral pleural effusions. No acute bony abnormalities. IMPRESSION: 1. Trace bilateral pleural effusions. 2.  No acute airspace disease. 3. Chronic scarring left hilum. Electronically Signed   By: Randa Ngo M.D.   On: 07/15/2019 00:47        Scheduled Meds: . ALPRAZolam  0.25 mg Oral BID  . atorvastatin  20 mg Oral QHS  . budesonide (PULMICORT) nebulizer solution  0.25 mg Nebulization BID  . memantine  28 mg Oral Daily   And  . donepezil  10 mg Oral QHS  . enoxaparin (LOVENOX) injection  90 mg Subcutaneous Q12H  . ferrous sulfate  325 mg Oral BID WC  . furosemide  40 mg Intravenous Q12H  . insulin aspart  0-9 Units Subcutaneous TID WC  . insulin glargine  10 Units Subcutaneous Daily  . ipratropium  0.5 mg Nebulization Q6H  . levalbuterol  0.63 mg Nebulization Q6H  . metoprolol tartrate  2.5 mg Intravenous Q6H  . modafinil  100 mg Oral Daily  . pantoprazole  40 mg Oral Daily  . perphenazine  4 mg Oral TID  . QUEtiapine  200 mg Oral QHS   Continuous Infusions: . diltiazem (CARDIZEM) infusion 12.5 mg/hr (07/16/19 0216)     LOS: 1 day    Time spent: 35 minutes    Kassidie Hendriks A Hoyle Barkdull, MD [facility]   If 7PM-7AM, please contact night-coverage [email or web address]  07/16/2019, 10:34 AM

## 2019-07-16 NOTE — Evaluation (Signed)
Clinical/Bedside Swallow Evaluation Patient Details  Name: Cheryl [name] MRN: [id number] Date of Birth: 26-Feb-1940  Today's Date: 07/16/2019 Time: SLP Start Time (ACUTE ONLY): 1210 SLP Stop Time (ACUTE ONLY): 1230 SLP Time Calculation (min) (ACUTE ONLY): 20 min  Past Medical History:  Past Medical History:  Diagnosis Date  . COPD (chronic obstructive pulmonary disease) ([place])   . Dementia ([place])   . Diabetes mellitus without complication ([place])   . GERD (gastroesophageal reflux disease)   . Hyperlipidemia   . Hypertension   . Lung cancer ([place])   . Mental retardation   . Schizophrenia [facility])    Past Surgical History:  Past Surgical History:  Procedure Laterality Date  . ABDOMINAL HYSTERECTOMY     HPI:  Patient is a 79 y.o. with PMH: COPD, systolic Heart EF 57-84%, schizophrenia, CKD stage B, recent discharge from [facility] with atrial flutter, COPD HF exacerbation. She was admitted to [facility] with worsening SOB, respiratory failure. She was placed on BiPap continously and respiratory therapy was able to switch her to 5L HFNC at 1056 on 7/3.   Assessment / Plan / Recommendation Clinical Impression  Patient presents with a mild oral phase dysphagia characterized by oral residuals post swallow with regular texture solids, but with full clearance with subsequent swallows and sips of liquids. Patient is very impulsive with PO intake for both liquids and solids. No overt s/s of aspiration or penetration observed even with large volume, successive straw sips of thin liquids. RR remained at 20 and SpO2 at 98-100%. SLP Visit Diagnosis: Dysphagia, unspecified (R13.10)   MD would like patient to have more time off BiPap  before initiating oral diet.    Aspiration Risk  Mild aspiration risk    Diet Recommendation Dysphagia 3 (Mech soft);Thin liquid   Liquid Administration via: Cup;Straw Medication Administration: Whole meds with puree Supervision: Patient able to  self feed;Intermittent supervision to cue for compensatory strategies Compensations: Minimize environmental distractions;Slow rate;Small sips/bites Postural Changes: Seated upright at 90 degrees    Other  Recommendations Oral Care Recommendations: Oral care BID;Staff/trained caregiver to provide oral care   Follow up Recommendations None      Frequency and Duration min 2x/week  1 week       Prognosis Prognosis for Safe Diet Advancement: Good      Swallow Study   General Date of Onset: 07/15/19 HPI: Patient is a 79 y.o. with PMH: COPD, systolic Heart EF 69-62%, schizophrenia, CKD stage B, recent discharge from [facility] with atrial flutter, COPD HF exacerbation. She was admitted to [facility] with worsening SOB, respiratory failure. She was placed on BiPap continously and respiratory therapy was able to switch her to 5L HFNC at 1056 on 7/3. Type of Study: Bedside Swallow Evaluation Previous Swallow Assessment: N/A Diet Prior to this Study: NPO Temperature Spikes Noted: No Respiratory Status: Nasal cannula (HFNC 5L) History of Recent Intubation: No Behavior/Cognition: Alert;Pleasant mood;Cooperative;Confused Oral Cavity Assessment: Within Functional Limits Oral Care Completed by SLP: Yes Oral Cavity - Dentition: Adequate natural dentition Vision: Functional for self-feeding Self-Feeding Abilities: Able to feed self;Needs set up Patient Positioning: Upright in bed;Postural control adequate for testing Baseline Vocal Quality: Normal Volitional Cough: Cognitively unable to elicit Volitional Swallow: Able to elicit    Oral/Motor/Sensory Function Overall Oral Motor/Sensory Function: Mild impairment Facial ROM: Within Functional Limits Facial Symmetry: Within Functional Limits Facial Strength: Within Functional Limits Facial Sensation: Within Functional Limits Lingual ROM: Reduced right;Reduced left (mildly decreased lingual ROM) Lingual  Symmetry: Within  Functional Limits Lingual Strength: Within Functional Limits Lingual Sensation: Within Functional Limits Velum: Within Functional Limits Mandible: Within Functional Limits   Ice Chips     Thin Liquid Thin Liquid: Within functional limits Presentation: Straw;Self Fed Other Comments: no overt s/s of aspiration or penetration, patient consumed large volume, successive straw sips of thin liquids; she is impulsive with oral PO intake    Nectar Thick     Honey Thick     Puree Puree: Within functional limits   Solid     Solid: Impaired Oral Phase Functional Implications: Oral residue Other Comments: mild amount of oral residuals post initial mastication and swallow; patient is impulsive with oral PO intake      Sonia Baller, MA, [facility]

## 2019-07-16 NOTE — Progress Notes (Signed)
Status update to Dr Marlowe Sax regarding B/p outside of ordered parameter, no new orders at this time, will cont to monitor.

## 2019-07-16 NOTE — Progress Notes (Signed)
Progress Note  Patient Name: Cheryl [name] Date of Encounter: 07/16/2019  [facility] Cardiologist: Kathlyn Sacramento, MD  Subjective   Difficult historian due to dementia, schizophrenia; denies CP or dyspnea; on Bipap  Inpatient Medications    Scheduled Meds: . ALPRAZolam  0.25 mg Oral BID  . atorvastatin  20 mg Oral QHS  . budesonide (PULMICORT) nebulizer solution  0.25 mg Nebulization BID  . memantine  28 mg Oral Daily   And  . donepezil  10 mg Oral QHS  . enoxaparin (LOVENOX) injection  90 mg Subcutaneous Q12H  . ferrous sulfate  325 mg Oral BID WC  . furosemide  40 mg Intravenous Q12H  . insulin aspart  0-9 Units Subcutaneous TID WC  . insulin glargine  10 Units Subcutaneous Daily  . ipratropium  0.5 mg Nebulization Q6H  . levalbuterol  0.63 mg Nebulization Q6H  . metoprolol tartrate  2.5 mg Intravenous Q6H  . modafinil  100 mg Oral Daily  . pantoprazole  40 mg Oral Daily  . perphenazine  4 mg Oral TID  . QUEtiapine  200 mg Oral QHS   Continuous Infusions: . diltiazem (CARDIZEM) infusion 12.5 mg/hr (07/16/19 0216)   PRN Meds: levalbuterol, ondansetron **OR** ondansetron (ZOFRAN) IV   Vital Signs    Vitals:   07/16/19 0251 07/16/19 0339 07/16/19 0708 07/16/19 0837  BP:  (!) 121/94 (!) 120/99 (!) 152/90  Pulse: 85 86 89 78  Resp: 15 18 (!) 24 15  Temp:  98.8 F (37.1 C)  98.6 F (37 C)  TempSrc:  Axillary  Axillary  SpO2: 100% 100% 100% 100%  Weight:  92.5 kg    Height:        Intake/Output Summary (Last 24 hours) at 07/16/2019 0956 Last data filed at 07/16/2019 0643 Gross per 24 hour  Intake 360.57 ml  Output 1300 ml  Net -939.43 ml   Last 3 Weights 07/16/2019 07/15/2019 07/14/2019  Weight (lbs) 203 lb 14.8 oz 209 lb 209 lb 3.2 oz  Weight (kg) 92.5 kg 94.802 kg 94.892 kg      Telemetry    Atrial flutter rate improved- Personally Reviewed  Physical Exam   GEN: No acute distress.  On Bipap Neck: No JVD Cardiac: irregular Respiratory: Mild  rhonchi GI: Soft, nontender, non-distended  MS: trace edema Neuro:  Nonfocal  Psych: Repetitive   Labs    High Sensitivity Troponin:   Recent Labs  Lab 07/03/19 1526 07/03/19 1918 07/15/19 0023 07/15/19 0221 07/15/19 0652  TROPONINIHS 42* 43* 41* 53* 56*      Chemistry Recent Labs  Lab 07/15/19 0831 07/15/19 0831 07/15/19 0950 07/15/19 0955 07/16/19 0429  NA 141   < > 142 142 144  K 4.5   < > 4.3 4.3 4.0  CL 102  --  101  --  98  CO2 24  --  26  --  33*  GLUCOSE 281*  --  316*  --  142*  BUN 24*  --  24*  --  22  CREATININE 1.56*  --  1.66*  --  1.37*  CALCIUM 8.8*  --  8.9  --  9.0  GFRNONAA 32*  --  29*  --  37*  GFRAA 37*  --  34*  --  43*  ANIONGAP 15  --  15  --  13   < > = values in this interval not displayed.     Hematology Recent Labs  Lab 07/13/19 [phone] 07/13/19 1884 07/15/19  0023 07/15/19 0023 07/15/19 0344 07/15/19 0652 07/15/19 0955  WBC 5.7  --  9.1  --   --  8.9  --   RBC 4.05  --  4.10  --   --  4.36  --   HGB 11.1*   < > 11.0*   < > 12.9 11.8* 13.6  HCT 36.2   < > 37.8   < > 38.0 40.9 40.0  MCV 89.4  --  92.2  --   --  93.8  --   MCH 27.4  --  26.8  --   --  27.1  --   MCHC 30.7  --  29.1*  --   --  28.9*  --   RDW 17.1*  --  16.7*  --   --  16.6*  --   PLT 300  --  313  --   --  240  --    < > = values in this interval not displayed.    BNP Recent Labs  Lab 07/15/19 0023  BNP 1,618.7*     Radiology    DG Chest [facility] 1 View  Result Date: 07/15/2019 CLINICAL DATA:  Short of breath, diaphoretic, tachypnea EXAM: PORTABLE CHEST 1 VIEW COMPARISON:  07/03/2019 FINDINGS: Single frontal view of the chest demonstrates a stable cardiac silhouette. There is chronic scarring of the left hilum. No acute airspace disease or pneumothorax. Minimal blunting of the costophrenic angles consistent with trace bilateral pleural effusions. No acute bony abnormalities. IMPRESSION: 1. Trace bilateral pleural effusions. 2. No acute airspace disease. 3.  Chronic scarring left hilum. Electronically Signed   By: Randa Ngo M.D.   On: 07/15/2019 00:47    Patient Profile     79 year old female with past medical history of diabetes, hypertension, hyperlipidemia, COPD, lung cancer, dementia, schizophrenia, atrial flutter for evaluation of atrial flutter with rapid ventricular response and CHF.  Patient recently discharged from [facility] following admission for COPD.  She did have atrial flutter at that time.  She received amiodarone and Toprol but apparently had a 5-second posttermination pause.  She was seen by Dr. Caryl Comes and rate control was recommended given her overall medical condition.  She was discharged on Toprol 50 mg daily and Xarelto.  Apparently she developed respiratory distress and was brought to [facility].  She was treated with diuretics with some improvement.  She is noted to be in atrial flutter and cardiology asked to evaluate.  Recent echocardiogram showed ejection fraction 30 to 35%, mild left ventricular hypertrophy, mild right ventricular enlargement, biatrial enlargement, moderate tricuspid regurgitation.   Assessment & Plan    1 atrial flutter-patient's heart rate is much improved.  We will continue IV Cardizem for now.  Will transition to oral medications later when she is off BiPAP.  Continue Lovenox until she is able to take oral Xarelto.  2 acute on chronic systolic congestive heart failure-continue Lasix at present dose.  Follow renal function closely.  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  Will transition back to beta-blockade later once she is off BiPAP.  Will add ARB if blood pressure and renal function allow.  Given overall medical condition I do not think she would be a good candidate for ischemia evaluation.  4 acute on chronic stage III kidney disease-follow renal function closely.  5 dementia/schizophrenia/mental retardation-as outlined previously it would be worthwhile to continue efforts  to contact patient's family for discussions concerning aggressiveness of care.  For questions or updates, please contact [place]  Please consult [email or web address] for contact info under        Signed, Kirk Ruths, MD  07/16/2019, 9:56 AM

## 2019-07-16 NOTE — Progress Notes (Signed)
NAME:  Cheryl [name], MRN:  [id number], DOB:  1940/05/01, LOS: 1 ADMISSION DATE:  07/14/2019, CONSULTATION DATE: 07/15/2018 REFERRING MD: [facility]ad, CHIEF COMPLAINT: Respiratory failure  Brief History   79 year old with multiple health issues he is readmitted for congestive heart failure and hypoxia  History of present illness   79 year old female with a plethora of health issues who was discharge from [facility] 07/14/2019  and treated for atrial fibrillation and congestive heart failure and failure to thrive.  She presents to [facility] 07/15/2019 in florid pulmonary edema hypoxia and nearing the need for intubation.  Pulmonary critical care was called to the bedside.  Noted to be on noninvasive mechanical ventilatory support is sitting up with stable vital signs.  She is having IV Lasix administered.  She may improve with IV Lasix and plus or minus been admitted to the intensive care unit and to the pulmonary critical care service.  We will continue to follow pulmonary status but is she is leaning towards ICU admission and possible intubation.  She does suffer from schizophrenia mental retardation and dementia and is a former smoker.  Past Medical History   Past Medical History:  Diagnosis Date  . COPD (chronic obstructive pulmonary disease) ([place])   . Dementia ([place])   . Diabetes mellitus without complication ([place])   . GERD (gastroesophageal reflux disease)   . Hyperlipidemia   . Hypertension   . Lung cancer ([place])   . Mental retardation   . Schizophrenia ([place])      Significant Hospital Events     Consults:  07/15/2019 pulmonary critical care  Procedures:  07/15/2019 noninvasive mechanical ventilatory support  Significant Diagnostic Tests:  ECHO 07/04/2019 > 1. Left ventricular ejection fraction, by estimation, is 30 to 35%. The  left ventricle has moderately decreased function. The left ventricle  demonstrates global hypokinesis. There is mild left ventricular    hypertrophy. Left ventricular diastolic  parameters are indeterminate.  2. Right ventricular systolic function is mildly reduced. The right  ventricular size is mildly enlarged. There is mildly elevated pulmonary  artery systolic pressure.  3. Left atrial size was mildly dilated.  4. Right atrial size was moderately dilated.  5. The mitral valve is normal in structure. Trivial mitral valve  regurgitation. No evidence of mitral stenosis.  6. Tricuspid valve regurgitation is moderate.  7. The aortic valve is normal in structure. Aortic valve regurgitation is  trivial. Mild aortic valve sclerosis is present, with no evidence of  aortic valve stenosis.  8. Moderately dilated pulmonary artery.   Micro Data:    Antimicrobials:    Interim history/subjective:  Seen lying in bed on BIPAP in no acute distress, states she feels well with no acute complaints   Objective   Blood pressure (!) 152/90, pulse 78, temperature 98.6 F (37 C), temperature source Axillary, resp. rate 15, height 5\' 4"  (1.626 m), weight 92.5 kg, SpO2 100 %.    FiO2 (%):  [40 %] 40 %   Intake/Output Summary (Last 24 hours) at 07/16/2019 1018 Last data filed at 07/16/2019 0643 Gross per 24 hour  Intake 360.57 ml  Output 1300 ml  Net -939.43 ml   Filed Weights   07/15/19 0003 07/16/19 0339  Weight: 94.8 kg 92.5 kg    Examination: General: Chronically ill appearing elderly female on BIPAP, in NAD HEENT: [place]/AT, MM pink/moist, PERRL, sclera non-icteric  Neuro: Alert and oriented, able to follow allo commands, non focal  CV: s1s2 regular rate and rhythm, no  murmur, rubs, or gallops,  PULM:  Slightly diminished but no added breath sound GI: soft, bowel sounds active in all 4 quadrants, non-tender, non-distended Extremities: warm/dry, 1+ pitting lower extremity edema  Skin: no rashes or lesions  Resolved Hospital Problem list     Assessment & Plan:  Acute on chronic hypoxic respiratory failure  -Noted  to be hypercarbic with increased work of breathing secondary to volume overload. -Of note recently admission and discharge from [place] [facility] on 07/14/2019.   P: Able to liberate from BIPAP again this AM  Continue to diurese, volume status much improved Continue bronchodilators  Encourage pulmonary hygiene  Mobilize as needed  Continue supplemental oxygen as needed   Congestive heart failure -last echo 07/04/2019 with EF 30 to 35 % see full read above   Chronic atrial fibrillation  -[facility] did not require ablation or further interventions treated medically. P: Management per primary  Consider cardiology consult  Acute on chronic renal injury P: Follow renal function / urine output Trend Bmet Avoid nephrotoxins, ensure adequate renal perfusion  Close monitoring of renal function during diuresing    PCCM will sign off. Thank you for the opportunity to participate in this patient's care. Please contact if we can be of further assistance.    Best practice:  Diet: NPO Pain/Anxiety/Delirium protocol (if indicated): Schizophrenic medications with stable VAP protocol (if indicated): Not indicated at this time DVT prophylaxis: In place GI prophylaxis: PPI Glucose control: Sliding scale insulin protocol Mobility: Bedrest Code Status: Full Family Communication: No family at bedside patient unable to properly interact Disposition: Most likely will require intensive care admission  Labs   CBC: Recent Labs  Lab 07/10/19 0753 07/10/19 0753 07/13/19 0605 07/15/19 0023 07/15/19 0344 07/15/19 0652 07/15/19 0955  WBC 6.5  --  5.7 9.1  --  8.9  --   NEUTROABS  --   --   --  6.7  --  8.6*  --   HGB 10.2*   < > 11.1* 11.0* 12.9 11.8* 13.6  HCT 31.8*   < > 36.2 37.8 38.0 40.9 40.0  MCV 86.4  --  89.4 92.2  --  93.8  --   PLT 315  --  300 313  --  240  --    < > = values in this interval not displayed.    Basic Metabolic  Panel: Recent Labs  Lab 07/13/19 0605 07/13/19 0605 07/15/19 0023 07/15/19 0023 07/15/19 0344 07/15/19 0831 07/15/19 0950 07/15/19 0955 07/16/19 0429  NA 140   < > 140   < > 142 141 142 142 144  K 4.0   < > 4.4   < > 4.6 4.5 4.3 4.3 4.0  CL 102  --  102  --   --  102 101  --  98  CO2 30  --  28  --   --  24 26  --  33*  GLUCOSE 126*  --  221*  --   --  281* 316*  --  142*  BUN 24*  --  24*  --   --  24* 24*  --  22  CREATININE 1.30*  --  1.58*  --   --  1.56* 1.66*  --  1.37*  CALCIUM 8.7*  --  8.9  --   --  8.8* 8.9  --  9.0   < > = values in this interval not displayed.   GFR: Estimated Creatinine Clearance:  37.9 mL/min (A) (by C-G formula based on SCr of 1.37 mg/dL (H)). Recent Labs  Lab 07/10/19 0753 07/13/19 0605 07/15/19 0023 07/15/19 0652  WBC 6.5 5.7 9.1 8.9    Liver Function Tests: No results for input(s): AST, ALT, ALKPHOS, BILITOT, PROT, ALBUMIN in the last 168 hours. No results for input(s): LIPASE, AMYLASE in the last 168 hours. No results for input(s): AMMONIA in the last 168 hours.  ABG    Component Value Date/Time   PHART 7.394 07/16/2019 0308   PCO2ART 59.9 (H) 07/16/2019 0308   PO2ART 120 (H) 07/16/2019 0308   HCO3 36.0 (H) 07/16/2019 0308   TCO2 35 (H) 07/15/2019 0955   ACIDBASEDEF 1.2 07/03/2019 1100   O2SAT 98.5 07/16/2019 0308     Coagulation Profile: No results for input(s): INR, PROTIME in the last 168 hours.  Cardiac Enzymes: No results for input(s): CKTOTAL, CKMB, CKMBINDEX, TROPONINI in the last 168 hours.  HbA1C: Hemoglobin A1C  Date/Time Value Ref Range Status  07/10/2011 02:34 AM 6.0 4.2 - 6.3 % Final    Comment:    The American Diabetes Association recommends that a primary goal of therapy should be <7% and that physicians should reevaluate the treatment regimen in patients with HbA1c values consistently >8%.    Hgb A1c MFr Bld  Date/Time Value Ref Range Status  07/04/2019 06:59 AM 6.7 (H) 4.8 - 5.6 % Final     Comment:    (NOTE)         Prediabetes: 5.7 - 6.4         Diabetes: >6.4         Glycemic control for adults with diabetes: <7.0   05/09/2019 09:18 PM 6.6 (H) 4.8 - 5.6 % Final    Comment:    (NOTE) Pre diabetes:          5.7%-6.4% Diabetes:              >6.4% Glycemic control for   <7.0% adults with diabetes     CBG: Recent Labs  Lab 07/15/19 1042 07/15/19 1150 07/15/19 1600 07/15/19 2109 07/16/19 0836  GLUCAP 293* 273* 224* 171* 138*     Critical care time:    Johnsie Cancel, NP-C [facility] Contact / Pager information can be found on Amion  07/16/2019, 10:25 AM

## 2019-07-17 LAB — BASIC METABOLIC PANEL
Anion gap: 15 (ref 5–15)
BUN: 23 mg/dL (ref 8–23)
CO2: 31 mmol/L (ref 22–32)
Calcium: 8.9 mg/dL (ref 8.9–10.3)
Chloride: 93 mmol/L — ABNORMAL LOW (ref 98–111)
Creatinine, Ser: 1.52 mg/dL — ABNORMAL HIGH (ref 0.44–1.00)
GFR calc Af Amer: 38 mL/min — ABNORMAL LOW (ref >60)
GFR calc non Af Amer: 33 mL/min — ABNORMAL LOW (ref >60)
Glucose, Bld: 161 mg/dL — ABNORMAL HIGH (ref 70–99)
Potassium: 3.5 mmol/L (ref 3.5–5.1)
Sodium: 139 mmol/L (ref 135–145)

## 2019-07-17 LAB — CBC
HCT: 39.4 % (ref 36.0–46.0)
Hemoglobin: 11.7 g/dL — ABNORMAL LOW (ref 12.0–15.0)
MCH: 27.3 pg (ref 26.0–34.0)
MCHC: 29.7 g/dL — ABNORMAL LOW (ref 30.0–36.0)
MCV: 92.1 fL (ref 80.0–100.0)
Platelets: 220 10*3/uL (ref 150–400)
RBC: 4.28 MIL/uL (ref 3.87–5.11)
RDW: 16.4 % — ABNORMAL HIGH (ref 11.5–15.5)
WBC: 8.8 10*3/uL (ref 4.0–10.5)
nRBC: 0 % (ref 0.0–0.2)

## 2019-07-17 LAB — GLUCOSE, CAPILLARY
Glucose-Capillary: 106 mg/dL — ABNORMAL HIGH (ref 70–99)
Glucose-Capillary: 180 mg/dL — ABNORMAL HIGH (ref 70–99)
Glucose-Capillary: 94 mg/dL (ref 70–99)
Glucose-Capillary: 91 mg/dL (ref 70–99)

## 2019-07-17 MED ORDER — POTASSIUM CHLORIDE CRYS ER 20 MEQ PO TBCR
20.0000 meq | EXTENDED_RELEASE_TABLET | Freq: Once | ORAL | Status: AC
  Administered 2019-07-17: 20 meq via ORAL
  Filled 2019-07-17: qty 1

## 2019-07-17 MED ORDER — METOPROLOL TARTRATE 5 MG/5ML IV SOLN: 5.0000 mg | Freq: Four times a day (QID) | INTRAVENOUS | Status: DC

## 2019-07-17 MED ORDER — ALPRAZOLAM 0.25 MG PO TABS
0.2500 mg | ORAL_TABLET | Freq: Two times a day (BID) | ORAL | Status: DC | PRN
  Administered 2019-07-21 – 2019-07-23 (×2): 0.25 mg via ORAL
  Filled 2019-07-17 (×2): qty 1

## 2019-07-17 MED ORDER — APIXABAN 5 MG PO TABS
5.0000 mg | ORAL_TABLET | Freq: Two times a day (BID) | ORAL | Status: DC
  Administered 2019-07-17 – 2019-07-18 (×2): 5 mg via ORAL
  Filled 2019-07-17 (×3): qty 1

## 2019-07-17 MED ORDER — METOPROLOL TARTRATE 25 MG PO TABS
25.0000 mg | ORAL_TABLET | Freq: Two times a day (BID) | ORAL | Status: DC
  Administered 2019-07-17: 25 mg via ORAL
  Filled 2019-07-17 (×2): qty 1

## 2019-07-17 MED ORDER — QUETIAPINE FUMARATE 100 MG PO TABS
100.0000 mg | ORAL_TABLET | Freq: Every day | ORAL | Status: DC
  Administered 2019-07-18 – 2019-07-30 (×13): 100 mg via ORAL
  Filled 2019-07-17 (×14): qty 1

## 2019-07-17 MED ORDER — DILTIAZEM HCL 60 MG PO TABS
60.0000 mg | ORAL_TABLET | Freq: Four times a day (QID) | ORAL | Status: DC
  Administered 2019-07-17 (×2): 60 mg via ORAL
  Filled 2019-07-17 (×2): qty 1

## 2019-07-17 NOTE — Progress Notes (Addendum)
Progress Note  Patient Name: Ashe Graybeal Date of Encounter: 07/17/2019  [facility] Cardiologist: Kathlyn [place], MD  Subjective   Difficult historian due to dementia, schizophrenia; somnolent on Bipap; however continues to deny CP or dyspnea  Inpatient Medications    Scheduled Meds: . ALPRAZolam  0.25 mg Oral BID  . atorvastatin  20 mg Oral QHS  . budesonide (PULMICORT) nebulizer solution  0.25 mg Nebulization BID  . memantine  28 mg Oral Daily   And  . donepezil  10 mg Oral QHS  . enoxaparin (LOVENOX) injection  90 mg Subcutaneous Q12H  . ferrous sulfate  325 mg Oral BID WC  . furosemide  40 mg Intravenous Q12H  . insulin aspart  0-9 Units Subcutaneous TID WC  . insulin glargine  10 Units Subcutaneous Daily  . ipratropium  0.5 mg Nebulization TID  . levalbuterol  0.63 mg Nebulization TID  . metoprolol tartrate  2.5 mg Intravenous Q6H  . modafinil  100 mg Oral Daily  . pantoprazole  40 mg Oral Daily  . perphenazine  4 mg Oral TID  . QUEtiapine  200 mg Oral QHS   Continuous Infusions: . diltiazem (CARDIZEM) infusion 12.5 mg/hr (07/16/19 2252)   PRN Meds: levalbuterol, ondansetron **OR** ondansetron (ZOFRAN) IV   Vital Signs    Vitals:   07/17/19 0337 07/17/19 0500 07/17/19 0708 07/17/19 0733  BP: (!) 146/73  130/61 (!) 124/55  Pulse: 79  79 66  Resp: 18  13 16   Temp: 98.1 F (36.7 C)   98.2 F (36.8 C)  TempSrc: Axillary   Oral  SpO2: 100%  100% 100%  Weight:  92 kg    Height:        Intake/Output Summary (Last 24 hours) at 07/17/2019 0837 Last data filed at 07/16/2019 1500 Gross per 24 hour  Intake 194.6 ml  Output 600 ml  Net -405.4 ml   Last 3 Weights 07/17/2019 07/16/2019 07/15/2019  Weight (lbs) 202 lb 13.2 oz 203 lb 14.8 oz 209 lb  Weight (kg) 92 kg 92.5 kg 94.802 kg      Telemetry    Atrial flutter rate controlled- Personally Reviewed  Physical Exam   GEN: NAD Neck: supple Cardiac: irregular, normal rate Respiratory: CTA anteriorly GI:  Soft, NT/ND MS: No edema Neuro:  Somnolent; Moves all ext Psych: Repetitive   Labs    High Sensitivity Troponin:   Recent Labs  Lab 07/03/19 1526 07/03/19 1918 07/15/19 0023 07/15/19 0221 07/15/19 0652  TROPONINIHS 42* 43* 41* 53* 56*      Chemistry Recent Labs  Lab 07/15/19 0831 07/15/19 0831 07/15/19 0950 07/15/19 0955 07/16/19 0429  NA 141   < > 142 142 144  K 4.5   < > 4.3 4.3 4.0  CL 102  --  101  --  98  CO2 24  --  26  --  33*  GLUCOSE 281*  --  316*  --  142*  BUN 24*  --  24*  --  22  CREATININE 1.56*  --  1.66*  --  1.37*  CALCIUM 8.8*  --  8.9  --  9.0  GFRNONAA 32*  --  29*  --  37*  GFRAA 37*  --  34*  --  43*  ANIONGAP 15  --  15  --  13   < > = values in this interval not displayed.     Hematology Recent Labs  Lab 07/13/19 817 [id number] 07/13/19 9811 07/15/19 0023 07/15/19  0023 07/15/19 0344 07/15/19 0652 07/15/19 0955  WBC 5.7  --  9.1  --   --  8.9  --   RBC 4.05  --  4.10  --   --  4.36  --   HGB 11.1*   < > 11.0*   < > 12.9 11.8* 13.6  HCT 36.2   < > 37.8   < > 38.0 40.9 40.0  MCV 89.4  --  92.2  --   --  93.8  --   MCH 27.4  --  26.8  --   --  27.1  --   MCHC 30.7  --  29.1*  --   --  28.9*  --   RDW 17.1*  --  16.7*  --   --  16.6*  --   PLT 300  --  313  --   --  240  --    < > = values in this interval not displayed.    BNP Recent Labs  Lab 07/15/19 0023  BNP 1,618.7*    Patient Profile     79 year old female with past medical history of diabetes, hypertension, hyperlipidemia, COPD, lung cancer, dementia, schizophrenia, atrial flutter for evaluation of atrial flutter with rapid ventricular response and CHF.  Patient recently discharged from [facility] following admission for COPD.  She did have atrial flutter at that time.  She received amiodarone and Toprol but apparently had a 5-second posttermination pause.  She was seen by Dr. Caryl Comes and rate control was recommended given her overall medical condition.  She was discharged  on Toprol 50 mg daily and Xarelto.  Apparently she developed respiratory distress and was brought to [facility].  She was treated with diuretics with some improvement.  She is noted to be in atrial flutter and cardiology asked to evaluate.  Recent echocardiogram showed ejection fraction 30 to 35%, mild left ventricular hypertrophy, mild right ventricular enlargement, biatrial enlargement, moderate tricuspid regurgitation.   Assessment & Plan    1 atrial flutter-patient's heart rate is controlled.  Pt now apparently taking oral meds; DC IV cardizem and IV metoprolol; treat with cardizem 60 mg q 6 and metoprolol 25 BID; continue to decrease cardizem and increase metoprolol for rate control given cardiomyopathy;  ultimately would like to treat with beta blocker alone if possible. Follow telemetry as she did have post termination pause at [place]. Would transition lovenox to apixaban 5 mg BID.   2 acute on chronic systolic congestive heart failure-continue Lasix at present dose.  Transition to lasix 40 mg po BID in next 24-48 hours.  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  Once metoprolol dose stable, change to toprol. Add losartan as outpt if BP allows. Given overall medical condition I do not think she would be a good candidate for ischemia evaluation.  4 acute on chronic stage III kidney disease-follow renal function closely.  5 dementia/schizophrenia/mental retardation-as outlined previously it would be worthwhile to continue efforts to contact patient's family for discussions concerning aggressiveness of care.  For questions or updates, please contact [place] Please consult [email or web address] for contact info under        Signed, Kirk Ruths, MD  07/17/2019, 8:37 AM

## 2019-07-17 NOTE — TOC Initial Note (Signed)
Transition of Care [facility]) - Initial/Assessment Note    Patient Details  Name: Cheryl [name] MRN: [id number] Date of Birth: 1940-07-30  [facility]) CM/SW Contact:    Jacquelynn Cree Phone Number: 07/17/2019, 10:54 AM  Clinical Narrative:                  Patient currently only oriented to self, which is her baseline. CSW attempted contact to patient's son Yudit Modesitt. CSW left a voicemail, awaiting a call back.   CSW reviewed previous notes and determined patient has been at [facility] since 6/30 for rehab and permanently resides at [place] [phone]. CSW attempted to make contact with [facility] and was unable to reach anyone outside the business hours. CSW spoke with Elmo Putt of [facility] and confirmed patient has been there since 6/30. Elmo Putt shared she has had minimum contact with patient's son and when she last spoke with him he deferred all questions to [facility]. CSW informed Isaias Cowman we will keep them update on patient's potential discharge.  TOC team will continue to follow and assist with discharge planning needs.   Expected Discharge Plan: Skilled Nursing Facility Barriers to Discharge: Continued Medical Work up   Patient Goals and CMS Choice   CMS Medicare.gov Compare Post Acute Care list provided to::  [facility])    Expected Discharge Plan and Services Expected Discharge Plan: [place] arrangements for the past 2 months: [place]                                      Prior Living Arrangements/Services Living arrangements for the past 2 months: [place] Lives with:: Facility Resident                   Activities of Daily Living      Permission Sought/Granted                  Emotional Assessment              Admission diagnosis:  COPD exacerbation ([place]) [J44.1] Acute respiratory failure with hypoxia ([place]) [J96.01] Patient  Active Problem List   Diagnosis Date Noted  . Acute on chronic systolic CHF (congestive heart failure) ([place]) 07/15/2019  . Atrial flutter ([place])   . Sinus node dysfunction (H[facility])   . Persistent atrial fibrillation ([place])   . Systolic heart failure ([place])   . Dementia ([place])   . Type II diabetes mellitus with renal manifestations ([place])   . Elevated troponin   . Acute on chronic respiratory failure with hypoxia ([place])   . Acute renal failure superimposed on stage 3a chronic kidney disease ([place])   . Acute respiratory failure with hypoxia ([place])   . Shortness of breath   . COPD with acute exacerbation ([place]) 05/09/2019  . Respiratory failure with hypoxia and hypercapnia ([place]) 05/09/2019  . Hyperglycemia due to diabetes mellitus ([place]) 05/09/2019  . GERD (gastroesophageal reflux disease) 05/09/2019  . HTN (hypertension) 05/09/2019  . HLD (hyperlipidemia) 05/09/2019  . Moderate dementia, with behavioral disturbance ([place]) 10/06/2016  . Incontinence of feces 05/30/2016  . COPD exacerbation ([place]) 01/08/2016  . Hypokalemia 02/07/2015  . Cancer of left lung ([place]) 07/25/2011   PCP:  Letta Median, MD Pharmacy:   [facility] [address]  [place] [address] Phone: [phone] Fax: [phone]     Social Determinants of Health (SDOH) Interventions    Readmission Risk Interventions Readmission Risk Prevention Plan 05/10/2019  Transportation Screening Complete  PCP or Specialist Appt within 3-5 Days Complete  HRI or Home Care Consult Complete  Medication Review (RN Care Manager) Complete  Some recent data might be hidden

## 2019-07-17 NOTE — Progress Notes (Signed)
Patient placed back on bipap per Dr. Tyrell Antonio, patient isto have bipap on while sleeping.

## 2019-07-17 NOTE — Progress Notes (Signed)
PROGRESS NOTE    Cheryl [name]  UEA:[id number] DOB: [birth year] DOA: 07/14/2019 PCP: Letta Median, MD   Brief Narrative: 79 year old COPD, Systolic Heart EF 47-82 %, schizophrenia, CKD stage B , recently discharge from [place] treated for A flutter, COPD HF exacerbation, discharge day prior to admission. Presents with worsening SOB, Respiratory failure.     Assessment & Plan:   Principal Problem:   Acute respiratory failure with hypoxia (HCC) Active Problems:   COPD exacerbation (HCC)   HTN (hypertension)   Dementia (HCC)   Persistent atrial fibrillation (HCC)   Acute on chronic systolic CHF (congestive heart failure) (HCC)  1-Acute Hypoxic Respiratory Failure, hypercapnic.  Secondary to Heart failure exacerbation,  COPD exacerbation.  Continue with nebulizer treatment.  Patient place on BIPAP.  CCM consulted.  On IV lasix 40 mg IV BID> plan to transition to oral in next 24--48 hours..  Strict I and o.  Discontinue solumedrol per CCM recommendations.  Continue with BIPAP at HS and PRN Oxygen sat 95 on 3 L.  BIPAP PRN.   2-A flutter  RVR;  Treated initially with IV metoprolol and Cardizem to oral Cardizem and metoprolol.  She has a 5 second postermination pause while on amiodarone and toprol.  She was transition from Lovenox to Eliquis.  TSH normal.  HR better , appreciate cardiology assistance.   Diabetes Type 2;  SSI.   Chronic anemia;  Follow hb.  Hb stable at 11.   Acute on Chronic renal failure stage III; b, cr baseline 1.3  Monitor on lasix.   Hyperlipidemia; continue with statins.  History of schizophrenia; continue with xanax and seraquel. Decrease Seroquel. Change xanax to PRN  History of dementia; Namzaric.  History of lung cancer S./p radiation.   Estimated body mass index is 34.81 kg/m as calculated from the following:   Height as of this encounter: 5\' 4"  (1.626 m).   Weight as of this encounter: 92 kg.   DVT prophylaxis:  Lovenox Code Status: Full code Family Communication: Son updated over phone7/02 Disposition Plan:  Status is: Inpatient  Remains inpatient appropriate because:Hemodynamically unstable   Dispo: The patient is from: Home              Anticipated d/c is to: SNF              Anticipated d/c date is: 3 days              Patient currently is not medically stable to d/c.        Consultants:   CCM  Cardiology   Procedures:   None  Antimicrobials:    Subjective: Sleepy, but will wake up open eyes. Answer questions.   Objective: Vitals:   07/17/19 0939 07/17/19 0945 07/17/19 1100 07/17/19 1133  BP:  124/69  124/77  Pulse:    89  Resp: 18 18 18 20   Temp:    97.9 F (36.6 C)  TempSrc:    Oral  SpO2:  95% 94% 94%  Weight:      Height:        Intake/Output Summary (Last 24 hours) at 07/17/2019 1258 Last data filed at 07/16/2019 1500 Gross per 24 hour  Intake 14.6 ml  Output 600 ml  Net -585.4 ml   Filed Weights   07/15/19 0003 07/16/19 0339 07/17/19 0500  Weight: 94.8 kg 92.5 kg 92 kg    Examination:  General exam: NAD Respiratory system: BL crackles.  Gastrointestinal system: BS present, soft, nt  Central nervous system: Alert Extremities: Symmetry power.  Skin; no rashes    Data Reviewed: I have personally reviewed following labs and imaging studies  CBC: Recent Labs  Lab 07/13/19 0605 07/13/19 0605 07/15/19 0023 07/15/19 0344 07/15/19 0652 07/15/19 0955 07/17/19 1011  WBC 5.7  --  9.1  --  8.9  --  8.8  NEUTROABS  --   --  6.7  --  8.6*  --   --   HGB 11.1*   < > 11.0* 12.9 11.8* 13.6 11.7*  HCT 36.2   < > 37.8 38.0 40.9 40.0 39.4  MCV 89.4  --  92.2  --  93.8  --  92.1  PLT 300  --  313  --  240  --  220   < > = values in this interval not displayed.   Basic Metabolic Panel: Recent Labs  Lab 07/15/19 0023 07/15/19 0344 07/15/19 0831 07/15/19 0950 07/15/19 0955 07/16/19 0429 07/17/19 1011  NA 140   < > 141 142 142 144 139  K 4.4    < > 4.5 4.3 4.3 4.0 3.5  CL 102  --  102 101  --  98 93*  CO2 28  --  24 26  --  33* 31  GLUCOSE 221*  --  281* 316*  --  142* 161*  BUN 24*  --  24* 24*  --  22 23  CREATININE 1.58*  --  1.56* 1.66*  --  1.37* 1.52*  CALCIUM 8.9  --  8.8* 8.9  --  9.0 8.9   < > = values in this interval not displayed.   GFR: Estimated Creatinine Clearance: 34.1 mL/min (A) (by C-G formula based on SCr of 1.52 mg/dL (H)). Liver Function Tests: No results for input(s): AST, ALT, ALKPHOS, BILITOT, PROT, ALBUMIN in the last 168 hours. No results for input(s): LIPASE, AMYLASE in the last 168 hours. No results for input(s): AMMONIA in the last 168 hours. Coagulation Profile: No results for input(s): INR, PROTIME in the last 168 hours. Cardiac Enzymes: No results for input(s): CKTOTAL, CKMB, CKMBINDEX, TROPONINI in the last 168 hours. BNP (last 3 results) No results for input(s): PROBNP in the last 8760 hours. HbA1C: No results for input(s): HGBA1C in the last 72 hours. CBG: Recent Labs  Lab 07/16/19 1158 07/16/19 1629 07/16/19 2100 07/17/19 0723 07/17/19 1142  GLUCAP 126* 204* 186* 106* 180*   Lipid Profile: No results for input(s): CHOL, HDL, LDLCALC, TRIG, CHOLHDL, LDLDIRECT in the last 72 hours. Thyroid Function Tests: Recent Labs    07/15/19 0652  TSH 1.100   Anemia Panel: No results for input(s): VITAMINB12, FOLATE, FERRITIN, TIBC, IRON, RETICCTPCT in the last 72 hours. Sepsis Labs: No results for input(s): PROCALCITON, LATICACIDVEN in the last 168 hours.  Recent Results (from the past 240 hour(s))  MRSA PCR Screening     Status: None   Collection Time: 07/08/19  4:00 PM   Specimen: Nasal Mucosa; Nasopharyngeal  Result Value Ref Range Status   MRSA by PCR NEGATIVE NEGATIVE Final    Comment:        The GeneXpert MRSA Assay (FDA approved for NASAL specimens only), is one component of a comprehensive MRSA colonization surveillance program. It is not intended to diagnose  MRSA infection nor to guide or monitor treatment for MRSA infections. Performed at [facility], [place], [place] [address]   SARS CORONAVIRUS 2 (TAT 6-24 HRS) Nasopharyngeal Nasopharyngeal Swab  Status: None   Collection Time: 07/14/19 12:00 PM   Specimen: Nasopharyngeal Swab  Result Value Ref Range Status   SARS Coronavirus 2 NEGATIVE NEGATIVE Final    Comment: (NOTE) SARS-CoV-2 target nucleic acids are NOT DETECTED.  The SARS-CoV-2 RNA is generally detectable in upper and lower respiratory specimens during the acute phase of infection. Negative results do not preclude SARS-CoV-2 infection, do not rule out co-infections with other pathogens, and should not be used as the sole basis for treatment or other patient management decisions. Negative results must be combined with clinical observations, patient history, and epidemiological information. The expected result is Negative.  Fact Sheet for Patients: SugarRoll.be  Fact Sheet for Healthcare Providers: https://www.woods-mathews.com/  This test is not yet approved or cleared by the [place] FDA and  has been authorized for detection and/or diagnosis of SARS-CoV-2 by FDA under an Emergency Use Authorization (EUA). This EUA will remain  in effect (meaning this test can be used) for the duration of the COVID-19 declaration under Se ction 564(b)(1) of the Act, 21 U.S.C. section 360bbb-3(b)(1), unless the authorization is terminated or revoked sooner.  Performed at [facility], [place] [address], [place], [place] [address]   SARS Coronavirus 2 by RT PCR (hospital order, performed in [facility] hospital lab) Nasopharyngeal Nasopharyngeal Swab     Status: None   Collection Time: 07/15/19 12:28 AM   Specimen: Nasopharyngeal Swab  Result Value Ref Range Status   SARS Coronavirus 2 NEGATIVE NEGATIVE Final    Comment: (NOTE) SARS-CoV-2 target nucleic  acids are NOT DETECTED.  The SARS-CoV-2 RNA is generally detectable in upper and lower respiratory specimens during the acute phase of infection. The lowest concentration of SARS-CoV-2 viral copies this assay can detect is 250 copies / mL. A negative result does not preclude SARS-CoV-2 infection and should not be used as the sole basis for treatment or other patient management decisions.  A negative result may occur with improper specimen collection / handling, submission of specimen other than nasopharyngeal swab, presence of viral mutation(s) within the areas targeted by this assay, and inadequate number of viral copies (<250 copies / mL). A negative result must be combined with clinical observations, patient history, and epidemiological information.  Fact Sheet for Patients:   StrictlyIdeas.no  Fact Sheet for Healthcare Providers: BankingDealers.co.za  This test is not yet approved or  cleared by the [place] FDA and has been authorized for detection and/or diagnosis of SARS-CoV-2 by FDA under an Emergency Use Authorization (EUA).  This EUA will remain in effect (meaning this test can be used) for the duration of the COVID-19 declaration under Section 564(b)(1) of the Act, 21 U.S.C. section 360bbb-3(b)(1), unless the authorization is terminated or revoked sooner.  Performed at [facility], [place] [address], [place],  [address]          Radiology Studies: No results found.      Scheduled Meds: . apixaban  5 mg Oral BID  . atorvastatin  20 mg Oral QHS  . budesonide (PULMICORT) nebulizer solution  0.25 mg Nebulization BID  . diltiazem  60 mg Oral Q6H  . memantine  28 mg Oral Daily   And  . donepezil  10 mg Oral QHS  . ferrous sulfate  325 mg Oral BID WC  . furosemide  40 mg Intravenous Q12H  . insulin aspart  0-9 Units Subcutaneous TID WC  . insulin glargine  10 Units Subcutaneous Daily  .  ipratropium  0.5 mg Nebulization TID  . levalbuterol  0.63 mg Nebulization TID  . metoprolol tartrate  25 mg Oral BID  . modafinil  100 mg Oral Daily  . pantoprazole  40 mg Oral Daily  . perphenazine  4 mg Oral TID  . QUEtiapine  100 mg Oral QHS   Continuous Infusions:    LOS: 2 days    Time spent: 35 minutes    Silver Parkey A Moselle Rister, MD [facility]   If 7PM-7AM, please contact night-coverage [email or web address]  07/17/2019, 12:58 PM

## 2019-07-17 NOTE — Progress Notes (Signed)
Pharmacist Heart Failure Core Measure Documentation  Assessment: Cheryl [name] has an EF documented as 30-35% on 07/04/19 by ECHO.  Rationale: Heart failure patients with left ventricular systolic dysfunction (LVSD) and an EF < 40% should be prescribed an angiotensin converting enzyme inhibitor (ACEI) or angiotensin receptor blocker (ARB) at discharge unless a contraindication is documented in the medical record.  This patient is not currently on an ACEI or ARB for HF.  This note is being placed in the record in order to provide documentation that a contraindication to the use of these agents is present for this encounter.  ACE Inhibitor or Angiotensin Receptor Blocker is contraindicated (specify all that apply)  []   ACEI allergy AND ARB allergy []   Angioedema []   Moderate or severe aortic stenosis []   Hyperkalemia []   Hypotension []   Renal artery stenosis [x]   Worsening renal function, preexisting renal disease or dysfunction   Rhiannan Kievit 07/17/2019 2:54 PM

## 2019-07-17 NOTE — Progress Notes (Signed)
  Speech Language Pathology Treatment: Dysphagia  Patient Details Name: Cheryl [name] MRN: [id number] DOB: [birth year] Today's Date: 07/17/2019 Time: 7711-6579 SLP Time Calculation (min) (ACUTE ONLY): 22 min  Assessment / Plan / Recommendation Clinical Impression  Pt awakened from sleep by RN andSLP, eager to eat and drink. Tolerated meal without difficulty, assist given for feeding, but self feeding by end of meal. No signs of aspiration. Mastication WNL. Will upgrade ot regular and sign off.   HPI HPI: Patient is a 79 y.o. with PMH: COPD, systolic Heart EF 03-83%, schizophrenia, CKD stage B, recent discharge from [facility] with atrial flutter, COPD HF exacerbation. She was admitted to [facility] with worsening SOB, respiratory failure. She was placed on BiPap continously and respiratory therapy was able to switch her to 5L HFNC at 1056 on 7/3.      SLP Plan  All goals met       Recommendations  Diet recommendations: Regular;Thin liquid Liquids provided via: Cup;Straw Medication Administration: Whole meds with liquid Supervision: Staff to assist with self feeding Compensations: Slow rate;Small sips/bites Postural Changes and/or Swallow Maneuvers: Seated upright 90 degrees                Oral Care Recommendations: Oral care BID Follow up Recommendations: None SLP Visit Diagnosis: Dysphagia, unspecified (R13.10) Plan: All goals met       GO              Herbie Baltimore, MA CCC-SLP  Acute Rehabilitation Services Pager [phone] Office [phone]   Lynann Beaver 07/17/2019, 9:45 AM

## 2019-07-17 NOTE — Progress Notes (Signed)
NP Blount was paged to give update on pt status to include low blood pressure outside of ordered parameters. Awaiting return call.

## 2019-07-18 LAB — CBC
HCT: 38.7 % (ref 36.0–46.0)
Hemoglobin: 11.4 g/dL — ABNORMAL LOW (ref 12.0–15.0)
MCH: 27 pg (ref 26.0–34.0)
MCHC: 29.5 g/dL — ABNORMAL LOW (ref 30.0–36.0)
MCV: 91.5 fL (ref 80.0–100.0)
Platelets: 238 10*3/uL (ref 150–400)
RBC: 4.23 MIL/uL (ref 3.87–5.11)
RDW: 15.9 % — ABNORMAL HIGH (ref 11.5–15.5)
WBC: 6.2 10*3/uL (ref 4.0–10.5)
nRBC: 0 % (ref 0.0–0.2)

## 2019-07-18 LAB — BASIC METABOLIC PANEL
Anion gap: 11 (ref 5–15)
BUN: 19 mg/dL (ref 8–23)
CO2: 36 mmol/L — ABNORMAL HIGH (ref 22–32)
Calcium: 8.9 mg/dL (ref 8.9–10.3)
Chloride: 91 mmol/L — ABNORMAL LOW (ref 98–111)
Creatinine, Ser: 1.51 mg/dL — ABNORMAL HIGH (ref 0.44–1.00)
GFR calc Af Amer: 38 mL/min — ABNORMAL LOW (ref >60)
GFR calc non Af Amer: 33 mL/min — ABNORMAL LOW (ref >60)
Glucose, Bld: 164 mg/dL — ABNORMAL HIGH (ref 70–99)
Potassium: 3.3 mmol/L — ABNORMAL LOW (ref 3.5–5.1)
Sodium: 138 mmol/L (ref 135–145)

## 2019-07-18 LAB — GLUCOSE, CAPILLARY
Glucose-Capillary: 78 mg/dL (ref 70–99)
Glucose-Capillary: 92 mg/dL (ref 70–99)
Glucose-Capillary: 175 mg/dL — ABNORMAL HIGH (ref 70–99)
Glucose-Capillary: 146 mg/dL — ABNORMAL HIGH (ref 70–99)
Glucose-Capillary: 85 mg/dL (ref 70–99)

## 2019-07-18 LAB — TROPONIN I (HIGH SENSITIVITY): Troponin I (High Sensitivity): 38 ng/L — ABNORMAL HIGH (ref <18)

## 2019-07-18 MED ORDER — ASPIRIN EC 81 MG PO TBEC
81.0000 mg | DELAYED_RELEASE_TABLET | Freq: Every day | ORAL | Status: DC
  Administered 2019-07-19 – 2019-07-31 (×13): 81 mg via ORAL
  Filled 2019-07-18 (×13): qty 1

## 2019-07-18 MED ORDER — DILTIAZEM HCL 30 MG PO TABS
30.0000 mg | ORAL_TABLET | Freq: Four times a day (QID) | ORAL | Status: DC
  Administered 2019-07-18 – 2019-07-27 (×36): 30 mg via ORAL
  Filled 2019-07-18 (×36): qty 1

## 2019-07-18 MED ORDER — POTASSIUM CHLORIDE CRYS ER 20 MEQ PO TBCR
40.0000 meq | EXTENDED_RELEASE_TABLET | Freq: Once | ORAL | Status: AC
  Administered 2019-07-18: 40 meq via ORAL
  Filled 2019-07-18: qty 2

## 2019-07-18 MED ORDER — SODIUM CHLORIDE 0.9% FLUSH
10.0000 mL | Freq: Two times a day (BID) | INTRAVENOUS | Status: DC
  Administered 2019-07-18 – 2019-07-19 (×3): 10 mL
  Administered 2019-07-19: 20 mL
  Administered 2019-07-20 – 2019-07-25 (×11): 10 mL
  Administered 2019-07-26: 20 mL
  Administered 2019-07-26 – 2019-07-31 (×11): 10 mL

## 2019-07-18 MED ORDER — HEPARIN (PORCINE) 25000 UT/250ML-% IV SOLN
900.0000 [IU]/h | INTRAVENOUS | Status: AC
  Administered 2019-07-18: 900 [IU]/h via INTRAVENOUS
  Filled 2019-07-18: qty 250

## 2019-07-18 MED ORDER — METOPROLOL TARTRATE 50 MG PO TABS
50.0000 mg | ORAL_TABLET | Freq: Two times a day (BID) | ORAL | Status: DC
  Administered 2019-07-18 – 2019-07-21 (×7): 50 mg via ORAL
  Filled 2019-07-18 (×7): qty 1

## 2019-07-18 MED ORDER — NITROGLYCERIN 0.4 MG SL SUBL: 0.4000 mg | SUBLINGUAL_TABLET | SUBLINGUAL | Status: DC | PRN

## 2019-07-18 MED ORDER — SODIUM CHLORIDE 0.9% FLUSH: 10.0000 mL | INTRAVENOUS | Status: DC | PRN

## 2019-07-18 MED ORDER — FUROSEMIDE 40 MG PO TABS
40.0000 mg | ORAL_TABLET | Freq: Two times a day (BID) | ORAL | Status: DC
  Administered 2019-07-18 – 2019-07-19 (×4): 40 mg via ORAL
  Filled 2019-07-18 (×4): qty 1

## 2019-07-18 NOTE — Progress Notes (Signed)
PROGRESS NOTE    Cheryl [name]  TML:[id number] DOB: 11-22-1940 DOA: 07/14/2019 PCP: Letta Median, MD   Brief Narrative: 79 year old COPD, Systolic Heart EF 68-12 %, schizophrenia, CKD stage B , recently discharge from [place] treated for A flutter, COPD HF exacerbation, discharge day prior to admission. Presents with worsening SOB, Respiratory failure.     Assessment & Plan:   Principal Problem:   Acute respiratory failure with hypoxia (HCC) Active Problems:   COPD exacerbation (HCC)   HTN (hypertension)   Dementia (HCC)   Persistent atrial fibrillation (HCC)   Acute on chronic systolic CHF (congestive heart failure) (HCC)  1-Acute Hypoxic Respiratory Failure, hypercapnic.  Secondary to Heart failure exacerbation,  COPD exacerbation.  Continue with nebulizer treatment.  Patient place on BIPAP. CCM consulted and subsequent sign off.  On IV lasix 40 mg IV BID> plan to transition to oral today.  Discontinue solumedrol per CCM recommendations.  Continue with BIPAP at HS and PRN Oxygen sat 95 on 2 L.  BIPAP PRN.  She does not appears in any distress.   2-A flutter  RVR;  Treated initially with IV metoprolol and Cardizem to oral Cardizem and metoprolol.  She has a 5 second postermination pause while on amiodarone and toprol.  She was transition from Lovenox to Eliquis.  TSH normal.  HR upp this am, she just received her morning medications.   Chest pain, epigastric pain; EKG changes. ST depression anterior lead.  Cycle troponin.  Nitroglycerin given.  Cardiology inform of event.  Plan to change eliquis to heparin.   Diabetes Type 2;  SSI.   Chronic anemia;  Follow hb.  Hb stable at 11.   Acute on Chronic renal failure stage III; b, cr baseline 1.3  Monitor on lasix.   Hyperlipidemia; continue with statins.  History of schizophrenia; continue with xanax and seraquel. Decrease Seroquel. Change xanax to PRN  History of dementia; Namzaric.  She is more  alert. Oriented to person only. I called  [facility]( [phone] ) to find out patient baseline. no answers.   History of lung cancer S./p radiation.   Estimated body mass index is 34.21 kg/m as calculated from the following:   Height as of this encounter: 5\' 4"  (1.626 m).   Weight as of this encounter: 90.4 kg.   DVT prophylaxis: Lovenox Code Status: Full code Family Communication: Son updated over phone7/04 Disposition Plan:  Status is: Inpatient  Remains inpatient appropriate because:Hemodynamically unstable   Dispo: The patient is from: Home              Anticipated d/c is to: SNF              Anticipated d/c date is: 3 days              Patient currently is not medically stable to d/c.        Consultants:   CCM  Cardiology   Procedures:   None  Antimicrobials:    Subjective: She is more alert this am.  She appears to be breathing better. Denies dyspnea.  She does report chest pain/epigastric pain.   Objective: Vitals:   07/18/19 0401 07/18/19 0501 07/18/19 0723 07/18/19 0753  BP: 138/84 129/84 130/85 130/84  Pulse: 72 82 77 84  Resp: 15 17 13 12   Temp: 97.7 F (36.5 C)   97.9 F (36.6 C)  TempSrc: Axillary   Oral  SpO2: 99% 100% 100% 96%  Weight: 90.4 kg  Height:        Intake/Output Summary (Last 24 hours) at 07/18/2019 1009 Last data filed at 07/18/2019 0600 Gross per 24 hour  Intake --  Output 1500 ml  Net -1500 ml   Filed Weights   07/16/19 0339 07/17/19 0500 07/18/19 0401  Weight: 92.5 kg 92 kg 90.4 kg    Examination:  General exam: NAD CVS; S 1, S2 RRR Respiratory system: CTA, no tachypnea Gastrointestinal system: BS present, soft nt Central nervous system: more alert  Extremities: No rashes Skin; No rashes.     Data Reviewed: I have personally reviewed following labs and imaging studies  CBC: Recent Labs  Lab 07/13/19 0605 07/13/19 0605 07/15/19 0023 07/15/19 0344 07/15/19 0652 07/15/19 0955  07/17/19 1011  WBC 5.7  --  9.1  --  8.9  --  8.8  NEUTROABS  --   --  6.7  --  8.6*  --   --   HGB 11.1*   < > 11.0* 12.9 11.8* 13.6 11.7*  HCT 36.2   < > 37.8 38.0 40.9 40.0 39.4  MCV 89.4  --  92.2  --  93.8  --  92.1  PLT 300  --  313  --  240  --  220   < > = values in this interval not displayed.   Basic Metabolic Panel: Recent Labs  Lab 07/15/19 0023 07/15/19 0344 07/15/19 0831 07/15/19 0950 07/15/19 0955 07/16/19 0429 07/17/19 1011  NA 140   < > 141 142 142 144 139  K 4.4   < > 4.5 4.3 4.3 4.0 3.5  CL 102  --  102 101  --  98 93*  CO2 28  --  24 26  --  33* 31  GLUCOSE 221*  --  281* 316*  --  142* 161*  BUN 24*  --  24* 24*  --  22 23  CREATININE 1.58*  --  1.56* 1.66*  --  1.37* 1.52*  CALCIUM 8.9  --  8.8* 8.9  --  9.0 8.9   < > = values in this interval not displayed.   GFR: Estimated Creatinine Clearance: 33.8 mL/min (A) (by C-G formula based on SCr of 1.52 mg/dL (H)). Liver Function Tests: No results for input(s): AST, ALT, ALKPHOS, BILITOT, PROT, ALBUMIN in the last 168 hours. No results for input(s): LIPASE, AMYLASE in the last 168 hours. No results for input(s): AMMONIA in the last 168 hours. Coagulation Profile: No results for input(s): INR, PROTIME in the last 168 hours. Cardiac Enzymes: No results for input(s): CKTOTAL, CKMB, CKMBINDEX, TROPONINI in the last 168 hours. BNP (last 3 results) No results for input(s): PROBNP in the last 8760 hours. HbA1C: No results for input(s): HGBA1C in the last 72 hours. CBG: Recent Labs  Lab 07/17/19 1142 07/17/19 1610 07/17/19 2106 07/18/19 0520 07/18/19 0828  GLUCAP 180* 94 91 78 92   Lipid Profile: No results for input(s): CHOL, HDL, LDLCALC, TRIG, CHOLHDL, LDLDIRECT in the last 72 hours. Thyroid Function Tests: No results for input(s): TSH, T4TOTAL, FREET4, T3FREE, THYROIDAB in the last 72 hours. Anemia Panel: No results for input(s): VITAMINB12, FOLATE, FERRITIN, TIBC, IRON, RETICCTPCT in the last  72 hours. Sepsis Labs: No results for input(s): PROCALCITON, LATICACIDVEN in the last 168 hours.  Recent Results (from the past 240 hour(s))  MRSA PCR Screening     Status: None   Collection Time: 07/08/19  4:00 PM   Specimen: Nasal Mucosa; Nasopharyngeal  Result Value Ref  Range Status   MRSA by PCR NEGATIVE NEGATIVE Final    Comment:        The GeneXpert MRSA Assay (FDA approved for NASAL specimens only), is one component of a comprehensive MRSA colonization surveillance program. It is not intended to diagnose MRSA infection nor to guide or monitor treatment for MRSA infections. Performed at [facility], [place], [place] [address]   SARS CORONAVIRUS 2 (TAT 6-24 HRS) Nasopharyngeal Nasopharyngeal Swab     Status: None   Collection Time: 07/14/19 12:00 PM   Specimen: Nasopharyngeal Swab  Result Value Ref Range Status   SARS Coronavirus 2 NEGATIVE NEGATIVE Final    Comment: (NOTE) SARS-CoV-2 target nucleic acids are NOT DETECTED.  The SARS-CoV-2 RNA is generally detectable in upper and lower respiratory specimens during the acute phase of infection. Negative results do not preclude SARS-CoV-2 infection, do not rule out co-infections with other pathogens, and should not be used as the sole basis for treatment or other patient management decisions. Negative results must be combined with clinical observations, patient history, and epidemiological information. The expected result is Negative.  Fact Sheet for Patients: SugarRoll.be  Fact Sheet for Healthcare Providers: https://www.woods-mathews.com/  This test is not yet approved or cleared by the [place] FDA and  has been authorized for detection and/or diagnosis of SARS-CoV-2 by FDA under an Emergency Use Authorization (EUA). This EUA will remain  in effect (meaning this test can be used) for the duration of the COVID-19 declaration under Se ction  564(b)(1) of the Act, 21 U.S.C. section 360bbb-3(b)(1), unless the authorization is terminated or revoked sooner.  Performed at [facility], [place] [address] [place], [place] [address]   SARS Coronavirus 2 by RT PCR (hospital order, performed in [facility] hospital lab) Nasopharyngeal Nasopharyngeal Swab     Status: None   Collection Time: 07/15/19 12:28 AM   Specimen: Nasopharyngeal Swab  Result Value Ref Range Status   SARS Coronavirus 2 NEGATIVE NEGATIVE Final    Comment: (NOTE) SARS-CoV-2 target nucleic acids are NOT DETECTED.  The SARS-CoV-2 RNA is generally detectable in upper and lower respiratory specimens during the acute phase of infection. The lowest concentration of SARS-CoV-2 viral copies this assay can detect is 250 copies / mL. A negative result does not preclude SARS-CoV-2 infection and should not be used as the sole basis for treatment or other patient management decisions.  A negative result may occur with improper specimen collection / handling, submission of specimen other than nasopharyngeal swab, presence of viral mutation(s) within the areas targeted by this assay, and inadequate number of viral copies (<250 copies / mL). A negative result must be combined with clinical observations, patient history, and epidemiological information.  Fact Sheet for Patients:   StrictlyIdeas.no  Fact Sheet for Healthcare Providers: BankingDealers.co.za  This test is not yet approved or  cleared by the [place] FDA and has been authorized for detection and/or diagnosis of SARS-CoV-2 by FDA under an Emergency Use Authorization (EUA).  This EUA will remain in effect (meaning this test can be used) for the duration of the COVID-19 declaration under Section 564(b)(1) of the Act, 21 U.S.C. section 360bbb-3(b)(1), unless the authorization is terminated or revoked sooner.  Performed at [facility], [place] [address] [place], [place] [address]          Radiology Studies: No results found.      Scheduled Meds: . apixaban  5 mg Oral BID  .  atorvastatin  20 mg Oral QHS  . budesonide (PULMICORT) nebulizer solution  0.25 mg Nebulization BID  . diltiazem  30 mg Oral Q6H  . memantine  28 mg Oral Daily   And  . donepezil  10 mg Oral QHS  . ferrous sulfate  325 mg Oral BID WC  . furosemide  40 mg Oral BID  . insulin aspart  0-9 Units Subcutaneous TID WC  . insulin glargine  10 Units Subcutaneous Daily  . ipratropium  0.5 mg Nebulization TID  . levalbuterol  0.63 mg Nebulization TID  . metoprolol tartrate  50 mg Oral BID  . modafinil  100 mg Oral Daily  . pantoprazole  40 mg Oral Daily  . perphenazine  4 mg Oral TID  . QUEtiapine  100 mg Oral QHS   Continuous Infusions:    LOS: 3 days    Time spent: 35 minutes    Zacharie Portner A Alyda Megna, MD [facility]   If 7PM-7AM, please contact night-coverage [email or web address]  07/18/2019, 10:09 AM

## 2019-07-18 NOTE — Evaluation (Signed)
Physical Therapy Evaluation Patient Details Name: Cheryl [name] MRN: [id number] DOB: Sep 08, 1940 Today's Date: 07/18/2019   History of Present Illness  Pt is 79 yo female with PMH of COPD, CHF, schizophrenia, dementia, and CKD.  Pt w/ recent d/c from hospital to SNF due to aflutter and COPD.  Pt now presents with worsening SHOB and resp failue.  Pt admitted with acute resp failure with hypoxia, a flutter/RVR; Chest -epigastric pain.  Clinical Impression  Pt admitted with above diagnosis. Pt requiring mod A for bed mobility and min A for transfer to chair.  She demonstrates impulsivity at times and requiring min guard for balance.  Pt with knees buckling/jerking with fatigue.  Pts O2 sats and BP were stable.  HR did increase to 128 bpm with minimal activity, further ambulation held due to this and fatigue/knees buckling.   Pt currently with functional limitations due to the deficits listed below (see PT Problem List). Pt will benefit from skilled PT to increase their independence and safety with mobility to allow discharge to the venue listed below.       Follow Up Recommendations SNF    Equipment Recommendations  None recommended by PT    Recommendations for Other Services       Precautions / Restrictions Precautions Precautions: Fall      Mobility  Bed Mobility Overal bed mobility: Needs Assistance Bed Mobility: Supine to Sit     Supine to sit: Mod assist;HOB elevated     General bed mobility comments: assist with feet to initiate and mod A to advance trunk  Transfers Overall transfer level: Needs assistance Equipment used: Rolling walker (2 wheeled) Transfers: Sit to/from Stand Sit to Stand: Min guard         General transfer comment: Sit to stand x 3; pt impulsive and noted knees buckling with fatigue, but did not require assist to stand  Ambulation/Gait Ambulation/Gait assistance: Min assist Gait Distance (Feet): 3 Feet Assistive device: Rolling walker (2  wheeled) Gait Pattern/deviations: Step-to pattern;Decreased stride length Gait velocity: decr   General Gait Details: steps to chair only; limited due to fatigued and elevated HR; noted knees buckling with fatigue; impulsive  Stairs            Wheelchair Mobility    Modified Rankin (Stroke Patients Only)       Balance Overall balance assessment: Needs assistance Sitting-balance support: Feet supported;No upper extremity supported Sitting balance-Leahy Scale: Fair Sitting balance - Comments: steady static balance   Standing balance support: Bilateral upper extremity supported;During functional activity Standing balance-Leahy Scale: Poor Standing balance comment: Required RW for balance; stood for 2 bouts 1-2 minutes for toielting ADLs; noted knees buckling with fatigue                             Pertinent Vitals/Pain Pain Assessment: No/denies pain    Home Living Family/patient expects to be discharged to:: [place]: Walker - 2 wheels      Prior Function           Comments: Pt is poor historian.  Prior to admission pt was at [facility] since 07/13/[facility].  Prior to that from [facility].  At last admission required assist of 2 for transfers, but per chart was ambulatory with RW at ALF.  Pt reports needing assist for all ADLs.     Hand  Dominance        Extremity/Trunk Assessment   Upper Extremity Assessment Upper Extremity Assessment: Defer to OT evaluation    Lower Extremity Assessment Lower Extremity Assessment: Difficult to assess due to impaired cognition (ROM WFL; MMT: at least 3/5 but unable to follow further commands)    Cervical / Trunk Assessment Cervical / Trunk Assessment: Normal  Communication   Communication: Expressive difficulties  Cognition Arousal/Alertness: Awake/alert Behavior During Therapy: WFL for tasks assessed/performed Overall Cognitive Status: No family/caregiver  present to determine baseline cognitive functioning                                 General Comments: Pt w advanced dementia; she was oriented to self only and poor historian.  Pt also frequently repeats the last thing said.  She followed simple commands ~50% of the time.      General Comments General comments (skin integrity, edema, etc.): HR 100 bpm rest and up to 128 bpm with transfer and standing; on 2 LPM O2 with stable sats; BP stable in all positions    Exercises     Assessment/Plan    PT Assessment Patient needs continued PT services  PT Problem List Decreased strength;Decreased range of motion;Decreased activity tolerance;Decreased balance;Decreased mobility;Decreased knowledge of use of DME;Decreased safety awareness;Decreased knowledge of precautions;Decreased coordination;Decreased cognition;Cardiopulmonary status limiting activity;Obesity       PT Treatment Interventions DME instruction;Gait training;Functional mobility training;Therapeutic activities;Therapeutic exercise;Balance training;Neuromuscular re-education;Cognitive remediation;Patient/family education    PT Goals (Current goals can be found in the Care Plan section)  Acute Rehab PT Goals Patient Stated Goal: none stated PT Goal Formulation: Patient unable to participate in goal setting Time For Goal Achievement: 08/01/19 Potential to Achieve Goals: Good    Frequency Min 2X/week   Barriers to discharge        Co-evaluation PT/OT/SLP Co-Evaluation/Treatment: Yes Reason for Co-Treatment: For patient/therapist safety (pt with recent hx +2 assist) PT goals addressed during session: Mobility/safety with mobility OT goals addressed during session: ADL's and self-care       AM-PAC PT "6 Clicks" Mobility  Outcome Measure Help needed turning from your back to your side while in a flat bed without using bedrails?: A Little Help needed moving from lying on your back to sitting on the side of a  flat bed without using bedrails?: A Lot Help needed moving to and from a bed to a chair (including a wheelchair)?: A Little Help needed standing up from a chair using your arms (e.g., wheelchair or bedside chair)?: A Little Help needed to walk in hospital room?: A Little Help needed climbing 3-5 steps with a railing? : A Lot 6 Click Score: 16    End of Session Equipment Utilized During Treatment: Gait belt Activity Tolerance: Patient tolerated treatment well Patient left: in chair;with call bell/phone within reach;with chair alarm set Nurse Communication: Mobility status PT Visit Diagnosis: Unsteadiness on feet (R26.81);Muscle weakness (generalized) (M62.81)    Time: 5520-8022 PT Time Calculation (min) (ACUTE ONLY): 34 min   Charges:   PT Evaluation $PT Eval Moderate Complexity: 1 Melina Schools, PT Acute Rehab Services Pager [phone] [facility] [phone]    Karlton Lemon 07/18/2019, 2:26 PM

## 2019-07-18 NOTE — Progress Notes (Signed)
Occupational Therapy Evaluation Patient Details Name: Cheryl [name] MRN: [id number] DOB: 1940-03-26 Today's Date: 07/18/2019    History of Present Illness Pt is 79 yo female with PMH of COPD, CHF, schizophrenia, dementia, and CKD.  Pt w/ recent d/c from hospital to SNF due to aflutter and COPD.  Pt now presents with worsening SHOB and resp failue.  Pt admitted with acute resp failure with hypoxia, a flutter/RVR; Chest -epigastric pain.   Clinical Impression   Pt typically resides at an ALF, but was admitted to hospital from SNF where pt was under short term rehab care. Pt presents now with limited assist for functional transfers using RW, requiring intermittent cues for safety and sequencing as pt with impulsivity. Pt noted with bowel/urine incontinence requiring Total A for clean up in standing with RW. HR elevated with activity, but all other vitals WFL. Recommend return to SNF for short term rehab to maximize safety and independence. Will continue to follow acutely.     Follow Up Recommendations  SNF;Supervision/Assistance - 24 hour    Equipment Recommendations  None recommended by OT    Recommendations for Other Services       Precautions / Restrictions Precautions Precautions: Fall Restrictions Weight Bearing Restrictions: No      Mobility Bed Mobility Overal bed mobility: Needs Assistance Bed Mobility: Supine to Sit     Supine to sit: Mod assist;HOB elevated     General bed mobility comments: assist with feet to initiate and mod A to advance trunk  Transfers Overall transfer level: Needs assistance Equipment used: Rolling walker (2 wheeled) Transfers: Sit to/from Stand Sit to Stand: Min guard         General transfer comment: Sit to stand x 3; pt impulsive and noted knees buckling with fatigue, but did not require assist to stand    Balance Overall balance assessment: Needs assistance Sitting-balance support: Feet supported;No upper extremity  supported Sitting balance-Leahy Scale: Fair Sitting balance - Comments: steady static balance   Standing balance support: Bilateral upper extremity supported;During functional activity Standing balance-Leahy Scale: Poor Standing balance comment: Required RW for balance; stood for 2 bouts 1-2 minutes for toielting ADLs; noted knees buckling with fatigue                           ADL either performed or assessed with clinical judgement   ADL Overall ADL's : Needs assistance/impaired Eating/Feeding: Set up;Supervision/ safety;Sitting Eating/Feeding Details (indicate cue type and reason): Setup for self feeding, did require cues for pacing as pt self feeds quickly  Grooming: Minimal assistance;Sitting   Upper Body Bathing: Minimal assistance;Sitting   Lower Body Bathing: Maximal assistance;Sit to/from stand   Upper Body Dressing : Minimal assistance;Sitting Upper Body Dressing Details (indicate cue type and reason): Min A to don new hospital gown Lower Body Dressing: Maximal assistance;Sitting/lateral leans   Toilet Transfer: Minimal assistance;Stand-pivot;RW   Toileting- Clothing Manipulation and Hygiene: Total assistance;Sit to/from stand Toileting - Clothing Manipulation Details (indicate cue type and reason): Total A for cleanup after bowel/urine incontinence in standing with RW     Functional mobility during ADLs: Minimal assistance;Rolling walker General ADL Comments: Pt requires frequent verbal cues for sequencing, attention to task, and safety, but able to follow one step commands. Pt limited by decreased strength, endurance and decreased awareness of deficits     Vision Baseline Vision/History: No visual deficits Patient Visual Report: No change from baseline Vision Assessment?: No apparent visual deficits  Perception     Praxis      Pertinent Vitals/Pain Pain Assessment: Faces Faces Pain Scale: No hurt     Hand Dominance Right   Extremity/Trunk  Assessment Upper Extremity Assessment Upper Extremity Assessment: Generalized weakness   Lower Extremity Assessment Lower Extremity Assessment: Defer to PT evaluation   Cervical / Trunk Assessment Cervical / Trunk Assessment: Normal   Communication Communication Communication: Expressive difficulties (echoes responses)   Cognition Arousal/Alertness: Awake/alert Behavior During Therapy: WFL for tasks assessed/performed Overall Cognitive Status: History of cognitive impairments - at baseline                                 General Comments: Pt w advanced dementia; she was oriented to self only and poor historian.  Pt also frequently repeats the last thing said.  She followed simple commands ~50% of the time.   General Comments  HR up to 128 bpm during activities, 100bpm at rest. On 2 L O2 with stable stats    Exercises     Shoulder Instructions      Home Living Family/patient expects to be discharged to:: [place]: [place] - 2 wheels   Additional Comments: Per chart, resident of [facility] and used RW for mobility. Pt required assist with ADLs at baseline      Prior Functioning/Environment Level of Independence: Independent with assistive device(s);Needs assistance  Gait / Transfers Assistance Needed: used RW at baseline for mobility  ADL's / Homemaking Assistance Needed: Required assistance with ADLs at baseline, but unsure of how much physical assist needed   Comments: Pt is poor historian.  Prior to admission pt was at [facility] since 07/13/[facility].  Prior to that from [facility].  At last admission required assist of 2 for transfers, but per chart was ambulatory with RW at ALF.  Pt reports needing assist for all ADLs.        OT Problem List: Decreased knowledge of use of DME or AE;Decreased cognition;Decreased safety awareness;Decreased strength;Impaired balance (sitting and/or  standing);Cardiopulmonary status limiting activity      OT Treatment/Interventions: Self-care/ADL training;Therapeutic exercise;Therapeutic activities;Energy conservation;DME and/or AE instruction;Patient/family education;Balance training    OT Goals(Current goals can be found in the care plan section) Acute Rehab OT Goals Patient Stated Goal: none stated OT Goal Formulation: With patient Time For Goal Achievement: 08/01/19 Potential to Achieve Goals: Fair ADL Goals Pt Will Perform Grooming: with supervision;standing Pt Will Perform Upper Body Dressing: with supervision;sitting Pt Will Perform Lower Body Dressing: with min assist;sitting/lateral leans;sit to/from stand Pt Will Transfer to Toilet: with supervision;ambulating;regular height toilet Pt Will Perform Toileting - Clothing Manipulation and hygiene: with min assist;sit to/from stand  OT Frequency: Min 2X/week   Barriers to D/C:            Co-evaluation PT/OT/SLP Co-Evaluation/Treatment: Yes Reason for Co-Treatment: For patient/therapist safety (Pt with recent +2 assist required) PT goals addressed during session: Mobility/safety with mobility OT goals addressed during session: ADL's and self-care      AM-PAC OT "6 Clicks" Daily Activity     Outcome Measure Help from another person eating meals?: A Little Help from another person taking care of personal grooming?: A Little Help from another person toileting, which includes using toliet, bedpan, or urinal?:  Total Help from another person bathing (including washing, rinsing, drying)?: A Lot Help from another person to put on and taking off regular upper body clothing?: A Little Help from another person to put on and taking off regular lower body clothing?: A Lot 6 Click Score: 14   End of Session Equipment Utilized During Treatment: Rolling walker;Oxygen Nurse Communication: Mobility status  Activity Tolerance: Patient tolerated treatment well Patient left: in  chair;with call bell/phone within reach  OT Visit Diagnosis: Other abnormalities of gait and mobility (R26.89);Muscle weakness (generalized) (M62.81);Other symptoms and signs involving cognitive function                Time: 1355-1414 OT Time Calculation (min): 19 min Charges:  OT General Charges $OT Visit: 1 Visit OT Evaluation $OT Eval Moderate Complexity: 1 Mod  Layla Maw, OTR/L  Layla Maw 07/18/2019, 3:08 PM

## 2019-07-18 NOTE — Progress Notes (Signed)
ANTICOAGULATION CONSULT NOTE - Initial Consult  Pharmacy Consult for Change Apixaban to IV Heparin Indication: chest pain/ACS  No Known Allergies  Patient Measurements: Height: 5\' 4"  (162.6 cm) Weight: 90.4 kg (199 lb 4.7 oz) IBW/kg (Calculated) : 54.7 Heparin Dosing Weight: 76.3 kg  Vital Signs: Temp: 97.9 F (36.6 C) (07/05 0753) Temp Source: Oral (07/05 0753) BP: 130/84 (07/05 0753) Pulse Rate: 84 (07/05 0753)  Labs: Recent Labs    07/16/19 0429 07/17/19 1011  HGB  --  11.7*  HCT  --  39.4  PLT  --  220  CREATININE 1.37* 1.52*    Estimated Creatinine Clearance: 33.8 mL/min (A) (by C-G formula based on SCr of 1.52 mg/dL (H)).   Medical History: Past Medical History:  Diagnosis Date  . COPD (chronic obstructive pulmonary disease) ([place])   . Dementia ([place])   . Diabetes mellitus without complication ([place])   . GERD (gastroesophageal reflux disease)   . Hyperlipidemia   . Hypertension   . Lung cancer ([place])   . Mental retardation   . Schizophrenia [facility])     Assessment: 79 year old female with history of atrial fibrillation on Apixaban. Cardiology now has consulted pharmacy to dose IV heparin for ACS with new onset chest pain.   Last dose Apixaban at 0950 AM.  Hgb stable at 11.7. Platelets are within normal limits.  No bleeding reported.  Recent Apixaban will effect heparin level -will dose based on aPTTs until levels are correlating.   Goal of Therapy:  Heparin level 0.3-0.7 units/ml Monitor platelets by anticoagulation protocol: Yes   Plan:  Heparin to start 12 hours after last Apixaban dose - at 2200 PM.  Start Heparin rate (no bolus) at 900 units/hr. APTT and Heparin level in 8 hours after initiation. Daily aPTT and heparin level while on therapy.  Sloan Leiter, PharmD, BCPS, BCCCP Clinical Pharmacist Please refer to [facility] for [place] numbers 07/18/2019,11:39 AM

## 2019-07-18 NOTE — NC FL2 (Signed)
[place] LEVEL OF CARE SCREENING TOOL     IDENTIFICATION  Patient Name: Cheryl [name] Birthdate: 27-Jan-1940 Sex: female Admission Date (Current Location): 07/14/2019  [facility] and [place] Number:  Herbalist and Address:  [facility] [place] [place] [address], [place], [place] [address]      Provider Number: [id number]  Attending Physician Name and Address:  Cheryl Shiley, MD  Relative Name and Phone Number:       Current Level of Care: Hospital Recommended Level of Care: [place] Prior Approval Number:    Date Approved/Denied:   PASRR Number: [id number]  Discharge Plan: SNF    Current Diagnoses: Patient Active Problem List   Diagnosis Date Noted   Acute on chronic systolic CHF (congestive heart failure) ([place]) 07/15/2019   Atrial flutter (HCC)    Sinus node dysfunction (HCC)    Persistent atrial fibrillation (HCC)    Systolic heart failure ([place])    Dementia ([place])    Type II diabetes mellitus with renal manifestations ([place])    Elevated troponin    Acute on chronic respiratory failure with hypoxia ([place])    Acute renal failure superimposed on stage 3a chronic kidney disease ([place])    Acute respiratory failure with hypoxia ([place])    Shortness of breath    COPD with acute exacerbation ([place]) 05/09/2019   Respiratory failure with hypoxia and hypercapnia ([place]) 05/09/2019   Hyperglycemia due to diabetes mellitus ([place]) 05/09/2019   GERD (gastroesophageal reflux disease) 05/09/2019   HTN (hypertension) 05/09/2019   HLD (hyperlipidemia) 05/09/2019   Moderate dementia, with behavioral disturbance ([place]) 10/06/2016   Incontinence of feces 05/30/2016   COPD exacerbation ([place]) 01/08/2016   Hypokalemia 02/07/2015   Cancer of left lung ([place]) 07/25/2011    Orientation RESPIRATION BLADDER Height & Weight     Self  O2 (4L nasal canula) Incontinent, External catheter Weight: 199 lb 4.7 oz (90.4  kg) Height:  5\' 4"  (162.6 cm)  BEHAVIORAL SYMPTOMS/MOOD NEUROLOGICAL BOWEL NUTRITION STATUS      Continent Diet (see discharge summary)  AMBULATORY STATUS COMMUNICATION OF NEEDS Skin   Extensive Assist Verbally  (generalized ecchymosis)                       Personal Care Assistance Level of Assistance  Bathing, Feeding, Dressing Bathing Assistance: Maximum assistance Feeding assistance: Limited assistance Dressing Assistance: Maximum assistance     Functional Limitations Info  Sight, Hearing, Speech Sight Info: Adequate Hearing Info: Adequate Speech Info: Adequate    SPECIAL CARE FACTORS FREQUENCY  OT (By licensed OT), PT (By licensed PT)     PT Frequency: 5x week OT Frequency: 5x week            Contractures Contractures Info: Not present    Additional Factors Info  Allergies, Code Status, Psychotropic, Insulin Sliding Scale Code Status Info: Full Code Allergies Info: No Known Allergies Psychotropic Info: memantine (NAMENDA XR) 24 hr capsule 28 mg daily PO; donepezil (ARICEPT) tablet 10 mg daily at bedtime PO; perphenazine (TRILAFON) tablet 4 mg 3x daily PO; QUEtiapine (SEROQUEL) tablet 100 mg daily at bedtime PO Insulin Sliding Scale Info: insulin aspart (novoLOG) injection 0-9 Units 3x daily with meals; insulin glargine (LANTUS) injection 10 Units daily       Current Medications (07/18/2019):  This is the current hospital active medication list Current Facility-Administered Medications  Medication Dose Route Frequency Provider Last Rate Last Admin   ALPRAZolam Duanne Moron) tablet  0.25 mg  0.25 mg Oral BID PRN Regalado, Belkys A, MD       apixaban (ELIQUIS) tablet 5 mg  5 mg Oral BID Lelon Perla, MD   5 mg at 07/18/19 0950   atorvastatin (LIPITOR) tablet 20 mg  20 mg Oral QHS Rise Patience, MD   20 mg at 07/16/19 2247   budesonide (PULMICORT) nebulizer solution 0.25 mg  0.25 mg Nebulization BID Rise Patience, MD   0.25 mg at 07/18/19 1478    diltiazem (CARDIZEM) tablet 30 mg  30 mg Oral Q6H Lelon Perla, MD       memantine (NAMENDA XR) 24 hr capsule 28 mg  28 mg Oral Daily Rise Patience, MD   28 mg at 07/18/19 0950   And   donepezil (ARICEPT) tablet 10 mg  10 mg Oral QHS Rise Patience, MD   10 mg at 07/16/19 2246   ferrous sulfate tablet 325 mg  325 mg Oral BID WC Rise Patience, MD   325 mg at 07/18/19 0949   furosemide (LASIX) tablet 40 mg  40 mg Oral BID Lelon Perla, MD   40 mg at 07/18/19 0950   insulin aspart (novoLOG) injection 0-9 Units  0-9 Units Subcutaneous TID WC Rise Patience, MD   2 Units at 07/17/19 1213   insulin glargine (LANTUS) injection 10 Units  10 Units Subcutaneous Daily Rise Patience, MD   10 Units at 07/18/19 0950   ipratropium (ATROVENT) nebulizer solution 0.5 mg  0.5 mg Nebulization TID Regalado, Belkys A, MD   0.5 mg at 07/18/19 0723   levalbuterol (XOPENEX) nebulizer solution 0.63 mg  0.63 mg Nebulization Q6H PRN Rise Patience, MD       levalbuterol Penne Lash) nebulizer solution 0.63 mg  0.63 mg Nebulization TID Regalado, Belkys A, MD   0.63 mg at 07/18/19 0723   metoprolol tartrate (LOPRESSOR) tablet 50 mg  50 mg Oral BID Lelon Perla, MD   50 mg at 07/18/19 0950   modafinil (PROVIGIL) tablet 100 mg  100 mg Oral Daily Rise Patience, MD   100 mg at 07/18/19 0950   nitroGLYCERIN (NITROSTAT) SL tablet 0.4 mg  0.4 mg Sublingual Q5 min PRN Regalado, Belkys A, MD       ondansetron (ZOFRAN) tablet 4 mg  4 mg Oral Q6H PRN Rise Patience, MD       Or   ondansetron [facility]) injection 4 mg  4 mg Intravenous Q6H PRN Rise Patience, MD       pantoprazole (PROTONIX) EC tablet 40 mg  40 mg Oral Daily Rise Patience, MD   40 mg at 07/18/19 0950   perphenazine (TRILAFON) tablet 4 mg  4 mg Oral TID Rise Patience, MD   4 mg at 07/18/19 2956   QUEtiapine (SEROQUEL) tablet 100 mg  100 mg Oral QHS Regalado, Belkys A, MD          Discharge Medications: Please see discharge summary for a list of discharge medications.  Relevant Imaging Results:  Relevant Lab Results:   Additional Information SSN: [id number]  [name]

## 2019-07-18 NOTE — Progress Notes (Signed)
Pm medication was held d/t pt wearing BiPap and being lethargic, pt arousable  at times and would open eyes upon calling her name and immediately go back to sleep.

## 2019-07-18 NOTE — Progress Notes (Signed)
Patient is awake and speaking. Patient taken off Bipap and placed on [place] 4 L with humidity. Sats 96%. RT will continue to monitor as needed.  RN notified.

## 2019-07-18 NOTE — Progress Notes (Signed)
Progress Note  Patient Name: Cheryl [name] Date of Encounter: 07/18/2019  [facility] Cardiologist: Kathlyn Sacramento, MD  Subjective   Difficult historian due to dementia, schizophrenia; remains somnolent on Bipap this AM; no CP or dyspnea  Inpatient Medications    Scheduled Meds:  apixaban  5 mg Oral BID   atorvastatin  20 mg Oral QHS   budesonide (PULMICORT) nebulizer solution  0.25 mg Nebulization BID   diltiazem  60 mg Oral Q6H   memantine  28 mg Oral Daily   And   donepezil  10 mg Oral QHS   ferrous sulfate  325 mg Oral BID WC   furosemide  40 mg Intravenous Q12H   insulin aspart  0-9 Units Subcutaneous TID WC   insulin glargine  10 Units Subcutaneous Daily   ipratropium  0.5 mg Nebulization TID   levalbuterol  0.63 mg Nebulization TID   metoprolol tartrate  25 mg Oral BID   modafinil  100 mg Oral Daily   pantoprazole  40 mg Oral Daily   perphenazine  4 mg Oral TID   QUEtiapine  100 mg Oral QHS   Continuous Infusions:  PRN Meds: ALPRAZolam, levalbuterol, ondansetron **OR** ondansetron (ZOFRAN) IV   Vital Signs    Vitals:   07/17/19 2019 07/18/19 0041 07/18/19 0401 07/18/19 0501  BP: 122/73  138/84 129/84  Pulse: 70 77 72 82  Resp: 17 15 15 17   Temp:   97.7 F (36.5 C)   TempSrc:   Axillary   SpO2: 100% 100% 99% 100%  Weight:   90.4 kg   Height:        Intake/Output Summary (Last 24 hours) at 07/18/2019 0719 Last data filed at 07/18/2019 0600 Gross per 24 hour  Intake --  Output 1500 ml  Net -1500 ml   Last 3 Weights 07/18/2019 07/17/2019 07/16/2019  Weight (lbs) 199 lb 4.7 oz 202 lb 13.2 oz 203 lb 14.8 oz  Weight (kg) 90.4 kg 92 kg 92.5 kg      Telemetry    Atrial flutter rate controlled- Personally Reviewed  Physical Exam   GEN: NAD, on Bipap Neck: supple, JVD difficult to assess Cardiac: irregular Respiratory: CTA anteriorly; no wheeze GI: Soft, NT/ND MS: No edema Neuro:  Moves all ext Psych: Repetitive   Labs    High  Sensitivity Troponin:   Recent Labs  Lab 07/03/19 1526 07/03/19 1918 07/15/19 0023 07/15/19 0221 07/15/19 0652  TROPONINIHS 42* 43* 41* 53* 56*      Chemistry Recent Labs  Lab 07/15/19 0950 07/15/19 0950 07/15/19 0955 07/16/19 0429 07/17/19 1011  NA 142   < > 142 144 139  K 4.3   < > 4.3 4.0 3.5  CL 101  --   --  98 93*  CO2 26  --   --  33* 31  GLUCOSE 316*  --   --  142* 161*  BUN 24*  --   --  22 23  CREATININE 1.66*  --   --  1.37* 1.52*  CALCIUM 8.9  --   --  9.0 8.9  GFRNONAA 29*  --   --  37* 33*  GFRAA 34*  --   --  43* 38*  ANIONGAP 15  --   --  13 15   < > = values in this interval not displayed.     Hematology Recent Labs  Lab 07/15/19 0023 07/15/19 0344 07/15/19 0652 07/15/19 0955 07/17/19 1011  WBC 9.1  --  8.9  --  8.8  RBC 4.10  --  4.36  --  4.28  HGB 11.0*   < > 11.8* 13.6 11.7*  HCT 37.8   < > 40.9 40.0 39.4  MCV 92.2  --  93.8  --  92.1  MCH 26.8  --  27.1  --  27.3  MCHC 29.1*  --  28.9*  --  29.7*  RDW 16.7*  --  16.6*  --  16.4*  PLT 313  --  240  --  220   < > = values in this interval not displayed.    BNP Recent Labs  Lab 07/15/19 0023  BNP 1,618.7*    Patient Profile     79 year old female with past medical history of diabetes, hypertension, hyperlipidemia, COPD, lung cancer, dementia, schizophrenia, atrial flutter for evaluation of atrial flutter with rapid ventricular response and CHF.  Patient recently discharged from [facility] following admission for COPD.  She did have atrial flutter at that time.  She received amiodarone and Toprol but apparently had a 5-second posttermination pause.  She was seen by Dr. Caryl Comes and rate control was recommended given her overall medical condition.  She was discharged on Toprol 50 mg daily and Xarelto.  Apparently she developed respiratory distress and was brought to [facility].  She was treated with diuretics with some improvement.  She is noted to be in atrial flutter and cardiology  asked to evaluate.  Recent echocardiogram showed ejection fraction 30 to 35%, mild left ventricular hypertrophy, mild right ventricular enlargement, biatrial enlargement, moderate tricuspid regurgitation.   Assessment & Plan    1 atrial flutter-patient's heart rate remains controlled.  Given cardiomyopathy I would prefer to control his heart rate predominantly with beta-blockade.  We will increase metoprolol to 50 mg twice daily and decrease Cardizem to 30 mg every 6 hours.  Hopefully we can increase metoprolol further and discontinue Cardizem completely.  Once metoprolol dose stable will transition to Toprol.  We will discontinue Lovenox and begin apixaban 5 mg twice daily.    2 acute on chronic systolic congestive heart failure-she appears to be euvolemic on examination today.  Will change Lasix to 40 mg twice daily and follow clinically.  Follow renal function closely.  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  Once metoprolol dose stable, change to toprol. Add losartan as outpt if BP allows. Given overall medical condition I do not think she would be a good candidate for ischemia evaluation.  4 acute on chronic stage III kidney disease-we will continue to follow renal function closely.  5 dementia/schizophrenia/mental retardation-as outlined previously it would be worthwhile to continue efforts to contact patient's family for discussions concerning aggressiveness of care.  For questions or updates, please contact [place] Please consult [email or web address] for contact info under        Signed, Kirk Ruths, MD  07/18/2019, 7:19 AM

## 2019-07-18 NOTE — Progress Notes (Signed)
Patient with episode of chest pain this morning. She is a poor historian but is able to communicate some sharp epigastric pain that has since resolved. She is not able to descirbe any further, or how long symptoms lasted. EKG does show new TWIs. She is listed as having dementia and schizophrenia. Rounding note this AM thought likely to be a poor cath candidate for her newly diagnosed systolic dysfunction. We will change her eliquis to heparin and start ASA, cycle enzymes. If significant NSTEMI would need to weight further elgibility for any interventional procedures. As of now pain free and hemodynamically stable.    Carlyle Dolly MD

## 2019-07-19 DIAGNOSIS — F0391 Unspecified dementia with behavioral disturbance: Secondary | ICD-10-CM

## 2019-07-19 LAB — TROPONIN I (HIGH SENSITIVITY)
Troponin I (High Sensitivity): 30 ng/L — ABNORMAL HIGH (ref <18)
Troponin I (High Sensitivity): 36 ng/L — ABNORMAL HIGH (ref <18)

## 2019-07-19 LAB — APTT
aPTT: 73 seconds — ABNORMAL HIGH (ref 24–36)
aPTT: 72 seconds — ABNORMAL HIGH (ref 24–36)

## 2019-07-19 LAB — BASIC METABOLIC PANEL
Anion gap: 12 (ref 5–15)
BUN: 25 mg/dL — ABNORMAL HIGH (ref 8–23)
CO2: 33 mmol/L — ABNORMAL HIGH (ref 22–32)
Calcium: 8.8 mg/dL — ABNORMAL LOW (ref 8.9–10.3)
Chloride: 95 mmol/L — ABNORMAL LOW (ref 98–111)
Creatinine, Ser: 1.5 mg/dL — ABNORMAL HIGH (ref 0.44–1.00)
GFR calc Af Amer: 39 mL/min — ABNORMAL LOW (ref >60)
GFR calc non Af Amer: 33 mL/min — ABNORMAL LOW (ref >60)
Glucose, Bld: 94 mg/dL (ref 70–99)
Potassium: 3.3 mmol/L — ABNORMAL LOW (ref 3.5–5.1)
Sodium: 140 mmol/L (ref 135–145)

## 2019-07-19 LAB — GLUCOSE, CAPILLARY
Glucose-Capillary: 91 mg/dL (ref 70–99)
Glucose-Capillary: 207 mg/dL — ABNORMAL HIGH (ref 70–99)
Glucose-Capillary: 103 mg/dL — ABNORMAL HIGH (ref 70–99)
Glucose-Capillary: 191 mg/dL — ABNORMAL HIGH (ref 70–99)

## 2019-07-19 LAB — HEPARIN LEVEL (UNFRACTIONATED): Heparin Unfractionated: 1.54 IU/mL — ABNORMAL HIGH (ref 0.30–0.70)

## 2019-07-19 LAB — SARS CORONAVIRUS 2 (TAT 6-24 HRS): SARS Coronavirus 2: NEGATIVE

## 2019-07-19 MED ORDER — APIXABAN 5 MG PO TABS
5.0000 mg | ORAL_TABLET | Freq: Two times a day (BID) | ORAL | Status: DC
  Administered 2019-07-19 – 2019-07-31 (×25): 5 mg via ORAL
  Filled 2019-07-19 (×25): qty 1

## 2019-07-19 MED ORDER — METOPROLOL TARTRATE 5 MG/5ML IV SOLN
5.0000 mg | Freq: Once | INTRAVENOUS | Status: AC
  Administered 2019-07-19: 5 mg via INTRAVENOUS
  Filled 2019-07-19: qty 5

## 2019-07-19 MED ORDER — POTASSIUM CHLORIDE CRYS ER 20 MEQ PO TBCR
40.0000 meq | EXTENDED_RELEASE_TABLET | Freq: Once | ORAL | Status: AC
  Administered 2019-07-19: 40 meq via ORAL
  Filled 2019-07-19: qty 2

## 2019-07-19 NOTE — Discharge Instructions (Signed)

## 2019-07-19 NOTE — Progress Notes (Signed)
ANTICOAGULATION CONSULT NOTE  Pharmacy Consult:  Heparin >> Eliquis Indication: chest pain/ACS  No Known Allergies  Patient Measurements: Height: 5\' 4"  (162.6 cm) Weight: 90.4 kg (199 lb 4.7 oz) IBW/kg (Calculated) : 54.7 Heparin Dosing Weight: 76.3 kg  Vital Signs: Temp: 98.6 F (37 C) (07/06 1144) Temp Source: Oral (07/06 1144) BP: 133/97 (07/06 1144) Pulse Rate: 62 (07/06 0717)  Labs: Recent Labs    07/17/19 1011 07/18/19 1246 07/19/19 0600 07/19/19 1203  HGB 11.7* 11.4*  --   --   HCT 39.4 38.7  --   --   PLT 220 238  --   --   APTT  --   --  73* 72*  HEPARINUNFRC  --   --  1.54*  --   CREATININE 1.52* 1.51* 1.50*  --   TROPONINIHS  --  38*  --   --     Estimated Creatinine Clearance: 34.2 mL/min (A) (by C-G formula based on SCr of 1.5 mg/dL (H)).   Assessment: 55 YOF with history of Afib on Xarelto PTA.  Patient was switch to Eliquis, and then to IV heparin due to new-onset chest pain.  Pharmacy now consulted to transition patient back on Eliquis.  Patient qualifies for full-dosing since age < 72 and weight > 60kg.  SCr has been fluctuating and currently at 1.5.  No bleeding reported.  Goal of Therapy:  Appropriate anticoagulation Monitor platelets by anticoagulation protocol: Yes   Plan:  D/C heparin when Eliquis is administered Eliquis 5mg  PO BID Pharmacy will sign off and follow peripherally.  Thank you for the consult!  Warden Buffa D. Mina Marble, PharmD, BCPS, [place] 07/19/2019, 12:56 PM

## 2019-07-19 NOTE — Progress Notes (Signed)
Occupational Therapy Treatment Patient Details Name: Cheryl [name] MRN: [id number] DOB: Dec 09, 1940 Today's Date: 07/19/2019    History of present illness Pt is 79 yo female with PMH of COPD, CHF, schizophrenia, dementia, and CKD.  Pt w/ recent d/c from hospital to SNF due to aflutter and COPD.  Pt now presents with worsening SHOB and resp failue.  Pt admitted with acute resp failure with hypoxia, a flutter/RVR; Chest -epigastric pain.   OT comments  Pt making steady progress towards OT goals this session. Pt continues to present with cognitive impairments, impaired balance and decreased ability to care for self impacting pts ability to complete BADLs. Pt required MOD A for bed mobility and MIN A for functional mobility with RW. One LOB noted when ambulation from EOB>sink needing MIN A to correct. Pt noted to be incontinent of stool during session needing total A for posterior pericare. Unable to obtain accurate HR reading during mobility however pt HR 123 bpm after activity with pt asymptomatic- RN aware. Pt on RA with O2 WFL. Dc plan remains appropriate, will follow acutely per POC.    Follow Up Recommendations  SNF;Supervision/Assistance - 24 hour    Equipment Recommendations  None recommended by OT    Recommendations for Other Services      Precautions / Restrictions Precautions Precautions: Fall Restrictions Weight Bearing Restrictions: No       Mobility Bed Mobility Overal bed mobility: Needs Assistance Bed Mobility: Supine to Sit     Supine to sit: Mod assist;HOB elevated     General bed mobility comments: MOD A to elevate trunk and scoot hips fully to EOB. Pt initiating but ultimately required MOD A  Transfers Overall transfer level: Needs assistance Equipment used: Rolling walker (2 wheeled) Transfers: Sit to/from Stand Sit to Stand: Min assist         General transfer comment: MIN A for RW mgmt and cues for hand placement. noted shaking in BLEs with fatigue  from standing for pericare    Balance Overall balance assessment: Needs assistance Sitting-balance support: Feet supported;No upper extremity supported Sitting balance-Leahy Scale: Fair Sitting balance - Comments: steady static balance   Standing balance support: Bilateral upper extremity supported;During functional activity Standing balance-Leahy Scale: Poor Standing balance comment: noted LOB when ambulating to sink needing MIN A to self correct. pt required BUE support and external assist for standing balance                           ADL either performed or assessed with clinical judgement   ADL Overall ADL's : Needs assistance/impaired             Lower Body Bathing: Total assistance;Sit to/from stand Lower Body Bathing Details (indicate cue type and reason): simulated via posterior pericare Upper Body Dressing : Maximal assistance;Sitting Upper Body Dressing Details (indicate cue type and reason): to don hospital gown     Toilet Transfer: Minimal assistance;RW;Ambulation Toilet Transfer Details (indicate cue type and reason): simulated via functional mobility with RW and miN A balance Toileting- Clothing Manipulation and Hygiene: Total assistance;Sit to/from stand Toileting - Clothing Manipulation Details (indicate cue type and reason): Total A for cleanup after bowel/urine incontinence in standing with RW     Functional mobility during ADLs: Minimal assistance;Rolling walker;Cueing for safety;Cueing for sequencing General ADL Comments: pt limited by cognitive impairments and impaired balance     Vision       Perception     Praxis  Cognition Arousal/Alertness: Awake/alert Behavior During Therapy: Flat affect Overall Cognitive Status: History of cognitive impairments - at baseline                                 General Comments: pt follows commands slowly with increased time, repeats words such as "okay" tactile cues needed for  directional cues in relation to managing RW        Exercises     Shoulder Instructions       General Comments pt on RA with O2 WFL. Could not obtain HR during mobility d/t lack of portable equipment however pt HR 123 bpm post mobility. pt noted to be incontinent of stool during session - RN aware    Pertinent Vitals/ Pain       Pain Assessment: Faces Faces Pain Scale: No hurt  Home Living                                          Prior Functioning/Environment              Frequency  Min 2X/week        Progress Toward Goals  OT Goals(current goals can now be found in the care plan section)  Progress towards OT goals: Progressing toward goals  Acute Rehab OT Goals Patient Stated Goal: none stated OT Goal Formulation: With patient Time For Goal Achievement: 08/01/19 Potential to Achieve Goals: [place] Discharge plan remains appropriate;Frequency remains appropriate    Co-evaluation                 AM-PAC OT "6 Clicks" Daily Activity     Outcome Measure   Help from another person eating meals?: A Little Help from another person taking care of personal grooming?: A Little Help from another person toileting, which includes using toliet, bedpan, or urinal?: Total Help from another person bathing (including washing, rinsing, drying)?: A Lot Help from another person to put on and taking off regular upper body clothing?: A Little Help from another person to put on and taking off regular lower body clothing?: A Lot 6 Click Score: 14    End of Session Equipment Utilized During Treatment: Gait belt;Rolling walker  OT Visit Diagnosis: Other abnormalities of gait and mobility (R26.89);Muscle weakness (generalized) (M62.81);Other symptoms and signs involving cognitive function   Activity Tolerance Patient tolerated treatment well   Patient Left in bed;with call bell/phone within reach;with bed alarm set   Nurse Communication Mobility  status;Other (comment) (tachy during session but unable to obtain accurate reading d/t lack of portable equipment)        Time: 8016-5537 OT Time Calculation (min): 27 min  Charges: OT General Charges $OT Visit: 1 Visit OT Treatments $Self Care/Home Management : 23-37 mins  Lanier Clam., COTA/L Acute Rehabilitation Services [phone]    Ihor Gully 07/19/2019, 4:19 PM

## 2019-07-19 NOTE — Progress Notes (Addendum)
Progress Note  Patient Name: Cheryl [name] Date of Encounter: 07/19/2019  Primary Cardiologist: Kathlyn Sacramento, MD  Subjective   Denies recurrent chest pain today. No SOB   Inpatient Medications    Scheduled Meds:  aspirin EC  81 mg Oral Daily   atorvastatin  20 mg Oral QHS   budesonide (PULMICORT) nebulizer solution  0.25 mg Nebulization BID   diltiazem  30 mg Oral Q6H   memantine  28 mg Oral Daily   And   donepezil  10 mg Oral QHS   ferrous sulfate  325 mg Oral BID WC   furosemide  40 mg Oral BID   insulin aspart  0-9 Units Subcutaneous TID WC   insulin glargine  10 Units Subcutaneous Daily   ipratropium  0.5 mg Nebulization TID   levalbuterol  0.63 mg Nebulization TID   metoprolol tartrate  50 mg Oral BID   modafinil  100 mg Oral Daily   pantoprazole  40 mg Oral Daily   perphenazine  4 mg Oral TID   QUEtiapine  100 mg Oral QHS   sodium chloride flush  10-40 mL Intracatheter Q12H   Continuous Infusions:  heparin 900 Units/hr (07/19/19 0200)   PRN Meds: ALPRAZolam, levalbuterol, nitroGLYCERIN, ondansetron **OR** ondansetron (ZOFRAN) IV, sodium chloride flush   Vital Signs    Vitals:   07/19/19 0305 07/19/19 0337 07/19/19 0717 07/19/19 0745  BP: (!) 154/67 116/90 (!) 149/74 (!) 154/81  Pulse: 62 63 62   Resp: 10  15 16   Temp:  97.7 F (36.5 C)  98.6 F (37 C)  TempSrc:  Oral  Oral  SpO2: 100%  100% 100%  Weight:      Height:        Intake/Output Summary (Last 24 hours) at 07/19/2019 1047 Last data filed at 07/19/2019 0615 Gross per 24 hour  Intake 174 ml  Output 750 ml  Net -576 ml   Filed Weights   07/16/19 0339 07/17/19 0500 07/18/19 0401  Weight: 92.5 kg 92 kg 90.4 kg    Physical Exam   General: Elderly, overweight, NAD Neck: Negative for carotid bruits. No JVD Lungs:Clear to ausculation bilaterally. No wheezes, rales, or rhonchi. Breathing is unlabored. Cardiovascular: Irregularly irregular with S1 S2. No  murmur Abdomen: Soft, non-tender, non-distended. No obvious abdominal masses. Extremities: No edema. Radial pulses 2+ bilaterally Neuro: Alert and oriented to self only. No focal deficits. No facial asymmetry. MAE spontaneously. Psych: Responds to questions somewhat appropriately with normal affect.    Labs    Chemistry Recent Labs  Lab 07/17/19 1011 07/18/19 1246 07/19/19 0600  NA 139 138 140  K 3.5 3.3* 3.3*  CL 93* 91* 95*  CO2 31 36* 33*  GLUCOSE 161* 164* 94  BUN 23 19 25*  CREATININE 1.52* 1.51* 1.50*  CALCIUM 8.9 8.9 8.8*  GFRNONAA 33* 33* 33*  GFRAA 38* 38* 39*  ANIONGAP 15 11 12      Hematology Recent Labs  Lab 07/15/19 3086 07/15/19 0652 07/15/19 0955 07/17/19 1011 07/18/19 1246  WBC 8.9  --   --  8.8 6.2  RBC 4.36  --   --  4.28 4.23  HGB 11.8*   < > 13.6 11.7* 11.4*  HCT 40.9   < > 40.0 39.4 38.7  MCV 93.8  --   --  92.1 91.5  MCH 27.1  --   --  27.3 27.0  MCHC 28.9*  --   --  29.7* 29.5*  RDW 16.6*  --   --  16.4* 15.9*  PLT 240  --   --  220 238   < > = values in this interval not displayed.    Cardiac EnzymesNo results for input(s): TROPONINI in the last 168 hours. No results for input(s): TROPIPOC in the last 168 hours.   BNP Recent Labs  Lab 07/15/19 0023  BNP 1,618.7*     DDimer No results for input(s): DDIMER in the last 168 hours.   Radiology    No results found.  Telemetry    07/19/19 AF with stable rates in the 70's  - Personally Reviewed  ECG    No new tracing as of 07/19/19- Personally Reviewed  Cardiac Studies   Echocardiogram 07/04/19:  . Left ventricular ejection fraction, by estimation, is 30 to 35%. The  left ventricle has moderately decreased function. The left ventricle  demonstrates global hypokinesis. There is mild left ventricular  hypertrophy. Left ventricular diastolic  parameters are indeterminate.  2. Right ventricular systolic function is mildly reduced. The right  ventricular size is mildly enlarged.  There is mildly elevated pulmonary  artery systolic pressure.  3. Left atrial size was mildly dilated.  4. Right atrial size was moderately dilated.  5. The mitral valve is normal in structure. Trivial mitral valve  regurgitation. No evidence of mitral stenosis.  6. Tricuspid valve regurgitation is moderate.  7. The aortic valve is normal in structure. Aortic valve regurgitation is  trivial. Mild aortic valve sclerosis is present, with no evidence of  aortic valve stenosis.  8. Moderately dilated pulmonary artery.   Patient Profile     79 y.o. female with past medical history of diabetes, hypertension, hyperlipidemia, COPD, lung cancer, dementia, schizophrenia, atrial flutter for evaluation of atrial flutter with rapid ventricular response and CHF. Patient recently discharged from [facility] following admission for COPD. She did have atrial flutter at that time. She received amiodarone and Toprol but apparently had a 5-second posttermination pause. She was seen by Dr. Caryl Comes and rate control was recommended given her overall medical condition. She was discharged on Toprol 50 mg daily and Xarelto. Apparently she developed respiratory distress and was brought to [facility]. She was treated with diuretics with some improvement. She is noted to be in atrial flutter and cardiology asked to evaluate. Recent echocardiogram showed ejection fraction 30 to 35%, mild left ventricular hypertrophy, mild right ventricular enlargement, biatrial enlargement, moderate tricuspid regurgitation.   Assessment & Plan    1 Atrial flutter: -Plan per Dr. Stanford Breed was for rate control with beta-blocker at this time.  -Metoprolol increased to 50 mg twice daily and Cardizem was decreased to 30 mg every 6 hours.  -Will increase metoprolol to 75mg  PO BID>>plan to wean diltiazem off   -Once metoprolol dose stable will transition to Toprol.   -Plan yesterday was to stop Lovenox and start Eliquis however  with the chest pain yesterday afternoon, IV Heparin was started  -Will discuss further ischemic workup with rounding MD>>previously not felt to be a great candidate given dementia and chronic co-morbid conditions  -She had no recurrence on exam today. Order placed for hsT to ensure no up-trend -Likely will treat medically and can start Eliquis later today  2. Acute on chronic systolic congestive heart failure: -Appears to be euvolemic on examination today -Lasix changed to 40 mg twice daily yesterday>>tolerating well  -Follow renal function closely>>creatinine stable from yesterday   3. Cardiomyopathy, unclear etiology>>ischemic versus tachycardia mediated:  -Metoprolol added to regimen with plans to transition to Toprol  once at a stable dose  -Will add losartan as outpt if BP and renal function allow  -Not felt to be a candidate for invasive ischemic workup -Pt had episode of chest pain yesterday afternoon at which time cardiology was contacted>>plan was for repeat hsT>>awaiting for results   4. Acute on chronic stage III kidney disease: -Creatinine, 1.50 -Continue to follow renal function closely   5. Dementia/schizophrenia/mental retardation: -As outlined previously it would be worthwhile to continue efforts to contact patient's family for discussions concerning aggressiveness of care   Signed, Kathyrn Drown NP-C [facility] Pager: [phone] 07/19/2019, 10:47 AM     For questions or updates, please contact   Please consult [email or web address] for contact info under Cardiology/STEMI.  Attending Note:   The patient was seen and examined.  Agree with assessment and plan as noted above.  Changes made to the above note as needed.  Patient seen and independently examined with  Kathyrn Drown, NP .   We discussed all aspects of the encounter. I agree with the assessment and plan as stated above.  1.   Atrial flutter:  HR is better .  Cont current meds  2.  CHF:  Agree with medical  therapy.   She is a very poor candidate for any invasive procedures.    3.  Dementia:   Plans per IM   4.  CKD:     I have spent a total of 40 minutes with patient reviewing hospital  notes , telemetry, EKGs, labs and examining patient as well as establishing an assessment and plan that was discussed with the patient. > 50% of time was spent in direct patient care.    Thayer Headings, Brooke Bonito., MD, [facility] 07/19/2019, 12:17 PM [address]  [place] Pager [phone]

## 2019-07-19 NOTE — Progress Notes (Addendum)
PROGRESS NOTE    Cheryl [name]  TSV:[id number] DOB: [birth year] DOA: 07/14/2019 PCP: Letta Median, MD   Brief Narrative: 79 year old COPD, Systolic Heart EF 92-33 %, schizophrenia, CKD stage B , recently discharge from [facility] treated for A flutter, COPD HF exacerbation, discharge day prior to admission. Presents with worsening SOB, Respiratory failure.   Patient admitted with acute hypoxic hypercapnic respiratory failure secondary to heart failure exacerbation, new ejection fraction at 35%.  She was treated with IV Lasix and was placed on BiPAP.  Her respiratory status subsequently improved.  Patient developed chest pain on 7 8/5.  She was transitioned to IV heparin from Eliquis.  Cardiology is not planning future intervention. Plan to transition to oral Eliquis today, patient may be able to be discharged on 70/7 if respiratory status and renal function are stable.   Assessment & Plan:   Principal Problem:   Acute respiratory failure with hypoxia (HCC) Active Problems:   COPD exacerbation (HCC)   HTN (hypertension)   Dementia (HCC)   Persistent atrial fibrillation (HCC)   Acute on chronic systolic CHF (congestive heart failure) (HCC)  1-Acute Hypoxic Respiratory Failure, hypercapnic.  Secondary to acute on chronic systolic heart failure exacerbation ejection fraction 35% Secondary to Heart failure exacerbation.  Continue with nebulizer treatment.  Patient place on BIPAP. CCM consulted and subsequent sign off.  On IV lasix 40 mg IV BID> patient was transitioned to oral Lasix on 7/5 Discontinue solumedrol per CCM recommendations.  Continue with BIPAP at HS and PRN Oxygen sat 95 on 2 L.  He has not required BiPAP during the day, she will continue to require BiPAP  at bedtime. Denies dyspnea  2-Acute systolic heart failure exacerbation: She was treated with IV Lasix.  Cardiology following.  A flutter  RVR;  Treated initially with IV metoprolol and Cardizem to oral Cardizem  and metoprolol.  She has a 5 second postermination pause while on amiodarone and toprol.  She was transition from Lovenox to Eliquis.  TSH normal.  Appreciate cardiology assistance  Chest pain, epigastric pain; on 70/5 EKG changes. ST depression anterior lead.  Troponin lower than on admission Nitroglycerin given.  Cardiology inform of event.  Plan to transition to Eliquis today  Diabetes Type 2;  SSI.   Chronic anemia;  Follow hb.  Hb stable at 11.   Acute on Chronic renal failure stage III; b, cr baseline 1.3  Monitor on lasix.   Hyperlipidemia; continue with statins.  History of schizophrenia; continue with xanax and seraquel. Decrease Seroquel. Change xanax to PRN I spoke with [facility]; She has Developmental delay  and she doesn't speak a lot, she will repeat what is said to her.   History of dementia; Namzaric.  She is more alert. Oriented to person only. I called  [facility]( [phone] ) to find out patient baseline. no answers.   History of lung cancer S./p radiation.   Estimated body mass index is 34.21 kg/m as calculated from the following:   Height as of this encounter: 5\' 4"  (1.626 m).   Weight as of this encounter: 90.4 kg.   DVT prophylaxis: Lovenox Code Status: Full code Family Communication: Son updated over phone7/04. Son updated 7/06 Disposition Plan:  Status is: Inpatient  Remains inpatient appropriate because:Hemodynamically unstable and Persistent severe electrolyte disturbances   Dispo: The patient is from: Home              Anticipated d/c is to: SNF  Anticipated d/c date is: 1 day              Patient currently is not medically stable to d/c.  Repeat renal function in the morning if renal function is stable patient can be transferred to a skilled nursing facility on 7/7        Consultants:   CCM  Cardiology   Procedures:   None  Antimicrobials:    Subjective: She is alert, she denies chest pain  this morning.  She is breathing better. She stutters.   Objective: Vitals:   07/19/19 0717 07/19/19 0745 07/19/19 1144 07/19/19 1411  BP: (!) 149/74 (!) 154/81 (!) 133/97   Pulse: 62     Resp: 15 16 19    Temp:  98.6 F (37 C) 98.6 F (37 C)   TempSrc:  Oral Oral   SpO2: 100% 100% 98% 97%  Weight:      Height:        Intake/Output Summary (Last 24 hours) at 07/19/2019 1414 Last data filed at 07/19/2019 1200 Gross per 24 hour  Intake 219 ml  Output 750 ml  Net -531 ml   Filed Weights   07/16/19 0339 07/17/19 0500 07/18/19 0401  Weight: 92.5 kg 92 kg 90.4 kg    Examination:  General exam; NAD CVS;S 1, S 2 RRR Respiratory system: No tachypnea, no crackles Gastrointestinal system: Bowel sounds present, soft nontender not distended Central nervous system: Alert Extremities: No rashes Skin; No rashes.     Data Reviewed: I have personally reviewed following labs and imaging studies  CBC: Recent Labs  Lab 07/13/19 0605 07/13/19 0605 07/15/19 0023 07/15/19 0023 07/15/19 0344 07/15/19 0652 07/15/19 0955 07/17/19 1011 07/18/19 1246  WBC 5.7  --  9.1  --   --  8.9  --  8.8 6.2  NEUTROABS  --   --  6.7  --   --  8.6*  --   --   --   HGB 11.1*   < > 11.0*   < > 12.9 11.8* 13.6 11.7* 11.4*  HCT 36.2   < > 37.8   < > 38.0 40.9 40.0 39.4 38.7  MCV 89.4  --  92.2  --   --  93.8  --  92.1 91.5  PLT 300  --  313  --   --  240  --  220 238   < > = values in this interval not displayed.   Basic Metabolic Panel: Recent Labs  Lab 07/15/19 0950 07/15/19 0950 07/15/19 0955 07/16/19 0429 07/17/19 1011 07/18/19 1246 07/19/19 0600  NA 142   < > 142 144 139 138 140  K 4.3   < > 4.3 4.0 3.5 3.3* 3.3*  CL 101  --   --  98 93* 91* 95*  CO2 26  --   --  33* 31 36* 33*  GLUCOSE 316*  --   --  142* 161* 164* 94  BUN 24*  --   --  22 23 19  25*  CREATININE 1.66*  --   --  1.37* 1.52* 1.51* 1.50*  CALCIUM 8.9  --   --  9.0 8.9 8.9 8.8*   < > = values in this interval not  displayed.   GFR: Estimated Creatinine Clearance: 34.2 mL/min (A) (by C-G formula based on SCr of 1.5 mg/dL (H)). Liver Function Tests: No results for input(s): AST, ALT, ALKPHOS, BILITOT, PROT, ALBUMIN in the last 168 hours. No results for input(s): LIPASE, AMYLASE  in the last 168 hours. No results for input(s): AMMONIA in the last 168 hours. Coagulation Profile: No results for input(s): INR, PROTIME in the last 168 hours. Cardiac Enzymes: No results for input(s): CKTOTAL, CKMB, CKMBINDEX, TROPONINI in the last 168 hours. BNP (last 3 results) No results for input(s): PROBNP in the last 8760 hours. HbA1C: No results for input(s): HGBA1C in the last 72 hours. CBG: Recent Labs  Lab 07/18/19 1221 07/18/19 1638 07/18/19 2112 07/19/19 0745 07/19/19 1143  GLUCAP 175* 146* 85 91 207*   Lipid Profile: No results for input(s): CHOL, HDL, LDLCALC, TRIG, CHOLHDL, LDLDIRECT in the last 72 hours. Thyroid Function Tests: No results for input(s): TSH, T4TOTAL, FREET4, T3FREE, THYROIDAB in the last 72 hours. Anemia Panel: No results for input(s): VITAMINB12, FOLATE, FERRITIN, TIBC, IRON, RETICCTPCT in the last 72 hours. Sepsis Labs: No results for input(s): PROCALCITON, LATICACIDVEN in the last 168 hours.  Recent Results (from the past 240 hour(s))  SARS CORONAVIRUS 2 (TAT 6-24 HRS) Nasopharyngeal Nasopharyngeal Swab     Status: None   Collection Time: 07/14/19 12:00 PM   Specimen: Nasopharyngeal Swab  Result Value Ref Range Status   SARS Coronavirus 2 NEGATIVE NEGATIVE Final    Comment: (NOTE) SARS-CoV-2 target nucleic acids are NOT DETECTED.  The SARS-CoV-2 RNA is generally detectable in upper and lower respiratory specimens during the acute phase of infection. Negative results do not preclude SARS-CoV-2 infection, do not rule out co-infections with other pathogens, and should not be used as the sole basis for treatment or other patient management decisions. Negative results must  be combined with clinical observations, patient history, and epidemiological information. The expected result is Negative.  Fact Sheet for Patients: SugarRoll.be  Fact Sheet for Healthcare Providers: https://www.woods-mathews.com/  This test is not yet approved or cleared by the [place] FDA and  has been authorized for detection and/or diagnosis of SARS-CoV-2 by FDA under an Emergency Use Authorization (EUA). This EUA will remain  in effect (meaning this test can be used) for the duration of the COVID-19 declaration under Se ction 564(b)(1) of the Act, 21 U.S.C. section 360bbb-3(b)(1), unless the authorization is terminated or revoked sooner.  Performed at [facility], [place] [address], [place], [place] [address]   SARS Coronavirus 2 by RT PCR (hospital order, performed in [facility] hospital lab) Nasopharyngeal Nasopharyngeal Swab     Status: None   Collection Time: 07/15/19 12:28 AM   Specimen: Nasopharyngeal Swab  Result Value Ref Range Status   SARS Coronavirus 2 NEGATIVE NEGATIVE Final    Comment: (NOTE) SARS-CoV-2 target nucleic acids are NOT DETECTED.  The SARS-CoV-2 RNA is generally detectable in upper and lower respiratory specimens during the acute phase of infection. The lowest concentration of SARS-CoV-2 viral copies this assay can detect is 250 copies / mL. A negative result does not preclude SARS-CoV-2 infection and should not be used as the sole basis for treatment or other patient management decisions.  A negative result may occur with improper specimen collection / handling, submission of specimen other than nasopharyngeal swab, presence of viral mutation(s) within the areas targeted by this assay, and inadequate number of viral copies (<250 copies / mL). A negative result must be combined with clinical observations, patient history, and epidemiological information.  Fact Sheet for Patients:     StrictlyIdeas.no  Fact Sheet for Healthcare Providers: BankingDealers.co.za  This test is not yet approved or  cleared by the [place] FDA and has been authorized for detection  and/or diagnosis of SARS-CoV-2 by FDA under an Emergency Use Authorization (EUA).  This EUA will remain in effect (meaning this test can be used) for the duration of the COVID-19 declaration under Section 564(b)(1) of the Act, 21 U.S.C. section 360bbb-3(b)(1), unless the authorization is terminated or revoked sooner.  Performed at [facility], [place] [address], [place], [place] [address]          Radiology Studies: No results found.      Scheduled Meds: . apixaban  5 mg Oral BID  . aspirin EC  81 mg Oral Daily  . atorvastatin  20 mg Oral QHS  . budesonide (PULMICORT) nebulizer solution  0.25 mg Nebulization BID  . diltiazem  30 mg Oral Q6H  . memantine  28 mg Oral Daily   And  . donepezil  10 mg Oral QHS  . ferrous sulfate  325 mg Oral BID WC  . furosemide  40 mg Oral BID  . insulin aspart  0-9 Units Subcutaneous TID WC  . insulin glargine  10 Units Subcutaneous Daily  . ipratropium  0.5 mg Nebulization TID  . levalbuterol  0.63 mg Nebulization TID  . metoprolol tartrate  50 mg Oral BID  . modafinil  100 mg Oral Daily  . pantoprazole  40 mg Oral Daily  . perphenazine  4 mg Oral TID  . QUEtiapine  100 mg Oral QHS  . sodium chloride flush  10-40 mL Intracatheter Q12H   Continuous Infusions:    LOS: 4 days    Time spent: 35 minutes    Cris Gibby A Earline Stiner, MD [facility]   If 7PM-7AM, please contact night-coverage [email or web address]  07/19/2019, 2:14 PM

## 2019-07-19 NOTE — Progress Notes (Signed)
[facility] for Apixaban >>> Heparin Indication: chest pain/ACS  No Known Allergies  Patient Measurements: Height: 5\' 4"  (162.6 cm) Weight: 90.4 kg (199 lb 4.7 oz) IBW/kg (Calculated) : 54.7 Heparin Dosing Weight: 76.3 kg  Vital Signs: Temp: 97.7 F (36.5 C) (07/06 0337) Temp Source: Oral (07/06 0337) BP: 116/90 (07/06 0337) Pulse Rate: 63 (07/06 0337)  Labs: Recent Labs    07/17/19 1011 07/18/19 1246 07/19/19 0600  HGB 11.7* 11.4*  --   HCT 39.4 38.7  --   PLT 220 238  --   APTT  --   --  73*  HEPARINUNFRC  --   --  1.54*  CREATININE 1.52* 1.51* 1.50*  TROPONINIHS  --  38*  --     Estimated Creatinine Clearance: 34.2 mL/min (A) (by C-G formula based on SCr of 1.5 mg/dL (H)).   Medical History: Past Medical History:  Diagnosis Date  . COPD (chronic obstructive pulmonary disease) ([place])   . Dementia ([place])   . Diabetes mellitus without complication ([place])   . GERD (gastroesophageal reflux disease)   . Hyperlipidemia   . Hypertension   . Lung cancer ([place])   . Mental retardation   . Schizophrenia [facility])     Assessment: 79 year old female with history of atrial fibrillation on Apixaban. Cardiology now has consulted pharmacy to dose IV heparin for ACS with new onset chest pain.   Last dose Apixaban at 0950 AM.  Hgb stable at 11.7. Platelets are within normal limits.  No bleeding reported.  Recent Apixaban will effect heparin level -will dose based on aPTTs until levels are correlating.   7/6 AM update:  APTT therapeutic this AM  Goal of Therapy:  APTT 66-102 secs Heparin level 0.3-0.7 units/ml Monitor platelets by anticoagulation protocol: Yes   Plan:  Cont heparin at 900 units/hr  1300 aPTT Daily aPTT and heparin level while on therapy.  Narda Bonds, PharmD, BCPS Clinical Pharmacist Phone: [phone]

## 2019-07-19 NOTE — TOC Progression Note (Addendum)
Transition of Care [facility]) - Progression Note    Patient Details  Name: Cheryl [name] MRN: [id number] Date of Birth: [birth year]  Transition of [facility]) CM/SW [place], [place] Phone Number: 07/19/2019, 11:10 AM  Clinical Narrative: 2:38pm- Additional call made to [facility] admissions liaison Olivia Mackie, informing her pt ready for d/c 7/7. Requested a new COVID swab, Dr. Tyrell Antonio aware.     11:10am- CSW has reached out to [employer] regarding pt, CSW has d/c legal guardian banner x2 as pt does not have any documented court appointed legal guardian. If she does in fact have legal guardian then that individual needs to bring paperwork or fax to hospital.   [facility] team continuing to follow.    Expected Discharge Plan: Skilled Nursing Facility Barriers to Discharge: Continued Medical Work up  Expected Discharge Plan and Services Expected Discharge Plan: [place] arrangements for the past 2 months: [place]  Readmission Risk Interventions Readmission Risk Prevention Plan 05/10/2019  Transportation Screening Complete  PCP or Specialist Appt within 3-5 Days Complete  HRI or Home Care Consult Complete  Medication Review (RN Care Manager) Complete  Some recent data might be hidden

## 2019-07-19 NOTE — Progress Notes (Signed)
Ice chips given at frequent interval, tolerated well.

## 2019-07-20 DIAGNOSIS — I4819 Other persistent atrial fibrillation: Secondary | ICD-10-CM

## 2019-07-20 LAB — BASIC METABOLIC PANEL
Anion gap: 9 (ref 5–15)
BUN: 30 mg/dL — ABNORMAL HIGH (ref 8–23)
CO2: 35 mmol/L — ABNORMAL HIGH (ref 22–32)
Calcium: 8.9 mg/dL (ref 8.9–10.3)
Chloride: 95 mmol/L — ABNORMAL LOW (ref 98–111)
Creatinine, Ser: 1.69 mg/dL — ABNORMAL HIGH (ref 0.44–1.00)
GFR calc Af Amer: 33 mL/min — ABNORMAL LOW (ref >60)
GFR calc non Af Amer: 29 mL/min — ABNORMAL LOW (ref >60)
Glucose, Bld: 111 mg/dL — ABNORMAL HIGH (ref 70–99)
Potassium: 3.9 mmol/L (ref 3.5–5.1)
Sodium: 139 mmol/L (ref 135–145)

## 2019-07-20 LAB — GLUCOSE, CAPILLARY
Glucose-Capillary: 94 mg/dL (ref 70–99)
Glucose-Capillary: 187 mg/dL — ABNORMAL HIGH (ref 70–99)
Glucose-Capillary: 122 mg/dL — ABNORMAL HIGH (ref 70–99)
Glucose-Capillary: 103 mg/dL — ABNORMAL HIGH (ref 70–99)

## 2019-07-20 MED ORDER — POTASSIUM CHLORIDE CRYS ER 10 MEQ PO TBCR
10.0000 meq | EXTENDED_RELEASE_TABLET | Freq: Two times a day (BID) | ORAL | Status: DC
  Administered 2019-07-20 – 2019-07-22 (×5): 10 meq via ORAL
  Filled 2019-07-20 (×5): qty 1

## 2019-07-20 MED ORDER — TORSEMIDE 20 MG PO TABS
40.0000 mg | ORAL_TABLET | Freq: Every day | ORAL | Status: DC
  Administered 2019-07-20 – 2019-07-21 (×2): 40 mg via ORAL
  Filled 2019-07-20 (×3): qty 2

## 2019-07-20 NOTE — Progress Notes (Signed)
Patient mews score in yellow due to elevated heart rate.Vital signs as follows temp.97.9 HR 122 RR 18 B/P 140/85 sats 100% room air.Counselling psychologist and Dr. Myna Hidalgo notified.New orders received and carried out.Will follow frequent vital signs per mews protocol.

## 2019-07-20 NOTE — Progress Notes (Addendum)
PROGRESS NOTE    Cheryl [name]  HYQ:[id number] DOB: [birth year] DOA: 07/14/2019 PCP: Letta Median, MD   Brief Narrative: 79 year old COPD, Systolic Heart EF 95-28 %, schizophrenia, CKD, recently discharge from [facility] treated for A flutter, COPD HF exacerbation, presents with worsening SOB, Respiratory failure. Patient admitted with acute hypoxic hypercapnic respiratory failure secondary to heart failure exacerbation, new ejection fraction at 35%.  She was treated with IV Lasix and was placed on BiPAP.  Her respiratory status subsequently improved.  Patient developed chest pain,  was transitioned to IV heparin from Eliquis.  Cardiology is not planning future intervention   Assessment & Plan:   Principal Problem:   Acute respiratory failure with hypoxia (HCC) Active Problems:   COPD exacerbation (HCC)   HTN (hypertension)   Dementia (HCC)   Persistent atrial fibrillation (HCC)   Acute on chronic systolic CHF (congestive heart failure) (HCC)   Acute hypoxic/hypercapneic respiratory failure Acute on chronic systolic heart failure exacerbation ejection fraction 35% Currently transitioned to room air Cardiology on board,/P IV Lasix, changed p.o. Lasix to torsemide Continue nebs treatments Continue with supplemental oxygen as needed, BIPAP at HS and PRN  A flutter  RVR TSH normal Heart rate controlled Continue oral Cardizem, metoprolol, Eliquis Noted 5 second pause while on amiodarone and toprol Cardiology on board, appreciate recs  Chest pain, epigastric pain EKG changes. ST depression anterior lead.  Troponin trending downwards Cardiology on board, no further intervention  Diabetes mellitus type 2 SSI, Lantus, Accu-Cheks, hypoglycemic protocol  AKI on CKD stage IIIb Baseline creatinine around 1.3  Daily BMP while on diuretics  Hyperlipidemia Continue with statins.   History of schizophrenia Continue with xanax as needed and decreased seroquel  History of  dementia She is more alert. Oriented to person only [facility] was contacted [phone]), patient has developmental delay and she doesn't speak a lot, she will repeat what is said to her  History of lung cancer  S./p radiation     Estimated body mass index is 34.21 kg/m as calculated from the following:   Height as of this encounter: 5\' 4"  (1.626 m).   Weight as of this encounter: 90.4 kg.   DVT prophylaxis: Eliquis Code Status: Full code Family Communication: Son updated over phone7/6 Disposition Plan:  Status is: Inpatient  Remains inpatient appropriate because:Hemodynamically unstable, Persistent severe electrolyte disturbances and Inpatient level of care appropriate due to severity of illness   Dispo: The patient is from: ALF              Anticipated d/c is to: SNF              Anticipated d/c date is: 1 day              Patient currently is not medically stable to d/c.     Consultants:   CCM  Cardiology   Procedures:   None  Antimicrobials:    Subjective: Patient seen and examined at bedside.  Denies any new complaints, has developmental delay, patient stutters.  Denies worsening shortness of breath, chest pain, abdominal pain, nausea/vomiting, fever/chills.  Objective: Vitals:   07/20/19 0715 07/20/19 0754 07/20/19 1238 07/20/19 1410  BP:  140/68    Pulse:      Resp:  16 16   Temp:  98.6 F (37 C) 98.6 F (37 C)   TempSrc:  Oral Oral   SpO2: 95% 95%  97%  Weight:      Height:  Intake/Output Summary (Last 24 hours) at 07/20/2019 1557 Last data filed at 07/20/2019 0500 Gross per 24 hour  Intake 120 ml  Output 1500 ml  Net -1380 ml   Filed Weights   07/16/19 0339 07/17/19 0500 07/18/19 0401  Weight: 92.5 kg 92 kg 90.4 kg    Examination:   General: NAD, stutters   Cardiovascular: S1, S2 present  Respiratory: CTAB  Abdomen: Soft, nontender, nondistended, bowel sounds present  Musculoskeletal: No bilateral pedal edema  noted  Skin: Normal  Psychiatry: Normal mood     Data Reviewed: I have personally reviewed following labs and imaging studies  CBC: Recent Labs  Lab 07/15/19 0023 07/15/19 0023 07/15/19 0344 07/15/19 0652 07/15/19 0955 07/17/19 1011 07/18/19 1246  WBC 9.1  --   --  8.9  --  8.8 6.2  NEUTROABS 6.7  --   --  8.6*  --   --   --   HGB 11.0*   < > 12.9 11.8* 13.6 11.7* 11.4*  HCT 37.8   < > 38.0 40.9 40.0 39.4 38.7  MCV 92.2  --   --  93.8  --  92.1 91.5  PLT 313  --   --  240  --  220 238   < > = values in this interval not displayed.   Basic Metabolic Panel: Recent Labs  Lab 07/16/19 0429 07/17/19 1011 07/18/19 1246 07/19/19 0600 07/20/19 0426  NA 144 139 138 140 139  K 4.0 3.5 3.3* 3.3* 3.9  CL 98 93* 91* 95* 95*  CO2 33* 31 36* 33* 35*  GLUCOSE 142* 161* 164* 94 111*  BUN 22 23 19  25* 30*  CREATININE 1.37* 1.52* 1.51* 1.50* 1.69*  CALCIUM 9.0 8.9 8.9 8.8* 8.9   GFR: Estimated Creatinine Clearance: 30.4 mL/min (A) (by C-G formula based on SCr of 1.69 mg/dL (H)). Liver Function Tests: No results for input(s): AST, ALT, ALKPHOS, BILITOT, PROT, ALBUMIN in the last 168 hours. No results for input(s): LIPASE, AMYLASE in the last 168 hours. No results for input(s): AMMONIA in the last 168 hours. Coagulation Profile: No results for input(s): INR, PROTIME in the last 168 hours. Cardiac Enzymes: No results for input(s): CKTOTAL, CKMB, CKMBINDEX, TROPONINI in the last 168 hours. BNP (last 3 results) No results for input(s): PROBNP in the last 8760 hours. HbA1C: No results for input(s): HGBA1C in the last 72 hours. CBG: Recent Labs  Lab 07/19/19 1143 07/19/19 1649 07/19/19 2024 07/20/19 0754 07/20/19 1235  GLUCAP 207* 103* 191* 94 187*   Lipid Profile: No results for input(s): CHOL, HDL, LDLCALC, TRIG, CHOLHDL, LDLDIRECT in the last 72 hours. Thyroid Function Tests: No results for input(s): TSH, T4TOTAL, FREET4, T3FREE, THYROIDAB in the last 72  hours. Anemia Panel: No results for input(s): VITAMINB12, FOLATE, FERRITIN, TIBC, IRON, RETICCTPCT in the last 72 hours. Sepsis Labs: No results for input(s): PROCALCITON, LATICACIDVEN in the last 168 hours.  Recent Results (from the past 240 hour(s))  SARS CORONAVIRUS 2 (TAT 6-24 HRS) Nasopharyngeal Nasopharyngeal Swab     Status: None   Collection Time: 07/14/19 12:00 PM   Specimen: Nasopharyngeal Swab  Result Value Ref Range Status   SARS Coronavirus 2 NEGATIVE NEGATIVE Final    Comment: (NOTE) SARS-CoV-2 target nucleic acids are NOT DETECTED.  The SARS-CoV-2 RNA is generally detectable in upper and lower respiratory specimens during the acute phase of infection. Negative results do not preclude SARS-CoV-2 infection, do not rule out co-infections with other pathogens, and should not be  used as the sole basis for treatment or other patient management decisions. Negative results must be combined with clinical observations, patient history, and epidemiological information. The expected result is Negative.  Fact Sheet for Patients: SugarRoll.be  Fact Sheet for Healthcare Providers: https://www.woods-mathews.com/  This test is not yet approved or cleared by the [place] FDA and  has been authorized for detection and/or diagnosis of SARS-CoV-2 by FDA under an Emergency Use Authorization (EUA). This EUA will remain  in effect (meaning this test can be used) for the duration of the COVID-19 declaration under Se ction 564(b)(1) of the Act, 21 U.S.C. section 360bbb-3(b)(1), unless the authorization is terminated or revoked sooner.  Performed at [facility], [place] [address] [place], [place] [address]   SARS Coronavirus 2 by RT PCR (hospital order, performed in [facility] hospital lab) Nasopharyngeal Nasopharyngeal Swab     Status: None   Collection Time: 07/15/19 12:28 AM   Specimen: Nasopharyngeal Swab  Result Value Ref Range  Status   SARS Coronavirus 2 NEGATIVE NEGATIVE Final    Comment: (NOTE) SARS-CoV-2 target nucleic acids are NOT DETECTED.  The SARS-CoV-2 RNA is generally detectable in upper and lower respiratory specimens during the acute phase of infection. The lowest concentration of SARS-CoV-2 viral copies this assay can detect is 250 copies / mL. A negative result does not preclude SARS-CoV-2 infection and should not be used as the sole basis for treatment or other patient management decisions.  A negative result may occur with improper specimen collection / handling, submission of specimen other than nasopharyngeal swab, presence of viral mutation(s) within the areas targeted by this assay, and inadequate number of viral copies (<250 copies / mL). A negative result must be combined with clinical observations, patient history, and epidemiological information.  Fact Sheet for Patients:   StrictlyIdeas.no  Fact Sheet for Healthcare Providers: BankingDealers.co.za  This test is not yet approved or  cleared by the [place] FDA and has been authorized for detection and/or diagnosis of SARS-CoV-2 by FDA under an Emergency Use Authorization (EUA).  This EUA will remain in effect (meaning this test can be used) for the duration of the COVID-19 declaration under Section 564(b)(1) of the Act, 21 U.S.C. section 360bbb-3(b)(1), unless the authorization is terminated or revoked sooner.  Performed at [facility], [place] [address] [place], [place] [address]   SARS CORONAVIRUS 2 (TAT 6-24 HRS) Nasopharyngeal Nasopharyngeal Swab     Status: None   Collection Time: 07/19/19  3:24 PM   Specimen: Nasopharyngeal Swab  Result Value Ref Range Status   SARS Coronavirus 2 NEGATIVE NEGATIVE Final    Comment: (NOTE) SARS-CoV-2 target nucleic acids are NOT DETECTED.  The SARS-CoV-2 RNA is generally detectable in upper and lower respiratory specimens during  the acute phase of infection. Negative results do not preclude SARS-CoV-2 infection, do not rule out co-infections with other pathogens, and should not be used as the sole basis for treatment or other patient management decisions. Negative results must be combined with clinical observations, patient history, and epidemiological information. The expected result is Negative.  Fact Sheet for Patients: SugarRoll.be  Fact Sheet for Healthcare Providers: https://www.woods-mathews.com/  This test is not yet approved or cleared by the [place] FDA and  has been authorized for detection and/or diagnosis of SARS-CoV-2 by FDA under an Emergency Use Authorization (EUA). This EUA will remain  in effect (meaning this test can be used) for the duration of the COVID-19 declaration under Se ction  564(b)(1) of the Act, 21 U.S.C. section 360bbb-3(b)(1), unless the authorization is terminated or revoked sooner.  Performed at [facility], [place] [address] [place], [place] [address]          Radiology Studies: No results found.      Scheduled Meds: . apixaban  5 mg Oral BID  . aspirin EC  81 mg Oral Daily  . atorvastatin  20 mg Oral QHS  . budesonide (PULMICORT) nebulizer solution  0.25 mg Nebulization BID  . diltiazem  30 mg Oral Q6H  . memantine  28 mg Oral Daily   And  . donepezil  10 mg Oral QHS  . ferrous sulfate  325 mg Oral BID WC  . insulin aspart  0-9 Units Subcutaneous TID WC  . insulin glargine  10 Units Subcutaneous Daily  . ipratropium  0.5 mg Nebulization TID  . levalbuterol  0.63 mg Nebulization TID  . metoprolol tartrate  50 mg Oral BID  . modafinil  100 mg Oral Daily  . pantoprazole  40 mg Oral Daily  . perphenazine  4 mg Oral TID  . potassium chloride  10 mEq Oral BID  . QUEtiapine  100 mg Oral QHS  . sodium chloride flush  10-40 mL Intracatheter Q12H  . torsemide  40 mg Oral Daily   Continuous  Infusions:    LOS: 5 days   Alma Friendly, MD [facility]   If 7PM-7AM, please contact night-coverage [email or web address]  07/20/2019, 3:57 PM

## 2019-07-20 NOTE — Care Management (Signed)
1717 07-20-19 Case Manager did speak with Liaison with [facility]-SNF bed is available for this patient. COVID done on 07-19-19. Case Manager will continue to follow for additional transition of care needs. Bethena Roys, RN,BSN Case Manager

## 2019-07-20 NOTE — Progress Notes (Signed)
Progress Note  Patient Name: Cheryl [name] Date of Encounter: 07/20/2019  Primary Cardiologist: Kathlyn Sacramento, MD  Subjective   Pt is eating breakfast.  Pleasantly demented.  Continues to be hypoxic    Inpatient Medications    Scheduled Meds: . apixaban  5 mg Oral BID  . aspirin EC  81 mg Oral Daily  . atorvastatin  20 mg Oral QHS  . budesonide (PULMICORT) nebulizer solution  0.25 mg Nebulization BID  . diltiazem  30 mg Oral Q6H  . memantine  28 mg Oral Daily   And  . donepezil  10 mg Oral QHS  . ferrous sulfate  325 mg Oral BID WC  . insulin aspart  0-9 Units Subcutaneous TID WC  . insulin glargine  10 Units Subcutaneous Daily  . ipratropium  0.5 mg Nebulization TID  . levalbuterol  0.63 mg Nebulization TID  . metoprolol tartrate  50 mg Oral BID  . modafinil  100 mg Oral Daily  . pantoprazole  40 mg Oral Daily  . perphenazine  4 mg Oral TID  . QUEtiapine  100 mg Oral QHS  . sodium chloride flush  10-40 mL Intracatheter Q12H   Continuous Infusions:  PRN Meds: ALPRAZolam, levalbuterol, nitroGLYCERIN, ondansetron **OR** ondansetron (ZOFRAN) IV, sodium chloride flush   Vital Signs    Vitals:   07/20/19 0433 07/20/19 0605 07/20/19 0715 07/20/19 0754  BP: 137/70 135/80  140/68  Pulse: 61     Resp: 16 20  16   Temp:    98.6 F (37 C)  TempSrc:    Oral  SpO2: 100% 100% 95% 95%  Weight:      Height:        Intake/Output Summary (Last 24 hours) at 07/20/2019 0851 Last data filed at 07/20/2019 0500 Gross per 24 hour  Intake 356 ml  Output 1500 ml  Net -1144 ml   Filed Weights   07/16/19 0339 07/17/19 0500 07/18/19 0401  Weight: 92.5 kg 92 kg 90.4 kg    Physical Exam   Physical Exam: Blood pressure 140/68, pulse 61, temperature 98.6 F (37 C), temperature source Oral, resp. rate 16, height 5\' 4"  (1.626 m), weight 90.4 kg, SpO2 95 %.  GEN:   Elderly female  HEENT: Normal NECK: No JVD; No carotid bruits LYMPHATICS: No lymphadenopathy CARDIAC:  irreg. Irreg.  RESPIRATORY:  Clear to auscultation without rales, wheezing or rhonchi  ABDOMEN: Soft, non-tender, non-distended MUSCULOSKELETAL:  No edema; No deformity  SKIN: Warm and dry NEUROLOGIC:  Alert and oriented x 3   Labs    Chemistry Recent Labs  Lab 07/18/19 1246 07/19/19 0600 07/20/19 0426  NA 138 140 139  K 3.3* 3.3* 3.9  CL 91* 95* 95*  CO2 36* 33* 35*  GLUCOSE 164* 94 111*  BUN 19 25* 30*  CREATININE 1.51* 1.50* 1.69*  CALCIUM 8.9 8.8* 8.9  GFRNONAA 33* 33* 29*  GFRAA 38* 39* 33*  ANIONGAP 11 12 9      Hematology Recent Labs  Lab 07/15/19 0177 07/15/19 0652 07/15/19 0955 07/17/19 1011 07/18/19 1246  WBC 8.9  --   --  8.8 6.2  RBC 4.36  --   --  4.28 4.23  HGB 11.8*   < > 13.6 11.7* 11.4*  HCT 40.9   < > 40.0 39.4 38.7  MCV 93.8  --   --  92.1 91.5  MCH 27.1  --   --  27.3 27.0  MCHC 28.9*  --   --  29.7* 29.5*  RDW 16.6*  --   --  16.4* 15.9*  PLT 240  --   --  220 238   < > = values in this interval not displayed.    Cardiac EnzymesNo results for input(s): TROPONINI in the last 168 hours. No results for input(s): TROPIPOC in the last 168 hours.   BNP Recent Labs  Lab 07/15/19 0023  BNP 1,618.7*     DDimer No results for input(s): DDIMER in the last 168 hours.   Radiology    No results found.  Telemetry     difficult to assess with patient movement ,  Appears to be in atrial fib  - Personally Reviewed  ECG    No new tracing as of 07/19/19- Personally Reviewed  Cardiac Studies   Echocardiogram 07/04/19:  . Left ventricular ejection fraction, by estimation, is 30 to 35%. The  left ventricle has moderately decreased function. The left ventricle  demonstrates global hypokinesis. There is mild left ventricular  hypertrophy. Left ventricular diastolic  parameters are indeterminate.  2. Right ventricular systolic function is mildly reduced. The right  ventricular size is mildly enlarged. There is mildly elevated pulmonary    artery systolic pressure.  3. Left atrial size was mildly dilated.  4. Right atrial size was moderately dilated.  5. The mitral valve is normal in structure. Trivial mitral valve  regurgitation. No evidence of mitral stenosis.  6. Tricuspid valve regurgitation is moderate.  7. The aortic valve is normal in structure. Aortic valve regurgitation is  trivial. Mild aortic valve sclerosis is present, with no evidence of  aortic valve stenosis.  8. Moderately dilated pulmonary artery.   Patient Profile     79 y.o. female with past medical history of diabetes, hypertension, hyperlipidemia, COPD, lung cancer, dementia, schizophrenia, atrial flutter for evaluation of atrial flutter with rapid ventricular response and CHF. Patient recently discharged from [facility] following admission for COPD. She did have atrial flutter at that time. She received amiodarone and Toprol but apparently had a 5-second posttermination pause. She was seen by Dr. Caryl Comes and rate control was recommended given her overall medical condition. She was discharged on Toprol 50 mg daily and Xarelto. Apparently she developed respiratory distress and was brought to [facility]. She was treated with diuretics with some improvement. She is noted to be in atrial flutter and cardiology asked to evaluate. Recent echocardiogram showed ejection fraction 30 to 35%, mild left ventricular hypertrophy, mild right ventricular enlargement, biatrial enlargement, moderate tricuspid regurgitation.   Assessment & Plan    1 Atrial flutter: -Plan per Dr. Stanford Breed was for rate control with beta-blocker at this time.  -Metoprolol increased to 50 mg twice daily and Cardizem was decreased to 30 mg every 6 hours.  Cont eliquis , metoprolol, diltiazem  for rate control   2. Acute on chronic systolic congestive heart failure: Has diuresed 4.3 liters so far this admission Troponin is very minimally elevated with a flat trend  Will  start torsemide, kdur  and follow renal function closely   3. Cardiomyopathy, unclear etiology>>ischemic versus tachycardia mediated:     4. Acute on chronic stage III kidney disease:  further management per IM    5. Dementia/schizophrenia/mental retardation:    Would continue conservative therapy    Mertie Moores, MD  07/20/2019 9:02 AM    [facility] [place],  [place], [place]  [address] Phone: [phone]; Fax: [phone]

## 2019-07-21 DIAGNOSIS — I484 Atypical atrial flutter: Secondary | ICD-10-CM

## 2019-07-21 LAB — CBC WITH DIFFERENTIAL/PLATELET
Abs Immature Granulocytes: 0.01 10*3/uL (ref 0.00–0.07)
Basophils Absolute: 0.1 10*3/uL (ref 0.0–0.1)
Basophils Relative: 1 %
Eosinophils Absolute: 0.3 10*3/uL (ref 0.0–0.5)
Eosinophils Relative: 5 %
HCT: 40.1 % (ref 36.0–46.0)
Hemoglobin: 12.1 g/dL (ref 12.0–15.0)
Immature Granulocytes: 0 %
Lymphocytes Relative: 33 %
Lymphs Abs: 1.8 10*3/uL (ref 0.7–4.0)
MCH: 26.8 pg (ref 26.0–34.0)
MCHC: 30.2 g/dL (ref 30.0–36.0)
MCV: 88.9 fL (ref 80.0–100.0)
Monocytes Absolute: 0.6 10*3/uL (ref 0.1–1.0)
Monocytes Relative: 11 %
Neutro Abs: 2.7 10*3/uL (ref 1.7–7.7)
Neutrophils Relative %: 50 %
Platelets: 266 10*3/uL (ref 150–400)
RBC: 4.51 MIL/uL (ref 3.87–5.11)
RDW: 15.2 % (ref 11.5–15.5)
WBC: 5.4 10*3/uL (ref 4.0–10.5)
nRBC: 0 % (ref 0.0–0.2)

## 2019-07-21 LAB — GLUCOSE, CAPILLARY
Glucose-Capillary: 108 mg/dL — ABNORMAL HIGH (ref 70–99)
Glucose-Capillary: 342 mg/dL — ABNORMAL HIGH (ref 70–99)
Glucose-Capillary: 160 mg/dL — ABNORMAL HIGH (ref 70–99)

## 2019-07-21 LAB — BASIC METABOLIC PANEL
Anion gap: 11 (ref 5–15)
BUN: 22 mg/dL (ref 8–23)
CO2: 33 mmol/L — ABNORMAL HIGH (ref 22–32)
Calcium: 9.5 mg/dL (ref 8.9–10.3)
Chloride: 95 mmol/L — ABNORMAL LOW (ref 98–111)
Creatinine, Ser: 1.68 mg/dL — ABNORMAL HIGH (ref 0.44–1.00)
GFR calc Af Amer: 34 mL/min — ABNORMAL LOW (ref >60)
GFR calc non Af Amer: 29 mL/min — ABNORMAL LOW (ref >60)
Glucose, Bld: 110 mg/dL — ABNORMAL HIGH (ref 70–99)
Potassium: 3.5 mmol/L (ref 3.5–5.1)
Sodium: 139 mmol/L (ref 135–145)

## 2019-07-21 MED ORDER — METOPROLOL TARTRATE 50 MG PO TABS
75.0000 mg | ORAL_TABLET | Freq: Two times a day (BID) | ORAL | Status: DC
  Administered 2019-07-21 – 2019-07-31 (×20): 75 mg via ORAL
  Filled 2019-07-21 (×20): qty 1

## 2019-07-21 NOTE — Progress Notes (Signed)
Physical Therapy Treatment Patient Details Name: Cheryl [name] MRN: [id number] DOB: [birth year] Today's Date: 07/21/2019    History of Present Illness Pt is 79 yo female with PMH of COPD, CHF, schizophrenia, dementia, and CKD.  Pt w/ recent d/c from hospital to SNF due to aflutter and COPD.  Pt now presents with worsening SHOB and resp failue.  Pt admitted with acute resp failure with hypoxia, a flutter/RVR; Chest -epigastric pain.    PT Comments    Pt making progress with transfers; however, was limited in gait distance due to elevated HR and fatigued after multiple standing bouts for tolieting ADLs.   Required multimodal cues for safety and min guard for balance.  Cont to progress as able.    Follow Up Recommendations  SNF     Equipment Recommendations  None recommended by PT    Recommendations for Other Services       Precautions / Restrictions Precautions Precautions: Fall Restrictions Weight Bearing Restrictions: No    Mobility  Bed Mobility Overal bed mobility: Needs Assistance Bed Mobility: Supine to Sit     Supine to sit: Min guard        Transfers Overall transfer level: Needs assistance Equipment used: None Transfers: Sit to/from Stand Sit to Stand: Min assist         General transfer comment: sit to stand x 4 with tactile cues for safety  Ambulation/Gait Ambulation/Gait assistance: Min assist Gait Distance (Feet): 4 Feet (4'x4) Assistive device: 1 person hand held assist Gait Pattern/deviations: Decreased stride length;Trunk flexed Gait velocity: decr   General Gait Details: Pt not using walker in room and limited due to space.  Pt holding on therapist hand, bed rail, and window sill.  Min A for steadying.   Stairs             Wheelchair Mobility    Modified Rankin (Stroke Patients Only)       Balance Overall balance assessment: Needs assistance Sitting-balance support: Feet supported;No upper extremity supported Sitting  balance-Leahy Scale: Good       Standing balance-Leahy Scale: Poor Standing balance comment: reaquired UE support during toileting ADLs;  Stood for 3 mins x 3 for adls and weight shifting                            Cognition Arousal/Alertness: Awake/alert Behavior During Therapy: WFL for tasks assessed/performed Overall Cognitive Status: History of cognitive impairments - at baseline                                 General Comments: Pt follows commands with increased time and tactile cues; repeats words frequently      Exercises      General Comments General comments (skin integrity, edema, etc.): Pt on RA with stable O2 ssats.  HR 115-130 bpm throughout session so limited to short distance ambulation due to HR and incontinent of stool.      Pertinent Vitals/Pain Pain Assessment: No/denies pain    Home Living                      Prior Function            PT Goals (current goals can now be found in the care plan section) Acute Rehab PT Goals Patient Stated Goal: none stated PT Goal Formulation: Patient unable to participate in goal setting Time  For Goal Achievement: 08/01/19 Potential to Achieve Goals: Good Progress towards PT goals: Progressing toward goals    Frequency    Min 2X/week      PT Plan Current plan remains appropriate    Co-evaluation              AM-PAC PT "6 Clicks" Mobility   Outcome Measure  Help needed turning from your back to your side while in a flat bed without using bedrails?: A Little Help needed moving from lying on your back to sitting on the side of a flat bed without using bedrails?: A Little Help needed moving to and from a bed to a chair (including a wheelchair)?: A Little Help needed standing up from a chair using your arms (e.g., wheelchair or bedside chair)?: A Little Help needed to walk in hospital room?: A Little Help needed climbing 3-5 steps with a railing? : A Lot 6 Click Score:  17    End of Session Equipment Utilized During Treatment: Gait belt Activity Tolerance: Patient tolerated treatment well Patient left: in chair;with call bell/phone within reach;with chair alarm set Nurse Communication: Mobility status (nurse tech into assist with adls) PT Visit Diagnosis: Unsteadiness on feet (R26.81);Muscle weakness (generalized) (M62.81)     Time: 1025-1050 PT Time Calculation (min) (ACUTE ONLY): 25 min  Charges:  $Gait Training: 8-22 mins $Therapeutic Activity: 8-22 mins                     Abran Richard, PT Acute Rehab Services Pager [phone] [facility] 07/21/2019, 11:07 AM

## 2019-07-21 NOTE — Progress Notes (Signed)
PROGRESS NOTE    Cheryl [name]  QBH:[id number] DOB: 09-02-1940 DOA: 07/14/2019 PCP: Letta Median, MD   Brief Narrative: 79 year old COPD, Systolic Heart EF 09-73 %, schizophrenia, CKD, recently discharge from [facility] treated for A flutter, COPD HF exacerbation, presents with worsening SOB, Respiratory failure. Patient admitted with acute hypoxic hypercapnic respiratory failure secondary to heart failure exacerbation, new ejection fraction at 35%.  She was treated with IV Lasix and was placed on BiPAP.  Her respiratory status subsequently improved.  Patient developed chest pain,  was transitioned to IV heparin from Eliquis.  Cardiology is not planning future intervention   Assessment & Plan:   Principal Problem:   Acute respiratory failure with hypoxia (HCC) Active Problems:   COPD exacerbation (HCC)   HTN (hypertension)   Dementia (HCC)   Persistent atrial fibrillation (HCC)   Acute on chronic systolic CHF (congestive heart failure) (HCC)   Acute hypoxic/hypercapneic respiratory failure Acute on chronic systolic heart failure exacerbation ejection fraction 35% Currently transitioned to room air Cardiology on board,/P IV Lasix, changed p.o. Lasix to torsemide Continue nebs treatments Continue with supplemental oxygen as needed, BIPAP at HS and PRN  A flutter  RVR TSH normal Heart rate controlled Continue oral Cardizem, metoprolol, Eliquis Noted 5 second pause while on amiodarone and toprol Cardiology on board, appreciate recs  Chest pain, epigastric pain EKG changes. ST depression anterior lead.  Troponin trending downwards Cardiology on board, no further intervention  Diabetes mellitus type 2 SSI, Lantus, Accu-Cheks, hypoglycemic protocol  AKI on CKD stage IIIb Baseline creatinine around 1.3  Daily BMP while on diuretics  Hyperlipidemia Continue with statins.   History of schizophrenia Continue with xanax as needed and decreased seroquel  History of  dementia She is more alert. Oriented to person only [facility] was contacted ([phone]), patient has developmental delay and she doesn't speak a lot, she will repeat what is said to her  History of lung cancer  S./p radiation     Estimated body mass index is 34.21 kg/m as calculated from the following:   Height as of this encounter: 5\' 4"  (1.626 m).   Weight as of this encounter: 90.4 kg.   DVT prophylaxis: Eliquis Code Status: Full code Family Communication: Son updated over phone7/6 Disposition Plan:  Status is: Inpatient  Remains inpatient appropriate because:Hemodynamically unstable, Persistent severe electrolyte disturbances and Inpatient level of care appropriate due to severity of illness   Dispo: The patient is from: ALF              Anticipated d/c is to: SNF              Anticipated d/c date is: 1 day              Patient currently is not medically stable to d/c.     Consultants:   CCM  Cardiology   Procedures:   None  Antimicrobials:    Subjective: Patient seen and examined at bedside.  Denied any new complaints.  Objective: Vitals:   07/21/19 0808 07/21/19 1141 07/21/19 1401 07/21/19 1702  BP: 116/67 (!) 121/55  (!) 120/48  Pulse: 67 98 (!) 54 82  Resp:   16   Temp: 97.9 F (36.6 C) 97.9 F (36.6 C)  98.6 F (37 C)  TempSrc: Oral Oral    SpO2:   96%   Weight:      Height:        Intake/Output Summary (Last 24 hours) at 07/21/2019 1813  Last data filed at 07/21/2019 1706 Gross per 24 hour  Intake 120 ml  Output 2150 ml  Net -2030 ml   Filed Weights   07/16/19 0339 07/17/19 0500 07/18/19 0401  Weight: 92.5 kg 92 kg 90.4 kg    Examination:   General: NAD, stutters   Cardiovascular: S1, S2 present  Respiratory: CTAB  Abdomen: Soft, nontender, nondistended, bowel sounds present  Musculoskeletal: No bilateral pedal edema noted  Skin: Normal  Psychiatry: Normal mood     Data Reviewed: I have personally reviewed  following labs and imaging studies  CBC: Recent Labs  Lab 07/15/19 0023 07/15/19 0344 07/15/19 0652 07/15/19 0955 07/17/19 1011 07/18/19 1246 07/21/19 0301  WBC 9.1  --  8.9  --  8.8 6.2 5.4  NEUTROABS 6.7  --  8.6*  --   --   --  2.7  HGB 11.0*   < > 11.8* 13.6 11.7* 11.4* 12.1  HCT 37.8   < > 40.9 40.0 39.4 38.7 40.1  MCV 92.2  --  93.8  --  92.1 91.5 88.9  PLT 313  --  240  --  220 238 266   < > = values in this interval not displayed.   Basic Metabolic Panel: Recent Labs  Lab 07/17/19 1011 07/18/19 1246 07/19/19 0600 07/20/19 0426 07/21/19 0301  NA 139 138 140 139 139  K 3.5 3.3* 3.3* 3.9 3.5  CL 93* 91* 95* 95* 95*  CO2 31 36* 33* 35* 33*  GLUCOSE 161* 164* 94 111* 110*  BUN 23 19 25* 30* 22  CREATININE 1.52* 1.51* 1.50* 1.69* 1.68*  CALCIUM 8.9 8.9 8.8* 8.9 9.5   GFR: Estimated Creatinine Clearance: 30.5 mL/min (A) (by C-G formula based on SCr of 1.68 mg/dL (H)). Liver Function Tests: No results for input(s): AST, ALT, ALKPHOS, BILITOT, PROT, ALBUMIN in the last 168 hours. No results for input(s): LIPASE, AMYLASE in the last 168 hours. No results for input(s): AMMONIA in the last 168 hours. Coagulation Profile: No results for input(s): INR, PROTIME in the last 168 hours. Cardiac Enzymes: No results for input(s): CKTOTAL, CKMB, CKMBINDEX, TROPONINI in the last 168 hours. BNP (last 3 results) No results for input(s): PROBNP in the last 8760 hours. HbA1C: No results for input(s): HGBA1C in the last 72 hours. CBG: Recent Labs  Lab 07/20/19 1616 07/20/19 2049 07/21/19 0849 07/21/19 1137 07/21/19 1704  GLUCAP 122* 103* 108* 342* 160*   Lipid Profile: No results for input(s): CHOL, HDL, LDLCALC, TRIG, CHOLHDL, LDLDIRECT in the last 72 hours. Thyroid Function Tests: No results for input(s): TSH, T4TOTAL, FREET4, T3FREE, THYROIDAB in the last 72 hours. Anemia Panel: No results for input(s): VITAMINB12, FOLATE, FERRITIN, TIBC, IRON, RETICCTPCT in the last  72 hours. Sepsis Labs: No results for input(s): PROCALCITON, LATICACIDVEN in the last 168 hours.  Recent Results (from the past 240 hour(s))  SARS CORONAVIRUS 2 (TAT 6-24 HRS) Nasopharyngeal Nasopharyngeal Swab     Status: None   Collection Time: 07/14/19 12:00 PM   Specimen: Nasopharyngeal Swab  Result Value Ref Range Status   SARS Coronavirus 2 NEGATIVE NEGATIVE Final    Comment: (NOTE) SARS-CoV-2 target nucleic acids are NOT DETECTED.  The SARS-CoV-2 RNA is generally detectable in upper and lower respiratory specimens during the acute phase of infection. Negative results do not preclude SARS-CoV-2 infection, do not rule out co-infections with other pathogens, and should not be used as the sole basis for treatment or other patient management decisions. Negative results must be  combined with clinical observations, patient history, and epidemiological information. The expected result is Negative.  Fact Sheet for Patients: SugarRoll.be  Fact Sheet for Healthcare Providers: https://www.woods-mathews.com/  This test is not yet approved or cleared by the [place] FDA and  has been authorized for detection and/or diagnosis of SARS-CoV-2 by FDA under an Emergency Use Authorization (EUA). This EUA will remain  in effect (meaning this test can be used) for the duration of the COVID-19 declaration under Se ction 564(b)(1) of the Act, 21 U.S.C. section 360bbb-3(b)(1), unless the authorization is terminated or revoked sooner.  Performed at [facility], [place] [address] [place], [place] [address]   SARS Coronavirus 2 by RT PCR (hospital order, performed in [facility] hospital lab) Nasopharyngeal Nasopharyngeal Swab     Status: None   Collection Time: 07/15/19 12:28 AM   Specimen: Nasopharyngeal Swab  Result Value Ref Range Status   SARS Coronavirus 2 NEGATIVE NEGATIVE Final    Comment: (NOTE) SARS-CoV-2 target nucleic acids are NOT  DETECTED.  The SARS-CoV-2 RNA is generally detectable in upper and lower respiratory specimens during the acute phase of infection. The lowest concentration of SARS-CoV-2 viral copies this assay can detect is 250 copies / mL. A negative result does not preclude SARS-CoV-2 infection and should not be used as the sole basis for treatment or other patient management decisions.  A negative result may occur with improper specimen collection / handling, submission of specimen other than nasopharyngeal swab, presence of viral mutation(s) within the areas targeted by this assay, and inadequate number of viral copies (<250 copies / mL). A negative result must be combined with clinical observations, patient history, and epidemiological information.  Fact Sheet for Patients:   StrictlyIdeas.no  Fact Sheet for Healthcare Providers: BankingDealers.co.za  This test is not yet approved or  cleared by the [place] FDA and has been authorized for detection and/or diagnosis of SARS-CoV-2 by FDA under an Emergency Use Authorization (EUA).  This EUA will remain in effect (meaning this test can be used) for the duration of the COVID-19 declaration under Section 564(b)(1) of the Act, 21 U.S.C. section 360bbb-3(b)(1), unless the authorization is terminated or revoked sooner.  Performed at [facility], [place] [address], [place], [place] [address]   SARS CORONAVIRUS 2 (TAT 6-24 HRS) Nasopharyngeal Nasopharyngeal Swab     Status: None   Collection Time: 07/19/19  3:24 PM   Specimen: Nasopharyngeal Swab  Result Value Ref Range Status   SARS Coronavirus 2 NEGATIVE NEGATIVE Final    Comment: (NOTE) SARS-CoV-2 target nucleic acids are NOT DETECTED.  The SARS-CoV-2 RNA is generally detectable in upper and lower respiratory specimens during the acute phase of infection. Negative results do not preclude SARS-CoV-2 infection, do not rule  out co-infections with other pathogens, and should not be used as the sole basis for treatment or other patient management decisions. Negative results must be combined with clinical observations, patient history, and epidemiological information. The expected result is Negative.  Fact Sheet for Patients: SugarRoll.be  Fact Sheet for Healthcare Providers: https://www.woods-mathews.com/  This test is not yet approved or cleared by the [place] FDA and  has been authorized for detection and/or diagnosis of SARS-CoV-2 by FDA under an Emergency Use Authorization (EUA). This EUA will remain  in effect (meaning this test can be used) for the duration of the COVID-19 declaration under Se ction 564(b)(1) of the Act, 21 U.S.C. section 360bbb-3(b)(1), unless the authorization is terminated or revoked sooner.  Performed at [facility], [place] [address] [place], [place] [address]          Radiology Studies: No results found.      Scheduled Meds: . apixaban  5 mg Oral BID  . aspirin EC  81 mg Oral Daily  . atorvastatin  20 mg Oral QHS  . budesonide (PULMICORT) nebulizer solution  0.25 mg Nebulization BID  . diltiazem  30 mg Oral Q6H  . memantine  28 mg Oral Daily   And  . donepezil  10 mg Oral QHS  . ferrous sulfate  325 mg Oral BID WC  . insulin aspart  0-9 Units Subcutaneous TID WC  . insulin glargine  10 Units Subcutaneous Daily  . ipratropium  0.5 mg Nebulization TID  . levalbuterol  0.63 mg Nebulization TID  . metoprolol tartrate  75 mg Oral BID  . modafinil  100 mg Oral Daily  . pantoprazole  40 mg Oral Daily  . perphenazine  4 mg Oral TID  . potassium chloride  10 mEq Oral BID  . QUEtiapine  100 mg Oral QHS  . sodium chloride flush  10-40 mL Intracatheter Q12H  . torsemide  40 mg Oral Daily   Continuous Infusions:    LOS: 6 days   Alma Friendly, MD [facility]   If 7PM-7AM, please contact  night-coverage [email or web address]  07/21/2019, 6:13 PM

## 2019-07-21 NOTE — Progress Notes (Addendum)
Progress Note  Patient Name: Cheryl [name] Date of Encounter: 07/21/2019  Primary Cardiologist: Kathlyn Sacramento, MD  Subjective   Pleasantly demented. Denies pain   Inpatient Medications    Scheduled Meds:  apixaban  5 mg Oral BID   aspirin EC  81 mg Oral Daily   atorvastatin  20 mg Oral QHS   budesonide (PULMICORT) nebulizer solution  0.25 mg Nebulization BID   diltiazem  30 mg Oral Q6H   memantine  28 mg Oral Daily   And   donepezil  10 mg Oral QHS   ferrous sulfate  325 mg Oral BID WC   insulin aspart  0-9 Units Subcutaneous TID WC   insulin glargine  10 Units Subcutaneous Daily   ipratropium  0.5 mg Nebulization TID   levalbuterol  0.63 mg Nebulization TID   metoprolol tartrate  50 mg Oral BID   modafinil  100 mg Oral Daily   pantoprazole  40 mg Oral Daily   perphenazine  4 mg Oral TID   potassium chloride  10 mEq Oral BID   QUEtiapine  100 mg Oral QHS   sodium chloride flush  10-40 mL Intracatheter Q12H   torsemide  40 mg Oral Daily   Continuous Infusions:  PRN Meds: ALPRAZolam, levalbuterol, nitroGLYCERIN, ondansetron **OR** ondansetron (ZOFRAN) IV, sodium chloride flush   Vital Signs    Vitals:   07/21/19 0557 07/21/19 0741 07/21/19 0743 07/21/19 0808  BP: 117/73   116/67  Pulse:  79  67  Resp: 11 14    Temp:    97.9 F (36.6 C)  TempSrc:    Oral  SpO2: 99% 97% 98%   Weight:      Height:        Intake/Output Summary (Last 24 hours) at 07/21/2019 0949 Last data filed at 07/21/2019 0600 Gross per 24 hour  Intake 120 ml  Output 2600 ml  Net -2480 ml   Filed Weights   07/16/19 0339 07/17/19 0500 07/18/19 0401  Weight: 92.5 kg 92 kg 90.4 kg    Physical Exam   General: Elderly, NAD Neck: Negative for carotid bruits. No JVD Lungs:Clear to ausculation bilaterally. No wheezes, rales, or rhonchi. Breathing is unlabored. Cardiovascular: Irregularly irregular with S1 S2. No murmurs Abdomen: Soft, non-tender, non-distended. No  obvious abdominal masses. Extremities: No edema. Radial pulses 2+ bilaterally Neuro: Alert and oriented. No focal deficits. No facial asymmetry. MAE spontaneously. Psych: Responds to questions appropriately with normal affect.    Labs    Chemistry Recent Labs  Lab 07/19/19 0600 07/20/19 0426 07/21/19 0301  NA 140 139 139  K 3.3* 3.9 3.5  CL 95* 95* 95*  CO2 33* 35* 33*  GLUCOSE 94 111* 110*  BUN 25* 30* 22  CREATININE 1.50* 1.69* 1.68*  CALCIUM 8.8* 8.9 9.5  GFRNONAA 33* 29* 29*  GFRAA 39* 33* 34*  ANIONGAP 12 9 11      Hematology Recent Labs  Lab 07/17/19 1011 07/18/19 1246 07/21/19 0301  WBC 8.8 6.2 5.4  RBC 4.28 4.23 4.51  HGB 11.7* 11.4* 12.1  HCT 39.4 38.7 40.1  MCV 92.1 91.5 88.9  MCH 27.3 27.0 26.8  MCHC 29.7* 29.5* 30.2  RDW 16.4* 15.9* 15.2  PLT 220 238 266    Cardiac EnzymesNo results for input(s): TROPONINI in the last 168 hours. No results for input(s): TROPIPOC in the last 168 hours.   BNP Recent Labs  Lab 07/15/19 0023  BNP 1,618.7*     DDimer No  results for input(s): DDIMER in the last 168 hours.   Radiology    No results found.  Telemetry    07/21/19 AF with rates in the 90's- Personally Reviewed  ECG    No new tracing as of 07/21/19- Personally Reviewed  Cardiac Studies   Echocardiogram 07/04/19:  . Left ventricular ejection fraction, by estimation, is 30 to 35%. The  left ventricle has moderately decreased function. The left ventricle  demonstrates global hypokinesis. There is mild left ventricular  hypertrophy. Left ventricular diastolic  parameters are indeterminate.  2. Right ventricular systolic function is mildly reduced. The right  ventricular size is mildly enlarged. There is mildly elevated pulmonary  artery systolic pressure.  3. Left atrial size was mildly dilated.  4. Right atrial size was moderately dilated.  5. The mitral valve is normal in structure. Trivial mitral valve  regurgitation. No evidence of  mitral stenosis.  6. Tricuspid valve regurgitation is moderate.  7. The aortic valve is normal in structure. Aortic valve regurgitation is  trivial. Mild aortic valve sclerosis is present, with no evidence of  aortic valve stenosis.  8. Moderately dilated pulmonary artery.   Patient Profile     79 y.o. female  with past medical history of diabetes, hypertension, hyperlipidemia, COPD, lung cancer, dementia, schizophrenia, atrial flutter for evaluation of atrial flutter with rapid ventricular response and CHF. Patient recently discharged from [facility] following admission for COPD. She did have atrial flutter at that time. She received amiodarone and Toprol but apparently had a 5-second posttermination pause. She was seen by Dr. Caryl Comes and rate control was recommended given her overall medical condition. She was discharged on Toprol 50 mg daily and Xarelto. Apparently she developed respiratory distress and was brought to [facility]. She was treated with diuretics with some improvement. She is noted to be in atrial flutter and cardiology asked to evaluate. Recent echocardiogram showed ejection fraction 30 to 35%, mild left ventricular hypertrophy, mild right ventricular enlargement, biatrial enlargement, moderate tricuspid regurgitation.   Assessment & Plan    1 Atrial flutter: -Plan per Dr. Stanford Breed was for rate control with beta-blocker at this time.  -Metoprolol increased to 50 mg twice daily and Cardizem was decreased to 30 mg every 6 hours.  -Will increase metoprolol to 75mg  PO BID given HRs in the 90's>>plan to wean diltiazem off   -Once metoprolol dose stable will transition to Toprol.  -Continue Eliquis>>Hb stable   2. Acute on chronic systolic congestive heart failure: -Appears to be euvolemic on examination today -Lasix changed to Torsemide 40 mg twice daily yesterday>>tolerating well  -Follow renal function closely>>creatinine stable from yesterday -Weight, 199lb  07/18/19>>down from 202lb 07/17/19 -I&O, net negative 6.7L -Creatinine up at 1.68 with a baseline of 1.4-1.5  3. Cardiomyopathy, unclear etiology>>ischemic versus tachycardia mediated:  -Metoprolol added to regimen with plans to transition to Toprol once at a stable dose  -Will add losartan as outpt if BP and renal function allow  -Not felt to be a candidate for invasive ischemic workup  4. Acute on chronic stage III kidney disease: -Creatinine, 1.68 today>>>stable from yesterday at 1.69 -Continue to follow renal function closely  5. Dementia/schizophrenia/mental retardation: -As outlined previously it would be worthwhile to continue efforts to contact patient's family for discussions concerning aggressiveness of care   Signed, Kathyrn Drown NP-C [facility] Pager: [phone] 07/21/2019, 9:49 AM     For questions or updates, please contact   Please consult [email or web address] for contact info under Cardiology/STEMI.  Attending Note:  The patient was seen and examined.  Agree with assessment and plan as noted above.  Changes made to the above note as needed.  Patient seen and independently examined with  Kathyrn Drown, NP .   We discussed all aspects of the encounter. I agree with the assessment and plan as stated above.  1.   Atrial flutter:   Continue rate control with metoprolol and anticoagulation with  Eliquis.  2.  Acute on chronic CHF :   Currently on torsemide.  Has had good diuresis.  3.  CKD:  Stable   4.   Dementia / mental retardation:   Stable     I have spent a total of 40 minutes with patient reviewing hospital  notes , telemetry, EKGs, labs and examining patient as well as establishing an assessment and plan that was discussed with the patient. > 50% of time was spent in direct patient care.  No further cardiology recs  [place] will sign off.   Medication Recommendations:  Cont current mets  Other recommendations (labs, testing, etc):   Follow up as  an outpatient:  With Dr. Fletcher Anon and with primary MD    Thayer Headings, Brooke Bonito., MD, [facility] 07/21/2019, 3:05 PM [address],  [place] Pager [phone]

## 2019-07-21 NOTE — Progress Notes (Signed)
Pt placed  BIPAP per order on 16/8, BUR 10, 21%. Pt respiratory status is stable at this time. RT will continue to monitor.

## 2019-07-22 LAB — BASIC METABOLIC PANEL
Anion gap: 10 (ref 5–15)
Anion gap: 9 (ref 5–15)
BUN: 34 mg/dL — ABNORMAL HIGH (ref 8–23)
BUN: 32 mg/dL — ABNORMAL HIGH (ref 8–23)
CO2: 31 mmol/L (ref 22–32)
CO2: 34 mmol/L — ABNORMAL HIGH (ref 22–32)
Calcium: 8.8 mg/dL — ABNORMAL LOW (ref 8.9–10.3)
Calcium: 8.8 mg/dL — ABNORMAL LOW (ref 8.9–10.3)
Chloride: 96 mmol/L — ABNORMAL LOW (ref 98–111)
Chloride: 98 mmol/L (ref 98–111)
Creatinine, Ser: 2.05 mg/dL — ABNORMAL HIGH (ref 0.44–1.00)
Creatinine, Ser: 1.98 mg/dL — ABNORMAL HIGH (ref 0.44–1.00)
GFR calc Af Amer: 26 mL/min — ABNORMAL LOW (ref >60)
GFR calc Af Amer: 28 mL/min — ABNORMAL LOW (ref >60)
GFR calc non Af Amer: 23 mL/min — ABNORMAL LOW (ref >60)
GFR calc non Af Amer: 24 mL/min — ABNORMAL LOW (ref >60)
Glucose, Bld: 125 mg/dL — ABNORMAL HIGH (ref 70–99)
Glucose, Bld: 113 mg/dL — ABNORMAL HIGH (ref 70–99)
Potassium: 6.2 mmol/L — ABNORMAL HIGH (ref 3.5–5.1)
Potassium: 3.7 mmol/L (ref 3.5–5.1)
Sodium: 137 mmol/L (ref 135–145)
Sodium: 141 mmol/L (ref 135–145)

## 2019-07-22 LAB — BLOOD GAS, ARTERIAL
Acid-Base Excess: 9.6 mmol/L — ABNORMAL HIGH (ref 0.0–2.0)
Bicarbonate: 34.1 mmol/L — ABNORMAL HIGH (ref 20.0–28.0)
Drawn by: 28099
FIO2: 21
O2 Saturation: 95.6 %
Patient temperature: 36.5
pCO2 arterial: 49.2 mmHg — ABNORMAL HIGH (ref 32.0–48.0)
pH, Arterial: 7.453 — ABNORMAL HIGH (ref 7.350–7.450)
pO2, Arterial: 75.1 mmHg — ABNORMAL LOW (ref 83.0–108.0)

## 2019-07-22 LAB — GLUCOSE, CAPILLARY
Glucose-Capillary: 103 mg/dL — ABNORMAL HIGH (ref 70–99)
Glucose-Capillary: 101 mg/dL — ABNORMAL HIGH (ref 70–99)
Glucose-Capillary: 167 mg/dL — ABNORMAL HIGH (ref 70–99)
Glucose-Capillary: 133 mg/dL — ABNORMAL HIGH (ref 70–99)

## 2019-07-22 NOTE — Progress Notes (Signed)
PROGRESS NOTE    Cheryl [name]  XQJ:[id number] DOB: [birth year] DOA: 07/14/2019 PCP: Letta Median, MD   Brief Narrative: 79 year old COPD, Systolic Heart EF 44-81 %, schizophrenia, CKD, recently discharge from [facility] treated for A flutter, COPD HF exacerbation, presents with worsening SOB, Respiratory failure. Patient admitted with acute hypoxic hypercapnic respiratory failure secondary to heart failure exacerbation, new ejection fraction at 35%.  She was treated with IV Lasix and was placed on BiPAP.  Her respiratory status subsequently improved.  Patient developed chest pain,  was transitioned to IV heparin from Eliquis.  Cardiology is not planning future intervention   Assessment & Plan:   Principal Problem:   Acute respiratory failure with hypoxia (HCC) Active Problems:   COPD exacerbation (HCC)   HTN (hypertension)   Dementia (HCC)   Persistent atrial fibrillation (HCC)   Acute on chronic systolic CHF (congestive heart failure) (HCC)   Acute hypoxic/hypercapneic respiratory failure Acute on chronic systolic heart failure exacerbation ejection fraction 35% Currently transitioned to room air Cardiology on board, IV Lasix, changed p.o. Lasix to torsemide, currently held Continue nebs treatments Continue with supplemental oxygen as needed, BIPAP at HS and PRN  A flutter  RVR TSH normal Heart rate controlled Continue oral Cardizem, metoprolol, Eliquis Noted 5 second pause while on amiodarone and toprol Cardiology on board, appreciate recs  Chest pain, epigastric pain EKG changes. ST depression anterior lead.  Troponin trending downwards Cardiology on board, no further intervention  Diabetes mellitus type 2 SSI, Lantus, Accu-Cheks, hypoglycemic protocol  AKI on CKD stage IIIb Worsening Baseline creatinine around 1.3  Hold diuretic, torsemide Daily BMP   Hyperlipidemia Continue with statins.   History of schizophrenia Continue with xanax as needed and  decreased seroquel  History of dementia She is more alert. Oriented to person only [facility] was contacted ([phone]), patient has developmental delay and she doesn't speak a lot, she will repeat what is said to her  History of lung cancer  S./p radiation     Estimated body mass index is 33.45 kg/m as calculated from the following:   Height as of this encounter: 5\' 4"  (1.626 m).   Weight as of this encounter: 88.4 kg.   DVT prophylaxis: Eliquis Code Status: Full code Family Communication: Son updated over phone7/6 Disposition Plan:  Status is: Inpatient  Remains inpatient appropriate because:Hemodynamically unstable, Persistent severe electrolyte disturbances and Inpatient level of care appropriate due to severity of illness   Dispo: The patient is from: ALF              Anticipated d/c is to: SNF              Anticipated d/c date is: 1 day              Patient currently is not medically stable to d/c.     Consultants:   CCM  Cardiology   Procedures:   None  Antimicrobials:  None   Subjective: Patient seen and examined at bedside. Noted to be more sleepy today, denied any new complaints (although speech not clear)  Objective: Vitals:   07/22/19 0750 07/22/19 1204 07/22/19 1434 07/22/19 1620  BP: (!) 135/91 118/65  116/64  Pulse: 66 64 64 64  Resp: 10  18 12   Temp: 97.6 F (36.4 C) 97.6 F (36.4 C)  97.7 F (36.5 C)  TempSrc:      SpO2: 100% 100% 95% 98%  Weight:      Height:  Intake/Output Summary (Last 24 hours) at 07/22/2019 1757 Last data filed at 07/22/2019 1242 Gross per 24 hour  Intake 240 ml  Output 1350 ml  Net -1110 ml   Filed Weights   07/17/19 0500 07/18/19 0401 07/22/19 0541  Weight: 92 kg 90.4 kg 88.4 kg    Examination:   General: NAD, stutters, somewhat lethargic    Cardiovascular: S1, S2 present  Respiratory: CTAB  Abdomen: Soft, nontender, nondistended, bowel sounds present  Musculoskeletal: No  bilateral pedal edema noted  Skin: Normal  Psychiatry: Normal mood     Data Reviewed: I have personally reviewed following labs and imaging studies  CBC: Recent Labs  Lab 07/17/19 1011 07/18/19 1246 07/21/19 0301  WBC 8.8 6.2 5.4  NEUTROABS  --   --  2.7  HGB 11.7* 11.4* 12.1  HCT 39.4 38.7 40.1  MCV 92.1 91.5 88.9  PLT 220 238 124   Basic Metabolic Panel: Recent Labs  Lab 07/19/19 0600 07/20/19 0426 07/21/19 0301 07/22/19 0328 07/22/19 0750  NA 140 139 139 137 141  K 3.3* 3.9 3.5 6.2* 3.7  CL 95* 95* 95* 96* 98  CO2 33* 35* 33* 31 34*  GLUCOSE 94 111* 110* 125* 113*  BUN 25* 30* 22 34* 32*  CREATININE 1.50* 1.69* 1.68* 2.05* 1.98*  CALCIUM 8.8* 8.9 9.5 8.8* 8.8*   GFR: Estimated Creatinine Clearance: 25.6 mL/min (A) (by C-G formula based on SCr of 1.98 mg/dL (H)). Liver Function Tests: No results for input(s): AST, ALT, ALKPHOS, BILITOT, PROT, ALBUMIN in the last 168 hours. No results for input(s): LIPASE, AMYLASE in the last 168 hours. No results for input(s): AMMONIA in the last 168 hours. Coagulation Profile: No results for input(s): INR, PROTIME in the last 168 hours. Cardiac Enzymes: No results for input(s): CKTOTAL, CKMB, CKMBINDEX, TROPONINI in the last 168 hours. BNP (last 3 results) No results for input(s): PROBNP in the last 8760 hours. HbA1C: No results for input(s): HGBA1C in the last 72 hours. CBG: Recent Labs  Lab 07/21/19 1137 07/21/19 1704 07/22/19 0752 07/22/19 1204 07/22/19 1621  GLUCAP 342* 160* 103* 101* 167*   Lipid Profile: No results for input(s): CHOL, HDL, LDLCALC, TRIG, CHOLHDL, LDLDIRECT in the last 72 hours. Thyroid Function Tests: No results for input(s): TSH, T4TOTAL, FREET4, T3FREE, THYROIDAB in the last 72 hours. Anemia Panel: No results for input(s): VITAMINB12, FOLATE, FERRITIN, TIBC, IRON, RETICCTPCT in the last 72 hours. Sepsis Labs: No results for input(s): PROCALCITON, LATICACIDVEN in the last 168  hours.  Recent Results (from the past 240 hour(s))  SARS CORONAVIRUS 2 (TAT 6-24 HRS) Nasopharyngeal Nasopharyngeal Swab     Status: None   Collection Time: 07/14/19 12:00 PM   Specimen: Nasopharyngeal Swab  Result Value Ref Range Status   SARS Coronavirus 2 NEGATIVE NEGATIVE Final    Comment: (NOTE) SARS-CoV-2 target nucleic acids are NOT DETECTED.  The SARS-CoV-2 RNA is generally detectable in upper and lower respiratory specimens during the acute phase of infection. Negative results do not preclude SARS-CoV-2 infection, do not rule out co-infections with other pathogens, and should not be used as the sole basis for treatment or other patient management decisions. Negative results must be combined with clinical observations, patient history, and epidemiological information. The expected result is Negative.  Fact Sheet for Patients: SugarRoll.be  Fact Sheet for Healthcare Providers: https://www.woods-mathews.com/  This test is not yet approved or cleared by the [place] FDA and  has been authorized for detection and/or diagnosis of SARS-CoV-2 by FDA  under an Emergency Use Authorization (EUA). This EUA will remain  in effect (meaning this test can be used) for the duration of the COVID-19 declaration under Se ction 564(b)(1) of the Act, 21 U.S.C. section 360bbb-3(b)(1), unless the authorization is terminated or revoked sooner.  Performed at [facility], [place] [address], [place], [place] [address]   SARS Coronavirus 2 by RT PCR (hospital order, performed in [facility] hospital lab) Nasopharyngeal Nasopharyngeal Swab     Status: None   Collection Time: 07/15/19 12:28 AM   Specimen: Nasopharyngeal Swab  Result Value Ref Range Status   SARS Coronavirus 2 NEGATIVE NEGATIVE Final    Comment: (NOTE) SARS-CoV-2 target nucleic acids are NOT DETECTED.  The SARS-CoV-2 RNA is generally detectable in upper and lower respiratory  specimens during the acute phase of infection. The lowest concentration of SARS-CoV-2 viral copies this assay can detect is 250 copies / mL. A negative result does not preclude SARS-CoV-2 infection and should not be used as the sole basis for treatment or other patient management decisions.  A negative result may occur with improper specimen collection / handling, submission of specimen other than nasopharyngeal swab, presence of viral mutation(s) within the areas targeted by this assay, and inadequate number of viral copies (<250 copies / mL). A negative result must be combined with clinical observations, patient history, and epidemiological information.  Fact Sheet for Patients:   StrictlyIdeas.no  Fact Sheet for Healthcare Providers: BankingDealers.co.za  This test is not yet approved or  cleared by the [place] FDA and has been authorized for detection and/or diagnosis of SARS-CoV-2 by FDA under an Emergency Use Authorization (EUA).  This EUA will remain in effect (meaning this test can be used) for the duration of the COVID-19 declaration under Section 564(b)(1) of the Act, 21 U.S.C. section 360bbb-3(b)(1), unless the authorization is terminated or revoked sooner.  Performed at [facility], [place] [address], [place], [place] [address]   SARS CORONAVIRUS 2 (TAT 6-24 HRS) Nasopharyngeal Nasopharyngeal Swab     Status: None   Collection Time: 07/19/19  3:24 PM   Specimen: Nasopharyngeal Swab  Result Value Ref Range Status   SARS Coronavirus 2 NEGATIVE NEGATIVE Final    Comment: (NOTE) SARS-CoV-2 target nucleic acids are NOT DETECTED.  The SARS-CoV-2 RNA is generally detectable in upper and lower respiratory specimens during the acute phase of infection. Negative results do not preclude SARS-CoV-2 infection, do not rule out co-infections with other pathogens, and should not be used as the sole basis for treatment or  other patient management decisions. Negative results must be combined with clinical observations, patient history, and epidemiological information. The expected result is Negative.  Fact Sheet for Patients: SugarRoll.be  Fact Sheet for Healthcare Providers: https://www.woods-mathews.com/  This test is not yet approved or cleared by the [place] FDA and  has been authorized for detection and/or diagnosis of SARS-CoV-2 by FDA under an Emergency Use Authorization (EUA). This EUA will remain  in effect (meaning this test can be used) for the duration of the COVID-19 declaration under Se ction 564(b)(1) of the Act, 21 U.S.C. section 360bbb-3(b)(1), unless the authorization is terminated or revoked sooner.  Performed at [facility], [place] [address] [place], [place] [address]          Radiology Studies: No results found.      Scheduled Meds: . apixaban  5 mg Oral BID  . aspirin EC  81 mg Oral Daily  . atorvastatin  20 mg Oral QHS  . budesonide (PULMICORT) nebulizer solution  0.25 mg Nebulization BID  . diltiazem  30 mg Oral Q6H  . memantine  28 mg Oral Daily   And  . donepezil  10 mg Oral QHS  . ferrous sulfate  325 mg Oral BID WC  . insulin aspart  0-9 Units Subcutaneous TID WC  . insulin glargine  10 Units Subcutaneous Daily  . ipratropium  0.5 mg Nebulization TID  . levalbuterol  0.63 mg Nebulization TID  . metoprolol tartrate  75 mg Oral BID  . modafinil  100 mg Oral Daily  . pantoprazole  40 mg Oral Daily  . perphenazine  4 mg Oral TID  . QUEtiapine  100 mg Oral QHS  . sodium chloride flush  10-40 mL Intracatheter Q12H   Continuous Infusions:    LOS: 7 days   Alma Friendly, MD [facility]   If 7PM-7AM, please contact night-coverage [email or web address]  07/22/2019, 5:57 PM

## 2019-07-23 LAB — GLUCOSE, CAPILLARY
Glucose-Capillary: 97 mg/dL (ref 70–99)
Glucose-Capillary: 204 mg/dL — ABNORMAL HIGH (ref 70–99)
Glucose-Capillary: 108 mg/dL — ABNORMAL HIGH (ref 70–99)
Glucose-Capillary: 75 mg/dL (ref 70–99)

## 2019-07-23 LAB — BASIC METABOLIC PANEL
Anion gap: 11 (ref 5–15)
BUN: 29 mg/dL — ABNORMAL HIGH (ref 8–23)
CO2: 27 mmol/L (ref 22–32)
Calcium: 9.1 mg/dL (ref 8.9–10.3)
Chloride: 104 mmol/L (ref 98–111)
Creatinine, Ser: 1.61 mg/dL — ABNORMAL HIGH (ref 0.44–1.00)
GFR calc Af Amer: 35 mL/min — ABNORMAL LOW (ref >60)
GFR calc non Af Amer: 31 mL/min — ABNORMAL LOW (ref >60)
Glucose, Bld: 92 mg/dL (ref 70–99)
Potassium: 3.8 mmol/L (ref 3.5–5.1)
Sodium: 142 mmol/L (ref 135–145)

## 2019-07-23 MED ORDER — IPRATROPIUM BROMIDE 0.02 % IN SOLN: 0.5000 mg | Freq: Four times a day (QID) | RESPIRATORY_TRACT | Status: DC | PRN

## 2019-07-23 NOTE — TOC Progression Note (Signed)
Transition of Care [facility]) - Progression Note    Patient Details  Name: Addilee Neu MRN: [id number] Date of Birth: Jul 14, 1940  Transition of Care [facility]) CM/SW [place], [place] Phone Number: 07/23/2019, 2:26 PM  Clinical Narrative:    CSW spoke with admission coordinator for [facility].  Pt's bipap machine will not be at the facility until Tuesday.   Expected Discharge Plan: Skilled Nursing Facility Barriers to Discharge: Continued Medical Work up  Expected Discharge Plan and Services Expected Discharge Plan: [place]       Living arrangements for the past 2 months: [place]                                       Social Determinants of Health (SDOH) Interventions    Readmission Risk Interventions Readmission Risk Prevention Plan 05/10/2019  Transportation Screening Complete  PCP or Specialist Appt within 3-5 Days Complete  HRI or Home Care Consult Complete  Medication Review (RN Care Manager) Complete  Some recent data might be hidden

## 2019-07-23 NOTE — Progress Notes (Signed)
PROGRESS NOTE    Cheryl [name]  HWE:[id number] DOB: January 23, 1940 DOA: 07/14/2019 PCP: Letta Median, MD   Brief Narrative: 79 year old COPD, Systolic Heart EF 89-38 %, schizophrenia, CKD, recently discharge from [place] treated for A flutter, COPD HF exacerbation, presents with worsening SOB, Respiratory failure. Patient admitted with acute hypoxic hypercapnic respiratory failure secondary to heart failure exacerbation, new ejection fraction at 35%.  She was treated with IV Lasix and was placed on BiPAP.  Her respiratory status subsequently improved.  Patient developed chest pain,  was transitioned to IV heparin from Eliquis.  Cardiology is not planning future intervention   Assessment & Plan:   Principal Problem:   Acute respiratory failure with hypoxia (HCC) Active Problems:   COPD exacerbation (HCC)   HTN (hypertension)   Dementia (HCC)   Persistent atrial fibrillation (HCC)   Acute on chronic systolic CHF (congestive heart failure) (HCC)   Acute hypoxic/hypercapneic respiratory failure Acute on chronic systolic heart failure exacerbation ejection fraction 35% Currently transitioned to room air Cardiology on board, IV Lasix, changed p.o. Lasix to torsemide, currently held (after discussing with cardiology Dr. Acie Fredrickson on 07/22/2019) Continue nebs treatments Continue with supplemental oxygen as needed, BIPAP at HS and PRN  A flutter  RVR TSH normal Heart rate controlled Continue oral Cardizem, metoprolol, Eliquis Noted 5 second pause while on amiodarone and toprol Cardiology on board, appreciate recs  Chest pain, epigastric pain EKG changes. ST depression anterior lead.  Troponin trending downwards Cardiology on board, no further intervention  Diabetes mellitus type 2 SSI, Lantus, Accu-Cheks, hypoglycemic protocol  AKI on CKD stage IIIb Worsening Baseline creatinine around 1.3  Hold diuretic, torsemide Daily BMP   Hyperlipidemia Continue with statins.    History of schizophrenia Continue with xanax as needed and decreased seroquel  History of dementia She is more alert. Oriented to person only [facility] was contacted ([phone]), patient has developmental delay and she doesn't speak a lot, she will repeat what is said to her  History of lung cancer  S./p radiation     Estimated body mass index is 33.49 kg/m as calculated from the following:   Height as of this encounter: 5\' 4"  (1.626 m).   Weight as of this encounter: 88.5 kg.   DVT prophylaxis: Eliquis Code Status: Full code Family Communication: Son updated over phone7/6 Disposition Plan:  Status is: Inpatient  Remains inpatient appropriate because:Hemodynamically unstable, Persistent severe electrolyte disturbances and Inpatient level of care appropriate due to severity of illness   Dispo: The patient is from: ALF              Anticipated d/c is to: SNF              Anticipated d/c date is: 3 days              Patient currently is medically stable to d/c.  Patient's BiPAP will be ready for patient at SNF on 07/26/19    Consultants:   CCM  Cardiology   Procedures:   None  Antimicrobials:  None   Subjective: Patient seen and examined at bedside.  Denies any new complaints.  Objective: Vitals:   07/23/19 0500 07/23/19 0724 07/23/19 0736 07/23/19 1627  BP:   (!) 156/90 129/63  Pulse:   65 (!) 43  Resp:   20 17  Temp:   98.6 F (37 C) 98.6 F (37 C)  TempSrc:   Oral Oral  SpO2:  97% 98% 97%  Weight: 88.5 kg  Height:       No intake or output data in the 24 hours ending 07/23/19 1903 Filed Weights   07/18/19 0401 07/22/19 0541 07/23/19 0500  Weight: 90.4 kg 88.4 kg 88.5 kg    Examination:   General: NAD, stutters  Cardiovascular: S1, S2 present  Respiratory: CTAB  Abdomen: Soft, nontender, nondistended, bowel sounds present  Musculoskeletal: No bilateral pedal edema noted  Skin: Normal  Psychiatry: Normal  mood     Data Reviewed: I have personally reviewed following labs and imaging studies  CBC: Recent Labs  Lab 07/17/19 1011 07/18/19 1246 07/21/19 0301  WBC 8.8 6.2 5.4  NEUTROABS  --   --  2.7  HGB 11.7* 11.4* 12.1  HCT 39.4 38.7 40.1  MCV 92.1 91.5 88.9  PLT 220 238 762   Basic Metabolic Panel: Recent Labs  Lab 07/20/19 0426 07/21/19 0301 07/22/19 0328 07/22/19 0750 07/23/19 0336  NA 139 139 137 141 142  K 3.9 3.5 6.2* 3.7 3.8  CL 95* 95* 96* 98 104  CO2 35* 33* 31 34* 27  GLUCOSE 111* 110* 125* 113* 92  BUN 30* 22 34* 32* 29*  CREATININE 1.69* 1.68* 2.05* 1.98* 1.61*  CALCIUM 8.9 9.5 8.8* 8.8* 9.1   GFR: Estimated Creatinine Clearance: 31.5 mL/min (A) (by C-G formula based on SCr of 1.61 mg/dL (H)). Liver Function Tests: No results for input(s): AST, ALT, ALKPHOS, BILITOT, PROT, ALBUMIN in the last 168 hours. No results for input(s): LIPASE, AMYLASE in the last 168 hours. No results for input(s): AMMONIA in the last 168 hours. Coagulation Profile: No results for input(s): INR, PROTIME in the last 168 hours. Cardiac Enzymes: No results for input(s): CKTOTAL, CKMB, CKMBINDEX, TROPONINI in the last 168 hours. BNP (last 3 results) No results for input(s): PROBNP in the last 8760 hours. HbA1C: No results for input(s): HGBA1C in the last 72 hours. CBG: Recent Labs  Lab 07/22/19 1621 07/22/19 2114 07/23/19 0733 07/23/19 1153 07/23/19 1626  GLUCAP 167* 133* 97 204* 108*   Lipid Profile: No results for input(s): CHOL, HDL, LDLCALC, TRIG, CHOLHDL, LDLDIRECT in the last 72 hours. Thyroid Function Tests: No results for input(s): TSH, T4TOTAL, FREET4, T3FREE, THYROIDAB in the last 72 hours. Anemia Panel: No results for input(s): VITAMINB12, FOLATE, FERRITIN, TIBC, IRON, RETICCTPCT in the last 72 hours. Sepsis Labs: No results for input(s): PROCALCITON, LATICACIDVEN in the last 168 hours.  Recent Results (from the past 240 hour(s))  SARS CORONAVIRUS 2 (TAT  6-24 HRS) Nasopharyngeal Nasopharyngeal Swab     Status: None   Collection Time: 07/14/19 12:00 PM   Specimen: Nasopharyngeal Swab  Result Value Ref Range Status   SARS Coronavirus 2 NEGATIVE NEGATIVE Final    Comment: (NOTE) SARS-CoV-2 target nucleic acids are NOT DETECTED.  The SARS-CoV-2 RNA is generally detectable in upper and lower respiratory specimens during the acute phase of infection. Negative results do not preclude SARS-CoV-2 infection, do not rule out co-infections with other pathogens, and should not be used as the sole basis for treatment or other patient management decisions. Negative results must be combined with clinical observations, patient history, and epidemiological information. The expected result is Negative.  Fact Sheet for Patients: SugarRoll.be  Fact Sheet for Healthcare Providers: https://www.woods-mathews.com/  This test is not yet approved or cleared by the [place] FDA and  has been authorized for detection and/or diagnosis of SARS-CoV-2 by FDA under an Emergency Use Authorization (EUA). This EUA will remain  in effect (meaning this test can  be used) for the duration of the COVID-19 declaration under Se ction 564(b)(1) of the Act, 21 U.S.C. section 360bbb-3(b)(1), unless the authorization is terminated or revoked sooner.  Performed at [facility], [place] [address] [place], [place] [address]   SARS Coronavirus 2 by RT PCR (hospital order, performed in [facility] hospital lab) Nasopharyngeal Nasopharyngeal Swab     Status: None   Collection Time: 07/15/19 12:28 AM   Specimen: Nasopharyngeal Swab  Result Value Ref Range Status   SARS Coronavirus 2 NEGATIVE NEGATIVE Final    Comment: (NOTE) SARS-CoV-2 target nucleic acids are NOT DETECTED.  The SARS-CoV-2 RNA is generally detectable in upper and lower respiratory specimens during the acute phase of infection. The lowest concentration of  SARS-CoV-2 viral copies this assay can detect is 250 copies / mL. A negative result does not preclude SARS-CoV-2 infection and should not be used as the sole basis for treatment or other patient management decisions.  A negative result may occur with improper specimen collection / handling, submission of specimen other than nasopharyngeal swab, presence of viral mutation(s) within the areas targeted by this assay, and inadequate number of viral copies (<250 copies / mL). A negative result must be combined with clinical observations, patient history, and epidemiological information.  Fact Sheet for Patients:   StrictlyIdeas.no  Fact Sheet for Healthcare Providers: BankingDealers.co.za  This test is not yet approved or  cleared by the [place] FDA and has been authorized for detection and/or diagnosis of SARS-CoV-2 by FDA under an Emergency Use Authorization (EUA).  This EUA will remain in effect (meaning this test can be used) for the duration of the COVID-19 declaration under Section 564(b)(1) of the Act, 21 U.S.C. section 360bbb-3(b)(1), unless the authorization is terminated or revoked sooner.  Performed at [facility], [place] [address] [place], [place] [address]   SARS CORONAVIRUS 2 (TAT 6-24 HRS) Nasopharyngeal Nasopharyngeal Swab     Status: None   Collection Time: 07/19/19  3:24 PM   Specimen: Nasopharyngeal Swab  Result Value Ref Range Status   SARS Coronavirus 2 NEGATIVE NEGATIVE Final    Comment: (NOTE) SARS-CoV-2 target nucleic acids are NOT DETECTED.  The SARS-CoV-2 RNA is generally detectable in upper and lower respiratory specimens during the acute phase of infection. Negative results do not preclude SARS-CoV-2 infection, do not rule out co-infections with other pathogens, and should not be used as the sole basis for treatment or other patient management decisions. Negative results must be combined with  clinical observations, patient history, and epidemiological information. The expected result is Negative.  Fact Sheet for Patients: SugarRoll.be  Fact Sheet for Healthcare Providers: https://www.woods-mathews.com/  This test is not yet approved or cleared by the [place] FDA and  has been authorized for detection and/or diagnosis of SARS-CoV-2 by FDA under an Emergency Use Authorization (EUA). This EUA will remain  in effect (meaning this test can be used) for the duration of the COVID-19 declaration under Se ction 564(b)(1) of the Act, 21 U.S.C. section 360bbb-3(b)(1), unless the authorization is terminated or revoked sooner.  Performed at [facility], [place] [address] [place], [place] [address]          Radiology Studies: No results found.      Scheduled Meds: . apixaban  5 mg Oral BID  . aspirin EC  81 mg Oral Daily  . atorvastatin  20 mg Oral QHS  . budesonide (PULMICORT) nebulizer solution  0.25 mg Nebulization BID  .  diltiazem  30 mg Oral Q6H  . memantine  28 mg Oral Daily   And  . donepezil  10 mg Oral QHS  . ferrous sulfate  325 mg Oral BID WC  . insulin aspart  0-9 Units Subcutaneous TID WC  . insulin glargine  10 Units Subcutaneous Daily  . metoprolol tartrate  75 mg Oral BID  . modafinil  100 mg Oral Daily  . pantoprazole  40 mg Oral Daily  . perphenazine  4 mg Oral TID  . QUEtiapine  100 mg Oral QHS  . sodium chloride flush  10-40 mL Intracatheter Q12H   Continuous Infusions:    LOS: 8 days   Alma Friendly, MD [facility]   If 7PM-7AM, please contact night-coverage [email or web address]  07/23/2019, 7:03 PM

## 2019-07-24 LAB — BASIC METABOLIC PANEL
Anion gap: 10 (ref 5–15)
BUN: 29 mg/dL — ABNORMAL HIGH (ref 8–23)
CO2: 30 mmol/L (ref 22–32)
Calcium: 9.2 mg/dL (ref 8.9–10.3)
Chloride: 99 mmol/L (ref 98–111)
Creatinine, Ser: 1.62 mg/dL — ABNORMAL HIGH (ref 0.44–1.00)
GFR calc Af Amer: 35 mL/min — ABNORMAL LOW (ref >60)
GFR calc non Af Amer: 30 mL/min — ABNORMAL LOW (ref >60)
Glucose, Bld: 160 mg/dL — ABNORMAL HIGH (ref 70–99)
Potassium: 3.8 mmol/L (ref 3.5–5.1)
Sodium: 139 mmol/L (ref 135–145)

## 2019-07-24 LAB — GLUCOSE, CAPILLARY
Glucose-Capillary: 95 mg/dL (ref 70–99)
Glucose-Capillary: 198 mg/dL — ABNORMAL HIGH (ref 70–99)
Glucose-Capillary: 153 mg/dL — ABNORMAL HIGH (ref 70–99)
Glucose-Capillary: 182 mg/dL — ABNORMAL HIGH (ref 70–99)

## 2019-07-24 NOTE — Progress Notes (Signed)
PROGRESS NOTE    Cheryl [name]  IWP:[id number] DOB: [birth year] DOA: 07/14/2019 PCP: Letta Median, MD   Brief Narrative: 79 year old COPD, Systolic Heart EF 50-53 %, schizophrenia, CKD, recently discharge from [place] treated for A flutter, COPD HF exacerbation, presents with worsening SOB, Respiratory failure. Patient admitted with acute hypoxic hypercapnic respiratory failure secondary to heart failure exacerbation, new ejection fraction at 35%.  She was treated with IV Lasix and was placed on BiPAP.  Her respiratory status subsequently improved.  Patient developed chest pain,  was transitioned to IV heparin from Eliquis.  Cardiology is not planning future intervention   Assessment & Plan:   Principal Problem:   Acute respiratory failure with hypoxia (HCC) Active Problems:   COPD exacerbation (HCC)   HTN (hypertension)   Dementia (HCC)   Persistent atrial fibrillation (HCC)   Acute on chronic systolic CHF (congestive heart failure) (HCC)   Acute hypoxic/hypercapneic respiratory failure Acute on chronic systolic heart failure exacerbation ejection fraction 35% Currently transitioned to room air Cardiology on board, IV Lasix, changed p.o. Lasix to torsemide, currently held (after discussing with cardiology Dr. Acie Fredrickson on 07/22/2019) Continue nebs treatments Continue with supplemental oxygen as needed, BIPAP at HS and PRN  A flutter  RVR TSH normal Heart rate controlled Continue oral Cardizem, metoprolol, Eliquis Noted 5 second pause while on amiodarone and toprol Cardiology on board, appreciate recs  Chest pain, epigastric pain EKG changes. ST depression anterior lead.  Troponin trending downwards Cardiology on board, no further intervention  Diabetes mellitus type 2 SSI, Lantus, Accu-Cheks, hypoglycemic protocol  AKI on CKD stage IIIb Worsening Baseline creatinine around 1.3  Hold torsemide Daily BMP   Hyperlipidemia Continue with statins.   History of  schizophrenia Continue with xanax as needed and decreased seroquel  History of dementia She is more alert. Oriented to person only [facility] was contacted ([phone]), patient has developmental delay and she doesn't speak a lot, she will repeat what is said to her  History of lung cancer  S./p radiation     Estimated body mass index is 33.41 kg/m as calculated from the following:   Height as of this encounter: 5\' 4"  (1.626 m).   Weight as of this encounter: 88.3 kg.   DVT prophylaxis: Eliquis Code Status: Full code Family Communication: Son updated over phone7/6 Disposition Plan:  Status is: Inpatient  Remains inpatient appropriate because:Hemodynamically unstable, Persistent severe electrolyte disturbances and Inpatient level of care appropriate due to severity of illness   Dispo: The patient is from: ALF              Anticipated d/c is to: SNF              Anticipated d/c date is: 2 days              Patient currently is medically stable to d/c.  Patient's BiPAP will be ready for patient at SNF on 07/26/19    Consultants:   CCM  Cardiology   Procedures:   None  Antimicrobials:  None   Subjective: Patient seen and examined at bedside.  Denies any new complaints, looks comfortable.  Objective: Vitals:   07/24/19 0500 07/24/19 0729 07/24/19 0922 07/24/19 1629  BP:   137/81 121/66  Pulse:   66 61  Resp:      Temp:      TempSrc:      SpO2:  99%  98%  Weight: 88.3 kg     Height:  No intake or output data in the 24 hours ending 07/24/19 1747 Filed Weights   07/22/19 0541 07/23/19 0500 07/24/19 0500  Weight: 88.4 kg 88.5 kg 88.3 kg    Examination:   General: NAD, stutters  Cardiovascular: S1, S2 present  Respiratory: CTAB  Abdomen: Soft, nontender, nondistended, bowel sounds present  Musculoskeletal: No bilateral pedal edema noted  Skin: Normal  Psychiatry: Normal mood     Data Reviewed: I have personally reviewed  following labs and imaging studies  CBC: Recent Labs  Lab 07/18/19 1246 07/21/19 0301  WBC 6.2 5.4  NEUTROABS  --  2.7  HGB 11.4* 12.1  HCT 38.7 40.1  MCV 91.5 88.9  PLT 238 478   Basic Metabolic Panel: Recent Labs  Lab 07/21/19 0301 07/22/19 0328 07/22/19 0750 07/23/19 0336 07/24/19 0220  NA 139 137 141 142 139  K 3.5 6.2* 3.7 3.8 3.8  CL 95* 96* 98 104 99  CO2 33* 31 34* 27 30  GLUCOSE 110* 125* 113* 92 160*  BUN 22 34* 32* 29* 29*  CREATININE 1.68* 2.05* 1.98* 1.61* 1.62*  CALCIUM 9.5 8.8* 8.8* 9.1 9.2   GFR: Estimated Creatinine Clearance: 31.3 mL/min (A) (by C-G formula based on SCr of 1.62 mg/dL (H)). Liver Function Tests: No results for input(s): AST, ALT, ALKPHOS, BILITOT, PROT, ALBUMIN in the last 168 hours. No results for input(s): LIPASE, AMYLASE in the last 168 hours. No results for input(s): AMMONIA in the last 168 hours. Coagulation Profile: No results for input(s): INR, PROTIME in the last 168 hours. Cardiac Enzymes: No results for input(s): CKTOTAL, CKMB, CKMBINDEX, TROPONINI in the last 168 hours. BNP (last 3 results) No results for input(s): PROBNP in the last 8760 hours. HbA1C: No results for input(s): HGBA1C in the last 72 hours. CBG: Recent Labs  Lab 07/23/19 1626 07/23/19 2109 07/24/19 0757 07/24/19 1219 07/24/19 1631  GLUCAP 108* 75 95 198* 153*   Lipid Profile: No results for input(s): CHOL, HDL, LDLCALC, TRIG, CHOLHDL, LDLDIRECT in the last 72 hours. Thyroid Function Tests: No results for input(s): TSH, T4TOTAL, FREET4, T3FREE, THYROIDAB in the last 72 hours. Anemia Panel: No results for input(s): VITAMINB12, FOLATE, FERRITIN, TIBC, IRON, RETICCTPCT in the last 72 hours. Sepsis Labs: No results for input(s): PROCALCITON, LATICACIDVEN in the last 168 hours.  Recent Results (from the past 240 hour(s))  SARS Coronavirus 2 by RT PCR (hospital order, performed in [facility] hospital lab) Nasopharyngeal Nasopharyngeal Swab      Status: None   Collection Time: 07/15/19 12:28 AM   Specimen: Nasopharyngeal Swab  Result Value Ref Range Status   SARS Coronavirus 2 NEGATIVE NEGATIVE Final    Comment: (NOTE) SARS-CoV-2 target nucleic acids are NOT DETECTED.  The SARS-CoV-2 RNA is generally detectable in upper and lower respiratory specimens during the acute phase of infection. The lowest concentration of SARS-CoV-2 viral copies this assay can detect is 250 copies / mL. A negative result does not preclude SARS-CoV-2 infection and should not be used as the sole basis for treatment or other patient management decisions.  A negative result may occur with improper specimen collection / handling, submission of specimen other than nasopharyngeal swab, presence of viral mutation(s) within the areas targeted by this assay, and inadequate number of viral copies (<250 copies / mL). A negative result must be combined with clinical observations, patient history, and epidemiological information.  Fact Sheet for Patients:   StrictlyIdeas.no  Fact Sheet for Healthcare Providers: BankingDealers.co.za  This test is not yet  approved or  cleared by the [place] and has been authorized for detection and/or diagnosis of SARS-CoV-2 by FDA under an Emergency Use Authorization (EUA).  This EUA will remain in effect (meaning this test can be used) for the duration of the COVID-19 declaration under Section 564(b)(1) of the Act, 21 U.S.C. section 360bbb-3(b)(1), unless the authorization is terminated or revoked sooner.  Performed at [facility], [place] [address] [place], [place] [address]   SARS CORONAVIRUS 2 (TAT 6-24 HRS) Nasopharyngeal Nasopharyngeal Swab     Status: None   Collection Time: 07/19/19  3:24 PM   Specimen: Nasopharyngeal Swab  Result Value Ref Range Status   SARS Coronavirus 2 NEGATIVE NEGATIVE Final    Comment: (NOTE) SARS-CoV-2 target nucleic acids are  NOT DETECTED.  The SARS-CoV-2 RNA is generally detectable in upper and lower respiratory specimens during the acute phase of infection. Negative results do not preclude SARS-CoV-2 infection, do not rule out co-infections with other pathogens, and should not be used as the sole basis for treatment or other patient management decisions. Negative results must be combined with clinical observations, patient history, and epidemiological information. The expected result is Negative.  Fact Sheet for Patients: SugarRoll.be  Fact Sheet for Healthcare Providers: https://www.woods-mathews.com/  This test is not yet approved or cleared by the [place] FDA and  has been authorized for detection and/or diagnosis of SARS-CoV-2 by FDA under an Emergency Use Authorization (EUA). This EUA will remain  in effect (meaning this test can be used) for the duration of the COVID-19 declaration under Se ction 564(b)(1) of the Act, 21 U.S.C. section 360bbb-3(b)(1), unless the authorization is terminated or revoked sooner.  Performed at [facility], [place] [address], [place], [place] [address]          Radiology Studies: No results found.      Scheduled Meds: . apixaban  5 mg Oral BID  . aspirin EC  81 mg Oral Daily  . atorvastatin  20 mg Oral QHS  . budesonide (PULMICORT) nebulizer solution  0.25 mg Nebulization BID  . diltiazem  30 mg Oral Q6H  . memantine  28 mg Oral Daily   And  . donepezil  10 mg Oral QHS  . ferrous sulfate  325 mg Oral BID WC  . insulin aspart  0-9 Units Subcutaneous TID WC  . insulin glargine  10 Units Subcutaneous Daily  . metoprolol tartrate  75 mg Oral BID  . modafinil  100 mg Oral Daily  . pantoprazole  40 mg Oral Daily  . perphenazine  4 mg Oral TID  . QUEtiapine  100 mg Oral QHS  . sodium chloride flush  10-40 mL Intracatheter Q12H   Continuous Infusions:    LOS: 9 days   Alma Friendly,  MD [facility]   If 7PM-7AM, please contact night-coverage [email or web address]  07/24/2019, 5:47 PM

## 2019-07-25 LAB — SARS CORONAVIRUS 2 BY RT PCR (HOSPITAL ORDER, PERFORMED IN ~~LOC~~ HOSPITAL LAB): SARS Coronavirus 2: NEGATIVE

## 2019-07-25 LAB — BASIC METABOLIC PANEL
Anion gap: 15 (ref 5–15)
BUN: 30 mg/dL — ABNORMAL HIGH (ref 8–23)
CO2: 22 mmol/L (ref 22–32)
Calcium: 9 mg/dL (ref 8.9–10.3)
Chloride: 104 mmol/L (ref 98–111)
Creatinine, Ser: 1.56 mg/dL — ABNORMAL HIGH (ref 0.44–1.00)
GFR calc Af Amer: 37 mL/min — ABNORMAL LOW (ref >60)
GFR calc non Af Amer: 32 mL/min — ABNORMAL LOW (ref >60)
Glucose, Bld: 105 mg/dL — ABNORMAL HIGH (ref 70–99)
Potassium: 4.5 mmol/L (ref 3.5–5.1)
Sodium: 141 mmol/L (ref 135–145)

## 2019-07-25 LAB — GLUCOSE, CAPILLARY
Glucose-Capillary: 105 mg/dL — ABNORMAL HIGH (ref 70–99)
Glucose-Capillary: 105 mg/dL — ABNORMAL HIGH (ref 70–99)
Glucose-Capillary: 158 mg/dL — ABNORMAL HIGH (ref 70–99)
Glucose-Capillary: 122 mg/dL — ABNORMAL HIGH (ref 70–99)

## 2019-07-25 MED ORDER — ACETAMINOPHEN 325 MG PO TABS
650.0000 mg | ORAL_TABLET | Freq: Four times a day (QID) | ORAL | Status: DC | PRN
  Administered 2019-07-25: 650 mg via ORAL
  Filled 2019-07-25: qty 2

## 2019-07-25 NOTE — Progress Notes (Signed)
PROGRESS NOTE    Cheryl [name]  PIR:[id number] DOB: [birth year] DOA: 07/14/2019 PCP: Letta Median, MD   Brief Narrative: 79 year old COPD, Systolic Heart EF 63-01 %, schizophrenia, CKD, recently discharge from [place] treated for A flutter, COPD HF exacerbation, presents with worsening SOB, Respiratory failure. Patient admitted with acute hypoxic hypercapnic respiratory failure secondary to heart failure exacerbation, new ejection fraction at 35%.  She was treated with IV Lasix and was placed on BiPAP.  Her respiratory status subsequently improved.  Patient developed chest pain,  was transitioned to IV heparin from Eliquis.  Cardiology is not planning future intervention   Assessment & Plan:   Principal Problem:   Acute respiratory failure with hypoxia (HCC) Active Problems:   COPD exacerbation (HCC)   HTN (hypertension)   Dementia (HCC)   Persistent atrial fibrillation (HCC)   Acute on chronic systolic CHF (congestive heart failure) (HCC)   Acute hypoxic/hypercapneic respiratory failure Acute on chronic systolic heart failure exacerbation ejection fraction 35% Currently transitioned to room air Cardiology on board, IV Lasix, changed p.o. Lasix to torsemide, currently held (after discussing with cardiology Dr. Acie Fredrickson on 07/22/2019) Continue nebs treatments Continue with supplemental oxygen as needed, BIPAP at HS and PRN  A flutter  RVR TSH normal Heart rate controlled Continue oral Cardizem, metoprolol, Eliquis Noted 5 second pause while on amiodarone and toprol Cardiology on board, appreciate recs  Chest pain, epigastric pain EKG changes. ST depression anterior lead.  Troponin trending downwards Cardiology on board, no further intervention  Diabetes mellitus type 2 SSI, Lantus, Accu-Cheks, hypoglycemic protocol  AKI on CKD stage IIIb Worsening Baseline creatinine around 1.3  Hold torsemide Daily BMP   Hyperlipidemia Continue with statins.   History of  schizophrenia Continue with xanax as needed and decreased seroquel  History of dementia She is more alert. Oriented to person only [facility] was contacted ([phone]), patient has developmental delay and she doesn't speak a lot, she will repeat what is said to her  History of lung cancer  S./p radiation     Estimated body mass index is 33.91 kg/m as calculated from the following:   Height as of this encounter: 5\' 4"  (1.626 m).   Weight as of this encounter: 89.6 kg.   DVT prophylaxis: Eliquis Code Status: Full code Family Communication: Son updated over phone7/6 Disposition Plan:  Status is: Inpatient  Remains inpatient appropriate because:Hemodynamically unstable, Persistent severe electrolyte disturbances and Inpatient level of care appropriate due to severity of illness   Dispo: The patient is from: ALF              Anticipated d/c is to: SNF              Anticipated d/c date is: 1 day              Patient currently is medically stable to d/c.  Patient's BiPAP will be ready for patient at SNF on 07/26/19    Consultants:   CCM  Cardiology   Procedures:   None  Antimicrobials:  None   Subjective: Patient seen and examined at bedside, denies any new complaints.  Objective: Vitals:   07/25/19 0752 07/25/19 0818 07/25/19 1354 07/25/19 1623  BP:  (!) 145/85  138/80  Pulse:  66  69  Resp:  16  18  Temp:  98.6 F (37 C)  98.4 F (36.9 C)  TempSrc:      SpO2: 99% 100% 100% 100%  Weight:  Height:       No intake or output data in the 24 hours ending 07/25/19 1711 Filed Weights   07/23/19 0500 07/24/19 0500 07/25/19 0500  Weight: 88.5 kg 88.3 kg 89.6 kg    Examination:   General: NAD, stutters  Cardiovascular: S1, S2 present  Respiratory: CTAB  Abdomen: Soft, nontender, nondistended, bowel sounds present  Musculoskeletal: No bilateral pedal edema noted  Skin: Normal  Psychiatry: Normal mood     Data Reviewed: I have  personally reviewed following labs and imaging studies  CBC: Recent Labs  Lab 07/21/19 0301  WBC 5.4  NEUTROABS 2.7  HGB 12.1  HCT 40.1  MCV 88.9  PLT 188   Basic Metabolic Panel: Recent Labs  Lab 07/22/19 0328 07/22/19 0750 07/23/19 0336 07/24/19 0220 07/25/19 0339  NA 137 141 142 139 141  K 6.2* 3.7 3.8 3.8 4.5  CL 96* 98 104 99 104  CO2 31 34* 27 30 22   GLUCOSE 125* 113* 92 160* 105*  BUN 34* 32* 29* 29* 30*  CREATININE 2.05* 1.98* 1.61* 1.62* 1.56*  CALCIUM 8.8* 8.8* 9.1 9.2 9.0   GFR: Estimated Creatinine Clearance: 32.8 mL/min (A) (by C-G formula based on SCr of 1.56 mg/dL (H)). Liver Function Tests: No results for input(s): AST, ALT, ALKPHOS, BILITOT, PROT, ALBUMIN in the last 168 hours. No results for input(s): LIPASE, AMYLASE in the last 168 hours. No results for input(s): AMMONIA in the last 168 hours. Coagulation Profile: No results for input(s): INR, PROTIME in the last 168 hours. Cardiac Enzymes: No results for input(s): CKTOTAL, CKMB, CKMBINDEX, TROPONINI in the last 168 hours. BNP (last 3 results) No results for input(s): PROBNP in the last 8760 hours. HbA1C: No results for input(s): HGBA1C in the last 72 hours. CBG: Recent Labs  Lab 07/24/19 1631 07/24/19 2137 07/25/19 0817 07/25/19 1223 07/25/19 1622  GLUCAP 153* 182* 105* 105* 158*   Lipid Profile: No results for input(s): CHOL, HDL, LDLCALC, TRIG, CHOLHDL, LDLDIRECT in the last 72 hours. Thyroid Function Tests: No results for input(s): TSH, T4TOTAL, FREET4, T3FREE, THYROIDAB in the last 72 hours. Anemia Panel: No results for input(s): VITAMINB12, FOLATE, FERRITIN, TIBC, IRON, RETICCTPCT in the last 72 hours. Sepsis Labs: No results for input(s): PROCALCITON, LATICACIDVEN in the last 168 hours.  Recent Results (from the past 240 hour(s))  SARS CORONAVIRUS 2 (TAT 6-24 HRS) Nasopharyngeal Nasopharyngeal Swab     Status: None   Collection Time: 07/19/19  3:24 PM   Specimen:  Nasopharyngeal Swab  Result Value Ref Range Status   SARS Coronavirus 2 NEGATIVE NEGATIVE Final    Comment: (NOTE) SARS-CoV-2 target nucleic acids are NOT DETECTED.  The SARS-CoV-2 RNA is generally detectable in upper and lower respiratory specimens during the acute phase of infection. Negative results do not preclude SARS-CoV-2 infection, do not rule out co-infections with other pathogens, and should not be used as the sole basis for treatment or other patient management decisions. Negative results must be combined with clinical observations, patient history, and epidemiological information. The expected result is Negative.  Fact Sheet for Patients: SugarRoll.be  Fact Sheet for Healthcare Providers: https://www.woods-mathews.com/  This test is not yet approved or cleared by the [place] FDA and  has been authorized for detection and/or diagnosis of SARS-CoV-2 by FDA under an Emergency Use Authorization (EUA). This EUA will remain  in effect (meaning this test can be used) for the duration of the COVID-19 declaration under Se ction 564(b)(1) of the Act, 21 U.S.C. section  360bbb-3(b)(1), unless the authorization is terminated or revoked sooner.  Performed at [facility], [place] [address] [place], [place] [address]   SARS Coronavirus 2 by RT PCR (hospital order, performed in [facility] hospital lab) Nasopharyngeal Nasopharyngeal Swab     Status: None   Collection Time: 07/25/19 12:14 PM   Specimen: Nasopharyngeal Swab  Result Value Ref Range Status   SARS Coronavirus 2 NEGATIVE NEGATIVE Final    Comment: (NOTE) SARS-CoV-2 target nucleic acids are NOT DETECTED.  The SARS-CoV-2 RNA is generally detectable in upper and lower respiratory specimens during the acute phase of infection. The lowest concentration of SARS-CoV-2 viral copies this assay can detect is 250 copies / mL. A negative result does not preclude SARS-CoV-2  infection and should not be used as the sole basis for treatment or other patient management decisions.  A negative result may occur with improper specimen collection / handling, submission of specimen other than nasopharyngeal swab, presence of viral mutation(s) within the areas targeted by this assay, and inadequate number of viral copies (<250 copies / mL). A negative result must be combined with clinical observations, patient history, and epidemiological information.  Fact Sheet for Patients:   StrictlyIdeas.no  Fact Sheet for Healthcare Providers: BankingDealers.co.za  This test is not yet approved or  cleared by the [place] FDA and has been authorized for detection and/or diagnosis of SARS-CoV-2 by FDA under an Emergency Use Authorization (EUA).  This EUA will remain in effect (meaning this test can be used) for the duration of the COVID-19 declaration under Section 564(b)(1) of the Act, 21 U.S.C. section 360bbb-3(b)(1), unless the authorization is terminated or revoked sooner.  Performed at [facility], [place] [address], [place], [place] [address]          Radiology Studies: No results found.      Scheduled Meds: . apixaban  5 mg Oral BID  . aspirin EC  81 mg Oral Daily  . atorvastatin  20 mg Oral QHS  . budesonide (PULMICORT) nebulizer solution  0.25 mg Nebulization BID  . diltiazem  30 mg Oral Q6H  . memantine  28 mg Oral Daily   And  . donepezil  10 mg Oral QHS  . ferrous sulfate  325 mg Oral BID WC  . insulin aspart  0-9 Units Subcutaneous TID WC  . insulin glargine  10 Units Subcutaneous Daily  . metoprolol tartrate  75 mg Oral BID  . modafinil  100 mg Oral Daily  . pantoprazole  40 mg Oral Daily  . perphenazine  4 mg Oral TID  . QUEtiapine  100 mg Oral QHS  . sodium chloride flush  10-40 mL Intracatheter Q12H   Continuous Infusions:    LOS: 10 days   Alma Friendly, MD [facility]   If 7PM-7AM, please contact night-coverage [email or web address]  07/25/2019, 5:11 PM

## 2019-07-25 NOTE — Progress Notes (Signed)
Occupational Therapy Treatment Patient Details Name: Cheryl [name] MRN: [id number] DOB: 02-13-1940 Today's Date: 07/25/2019    History of present illness Pt is 79 yo female with PMH of COPD, CHF, schizophrenia, dementia, and CKD.  Pt w/ recent d/c from hospital to SNF due to aflutter and COPD.  Pt now presents with worsening SHOB and resp failue.  Pt admitted with acute resp failure with hypoxia, a flutter/RVR; Chest -epigastric pain.   OT comments  Patient continues to make steady progress towards goals in skilled OT session. Patient's session encompassed functional transfers and ADLs at EOB progressing to set up for lunch. Pt participatory in session, however due to cognitive impairment at baseline, requires multimodal cues in order to complete tasks. Pt with noted improvement in dressing and basic ADLs at EOB, then completing one person HHA to transfer to chair. Pt with need for cues to pace self when self-feeding (pt rushing through food) and cues to find utensils. Discharge remains appropriate at this time; will continue to follow acutely.    Follow Up Recommendations  SNF;Supervision/Assistance - 24 hour    Equipment Recommendations  None recommended by OT    Recommendations for Other Services      Precautions / Restrictions Precautions Precautions: Fall       Mobility Bed Mobility Overal bed mobility: Needs Assistance Bed Mobility: Supine to Sit     Supine to sit: Modified independent (Device/Increase time)     General bed mobility comments: Increased verbal cues and gestures, but able to complete mod I  Transfers Overall transfer level: Needs assistance Equipment used: 1 person hand held assist Transfers: Sit to/from Stand Sit to Stand: Min assist         General transfer comment: Motivated by lunch; but requires visual and verbal cues to complete transfers    Balance Overall balance assessment: Needs assistance Sitting-balance support: Feet supported;No  upper extremity supported Sitting balance-Leahy Scale: Good     Standing balance support: Bilateral upper extremity supported;During functional activity Standing balance-Leahy Scale: Poor                             ADL either performed or assessed with clinical judgement   ADL Overall ADL's : Needs assistance/impaired Eating/Feeding: Set up;Supervision/ safety;Sitting Eating/Feeding Details (indicate cue type and reason): Setup for self feeding, did require cues for pacing as pt self feeds quickly  Grooming: Wash/dry face;Wash/dry hands;Set up Grooming Details (indicate cue type and reason): Sitting EOB, verbal cues to initiate due to baseline cognitive deficits             Lower Body Dressing: Set up Lower Body Dressing Details (indicate cue type and reason): Able to unpackage socks from plastic and don with min verbal cues sitting EOB Toilet Transfer: Minimal assistance;Ambulation Toilet Transfer Details (indicate cue type and reason): HHA stand pivot to recliner         Functional mobility during ADLs: Minimal assistance;Cueing for safety;Cueing for sequencing General ADL Comments: Pt participatory, likely close to baseline due to cognitive impairments     Vision       Perception     Praxis      Cognition Arousal/Alertness: Awake/alert Behavior During Therapy: WFL for tasks assessed/performed Overall Cognitive Status: History of cognitive impairments - at baseline  General Comments: Pt follows commands with increased time and tactile cues; repeats words frequently        Exercises     Shoulder Instructions       General Comments      Pertinent Vitals/ Pain       Pain Assessment: Faces Faces Pain Scale: No hurt  Home Living                                          Prior Functioning/Environment              Frequency  Min 2X/week        Progress Toward  Goals  OT Goals(current goals can now be found in the care plan section)  Progress towards OT goals: Progressing toward goals  Acute Rehab OT Goals Patient Stated Goal: none stated OT Goal Formulation: Patient unable to participate in goal setting Time For Goal Achievement: 08/01/19 Potential to Achieve Goals: Good  Plan Discharge plan remains appropriate;Frequency remains appropriate    Co-evaluation                 AM-PAC OT "6 Clicks" Daily Activity     Outcome Measure   Help from another person eating meals?: None Help from another person taking care of personal grooming?: A Little Help from another person toileting, which includes using toliet, bedpan, or urinal?: A Lot Help from another person bathing (including washing, rinsing, drying)?: A Lot Help from another person to put on and taking off regular upper body clothing?: A Little Help from another person to put on and taking off regular lower body clothing?: A Little 6 Click Score: 17    End of Session    OT Visit Diagnosis: Other abnormalities of gait and mobility (R26.89);Muscle weakness (generalized) (M62.81);Other symptoms and signs involving cognitive function   Activity Tolerance Patient tolerated treatment well   Patient Left in chair;with call bell/phone within reach;with chair alarm set   Nurse Communication Mobility status;Other (comment) ([place] placement)        Time: 3212-2482 OT Time Calculation (min): 17 min  Charges: OT General Charges $OT Visit: 1 Visit OT Treatments $Self Care/Home Management : 8-22 mins  Corinne Ports E. Ivalee Strauser, COTA/L Acute Rehabilitation Services [phone] [place] 07/25/2019, 2:02 PM

## 2019-07-26 LAB — GLUCOSE, CAPILLARY
Glucose-Capillary: 84 mg/dL (ref 70–99)
Glucose-Capillary: 97 mg/dL (ref 70–99)
Glucose-Capillary: 175 mg/dL — ABNORMAL HIGH (ref 70–99)
Glucose-Capillary: 122 mg/dL — ABNORMAL HIGH (ref 70–99)
Glucose-Capillary: 185 mg/dL — ABNORMAL HIGH (ref 70–99)

## 2019-07-26 NOTE — Progress Notes (Signed)
Patient is on NIV QHS at this time

## 2019-07-26 NOTE — TOC Progression Note (Signed)
Transition of Care [facility]) - Progression Note    Patient Details  Name: Cheryl [name] MRN: [id number] Date of Birth: [birth year]  [facility]) CM/SW [place], [place] Phone Number: 07/26/2019, 4:27 PM  Clinical Narrative:     CSW reached out to [facility] about Bipap machine. CSW was told tat machine has not arrived and they are still waiting.   CSW will continue to follow.   Expected Discharge Plan: [place] Barriers to Discharge: Continued Medical Work up  Expected Discharge Plan and Services Expected Discharge Plan: [place] arrangements for the past 2 months: [place]                                       Social Determinants of Health (SDOH) Interventions    Readmission Risk Interventions Readmission Risk Prevention Plan 05/10/2019  Transportation Screening Complete  PCP or Specialist Appt within 3-5 Days Complete  HRI or Home Care Consult Complete  Medication Review (RN Care Manager) Complete  Some recent data might be hidden   Emeterio Reeve, Latanya Presser, [place] Social Worker [phone]

## 2019-07-26 NOTE — Progress Notes (Signed)
Physical Therapy Treatment Patient Details Name: Cheryl [name] MRN: [id number] DOB: 25-Feb-1940 Today's Date: 07/26/2019    History of Present Illness Pt is 79 yo female with PMH of COPD, CHF, schizophrenia, dementia, and CKD.  Pt w/ recent d/c from hospital to SNF due to aflutter and COPD.  Pt now presents with worsening SHOB and resp failue.  Pt admitted with acute resp failure with hypoxia, a flutter/RVR; Chest -epigastric pain.    PT Comments    Patient received in bed, very pleasant and willing to work with therapy today; only gave repeated simple answers but followed commands appropriately and able to answer very simple questions. See below for assist levels. Had had incontinent BM in bed, able to roll with mod(I) and bed rails for pericare but needed Max cues for washing herself in the front as she began to clean the bedrails when handed washcloth. Noted some possible motor apraxia but able to get through with visual and tactile cues. Gait trained approximately 90f with RW/MinA, VSS on RA but limited by fatigue and with difficulty navigating around obstacles and maintaining good distance from RW. Left up in chair with alarm active, NT present and attending, all needs otherwise met.    Follow Up Recommendations  SNF     Equipment Recommendations  None recommended by PT    Recommendations for Other Services       Precautions / Restrictions Precautions Precautions: Fall Restrictions Weight Bearing Restrictions: No    Mobility  Bed Mobility Overal bed mobility: Needs Assistance Bed Mobility: Supine to Sit;Rolling Rolling: Modified independent (Device/Increase time)   Supine to sit: Min guard     General bed mobility comments: max verbal cues and gestures, needed initiation of legs towards EOB but once she got the idea, able to get to EOB with min guard  Transfers Overall transfer level: Needs assistance Equipment used: Rolling walker (2 wheeled) Transfers: Sit to/from  Stand Sit to Stand: Min guard         General transfer comment: min guard and visual cues to stand, then hand over hand to hold onto correct spots on RW  Ambulation/Gait Ambulation/Gait assistance: Min assist Gait Distance (Feet): 10 Feet Assistive device: Rolling walker (2 wheeled) Gait Pattern/deviations: Step-through pattern;Decreased step length - right;Decreased step length - left;Decreased stride length;Trunk flexed;Drifts right/left Gait velocity: decr   General Gait Details: MinA to steer RW in space of room and navigate around EOB. Also light MinA for steadying and safe distance from [employer]obility    Modified Rankin (Stroke Patients Only)       Balance Overall balance assessment: Needs assistance Sitting-balance support: Feet supported;No upper extremity supported Sitting balance-Leahy Scale: Good Sitting balance - Comments: steady static balance   Standing balance support: Bilateral upper extremity supported;During functional activity Standing balance-Leahy Scale: Fair Standing balance comment: reliant on RW/BUE support                            Cognition Arousal/Alertness: Awake/alert Behavior During Therapy: WFL for tasks assessed/performed Overall Cognitive Status: History of cognitive impairments - at baseline                                 General Comments: Pt follows commands with increased time and tactile cues; repeats words frequently but appropriate with  PT throughout session      Exercises      General Comments General comments (skin integrity, edema, etc.): VSS on RA, HR no greater than 80BPM      Pertinent Vitals/Pain Pain Assessment: Faces Pain Score: 0-No pain Faces Pain Scale: No hurt    Home Living                      Prior Function            PT Goals (current goals can now be found in the care plan section) Acute Rehab PT Goals Patient Stated Goal:  none stated PT Goal Formulation: Patient unable to participate in goal setting Time For Goal Achievement: 08/01/19 Potential to Achieve Goals: Good Progress towards PT goals: Progressing toward goals    Frequency    Min 2X/week      PT Plan Current plan remains appropriate    Co-evaluation              AM-PAC PT "6 Clicks" Mobility   Outcome Measure  Help needed turning from your back to your side while in a flat bed without using bedrails?: A Little Help needed moving from lying on your back to sitting on the side of a flat bed without using bedrails?: A Little Help needed moving to and from a bed to a chair (including a wheelchair)?: A Little Help needed standing up from a chair using your arms (e.g., wheelchair or bedside chair)?: A Little Help needed to walk in hospital room?: A Little Help needed climbing 3-5 steps with a railing? : A Lot 6 Click Score: 17    End of Session Equipment Utilized During Treatment: Gait belt Activity Tolerance: Patient tolerated treatment well Patient left: in chair;with call bell/phone within reach;with chair alarm set Nurse Communication: Mobility status PT Visit Diagnosis: Unsteadiness on feet (R26.81);Muscle weakness (generalized) (M62.81)     Time: 1561-5379 PT Time Calculation (min) (ACUTE ONLY): 34 min  Charges:  $Gait Training: 8-22 mins $Therapeutic Activity: 8-22 mins                     Windell Norfolk, DPT, PN1   Supplemental Physical Therapist [place]    Pager [phone] Acute Rehab Office [phone]

## 2019-07-26 NOTE — Progress Notes (Signed)
PROGRESS NOTE    Cheryl [name]  ZOX:[id number] DOB: June 19, 1940 DOA: 07/14/2019 PCP: Letta Median, MD   Brief Narrative: 79 year old COPD, Systolic Heart EF 81-19 %, schizophrenia, CKD, recently discharge from [facility] treated for A flutter, COPD HF exacerbation, presents with worsening SOB, Respiratory failure. Patient admitted with acute hypoxic hypercapnic respiratory failure secondary to heart failure exacerbation, new ejection fraction at 35%.  She was treated with IV Lasix and was placed on BiPAP.  Her respiratory status subsequently improved.  Patient developed chest pain,  was transitioned to IV heparin from Eliquis.  Cardiology is not planning future intervention   Assessment & Plan:   Principal Problem:   Acute respiratory failure with hypoxia (HCC) Active Problems:   COPD exacerbation (HCC)   HTN (hypertension)   Dementia (HCC)   Persistent atrial fibrillation (HCC)   Acute on chronic systolic CHF (congestive heart failure) (HCC)   Acute hypoxic/hypercapneic respiratory failure Acute on chronic systolic heart failure exacerbation ejection fraction 35% Currently transitioned to room air Cardiology on board, IV Lasix, changed p.o. Lasix to torsemide, currently held (after discussing with cardiology Dr. Acie Fredrickson on 07/22/2019), but resume torsemide 20 mg upon d/c Continue nebs treatments Continue with supplemental oxygen as needed, BIPAP at HS and PRN  A flutter  RVR TSH normal Heart rate controlled Continue oral Cardizem, metoprolol, Eliquis Noted 5 second pause while on amiodarone and toprol Cardiology on board, appreciate recs  Chest pain, epigastric pain EKG changes. ST depression anterior lead.  Troponin trending downwards Cardiology on board, no further intervention  Diabetes mellitus type 2 SSI, Lantus, Accu-Cheks, hypoglycemic protocol  AKI on CKD stage IIIb Baseline creatinine around 1.3  Hold torsemide, but may resume upon discharge to SNF Daily  BMP   Hyperlipidemia Continue with statins.   History of schizophrenia Continue with xanax as needed and decreased seroquel  History of dementia She is more alert. Oriented to person only [facility] was contacted ([phone]), patient has developmental delay and she doesn't speak a lot, she will repeat what is said to her  History of lung cancer  S./p radiation     Estimated body mass index is 33.87 kg/m as calculated from the following:   Height as of this encounter: 5\' 4"  (1.626 m).   Weight as of this encounter: 89.5 kg.   DVT prophylaxis: Eliquis Code Status: Full code Family Communication: Son updated over phone7/6 Disposition Plan:  Status is: Inpatient  Remains inpatient appropriate because:Hemodynamically unstable, Persistent severe electrolyte disturbances and Inpatient level of care appropriate due to severity of illness   Dispo: The patient is from: ALF              Anticipated d/c is to: SNF              Anticipated d/c date is: 1 day              Patient currently is medically stable to d/c.  Still awaiting patient's BiPAP to be delivered prior to discharge     Consultants:   CCM  Cardiology   Procedures:   None  Antimicrobials:  None   Subjective: Patient seen and examined at bedside.  Denies any new complaints  Objective: Vitals:   07/26/19 0640 07/26/19 0749 07/26/19 0750 07/26/19 0755  BP: (!) 126/98 (!) 82/69 (!) 139/96   Pulse: 64 (!) 105 60   Resp: 17 19    Temp: 98.1 F (36.7 C) 98.7 F (37.1 C)    TempSrc:  Oral     SpO2: 98% 99%  100%  Weight:      Height:        Intake/Output Summary (Last 24 hours) at 07/26/2019 1735 Last data filed at 07/26/2019 0500 Gross per 24 hour  Intake --  Output 1600 ml  Net -1600 ml   Filed Weights   07/24/19 0500 07/25/19 0500 07/26/19 0500  Weight: 88.3 kg 89.6 kg 89.5 kg    Examination:   General: NAD, stutters  Cardiovascular: S1, S2 present  Respiratory:  CTAB  Abdomen: Soft, nontender, nondistended, bowel sounds present  Musculoskeletal: No bilateral pedal edema noted  Skin: Normal  Psychiatry: Normal mood     Data Reviewed: I have personally reviewed following labs and imaging studies  CBC: Recent Labs  Lab 07/21/19 0301  WBC 5.4  NEUTROABS 2.7  HGB 12.1  HCT 40.1  MCV 88.9  PLT 161   Basic Metabolic Panel: Recent Labs  Lab 07/22/19 0328 07/22/19 0750 07/23/19 0336 07/24/19 0220 07/25/19 0339  NA 137 141 142 139 141  K 6.2* 3.7 3.8 3.8 4.5  CL 96* 98 104 99 104  CO2 31 34* 27 30 22   GLUCOSE 125* 113* 92 160* 105*  BUN 34* 32* 29* 29* 30*  CREATININE 2.05* 1.98* 1.61* 1.62* 1.56*  CALCIUM 8.8* 8.8* 9.1 9.2 9.0   GFR: Estimated Creatinine Clearance: 32.7 mL/min (A) (by C-G formula based on SCr of 1.56 mg/dL (H)). Liver Function Tests: No results for input(s): AST, ALT, ALKPHOS, BILITOT, PROT, ALBUMIN in the last 168 hours. No results for input(s): LIPASE, AMYLASE in the last 168 hours. No results for input(s): AMMONIA in the last 168 hours. Coagulation Profile: No results for input(s): INR, PROTIME in the last 168 hours. Cardiac Enzymes: No results for input(s): CKTOTAL, CKMB, CKMBINDEX, TROPONINI in the last 168 hours. BNP (last 3 results) No results for input(s): PROBNP in the last 8760 hours. HbA1C: No results for input(s): HGBA1C in the last 72 hours. CBG: Recent Labs  Lab 07/25/19 2050 07/26/19 0621 07/26/19 0718 07/26/19 1212 07/26/19 1649  GLUCAP 122* 84 97 175* 122*   Lipid Profile: No results for input(s): CHOL, HDL, LDLCALC, TRIG, CHOLHDL, LDLDIRECT in the last 72 hours. Thyroid Function Tests: No results for input(s): TSH, T4TOTAL, FREET4, T3FREE, THYROIDAB in the last 72 hours. Anemia Panel: No results for input(s): VITAMINB12, FOLATE, FERRITIN, TIBC, IRON, RETICCTPCT in the last 72 hours. Sepsis Labs: No results for input(s): PROCALCITON, LATICACIDVEN in the last 168  hours.  Recent Results (from the past 240 hour(s))  SARS CORONAVIRUS 2 (TAT 6-24 HRS) Nasopharyngeal Nasopharyngeal Swab     Status: None   Collection Time: 07/19/19  3:24 PM   Specimen: Nasopharyngeal Swab  Result Value Ref Range Status   SARS Coronavirus 2 NEGATIVE NEGATIVE Final    Comment: (NOTE) SARS-CoV-2 target nucleic acids are NOT DETECTED.  The SARS-CoV-2 RNA is generally detectable in upper and lower respiratory specimens during the acute phase of infection. Negative results do not preclude SARS-CoV-2 infection, do not rule out co-infections with other pathogens, and should not be used as the sole basis for treatment or other patient management decisions. Negative results must be combined with clinical observations, patient history, and epidemiological information. The expected result is Negative.  Fact Sheet for Patients: SugarRoll.be  Fact Sheet for Healthcare Providers: https://www.woods-mathews.com/  This test is not yet approved or cleared by the [place] FDA and  has been authorized for detection and/or diagnosis of SARS-CoV-2 by  FDA under an Emergency Use Authorization (EUA). This EUA will remain  in effect (meaning this test can be used) for the duration of the COVID-19 declaration under Se ction 564(b)(1) of the Act, 21 U.S.C. section 360bbb-3(b)(1), unless the authorization is terminated or revoked sooner.  Performed at [facility], [place] [address] [place], [place] [address]   SARS Coronavirus 2 by RT PCR (hospital order, performed in [facility] hospital lab) Nasopharyngeal Nasopharyngeal Swab     Status: None   Collection Time: 07/25/19 12:14 PM   Specimen: Nasopharyngeal Swab  Result Value Ref Range Status   SARS Coronavirus 2 NEGATIVE NEGATIVE Final    Comment: (NOTE) SARS-CoV-2 target nucleic acids are NOT DETECTED.  The SARS-CoV-2 RNA is generally detectable in upper and lower respiratory  specimens during the acute phase of infection. The lowest concentration of SARS-CoV-2 viral copies this assay can detect is 250 copies / mL. A negative result does not preclude SARS-CoV-2 infection and should not be used as the sole basis for treatment or other patient management decisions.  A negative result may occur with improper specimen collection / handling, submission of specimen other than nasopharyngeal swab, presence of viral mutation(s) within the areas targeted by this assay, and inadequate number of viral copies (<250 copies / mL). A negative result must be combined with clinical observations, patient history, and epidemiological information.  Fact Sheet for Patients:   StrictlyIdeas.no  Fact Sheet for Healthcare Providers: BankingDealers.co.za  This test is not yet approved or  cleared by the [place] FDA and has been authorized for detection and/or diagnosis of SARS-CoV-2 by FDA under an Emergency Use Authorization (EUA).  This EUA will remain in effect (meaning this test can be used) for the duration of the COVID-19 declaration under Section 564(b)(1) of the Act, 21 U.S.C. section 360bbb-3(b)(1), unless the authorization is terminated or revoked sooner.  Performed at [facility], [place] [address] [place], [place] [address]          Radiology Studies: No results found.      Scheduled Meds:  apixaban  5 mg Oral BID   aspirin EC  81 mg Oral Daily   atorvastatin  20 mg Oral QHS   budesonide (PULMICORT) nebulizer solution  0.25 mg Nebulization BID   diltiazem  30 mg Oral Q6H   memantine  28 mg Oral Daily   And   donepezil  10 mg Oral QHS   ferrous sulfate  325 mg Oral BID WC   insulin aspart  0-9 Units Subcutaneous TID WC   insulin glargine  10 Units Subcutaneous Daily   metoprolol tartrate  75 mg Oral BID   modafinil  100 mg Oral Daily   pantoprazole  40 mg Oral Daily    perphenazine  4 mg Oral TID   QUEtiapine  100 mg Oral QHS   sodium chloride flush  10-40 mL Intracatheter Q12H   Continuous Infusions:    LOS: 11 days   Alma Friendly, MD [facility]   If 7PM-7AM, please contact night-coverage [email or web address]  07/26/2019, 5:35 PM

## 2019-07-27 LAB — BASIC METABOLIC PANEL
Anion gap: 12 (ref 5–15)
BUN: 31 mg/dL — ABNORMAL HIGH (ref 8–23)
CO2: 23 mmol/L (ref 22–32)
Calcium: 9.1 mg/dL (ref 8.9–10.3)
Chloride: 103 mmol/L (ref 98–111)
Creatinine, Ser: 1.62 mg/dL — ABNORMAL HIGH (ref 0.44–1.00)
GFR calc Af Amer: 35 mL/min — ABNORMAL LOW (ref >60)
GFR calc non Af Amer: 30 mL/min — ABNORMAL LOW (ref >60)
Glucose, Bld: 74 mg/dL (ref 70–99)
Potassium: 5 mmol/L (ref 3.5–5.1)
Sodium: 138 mmol/L (ref 135–145)

## 2019-07-27 LAB — CBC
HCT: 37.4 % (ref 36.0–46.0)
Hemoglobin: 11 g/dL — ABNORMAL LOW (ref 12.0–15.0)
MCH: 26.6 pg (ref 26.0–34.0)
MCHC: 29.4 g/dL — ABNORMAL LOW (ref 30.0–36.0)
MCV: 90.6 fL (ref 80.0–100.0)
Platelets: 328 10*3/uL (ref 150–400)
RBC: 4.13 MIL/uL (ref 3.87–5.11)
RDW: 14.9 % (ref 11.5–15.5)
WBC: 5.2 10*3/uL (ref 4.0–10.5)
nRBC: 0 % (ref 0.0–0.2)

## 2019-07-27 LAB — GLUCOSE, CAPILLARY
Glucose-Capillary: 102 mg/dL — ABNORMAL HIGH (ref 70–99)
Glucose-Capillary: 228 mg/dL — ABNORMAL HIGH (ref 70–99)
Glucose-Capillary: 102 mg/dL — ABNORMAL HIGH (ref 70–99)
Glucose-Capillary: 94 mg/dL (ref 70–99)

## 2019-07-27 MED ORDER — FUROSEMIDE 10 MG/ML IJ SOLN
20.0000 mg | Freq: Once | INTRAMUSCULAR | Status: AC
  Administered 2019-07-27: 20 mg via INTRAVENOUS
  Filled 2019-07-27: qty 2

## 2019-07-27 MED ORDER — DILTIAZEM HCL ER COATED BEADS 120 MG PO CP24
120.0000 mg | ORAL_CAPSULE | Freq: Every day | ORAL | Status: DC
  Administered 2019-07-27 – 2019-07-31 (×5): 120 mg via ORAL
  Filled 2019-07-27 (×5): qty 1

## 2019-07-27 MED ORDER — SODIUM ZIRCONIUM CYCLOSILICATE 10 G PO PACK
10.0000 g | PACK | Freq: Once | ORAL | Status: AC
  Administered 2019-07-27: 10 g via ORAL
  Filled 2019-07-27: qty 1

## 2019-07-27 NOTE — Progress Notes (Signed)
PROGRESS NOTE    Cheryl [name]  HFW:[id number] DOB: 06-08-1940 DOA: 07/14/2019 PCP: Letta Median, MD   Brief Narrative: 79 year old COPD, Systolic Heart EF 02-77 %, schizophrenia, CKD, recently discharge from [facility] treated for A flutter, COPD HF exacerbation, presents with worsening SOB, Respiratory failure. Patient admitted with acute hypoxic hypercapnic respiratory failure secondary to heart failure exacerbation, new ejection fraction at 35%.  She was treated with IV Lasix and was placed on BiPAP.  Her respiratory status subsequently improved.  Patient developed chest pain,  was transitioned to IV heparin from Eliquis.  Cardiology is not planning future intervention  Subjective: Patient seen and examined at bedside.  Denies any new complaints    Assessment & Plan:   Principal Problem:   Acute respiratory failure with hypoxia (HCC) Active Problems:   COPD exacerbation (HCC)   HTN (hypertension)   Dementia (HCC)   Persistent atrial fibrillation (HCC)   Acute on chronic systolic CHF (congestive heart failure) (HCC)   Acute hypoxic/hypercapneic respiratory failure/acute on chronic systolic CHF with EF 41%. -Appears to be multifactorial, due to acute on chronic systolic CHF with EF 28%, and A. fib with RVR, uncontrolled heart rate. -This appears to be significantly improved with IV diuresis, and heart rate control. -Appears to be euvolemic, she is currently off diuresis, likely will need to resume torsemide upon discharge. -Still requiring BiPAP at bedtime, social worker trying to arrange for BiPAP at the facility.  A flutter  RVR - TSH normal - Heart rate controlled - Continue oral Cardizem, metoprolol, Eliquis, I will go ahead and consolidate her Cardizem from 30 mg every 6 hours to Cardizem CD 120 mg oral daily. -She did require amiodarone initially, currently off amiodarone. -Cardiology has signed off.  Chest pain, epigastric pain EKG changes. ST depression  anterior lead.  Troponin trending downwards Cardiology on board, no further intervention  Diabetes mellitus type 2 SSI, Lantus, Accu-Cheks, hypoglycemic protocol  AKI on CKD stage IIIb Baseline creatinine around 1.3  Hold torsemide, but may resume upon discharge to SNF Daily BMP  -Potassium is 5, will give 1 dose of Lokelma  Hyperlipidemia Continue with statins.   History of schizophrenia Continue with xanax as needed and decreased seroquel  History of dementia She is more alert. Oriented to person only [facility] was contacted ([phone]), patient has developmental delay and she doesn't speak a lot, she will repeat what is said to her  History of lung cancer  S./p radiation    Estimated body mass index is 33.87 kg/m as calculated from the following:   Height as of this encounter: 5\' 4"  (1.626 m).   Weight as of this encounter: 89.5 kg.   DVT prophylaxis: Eliquis Code Status: Full code Family Communication: None at bedside Disposition Plan:  Status is: Inpatient  Remains inpatient appropriate because:Unsafe d/c plan (as awaiting BiPAP availability at the facility)   Dispo: The patient is from: ALF              Anticipated d/c is to: SNF              Anticipated d/c date is: 1 day              Patient currently is medically stable to d/c.  Still awaiting patient's BiPAP to be delivered prior to discharge     Consultants:   CCM  Cardiology   Procedures:   None  Antimicrobials:  None    Objective: Vitals:   07/26/19 2258  07/27/19 0426 07/27/19 0737 07/27/19 0757  BP:    (!) 138/108  Pulse: 82 91  74  Resp: (!) 24 17  11   Temp:    97.6 F (36.4 C)  TempSrc:      SpO2: 97%  95% 100%  Weight:      Height:       No intake or output data in the 24 hours ending 07/27/19 1216 Filed Weights   07/24/19 0500 07/25/19 0500 07/26/19 0500  Weight: 88.3 kg 89.6 kg 89.5 kg    Examination:  Awake Alert, Oriented X 3, No new F.N deficits,  Normal affect Symmetrical Chest wall movement, Good air movement bilaterally, CTAB RRR,No Gallops,Rubs or new Murmurs, No Parasternal Heave +ve B.Sounds, Abd Soft, No tenderness, No rebound - guarding or rigidity. No Cyanosis, Clubbing or edema, No new Rash or bruise        Data Reviewed: I have personally reviewed following labs and imaging studies  CBC: Recent Labs  Lab 07/21/19 0301 07/27/19 0244  WBC 5.4 5.2  NEUTROABS 2.7  --   HGB 12.1 11.0*  HCT 40.1 37.4  MCV 88.9 90.6  PLT 266 960   Basic Metabolic Panel: Recent Labs  Lab 07/22/19 0750 07/23/19 0336 07/24/19 0220 07/25/19 0339 07/27/19 0244  NA 141 142 139 141 138  K 3.7 3.8 3.8 4.5 5.0  CL 98 104 99 104 103  CO2 34* 27 30 22 23   GLUCOSE 113* 92 160* 105* 74  BUN 32* 29* 29* 30* 31*  CREATININE 1.98* 1.61* 1.62* 1.56* 1.62*  CALCIUM 8.8* 9.1 9.2 9.0 9.1   GFR: Estimated Creatinine Clearance: 31.5 mL/min (A) (by C-G formula based on SCr of 1.62 mg/dL (H)). Liver Function Tests: No results for input(s): AST, ALT, ALKPHOS, BILITOT, PROT, ALBUMIN in the last 168 hours. No results for input(s): LIPASE, AMYLASE in the last 168 hours. No results for input(s): AMMONIA in the last 168 hours. Coagulation Profile: No results for input(s): INR, PROTIME in the last 168 hours. Cardiac Enzymes: No results for input(s): CKTOTAL, CKMB, CKMBINDEX, TROPONINI in the last 168 hours. BNP (last 3 results) No results for input(s): PROBNP in the last 8760 hours. HbA1C: No results for input(s): HGBA1C in the last 72 hours. CBG: Recent Labs  Lab 07/26/19 1212 07/26/19 1649 07/26/19 2029 07/27/19 0800 07/27/19 1210  GLUCAP 175* 122* 185* 102* 228*   Lipid Profile: No results for input(s): CHOL, HDL, LDLCALC, TRIG, CHOLHDL, LDLDIRECT in the last 72 hours. Thyroid Function Tests: No results for input(s): TSH, T4TOTAL, FREET4, T3FREE, THYROIDAB in the last 72 hours. Anemia Panel: No results for input(s): VITAMINB12,  FOLATE, FERRITIN, TIBC, IRON, RETICCTPCT in the last 72 hours. Sepsis Labs: No results for input(s): PROCALCITON, LATICACIDVEN in the last 168 hours.  Recent Results (from the past 240 hour(s))  SARS CORONAVIRUS 2 (TAT 6-24 HRS) Nasopharyngeal Nasopharyngeal Swab     Status: None   Collection Time: 07/19/19  3:24 PM   Specimen: Nasopharyngeal Swab  Result Value Ref Range Status   SARS Coronavirus 2 NEGATIVE NEGATIVE Final    Comment: (NOTE) SARS-CoV-2 target nucleic acids are NOT DETECTED.  The SARS-CoV-2 RNA is generally detectable in upper and lower respiratory specimens during the acute phase of infection. Negative results do not preclude SARS-CoV-2 infection, do not rule out co-infections with other pathogens, and should not be used as the sole basis for treatment or other patient management decisions. Negative results must be combined with clinical observations, patient history, and  epidemiological information. The expected result is Negative.  Fact Sheet for Patients: SugarRoll.be  Fact Sheet for Healthcare Providers: https://www.woods-mathews.com/  This test is not yet approved or cleared by the [place] FDA and  has been authorized for detection and/or diagnosis of SARS-CoV-2 by FDA under an Emergency Use Authorization (EUA). This EUA will remain  in effect (meaning this test can be used) for the duration of the COVID-19 declaration under Se ction 564(b)(1) of the Act, 21 U.S.C. section 360bbb-3(b)(1), unless the authorization is terminated or revoked sooner.  Performed at [facility], [place] [address], [place], [place] [address]   SARS Coronavirus 2 by RT PCR (hospital order, performed in [facility] hospital lab) Nasopharyngeal Nasopharyngeal Swab     Status: None   Collection Time: 07/25/19 12:14 PM   Specimen: Nasopharyngeal Swab  Result Value Ref Range Status   SARS Coronavirus 2 NEGATIVE NEGATIVE Final     Comment: (NOTE) SARS-CoV-2 target nucleic acids are NOT DETECTED.  The SARS-CoV-2 RNA is generally detectable in upper and lower respiratory specimens during the acute phase of infection. The lowest concentration of SARS-CoV-2 viral copies this assay can detect is 250 copies / mL. A negative result does not preclude SARS-CoV-2 infection and should not be used as the sole basis for treatment or other patient management decisions.  A negative result may occur with improper specimen collection / handling, submission of specimen other than nasopharyngeal swab, presence of viral mutation(s) within the areas targeted by this assay, and inadequate number of viral copies (<250 copies / mL). A negative result must be combined with clinical observations, patient history, and epidemiological information.  Fact Sheet for Patients:   StrictlyIdeas.no  Fact Sheet for Healthcare Providers: BankingDealers.co.za  This test is not yet approved or  cleared by the [place] FDA and has been authorized for detection and/or diagnosis of SARS-CoV-2 by FDA under an Emergency Use Authorization (EUA).  This EUA will remain in effect (meaning this test can be used) for the duration of the COVID-19 declaration under Section 564(b)(1) of the Act, 21 U.S.C. section 360bbb-3(b)(1), unless the authorization is terminated or revoked sooner.  Performed at [facility], [place] [address] [place], [place] [address]          Radiology Studies: No results found.      Scheduled Meds: . apixaban  5 mg Oral BID  . aspirin EC  81 mg Oral Daily  . atorvastatin  20 mg Oral QHS  . budesonide (PULMICORT) nebulizer solution  0.25 mg Nebulization BID  . diltiazem  30 mg Oral Q6H  . memantine  28 mg Oral Daily   And  . donepezil  10 mg Oral QHS  . ferrous sulfate  325 mg Oral BID WC  . insulin aspart  0-9 Units Subcutaneous TID WC  . insulin glargine  10  Units Subcutaneous Daily  . metoprolol tartrate  75 mg Oral BID  . modafinil  100 mg Oral Daily  . pantoprazole  40 mg Oral Daily  . perphenazine  4 mg Oral TID  . QUEtiapine  100 mg Oral QHS  . sodium chloride flush  10-40 mL Intracatheter Q12H   Continuous Infusions:    LOS: 12 days   Phillips Climes, MD [facility]   If 7PM-7AM, please contact night-coverage [email or web address]  07/27/2019, 12:16 PM

## 2019-07-28 LAB — BASIC METABOLIC PANEL
Anion gap: 10 (ref 5–15)
BUN: 22 mg/dL (ref 8–23)
CO2: 30 mmol/L (ref 22–32)
Calcium: 8.9 mg/dL (ref 8.9–10.3)
Chloride: 101 mmol/L (ref 98–111)
Creatinine, Ser: 1.41 mg/dL — ABNORMAL HIGH (ref 0.44–1.00)
GFR calc Af Amer: 42 mL/min — ABNORMAL LOW (ref >60)
GFR calc non Af Amer: 36 mL/min — ABNORMAL LOW (ref >60)
Glucose, Bld: 83 mg/dL (ref 70–99)
Potassium: 3.4 mmol/L — ABNORMAL LOW (ref 3.5–5.1)
Sodium: 141 mmol/L (ref 135–145)

## 2019-07-28 LAB — GLUCOSE, CAPILLARY
Glucose-Capillary: 65 mg/dL — ABNORMAL LOW (ref 70–99)
Glucose-Capillary: 201 mg/dL — ABNORMAL HIGH (ref 70–99)
Glucose-Capillary: 113 mg/dL — ABNORMAL HIGH (ref 70–99)
Glucose-Capillary: 104 mg/dL — ABNORMAL HIGH (ref 70–99)

## 2019-07-28 MED ORDER — TORSEMIDE 20 MG PO TABS
10.0000 mg | ORAL_TABLET | Freq: Every day | ORAL | Status: DC
  Administered 2019-07-28 – 2019-07-31 (×4): 10 mg via ORAL
  Filled 2019-07-28 (×4): qty 1

## 2019-07-28 MED ORDER — INSULIN GLARGINE 100 UNIT/ML ~~LOC~~ SOLN
8.0000 [IU] | Freq: Every day | SUBCUTANEOUS | Status: DC
  Administered 2019-07-29 – 2019-07-31 (×3): 8 [IU] via SUBCUTANEOUS
  Filled 2019-07-28 (×3): qty 0.08

## 2019-07-28 NOTE — Progress Notes (Signed)
RT NOTE:  Plan is to leave Mrs. Hopkins off BIPAP tonight per Dr. Lovie Macadamia note 07/15.

## 2019-07-28 NOTE — TOC Progression Note (Signed)
Transition of Care [facility]) - Progression Note    Patient Details  Name: Cheryl [name] MRN: [id number] Date of Birth: 11-Nov-1940  Transition of Care [facility]) CM/SW [place], [place] Phone Number: 07/28/2019, 1:26 PM  Clinical Narrative:     CSW was informed by [facility] that it will be a couple weeks before Bipap machine will be received at facility. CSW reached out to [employer] to see if pt could get a temporary machine until the other is delivered.   CSW spoke to pts son Cheryl [name] via phone and gave update on plans. Cheryl [name] requested to be notified when pt is ready to d/c.     Expected Discharge Plan: [place] Barriers to Discharge: Continued Medical Work up  Expected Discharge Plan and Services Expected Discharge Plan: [place] arrangements for the past 2 months: [place]                                       Social Determinants of Health (SDOH) Interventions    Readmission Risk Interventions Readmission Risk Prevention Plan 05/10/2019  Transportation Screening Complete  PCP or Specialist Appt within 3-5 Days Complete  HRI or Home Care Consult Complete  Medication Review (RN Care Manager) Complete  Some recent data might be hidden   Emeterio Reeve, Latanya Presser, [place] Social Worker [phone]

## 2019-07-28 NOTE — Progress Notes (Signed)
PROGRESS NOTE    Cheryl [name]  ZOX:[id number] DOB: 1940/10/12 DOA: 07/14/2019 PCP: Letta Median, MD   Brief Narrative:  79 year old COPD, Systolic Heart EF 81-19 %, schizophrenia, CKD, recently discharge from [facility] treated for A flutter, COPD HF exacerbation, presents with worsening SOB, Respiratory failure. Patient admitted with acute hypoxic hypercapnic respiratory failure secondary to heart failure exacerbation, new ejection fraction at 35%.  She was treated with IV Lasix and was placed on BiPAP.  Her respiratory status subsequently improved.  Patient developed chest pain,  was transitioned to IV heparin from Eliquis.  Cardiology is not planning future intervention  Subjective: Patient seen and examined at bedside.  Denies any new complaints  Assessment & Plan:   Principal Problem:   Acute respiratory failure with hypoxia (HCC) Active Problems:   COPD exacerbation (HCC)   HTN (hypertension)   Dementia (HCC)   Persistent atrial fibrillation (HCC)   Acute on chronic systolic CHF (congestive heart failure) (HCC)   Acute hypoxic/hypercapneic respiratory failure/acute on chronic systolic CHF with EF 14%. -Appears to be multifactorial, due to acute on chronic systolic CHF with EF 78%, and A. fib with RVR, uncontrolled heart rate. -This appears to be significantly improved with IV diuresis, and heart rate control. -Appears to be euvolemic, she is resumed on low-dose torsemide 10 mg oral daily . -She has been using BiPAP at bedtime , has been with no respiratory distress, she is appropriately diuresed, I will try her off BiPAP this evening.  A flutter  RVR - TSH normal - Heart rate controlled - Continue oral Cardizem, metoprolol, Eliquis, I will go ahead and consolidate her Cardizem from 30 mg every 6 hours to Cardizem CD 120 mg oral daily. -She did require amiodarone initially, currently off amiodarone. -Cardiology has signed off.  Chest pain, epigastric pain EKG  changes. ST depression anterior lead.  Troponin trending downwards Cardiology on board, no further intervention  Diabetes mellitus type 2 SSI, Lantus, Accu-Cheks, hypoglycemic protocol  AKI on CKD stage IIIb Baseline creatinine around 1.3  Hold torsemide, but may resume upon discharge to SNF Daily BMP  -Potassium is 5, will give 1 dose of Lokelma  Hyperlipidemia Continue with statins.   History of schizophrenia Continue with xanax as needed and decreased seroquel  History of dementia She is more alert. Oriented to person only [facility] was contacted ([phone]), patient has developmental delay and she doesn't speak a lot, she will repeat what is said to her  History of lung cancer  S./p radiation    Estimated body mass index is 33.87 kg/m as calculated from the following:   Height as of this encounter: 5\' 4"  (1.626 m).   Weight as of this encounter: 89.5 kg.   DVT prophylaxis: Eliquis Code Status: Full code Family Communication: None at bedside Disposition Plan:  Status is: Inpatient  Remains inpatient appropriate because:Unsafe d/c plan (as awaiting BiPAP availability at the facility)   Dispo: The patient is from: ALF              Anticipated d/c is to: SNF              Anticipated d/c date is: 1 day              Patient currently is medically stable to d/c.  Still awaiting patient's BiPAP to be delivered prior to discharge     Consultants:   CCM  Cardiology   Procedures:   None  Antimicrobials:  None  Objective: Vitals:   07/27/19 2315 07/28/19 0340 07/28/19 0754 07/28/19 0830  BP:   134/79   Pulse: 78 80 64   Resp: 15 16 10    Temp:   97.8 F (36.6 C)   TempSrc:      SpO2:   98% 98%  Weight:      Height:        Intake/Output Summary (Last 24 hours) at 07/28/2019 1147 Last data filed at 07/28/2019 1137 Gross per 24 hour  Intake 120 ml  Output 2050 ml  Net -1930 ml   Filed Weights   07/24/19 0500 07/25/19 0500 07/26/19  0500  Weight: 88.3 kg 89.6 kg 89.5 kg    Examination:    Awake Alert, lying in bed in no apparent distress  symmetrical Chest wall movement, Good air movement bilaterally, CTAB RRR,No Gallops,Rubs or new Murmurs, No Parasternal Heave +ve B.Sounds, Abd Soft, No tenderness, No rebound - guarding or rigidity. No Cyanosis, Clubbing or edema, No new Rash or bruise       Data Reviewed: I have personally reviewed following labs and imaging studies  CBC: Recent Labs  Lab 07/27/19 0244  WBC 5.2  HGB 11.0*  HCT 37.4  MCV 90.6  PLT 272   Basic Metabolic Panel: Recent Labs  Lab 07/23/19 0336 07/24/19 0220 07/25/19 0339 07/27/19 0244 07/28/19 0222  NA 142 139 141 138 141  K 3.8 3.8 4.5 5.0 3.4*  CL 104 99 104 103 101  CO2 27 30 22 23 30   GLUCOSE 92 160* 105* 74 83  BUN 29* 29* 30* 31* 22  CREATININE 1.61* 1.62* 1.56* 1.62* 1.41*  CALCIUM 9.1 9.2 9.0 9.1 8.9   GFR: Estimated Creatinine Clearance: 36.2 mL/min (A) (by C-G formula based on SCr of 1.41 mg/dL (H)). Liver Function Tests: No results for input(s): AST, ALT, ALKPHOS, BILITOT, PROT, ALBUMIN in the last 168 hours. No results for input(s): LIPASE, AMYLASE in the last 168 hours. No results for input(s): AMMONIA in the last 168 hours. Coagulation Profile: No results for input(s): INR, PROTIME in the last 168 hours. Cardiac Enzymes: No results for input(s): CKTOTAL, CKMB, CKMBINDEX, TROPONINI in the last 168 hours. BNP (last 3 results) No results for input(s): PROBNP in the last 8760 hours. HbA1C: No results for input(s): HGBA1C in the last 72 hours. CBG: Recent Labs  Lab 07/27/19 0800 07/27/19 1210 07/27/19 1631 07/27/19 2100 07/28/19 0756  GLUCAP 102* 228* 102* 94 65*   Lipid Profile: No results for input(s): CHOL, HDL, LDLCALC, TRIG, CHOLHDL, LDLDIRECT in the last 72 hours. Thyroid Function Tests: No results for input(s): TSH, T4TOTAL, FREET4, T3FREE, THYROIDAB in the last 72 hours. Anemia Panel: No  results for input(s): VITAMINB12, FOLATE, FERRITIN, TIBC, IRON, RETICCTPCT in the last 72 hours. Sepsis Labs: No results for input(s): PROCALCITON, LATICACIDVEN in the last 168 hours.  Recent Results (from the past 240 hour(s))  SARS CORONAVIRUS 2 (TAT 6-24 HRS) Nasopharyngeal Nasopharyngeal Swab     Status: None   Collection Time: 07/19/19  3:24 PM   Specimen: Nasopharyngeal Swab  Result Value Ref Range Status   SARS Coronavirus 2 NEGATIVE NEGATIVE Final    Comment: (NOTE) SARS-CoV-2 target nucleic acids are NOT DETECTED.  The SARS-CoV-2 RNA is generally detectable in upper and lower respiratory specimens during the acute phase of infection. Negative results do not preclude SARS-CoV-2 infection, do not rule out co-infections with other pathogens, and should not be used as the sole basis for treatment or other patient management  decisions. Negative results must be combined with clinical observations, patient history, and epidemiological information. The expected result is Negative.  Fact Sheet for Patients: SugarRoll.be  Fact Sheet for Healthcare Providers: https://www.woods-mathews.com/  This test is not yet approved or cleared by the [place] FDA and  has been authorized for detection and/or diagnosis of SARS-CoV-2 by FDA under an Emergency Use Authorization (EUA). This EUA will remain  in effect (meaning this test can be used) for the duration of the COVID-19 declaration under Se ction 564(b)(1) of the Act, 21 U.S.C. section 360bbb-3(b)(1), unless the authorization is terminated or revoked sooner.  Performed at [facility], [place] [address] [place], [place] [address]   SARS Coronavirus 2 by RT PCR (hospital order, performed in [facility] hospital lab) Nasopharyngeal Nasopharyngeal Swab     Status: None   Collection Time: 07/25/19 12:14 PM   Specimen: Nasopharyngeal Swab  Result Value Ref Range Status   SARS Coronavirus  2 NEGATIVE NEGATIVE Final    Comment: (NOTE) SARS-CoV-2 target nucleic acids are NOT DETECTED.  The SARS-CoV-2 RNA is generally detectable in upper and lower respiratory specimens during the acute phase of infection. The lowest concentration of SARS-CoV-2 viral copies this assay can detect is 250 copies / mL. A negative result does not preclude SARS-CoV-2 infection and should not be used as the sole basis for treatment or other patient management decisions.  A negative result may occur with improper specimen collection / handling, submission of specimen other than nasopharyngeal swab, presence of viral mutation(s) within the areas targeted by this assay, and inadequate number of viral copies (<250 copies / mL). A negative result must be combined with clinical observations, patient history, and epidemiological information.  Fact Sheet for Patients:   StrictlyIdeas.no  Fact Sheet for Healthcare Providers: BankingDealers.co.za  This test is not yet approved or  cleared by the [place] FDA and has been authorized for detection and/or diagnosis of SARS-CoV-2 by FDA under an Emergency Use Authorization (EUA).  This EUA will remain in effect (meaning this test can be used) for the duration of the COVID-19 declaration under Section 564(b)(1) of the Act, 21 U.S.C. section 360bbb-3(b)(1), unless the authorization is terminated or revoked sooner.  Performed at [facility], [place] [address] [place], [place] [address]          Radiology Studies: No results found.      Scheduled Meds: . apixaban  5 mg Oral BID  . aspirin EC  81 mg Oral Daily  . atorvastatin  20 mg Oral QHS  . budesonide (PULMICORT) nebulizer solution  0.25 mg Nebulization BID  . diltiazem  120 mg Oral Daily  . memantine  28 mg Oral Daily   And  . donepezil  10 mg Oral QHS  . ferrous sulfate  325 mg Oral BID WC  . insulin aspart  0-9 Units Subcutaneous  TID WC  . insulin glargine  10 Units Subcutaneous Daily  . metoprolol tartrate  75 mg Oral BID  . modafinil  100 mg Oral Daily  . pantoprazole  40 mg Oral Daily  . perphenazine  4 mg Oral TID  . QUEtiapine  100 mg Oral QHS  . sodium chloride flush  10-40 mL Intracatheter Q12H  . torsemide  10 mg Oral Daily   Continuous Infusions:    LOS: 13 days   Phillips Climes, MD [facility]   If 7PM-7AM, please contact night-coverage [email or web address]  07/28/2019, 11:47 AM

## 2019-07-28 NOTE — Progress Notes (Signed)
Inpatient Diabetes Program Recommendations  AACE/ADA: New Consensus Statement on Inpatient Glycemic Control (2015)  Target Ranges:  Prepandial:   less than 140 mg/dL      Peak postprandial:   less than 180 mg/dL (1-2 hours)      Critically ill patients:  140 - 180 mg/dL   Lab Results  Component Value Date   GLUCAP 201 (H) 07/28/2019   HGBA1C 6.7 (H) 07/04/2019    Review of Glycemic Control Results for Cheryl [name], Cheryl [name] (MRN [id number]) as of 07/28/2019 12:40  Ref. Range 07/27/2019 12:10 07/27/2019 16:31 07/27/2019 21:00 07/28/2019 07:56 07/28/2019 11:56  Glucose-Capillary Latest Ref Range: 70 - 99 mg/dL 228 (H) 102 (H) 94 65 (L) 201 (H)    Inpatient Diabetes Program Recommendations:   Fasting CBG 65. Please consider: -Decrease Lantus to 8 units qd  Thank you, Cheryl [name]. Lynford Espinoza, RN, MSN, CDE  Diabetes Coordinator Inpatient Glycemic Control Team Team Pager [phone] (8am-5pm) 07/28/2019 12:41 PM

## 2019-07-29 LAB — GLUCOSE, CAPILLARY
Glucose-Capillary: 86 mg/dL (ref 70–99)
Glucose-Capillary: 131 mg/dL — ABNORMAL HIGH (ref 70–99)
Glucose-Capillary: 148 mg/dL — ABNORMAL HIGH (ref 70–99)
Glucose-Capillary: 212 mg/dL — ABNORMAL HIGH (ref 70–99)

## 2019-07-29 NOTE — Plan of Care (Signed)
  Problem: Activity: Goal: Ability to tolerate increased activity will improve Outcome: Progressing   Problem: Cardiac: Goal: Ability to achieve and maintain adequate cardiopulmonary perfusion will improve Outcome: Progressing   Problem: Education: Goal: Knowledge of General Education information will improve Description: Including pain rating scale, medication(s)/side effects and non-pharmacologic comfort measures Outcome: Progressing   Problem: Health Behavior/Discharge Planning: Goal: Ability to manage health-related needs will improve Outcome: Progressing   Problem: Clinical Measurements: Goal: Ability to maintain clinical measurements within normal limits will improve Outcome: Progressing Goal: Will remain free from infection Outcome: Progressing Goal: Diagnostic test results will improve Outcome: Progressing Goal: Respiratory complications will improve Outcome: Progressing Goal: Cardiovascular complication will be avoided Outcome: Progressing   Problem: Activity: Goal: Risk for activity intolerance will decrease Outcome: Progressing   Problem: Nutrition: Goal: Adequate nutrition will be maintained Outcome: Progressing   Problem: Coping: Goal: Level of anxiety will decrease Outcome: Progressing   Problem: Elimination: Goal: Will not experience complications related to bowel motility Outcome: Progressing Goal: Will not experience complications related to urinary retention Outcome: Progressing   Problem: Pain Managment: Goal: General experience of comfort will improve Outcome: Progressing   Problem: Safety: Goal: Ability to remain free from injury will improve Outcome: Progressing   Problem: Skin Integrity: Goal: Risk for impaired skin integrity will decrease Outcome: Progressing

## 2019-07-29 NOTE — Progress Notes (Signed)
PROGRESS NOTE    Cheryl [name]  ENI:[id number] DOB: [birth year] DOA: 07/14/2019 PCP: Letta Median, MD   Brief Narrative:  79 year old COPD, Systolic Heart EF 61-44 %, schizophrenia, CKD, recently discharge from [place] treated for A flutter, COPD HF exacerbation, presents with worsening SOB, Respiratory failure. Patient admitted with acute hypoxic hypercapnic respiratory failure secondary to heart failure exacerbation, new ejection fraction at 35%.  She was treated with IV Lasix and was placed on BiPAP.  Her respiratory status subsequently improved.  Patient developed chest pain,  was transitioned to IV heparin from Eliquis.  Cardiology is not planning future intervention  Subjective: Patient seen and examined at bedside.  Denies any new complaints, she slept well overnight with no BIPAP.  Assessment & Plan:   Principal Problem:   Acute respiratory failure with hypoxia (HCC) Active Problems:   COPD exacerbation (HCC)   HTN (hypertension)   Dementia (HCC)   Persistent atrial fibrillation (HCC)   Acute on chronic systolic CHF (congestive heart failure) (HCC)   Acute hypoxic/hypercapneic respiratory failure/acute on chronic systolic CHF with EF 31%. -Appears to be multifactorial, due to acute on chronic systolic CHF with EF 54%, and A. fib with RVR, uncontrolled heart rate. -This appears to be significantly improved with IV diuresis, and heart rate control. -Appears to be euvolemic, she is resumed on low-dose torsemide 10 mg oral daily . -Patient has been on BiPAP at bedtime since admission, now she is euvolemic, no evidence of volume overload, starting trial on room air at bedtime, so far she tolerated yesterday with no acute events, .  A flutter  RVR - TSH normal - Heart rate controlled - Continue oral Cardizem, metoprolol, Eliquis, I will go ahead and consolidate her Cardizem from 30 mg every 6 hours to Cardizem CD 120 mg oral daily. -She did require amiodarone initially,  currently off amiodarone. -Cardiology has signed off.  Chest pain, epigastric pain EKG changes. ST depression anterior lead.  Troponin trending downwards Cardiology on board, no further intervention  Diabetes mellitus type 2 SSI, Lantus, Accu-Cheks, hypoglycemic protocol  AKI on CKD stage IIIb Baseline creatinine around 1.3  Hold torsemide, but may resume upon discharge to SNF Daily BMP  -Potassium is 5, will give 1 dose of Lokelma  Hyperlipidemia Continue with statins.   History of schizophrenia Continue with xanax as needed and decreased seroquel  History of dementia She is more alert. Oriented to person only [facility] was contacted ([phone]), patient has developmental delay and she doesn't speak a lot, she will repeat what is said to her  History of lung cancer  S./p radiation    Estimated body mass index is 33.64 kg/m as calculated from the following:   Height as of this encounter: 5\' 4"  (1.626 m).   Weight as of this encounter: 88.9 kg.   DVT prophylaxis: Eliquis Code Status: Full code Family Communication: None at bedside Disposition Plan:  Status is: Inpatient  Remains inpatient appropriate because:Unsafe d/c plan (as awaiting BiPAP availability at the facility)   Dispo: The patient is from: ALF              Anticipated d/c is to: SNF              Anticipated d/c date is: 1 day              Patient currently is medically stable to d/c.  Still awaiting patient's BiPAP to be delivered prior to discharge     Consultants:  CCM  Cardiology   Procedures:   None  Antimicrobials:  None    Objective: Vitals:   07/29/19 0500 07/29/19 0600 07/29/19 0754 07/29/19 0851  BP:   (!) 141/78   Pulse:      Resp:   12   Temp:   98.5 F (36.9 C)   TempSrc:   Oral   SpO2:  97% 97% 97%  Weight: 88.9 kg     Height:        Intake/Output Summary (Last 24 hours) at 07/29/2019 1316 Last data filed at 07/29/2019 0900 Gross per 24 hour  Intake  --  Output 1100 ml  Net -1100 ml   Filed Weights   07/25/19 0500 07/26/19 0500 07/29/19 0500  Weight: 89.6 kg 89.5 kg 88.9 kg    Examination:    Awake, alert, laying in the bed, no apparent distress Symmetrical Chest wall movement, Good air movement bilaterally, CTAB RRR,No Gallops,Rubs or new Murmurs, No Parasternal Heave +ve B.Sounds, Abd Soft, No tenderness, No rebound - guarding or rigidity. No Cyanosis, Clubbing or edema, No new Rash or bruise    Data Reviewed: I have personally reviewed following labs and imaging studies  CBC: Recent Labs  Lab 07/27/19 0244  WBC 5.2  HGB 11.0*  HCT 37.4  MCV 90.6  PLT 364   Basic Metabolic Panel: Recent Labs  Lab 07/23/19 0336 07/24/19 0220 07/25/19 0339 07/27/19 0244 07/28/19 0222  NA 142 139 141 138 141  K 3.8 3.8 4.5 5.0 3.4*  CL 104 99 104 103 101  CO2 27 30 22 23 30   GLUCOSE 92 160* 105* 74 83  BUN 29* 29* 30* 31* 22  CREATININE 1.61* 1.62* 1.56* 1.62* 1.41*  CALCIUM 9.1 9.2 9.0 9.1 8.9   GFR: Estimated Creatinine Clearance: 36.1 mL/min (A) (by C-G formula based on SCr of 1.41 mg/dL (H)). Liver Function Tests: No results for input(s): AST, ALT, ALKPHOS, BILITOT, PROT, ALBUMIN in the last 168 hours. No results for input(s): LIPASE, AMYLASE in the last 168 hours. No results for input(s): AMMONIA in the last 168 hours. Coagulation Profile: No results for input(s): INR, PROTIME in the last 168 hours. Cardiac Enzymes: No results for input(s): CKTOTAL, CKMB, CKMBINDEX, TROPONINI in the last 168 hours. BNP (last 3 results) No results for input(s): PROBNP in the last 8760 hours. HbA1C: No results for input(s): HGBA1C in the last 72 hours. CBG: Recent Labs  Lab 07/28/19 1156 07/28/19 1558 07/28/19 2125 07/29/19 0723 07/29/19 1231  GLUCAP 201* 113* 104* 86 131*   Lipid Profile: No results for input(s): CHOL, HDL, LDLCALC, TRIG, CHOLHDL, LDLDIRECT in the last 72 hours. Thyroid Function Tests: No results for  input(s): TSH, T4TOTAL, FREET4, T3FREE, THYROIDAB in the last 72 hours. Anemia Panel: No results for input(s): VITAMINB12, FOLATE, FERRITIN, TIBC, IRON, RETICCTPCT in the last 72 hours. Sepsis Labs: No results for input(s): PROCALCITON, LATICACIDVEN in the last 168 hours.  Recent Results (from the past 240 hour(s))  SARS CORONAVIRUS 2 (TAT 6-24 HRS) Nasopharyngeal Nasopharyngeal Swab     Status: None   Collection Time: 07/19/19  3:24 PM   Specimen: Nasopharyngeal Swab  Result Value Ref Range Status   SARS Coronavirus 2 NEGATIVE NEGATIVE Final    Comment: (NOTE) SARS-CoV-2 target nucleic acids are NOT DETECTED.  The SARS-CoV-2 RNA is generally detectable in upper and lower respiratory specimens during the acute phase of infection. Negative results do not preclude SARS-CoV-2 infection, do not rule out co-infections with other pathogens, and should  not be used as the sole basis for treatment or other patient management decisions. Negative results must be combined with clinical observations, patient history, and epidemiological information. The expected result is Negative.  Fact Sheet for Patients: SugarRoll.be  Fact Sheet for Healthcare Providers: https://www.woods-mathews.com/  This test is not yet approved or cleared by the [place] FDA and  has been authorized for detection and/or diagnosis of SARS-CoV-2 by FDA under an Emergency Use Authorization (EUA). This EUA will remain  in effect (meaning this test can be used) for the duration of the COVID-19 declaration under Se ction 564(b)(1) of the Act, 21 U.S.C. section 360bbb-3(b)(1), unless the authorization is terminated or revoked sooner.  Performed at [facility], [place] [address], [place], [place] [address]   SARS Coronavirus 2 by RT PCR (hospital order, performed in [facility] hospital lab) Nasopharyngeal Nasopharyngeal Swab     Status: None   Collection Time: 07/25/19  12:14 PM   Specimen: Nasopharyngeal Swab  Result Value Ref Range Status   SARS Coronavirus 2 NEGATIVE NEGATIVE Final    Comment: (NOTE) SARS-CoV-2 target nucleic acids are NOT DETECTED.  The SARS-CoV-2 RNA is generally detectable in upper and lower respiratory specimens during the acute phase of infection. The lowest concentration of SARS-CoV-2 viral copies this assay can detect is 250 copies / mL. A negative result does not preclude SARS-CoV-2 infection and should not be used as the sole basis for treatment or other patient management decisions.  A negative result may occur with improper specimen collection / handling, submission of specimen other than nasopharyngeal swab, presence of viral mutation(s) within the areas targeted by this assay, and inadequate number of viral copies (<250 copies / mL). A negative result must be combined with clinical observations, patient history, and epidemiological information.  Fact Sheet for Patients:   StrictlyIdeas.no  Fact Sheet for Healthcare Providers: BankingDealers.co.za  This test is not yet approved or  cleared by the [place] FDA and has been authorized for detection and/or diagnosis of SARS-CoV-2 by FDA under an Emergency Use Authorization (EUA).  This EUA will remain in effect (meaning this test can be used) for the duration of the COVID-19 declaration under Section 564(b)(1) of the Act, 21 U.S.C. section 360bbb-3(b)(1), unless the authorization is terminated or revoked sooner.  Performed at [facility], [place] [address], [place], [place] [address]          Radiology Studies: No results found.      Scheduled Meds: . apixaban  5 mg Oral BID  . aspirin EC  81 mg Oral Daily  . atorvastatin  20 mg Oral QHS  . budesonide (PULMICORT) nebulizer solution  0.25 mg Nebulization BID  . diltiazem  120 mg Oral Daily  . memantine  28 mg Oral Daily   And  . donepezil  10  mg Oral QHS  . ferrous sulfate  325 mg Oral BID WC  . insulin aspart  0-9 Units Subcutaneous TID WC  . insulin glargine  8 Units Subcutaneous Daily  . metoprolol tartrate  75 mg Oral BID  . modafinil  100 mg Oral Daily  . pantoprazole  40 mg Oral Daily  . perphenazine  4 mg Oral TID  . QUEtiapine  100 mg Oral QHS  . sodium chloride flush  10-40 mL Intracatheter Q12H  . torsemide  10 mg Oral Daily   Continuous Infusions:    LOS: 14 days   Phillips Climes, MD [facility]   If  7PM-7AM, please contact night-coverage [email or web address]  07/29/2019, 1:16 PM

## 2019-07-29 NOTE — TOC Progression Note (Signed)
Transition of Care [facility]) - Progression Note    Patient Details  Name: Cheryl [name] MRN: [id number] Date of Birth: 03-04-1940  Transition of Care [facility]) CM/SW [place], [place] Phone Number: 07/29/2019, 12:53 PM  Clinical Narrative:     CSW spoke to pt son Tora Kindred gave CSW permission to reach out to other facilities in the area that may have a bipap machine.   Expected Discharge Plan: [place] Barriers to Discharge: Continued Medical Work up  Expected Discharge Plan and Services Expected Discharge Plan: [place] arrangements for the past 2 months: [place]                                       Social Determinants of Health (SDOH) Interventions    Readmission Risk Interventions Readmission Risk Prevention Plan 05/10/2019  Transportation Screening Complete  PCP or Specialist Appt within 3-5 Days Complete  HRI or Home Care Consult Complete  Medication Review (RN Care Manager) Complete  Some recent data might be hidden   Emeterio Reeve, Latanya Presser, [place] Social Worker [phone]

## 2019-07-29 NOTE — Progress Notes (Signed)
Occupational Therapy Treatment Patient Details Name: Cheryl [name] MRN: [id number] DOB: 05/20/1940 Today's Date: 07/29/2019    History of present illness Pt is 79 yo female with PMH of COPD, CHF, schizophrenia, dementia, and CKD.  Pt w/ recent d/c from hospital to SNF due to aflutter and COPD.  Pt now presents with worsening SHOB and resp failue.  Pt admitted with acute resp failure with hypoxia, a flutter/RVR; Chest -epigastric pain.   OT comments  Pt progressing with OT goals, pleasant and willing to work with therapy. Pt requires Min A at most for transfers and mobility with cues for safe RW use and to maintain dynamic standing balance. Pt Min A for grooming standing at sink with difficulty maintaining dynamic standing balance during bimanual task. Pt able to demonstrate other grooming tasks and UB ADLs seated at sink with Supervision, requiring cues for initiation only. Pt endorses "a little" SOB after activity, but VSS on RA.    Follow Up Recommendations  SNF;Supervision/Assistance - 24 hour    Equipment Recommendations  None recommended by OT    Recommendations for Other Services      Precautions / Restrictions Precautions Precautions: Fall Restrictions Weight Bearing Restrictions: No       Mobility Bed Mobility Overal bed mobility: Needs Assistance Bed Mobility: Supine to Sit;Sit to Supine     Supine to sit: Supervision Sit to supine: Supervision   General bed mobility comments: no physical assistance needed for bed mobility   Transfers Overall transfer level: Needs assistance Equipment used: Rolling walker (2 wheeled) Transfers: Sit to/from [facility] Sit to Stand: Supervision Stand pivot transfers: Min guard       General transfer comment: Supervision for sit to stand with no physical assistance needed. Pt min guard for safety in sequencing use of RW    Balance Overall balance assessment: Needs assistance Sitting-balance support: Feet  supported;No upper extremity supported Sitting balance-Leahy Scale: Good     Standing balance support: Bilateral upper extremity supported;During functional activity Standing balance-Leahy Scale: Fair Standing balance comment: static standing balance WFL, Min A needed at times during dynamic standing balance                           ADL either performed or assessed with clinical judgement   ADL Overall ADL's : Needs assistance/impaired     Grooming: Minimal assistance;Standing;Oral care;Brushing hair;Wash/dry face Grooming Details (indicate cue type and reason): Pt Min A for oral care due to minor difficulty with bimanual tasks unsupporting requiring assistance to maintain standing balance standing at sink. Pt progressed to supervision for washing face and combing hair seated at sink with cues needed for initiation only         Upper Body Dressing : Supervision/safety;Sitting Upper Body Dressing Details (indicate cue type and reason): Supervision to don hospital gown seated                  Functional mobility during ADLs: Minimal assistance;Cueing for safety;Cueing for sequencing;Rolling walker General ADL Comments: Pt limited by decreased endurance and cognition impacting overall safety     Vision   Vision Assessment?: No apparent visual deficits   Perception     Praxis      Cognition Arousal/Alertness: Awake/alert Behavior During Therapy: WFL for tasks assessed/performed Overall Cognitive Status: History of cognitive impairments - at baseline  General Comments: Pt folllows 1-2 step commands consistently, minor cues needed for initiation and consistent cues needed for safety        Exercises     Shoulder Instructions       General Comments HR WFL < 100bpm. Pt endorsed "a little" SOB after activity on RA. When walking to sink, pt with knee buckling, but on return to bed, gait smooth    Pertinent  Vitals/ Pain       Pain Assessment: Faces Faces Pain Scale: No hurt  Home Living                                          Prior Functioning/Environment              Frequency  Min 2X/week        Progress Toward Goals  OT Goals(current goals can now be found in the care plan section)  Progress towards OT goals: Progressing toward goals  Acute Rehab OT Goals Patient Stated Goal: none stated OT Goal Formulation: Patient unable to participate in goal setting Time For Goal Achievement: 08/01/19 Potential to Achieve Goals: Good ADL Goals Pt Will Perform Grooming: with supervision;standing Pt Will Perform Upper Body Bathing: with modified independence;sitting Pt Will Perform Upper Body Dressing: with supervision;sitting Pt Will Perform Lower Body Dressing: with min assist;sitting/lateral leans;sit to/from stand Pt Will Transfer to Toilet: with supervision;ambulating;regular height toilet Pt Will Perform Toileting - Clothing Manipulation and hygiene: with min assist;sit to/from stand Additional ADL Goal #1: Pt will incorporate pursed lip breathing with verbal cues while completing a seated ADL  Plan Discharge plan remains appropriate;Frequency remains appropriate    Co-evaluation                 AM-PAC OT "6 Clicks" Daily Activity     Outcome Measure   Help from another person eating meals?: None Help from another person taking care of personal grooming?: A Little Help from another person toileting, which includes using toliet, bedpan, or urinal?: A Lot Help from another person bathing (including washing, rinsing, drying)?: A Lot Help from another person to put on and taking off regular upper body clothing?: A Little Help from another person to put on and taking off regular lower body clothing?: A Little 6 Click Score: 17    End of Session Equipment Utilized During Treatment: Gait belt;Rolling walker  OT Visit Diagnosis: Other abnormalities of  gait and mobility (R26.89);Muscle weakness (generalized) (M62.81);Other symptoms and signs involving cognitive function   Activity Tolerance Patient tolerated treatment well   Patient Left in bed;with call bell/phone within reach;with bed alarm set   Nurse Communication Mobility status        Time: 1638-4665 OT Time Calculation (min): 22 min  Charges: OT General Charges $OT Visit: 1 Visit OT Treatments $Self Care/Home Management : 8-22 mins  Layla Maw, OTR/L   Layla Maw 07/29/2019, 2:50 PM

## 2019-07-30 DIAGNOSIS — I10 Essential (primary) hypertension: Secondary | ICD-10-CM

## 2019-07-30 LAB — GLUCOSE, CAPILLARY
Glucose-Capillary: 92 mg/dL (ref 70–99)
Glucose-Capillary: 108 mg/dL — ABNORMAL HIGH (ref 70–99)
Glucose-Capillary: 232 mg/dL — ABNORMAL HIGH (ref 70–99)
Glucose-Capillary: 137 mg/dL — ABNORMAL HIGH (ref 70–99)

## 2019-07-30 LAB — BASIC METABOLIC PANEL
Anion gap: 12 (ref 5–15)
BUN: 31 mg/dL — ABNORMAL HIGH (ref 8–23)
CO2: 30 mmol/L (ref 22–32)
Calcium: 8.9 mg/dL (ref 8.9–10.3)
Chloride: 97 mmol/L — ABNORMAL LOW (ref 98–111)
Creatinine, Ser: 1.65 mg/dL — ABNORMAL HIGH (ref 0.44–1.00)
GFR calc Af Amer: 34 mL/min — ABNORMAL LOW (ref >60)
GFR calc non Af Amer: 30 mL/min — ABNORMAL LOW (ref >60)
Glucose, Bld: 102 mg/dL — ABNORMAL HIGH (ref 70–99)
Potassium: 3.4 mmol/L — ABNORMAL LOW (ref 3.5–5.1)
Sodium: 139 mmol/L (ref 135–145)

## 2019-07-30 LAB — CBC
HCT: 38.9 % (ref 36.0–46.0)
Hemoglobin: 11.7 g/dL — ABNORMAL LOW (ref 12.0–15.0)
MCH: 26.6 pg (ref 26.0–34.0)
MCHC: 30.1 g/dL (ref 30.0–36.0)
MCV: 88.4 fL (ref 80.0–100.0)
Platelets: 330 10*3/uL (ref 150–400)
RBC: 4.4 MIL/uL (ref 3.87–5.11)
RDW: 14.7 % (ref 11.5–15.5)
WBC: 6.5 10*3/uL (ref 4.0–10.5)
nRBC: 0 % (ref 0.0–0.2)

## 2019-07-30 LAB — SARS CORONAVIRUS 2 (TAT 6-24 HRS): SARS Coronavirus 2: NEGATIVE

## 2019-07-30 MED ORDER — POTASSIUM CHLORIDE CRYS ER 20 MEQ PO TBCR
40.0000 meq | EXTENDED_RELEASE_TABLET | Freq: Once | ORAL | Status: AC
  Administered 2019-07-30: 40 meq via ORAL
  Filled 2019-07-30: qty 2

## 2019-07-30 NOTE — Progress Notes (Signed)
PROGRESS NOTE    Cheryl [name]  WNU:[id number] DOB: 02/24/1940 DOA: 07/14/2019 PCP: Letta Median, MD   Brief Narrative:  79 year old COPD, Systolic Heart EF 03-47 %, schizophrenia, CKD, recently discharge from [place] treated for A flutter, COPD HF exacerbation, presents with worsening SOB, Respiratory failure. Patient admitted with acute hypoxic hypercapnic respiratory failure secondary to heart failure exacerbation, new ejection fraction at 35%.  She was treated with IV Lasix and was placed on BiPAP.  Her respiratory status subsequently improved.  Patient developed chest pain,  was transitioned to IV heparin from Eliquis.  Cardiology is not planning future intervention  Subjective: Patient seen and examined at bedside.  Denies any new complaints, she slept well overnight with no BIPAP.  Assessment & Plan:   Principal Problem:   Acute respiratory failure with hypoxia (HCC) Active Problems:   COPD exacerbation (HCC)   HTN (hypertension)   Dementia (HCC)   Persistent atrial fibrillation (HCC)   Acute on chronic systolic CHF (congestive heart failure) (HCC)   Acute hypoxic/hypercapneic respiratory failure/acute on chronic systolic CHF with EF 42%. -Appears to be multifactorial, due to acute on chronic systolic CHF with EF 59%, and A. fib with RVR, uncontrolled heart rate. -This appears to be significantly improved with IV diuresis, and heart rate control. -Appears to be euvolemic, she is resumed on low-dose torsemide 10 mg oral daily . -Patient has been on BiPAP at bedtime since admission, but she has been euvolemic and appropriately diuresed, no further hypoxia, so she has been tried on room air at bedtime for last 48 hours with no recurrence of hypercapnia, will try another 24 hours off BiPAP, and if she tolerates that she should be stable for discharge.  A flutter  RVR - TSH normal - Heart rate controlled - Continue oral Cardizem, metoprolol, Eliquis, I will go ahead and  consolidate her Cardizem from 30 mg every 6 hours to Cardizem CD 120 mg oral daily. -She did require amiodarone initially, currently off amiodarone. -Cardiology has signed off.  Chest pain, epigastric pain EKG changes. ST depression anterior lead.  Troponin trending downwards Cardiology on board, no further intervention  Diabetes mellitus type 2 SSI, Lantus, Accu-Cheks, hypoglycemic protocol  AKI on CKD stage IIIb Baseline creatinine around 1.3  Hold torsemide, but may resume upon discharge to SNF Daily BMP  -Potassium is 5, will give 1 dose of Lokelma  Hyperlipidemia Continue with statins.   History of schizophrenia Continue with xanax as needed and decreased seroquel  History of dementia She is more alert. Oriented to person only [facility] was contacted [phone]), patient has developmental delay and she doesn't speak a lot, she will repeat what is said to her  History of lung cancer  S./p radiation    Estimated body mass index is 33.41 kg/m as calculated from the following:   Height as of this encounter: 5\' 4"  (1.626 m).   Weight as of this encounter: 88.3 kg.   DVT prophylaxis: Eliquis Code Status: Full code Family Communication: None at bedside Disposition Plan:  Status is: Inpatient  Remains inpatient appropriate because:Unsafe d/c plan (as awaiting BiPAP availability at the facility)   Dispo: The patient is from: ALF              Anticipated d/c is to: SNF              Anticipated d/c date is: 1 day              Patient  currently is medically stable to d/c.  Will try off BiPAP for another 24 hours, if she tolerates it then she can go to SNF tomorrow with no BiPAP requirement.   Consultants:   CCM  Cardiology   Procedures:   None  Antimicrobials:  None    Objective: Vitals:   07/30/19 0100 07/30/19 0500 07/30/19 0739 07/30/19 0742  BP:   (!) 153/118   Pulse:   65   Resp: 17 14 19    Temp:   97.8 F (36.6 C)   TempSrc:        SpO2:   100% 98%  Weight:  88.3 kg    Height:        Intake/Output Summary (Last 24 hours) at 07/30/2019 1139 Last data filed at 07/30/2019 0610 Gross per 24 hour  Intake --  Output 1650 ml  Net -1650 ml   Filed Weights   07/26/19 0500 07/29/19 0500 07/30/19 0500  Weight: 89.5 kg 88.9 kg 88.3 kg    Examination:    Awake, alert, laying in the bed, speech is repetitive (at baseline), no apparent distress Symmetrical Chest wall movement, Good air movement bilaterally, CTAB RRR,No Gallops,Rubs or new Murmurs, No Parasternal Heave +ve B.Sounds, Abd Soft, No tenderness, No rebound - guarding or rigidity. No Cyanosis, Clubbing or edema, No new Rash or bruise     Data Reviewed: I have personally reviewed following labs and imaging studies  CBC: Recent Labs  Lab 07/27/19 0244 07/30/19 0255  WBC 5.2 6.5  HGB 11.0* 11.7*  HCT 37.4 38.9  MCV 90.6 88.4  PLT 328 409   Basic Metabolic Panel: Recent Labs  Lab 07/24/19 0220 07/25/19 0339 07/27/19 0244 07/28/19 0222 07/30/19 0255  NA 139 141 138 141 139  K 3.8 4.5 5.0 3.4* 3.4*  CL 99 104 103 101 97*  CO2 30 22 23 30 30   GLUCOSE 160* 105* 74 83 102*  BUN 29* 30* 31* 22 31*  CREATININE 1.62* 1.56* 1.62* 1.41* 1.65*  CALCIUM 9.2 9.0 9.1 8.9 8.9   GFR: Estimated Creatinine Clearance: 30.7 mL/min (A) (by C-G formula based on SCr of 1.65 mg/dL (H)). Liver Function Tests: No results for input(s): AST, ALT, ALKPHOS, BILITOT, PROT, ALBUMIN in the last 168 hours. No results for input(s): LIPASE, AMYLASE in the last 168 hours. No results for input(s): AMMONIA in the last 168 hours. Coagulation Profile: No results for input(s): INR, PROTIME in the last 168 hours. Cardiac Enzymes: No results for input(s): CKTOTAL, CKMB, CKMBINDEX, TROPONINI in the last 168 hours. BNP (last 3 results) No results for input(s): PROBNP in the last 8760 hours. HbA1C: No results for input(s): HGBA1C in the last 72 hours. CBG: Recent Labs  Lab  07/29/19 0723 07/29/19 1231 07/29/19 1610 07/29/19 2105 07/30/19 0827  GLUCAP 86 131* 148* 212* 92   Lipid Profile: No results for input(s): CHOL, HDL, LDLCALC, TRIG, CHOLHDL, LDLDIRECT in the last 72 hours. Thyroid Function Tests: No results for input(s): TSH, T4TOTAL, FREET4, T3FREE, THYROIDAB in the last 72 hours. Anemia Panel: No results for input(s): VITAMINB12, FOLATE, FERRITIN, TIBC, IRON, RETICCTPCT in the last 72 hours. Sepsis Labs: No results for input(s): PROCALCITON, LATICACIDVEN in the last 168 hours.  Recent Results (from the past 240 hour(s))  SARS Coronavirus 2 by RT PCR (hospital order, performed in [facility] hospital lab) Nasopharyngeal Nasopharyngeal Swab     Status: None   Collection Time: 07/25/19 12:14 PM   Specimen: Nasopharyngeal Swab  Result Value Ref Range Status  SARS Coronavirus 2 NEGATIVE NEGATIVE Final    Comment: (NOTE) SARS-CoV-2 target nucleic acids are NOT DETECTED.  The SARS-CoV-2 RNA is generally detectable in upper and lower respiratory specimens during the acute phase of infection. The lowest concentration of SARS-CoV-2 viral copies this assay can detect is 250 copies / mL. A negative result does not preclude SARS-CoV-2 infection and should not be used as the sole basis for treatment or other patient management decisions.  A negative result may occur with improper specimen collection / handling, submission of specimen other than nasopharyngeal swab, presence of viral mutation(s) within the areas targeted by this assay, and inadequate number of viral copies (<250 copies / mL). A negative result must be combined with clinical observations, patient history, and epidemiological information.  Fact Sheet for Patients:   StrictlyIdeas.no  Fact Sheet for Healthcare Providers: BankingDealers.co.za  This test is not yet approved or  cleared by the [place] FDA and has been authorized for  detection and/or diagnosis of SARS-CoV-2 by FDA under an Emergency Use Authorization (EUA).  This EUA will remain in effect (meaning this test can be used) for the duration of the COVID-19 declaration under Section 564(b)(1) of the Act, 21 U.S.C. section 360bbb-3(b)(1), unless the authorization is terminated or revoked sooner.  Performed at [facility], [place] [address], [place], [place] [address]   SARS CORONAVIRUS 2 (TAT 6-24 HRS) Nasopharyngeal Nasopharyngeal Swab     Status: None   Collection Time: 07/29/19  1:16 PM   Specimen: Nasopharyngeal Swab  Result Value Ref Range Status   SARS Coronavirus 2 NEGATIVE NEGATIVE Final    Comment: (NOTE) SARS-CoV-2 target nucleic acids are NOT DETECTED.  The SARS-CoV-2 RNA is generally detectable in upper and lower respiratory specimens during the acute phase of infection. Negative results do not preclude SARS-CoV-2 infection, do not rule out co-infections with other pathogens, and should not be used as the sole basis for treatment or other patient management decisions. Negative results must be combined with clinical observations, patient history, and epidemiological information. The expected result is Negative.  Fact Sheet for Patients: SugarRoll.be  Fact Sheet for Healthcare Providers: https://www.woods-mathews.com/  This test is not yet approved or cleared by the [place] FDA and  has been authorized for detection and/or diagnosis of SARS-CoV-2 by FDA under an Emergency Use Authorization (EUA). This EUA will remain  in effect (meaning this test can be used) for the duration of the COVID-19 declaration under Se ction 564(b)(1) of the Act, 21 U.S.C. section 360bbb-3(b)(1), unless the authorization is terminated or revoked sooner.  Performed at [facility], [place] [address], [place], [place] [address]          Radiology Studies: No results found.      Scheduled  Meds: . apixaban  5 mg Oral BID  . aspirin EC  81 mg Oral Daily  . atorvastatin  20 mg Oral QHS  . budesonide (PULMICORT) nebulizer solution  0.25 mg Nebulization BID  . diltiazem  120 mg Oral Daily  . memantine  28 mg Oral Daily   And  . donepezil  10 mg Oral QHS  . ferrous sulfate  325 mg Oral BID WC  . insulin aspart  0-9 Units Subcutaneous TID WC  . insulin glargine  8 Units Subcutaneous Daily  . metoprolol tartrate  75 mg Oral BID  . modafinil  100 mg Oral Daily  . pantoprazole  40 mg Oral Daily  . perphenazine  4 mg Oral  TID  . QUEtiapine  100 mg Oral QHS  . sodium chloride flush  10-40 mL Intracatheter Q12H  . torsemide  10 mg Oral Daily   Continuous Infusions:    LOS: 15 days   Phillips Climes, MD [facility]   If 7PM-7AM, please contact night-coverage [email or web address]  07/30/2019, 11:39 AM

## 2019-07-30 NOTE — Procedures (Signed)
Plan is to leave Ms. Dumont off BIPAP tonight per Dr. Lovie Macadamia note from 07/17.

## 2019-07-30 NOTE — Progress Notes (Signed)
RT NOTE:  Plan is to leave Mrs. Witherow off BIPAP tonight per Dr. Lovie Macadamia note 07/16.

## 2019-07-30 NOTE — TOC Progression Note (Signed)
Transition of Care [facility] - Progression Note    Patient Details  Name: Cheryl [name] MRN: [id number] Date of Birth: 1940/06/15  Transition of Care [facility]) CM/SW [place], [place] Phone Number: 07/30/2019, 4:27 PM  Clinical Narrative:    CSW spoke with MD and was informed patient was without a BiPAP last night and did well. MD will see how patient does without the BiPAP tonight and plans to discharge patient tomorrow if determined to be stable.   CSW contacted patient's son Cheryl [name] to provide an update. Cheryl [name] is in agreement with SNF placement back to [facility].   CSW spoke with Olivia Mackie of [facility] to provide an update of patient's potential discharge Sunday. CSW confirmed patient is able to discharge to facility Sunday if it is determined a Bipap is not needed. CSW will continue to follow.   Expected Discharge Plan: Skilled Nursing Facility Barriers to Discharge: Continued Medical Work up  Expected Discharge Plan and Services Expected Discharge Plan: [place]       Living arrangements for the past 2 months: [place]                                       Social Determinants of Health (SDOH) Interventions    Readmission Risk Interventions Readmission Risk Prevention Plan 05/10/2019  Transportation Screening Complete  PCP or Specialist Appt within 3-5 Days Complete  HRI or Home Care Consult Complete  Medication Review (RN Care Manager) Complete  Some recent data might be hidden

## 2019-07-31 LAB — GLUCOSE, CAPILLARY
Glucose-Capillary: 92 mg/dL (ref 70–99)
Glucose-Capillary: 175 mg/dL — ABNORMAL HIGH (ref 70–99)

## 2019-07-31 MED ORDER — QUETIAPINE FUMARATE 100 MG PO TABS: 100.0000 mg | ORAL_TABLET | Freq: Every day | ORAL | Status: DC

## 2019-07-31 MED ORDER — METOPROLOL TARTRATE 75 MG PO TABS: 75.0000 mg | ORAL_TABLET | Freq: Two times a day (BID) | ORAL | Status: DC

## 2019-07-31 MED ORDER — ALPRAZOLAM 0.25 MG PO TABS: 0.2500 mg | ORAL_TABLET | Freq: Two times a day (BID) | ORAL | 0 refills | Status: DC

## 2019-07-31 MED ORDER — ASPIRIN 81 MG PO TBEC: 81.0000 mg | DELAYED_RELEASE_TABLET | Freq: Every day | ORAL | 11 refills | Status: DC

## 2019-07-31 MED ORDER — INSULIN ASPART 100 UNIT/ML ~~LOC~~ SOLN: 0.0000 [IU] | Freq: Three times a day (TID) | SUBCUTANEOUS | 11 refills | Status: DC

## 2019-07-31 MED ORDER — TORSEMIDE 10 MG PO TABS: 10.0000 mg | ORAL_TABLET | Freq: Every day | ORAL | Status: DC

## 2019-07-31 MED ORDER — DILTIAZEM HCL ER COATED BEADS 120 MG PO CP24: 120.0000 mg | ORAL_CAPSULE | Freq: Every day | ORAL | Status: DC

## 2019-07-31 MED ORDER — APIXABAN 5 MG PO TABS: 5.0000 mg | ORAL_TABLET | Freq: Two times a day (BID) | ORAL | Status: AC

## 2019-07-31 NOTE — Progress Notes (Signed)
[facility] to give report. Waiting on transport for discharge.

## 2019-07-31 NOTE — Discharge Summary (Signed)
Cheryl [name], is a 79 y.o. female  DOB 1940/08/04  MRN [id number].  Admission date:  07/14/2019  Admitting Physician  Rise Patience, MD  Discharge Date:  07/31/2019   Primary MD  Letta Median, MD  Recommendations for primary care physician for things to follow:  - please check CBC,CMP in 3 days   Admission Diagnosis  COPD exacerbation ([place]) [J44.1] Acute respiratory failure with hypoxia ([place]) [J96.01]   Discharge Diagnosis  COPD exacerbation ([place]) [J44.1] Acute respiratory failure with hypoxia ([place]) [J96.01]    Principal Problem:   Acute respiratory failure with hypoxia ([place]) Active Problems:   COPD exacerbation ([place])   HTN (hypertension)   Dementia ([place])   Persistent atrial fibrillation (HCC)   Acute on chronic systolic CHF (congestive heart failure) ([place])      Past Medical History:  Diagnosis Date  . COPD (chronic obstructive pulmonary disease) ([place])   . Dementia ([place])   . Diabetes mellitus without complication ([place])   . GERD (gastroesophageal reflux disease)   . Hyperlipidemia   . Hypertension   . Lung cancer ([place])   . Mental retardation   . Schizophrenia [facility])     Past Surgical History:  Procedure Laterality Date  . ABDOMINAL HYSTERECTOMY         History of present illness and  Hospital Course:     Kindly see H&P for history of present illness and admission details, please review complete Labs, Consult reports and Test reports for all details in brief  HPI  from the history and physical done on the day of admission 07/14/2019  HPI: [name] is a 79 y.o. female with history of COPD, systolic heart failure last EF measured last month was 30 to 35% schizophrenia, chronic kidney disease stage III A. fib who was admitted and discharged yesterday from [facility] during which patient had a prolonged: Patient was admitted for shortness of  breath went into atrial flutter with RVR was started on amiodarone and went into bradycardia and pauses electrophysiology cardiology was consulted eventually was felt that patient did not need any pacemaker and was restarted on Toprol discharge yesterday was brought in the ER the patient became acutely short of breath.  No further history is available since patient cannot provide history and patient signed and unable to be reached.  ED Course: In the ER patient is hypoxic and found to be wheezing bilaterally chest x-ray showing features concerning for CHF labs are remarkable for BNP of 1600 creatinine increase from 1.3-1.5 blood glucose of 221 hemoglobin stable at 11 high sensitive troponin at 41 and 53.  ABG showed pH of 7.31 PCO2 of 60 Covid test was negative and since patient became more hypoxic despite nebulizer treatment patient had to be placed on BiPAP Lasix 20 mg IV was given patient admitted for acute respiratory failure secondary to COPD and CHF.  EKG shows tachycardia.   Hospital Course   Acute hypoxic/hypercapneic respiratory failure/acute on chronic systolic CHF with EF 13%. -Appears to be multifactorial,  due to acute on chronic systolic CHF with EF 16%, and A. fib with RVR, uncontrolled heart rate. -This appears to be significantly improved with IV diuresis, and heart rate control. -Appears to be euvolemic, she is resumed on low-dose torsemide 10 mg oral daily . -She did require continuous BiPAP initially on admission, then she did require it at bedtime, she currently has been weaned off BiPAP totally, she had last 72 hours with absolutely no BiPAP requirement, no hypoxia, no respiratory distress .  There is no indication for BiPAP on discharge.  A flutter  RVR - TSH normal - Heart rate controlled - Continue oral Cardizem, metoprolol, Eliquis, did consolidate her Cardizem from 30 mg every 6 hours to Cardizem CD 120 mg oral daily. -She did require amiodarone initially, currently off  amiodarone.  Chest pain, epigastric pain EKG changes. ST depression anterior lead.  Troponin trending downwards Cardiology on board, no further intervention  Diabetes mellitus type 2 SSI, Lantus, Accu-Cheks,  AKI on CKD stage IIIb Baseline creatinine around 1.3 , it is 1.6 on discharge  Hyperlipidemia Continue with statins.   History of schizophrenia Continue with xanax as needed and decreased seroquel  History of dementia She is more alert. Oriented to person only [facility] was contacted ([phone]), patient has developmental delay and she doesn't speak a lot, she will repeat what is said to her   History of lung cancer  S./p radiation    Estimated body mass index is 33.41 kg/m as calculated from the following:   Height as of this encounter: 5\' 4"  (1.626 m).   Weight as of this encounter: 88.3 kg.    Discharge Condition:  Stable    Discharge Instructions  and  Discharge Medications     Discharge Instructions    Diet - low sodium heart healthy   Complete by: As directed    Discharge instructions   Complete by: As directed    Follow with SNF MD  Get CBC, CMP,  checked  by Primary MD next visit.    Activity: As tolerated with Full fall precautions use walker/cane & assistance as needed   Disposition SNF   Diet: Heart Healthy /carb modified with 1500 cc fluid restrictions  For Heart failure patients - Check your Weight same time everyday, if you gain over 2 pounds, or you develop in leg swelling, experience more shortness of breath or chest pain, call your Primary MD immediately. Follow Cardiac Low Salt Diet and 1.5 lit/day fluid restriction.   On your next visit with your primary care physician please Get Medicines reviewed and adjusted.   Please request your Prim.MD to go over all Hospital Tests and Procedure/Radiological results at the follow up, please get all Hospital records sent to your Prim MD by signing hospital release  before you go home.   If you experience worsening of your admission symptoms, develop shortness of breath, life threatening emergency, suicidal or homicidal thoughts you must seek medical attention immediately by calling 911 or calling your MD immediately  if symptoms less severe.  You Must read complete instructions/literature along with all the possible adverse reactions/side effects for all the Medicines you take and that have been prescribed to you. Take any new Medicines after you have completely understood and accpet all the possible adverse reactions/side effects.   Do not drive, operating heavy machinery, perform activities at heights, swimming or participation in water activities or provide baby sitting services if your were admitted for syncope or siezures until you  have seen by Primary MD or a Neurologist and advised to do so again.  Do not drive when taking Pain medications.    Do not take more than prescribed Pain, Sleep and Anxiety Medications  Special Instructions: If you have smoked or chewed Tobacco  in the last 2 yrs please stop smoking, stop any regular Alcohol  and or any Recreational drug use.  Wear Seat belts while driving.   Please note  You were cared for by a hospitalist during your hospital stay. If you have any questions about your discharge medications or the care you received while you were in the hospital after you are discharged, you can call the unit and asked to speak with the hospitalist on call if the hospitalist that took care of you is not available. Once you are discharged, your primary care physician will handle any further medical issues. Please note that NO REFILLS for any discharge medications will be authorized once you are discharged, as it is imperative that you return to your primary care physician (or establish a relationship with a primary care physician if you do not have one) for your aftercare needs so that they can reassess your need for  medications and monitor your lab values.   Increase activity slowly   Complete by: As directed    No wound care   Complete by: As directed      Allergies as of 07/31/2019   No Known Allergies     Medication List    STOP taking these medications   aspirin 325 MG tablet Replaced by: aspirin 81 MG EC tablet   metoprolol succinate 50 MG 24 hr tablet Commonly known as: Toprol XL   rivaroxaban 20 MG Tabs tablet Commonly known as: XARELTO     TAKE these medications   acetaminophen 325 MG tablet Commonly known as: TYLENOL Take 650 mg by mouth every 4 (four) hours as needed for mild pain or fever.   Advair Diskus 250-50 MCG/DOSE Aepb Generic drug: Fluticasone-Salmeterol Inhale 1 puff into the lungs 2 (two) times daily.   ALPRAZolam 0.25 MG tablet Commonly known as: XANAX Take 1 tablet (0.25 mg total) by mouth 2 (two) times daily.   apixaban 5 MG Tabs tablet Commonly known as: ELIQUIS Take 1 tablet (5 mg total) by mouth 2 (two) times daily.   aspirin 81 MG EC tablet Take 1 tablet (81 mg total) by mouth daily. Swallow whole. Start taking on: August 01, 2019 Replaces: aspirin 325 MG tablet   atorvastatin 20 MG tablet Commonly known as: LIPITOR Take 20 mg by mouth at bedtime.   diltiazem 120 MG 24 hr capsule Commonly known as: CARDIZEM CD Take 1 capsule (120 mg total) by mouth daily. Start taking on: August 01, 2019   ferrous sulfate 325 (65 FE) MG tablet Take 325 mg by mouth 2 (two) times daily with a meal.   fluticasone 50 MCG/ACT nasal spray Commonly known as: FLONASE Place 2 sprays into both nostrils daily.   insulin aspart 100 UNIT/ML injection Commonly known as: novoLOG Inject 0-9 Units into the skin 3 (three) times daily with meals.   insulin glargine 100 UNIT/ML injection Commonly known as: LANTUS Inject 0.1 mLs (10 Units total) into the skin daily.   levalbuterol 0.63 MG/3ML nebulizer solution Commonly known as: XOPENEX Take 3 mLs (0.63 mg total) by  nebulization every 8 (eight) hours.   magnesium hydroxide 400 MG/5ML suspension Commonly known as: MILK OF MAGNESIA Take 30 mLs by mouth daily as  needed for moderate constipation.   Metoprolol Tartrate 75 MG Tabs Take 75 mg by mouth 2 (two) times daily.   modafinil 100 MG tablet Commonly known as: PROVIGIL Take 1 tablet (100 mg total) by mouth daily.   Namzaric 28-10 MG Cp24 Generic drug: Memantine HCl-Donepezil HCl Take 1 capsule by mouth daily.   omeprazole 40 MG capsule Commonly known as: PRILOSEC Take 40 mg by mouth daily.   perphenazine 4 MG tablet Commonly known as: TRILAFON Take 1 tablet (4 mg total) by mouth 3 (three) times daily.   QUEtiapine 100 MG tablet Commonly known as: SEROQUEL Take 1 tablet (100 mg total) by mouth at bedtime. What changed:   medication strength  how much to take   torsemide 10 MG tablet Commonly known as: DEMADEX Take 1 tablet (10 mg total) by mouth daily. Start taking on: August 01, 2019         Diet and Activity recommendation: See Discharge Instructions above   Consults obtained -  PCCM Cardiolgoy   Major procedures and Radiology Reports - PLEASE review detailed and final reports for all details, in brief -     DG Chest 2 View  Result Date: 07/03/2019 CLINICAL DATA:  Shortness of breath and hypoxia. History of lung cancer post radiation. EXAM: CHEST - 2 VIEW COMPARISON:  05/09/2019 and CT 05/09/2019 FINDINGS: Patient slightly rotated to the left. Lungs are adequately inflated without focal airspace consolidation, effusion or pneumothorax. Known left perihilar scarring in this patient with history of lung cancer post radiation. Mild stable cardiomegaly. Remainder of the exam is unchanged. Minimal linear scarring over the left upper lung. IMPRESSION: 1.  No acute findings. 2. Mild stable cardiomegaly. Stable scarring over the left hilar region in this patient with history of lung cancer post radiation. Electronically Signed    By: Marin Olp M.D.   On: 07/03/2019 11:01   CT Angio Chest PE W and/or Wo Contrast  Result Date: 07/03/2019 CLINICAL DATA:  Shortness of breath.  Wheezing.  Tachycardia. EXAM: CT ANGIOGRAPHY CHEST WITH CONTRAST TECHNIQUE: Multidetector CT imaging of the chest was performed using the standard protocol during bolus administration of intravenous contrast. Multiplanar CT image reconstructions and MIPs were obtained to evaluate the vascular anatomy. CONTRAST:  38mL OMNIPAQUE IOHEXOL 350 MG/ML SOLN COMPARISON:  CT scan dated 05/09/2019 FINDINGS: Cardiovascular: Satisfactory opacification of the pulmonary arteries to the segmental level. No evidence of pulmonary embolism. Borderline cardiomegaly. Trace pericardial effusion. Aortic atherosclerosis. Scattered coronary artery calcifications. Mediastinum/Nodes: There is bronchomalacia of the mainstem bronchi bilaterally. Trachea appears normal. Esophagus is normal. Large chronic hiatal hernia. Lungs/Pleura: Stable parenchymal scarring anteromedially in the right hemithorax and in the left perihilar region and at the left lung base, unchanged. Tiny new right effusion. Upper Abdomen: No acute abnormality. Stable 17 mm low-density lesion in the right lobe of the liver consistent with a cyst. Musculoskeletal: No chest wall abnormality. No acute or significant osseous findings. Review of the MIP images confirms the above findings. IMPRESSION: 1. No pulmonary emboli. 2. Bronchomalacia of mainstem bronchi bilaterally. Both mainstem bronchi are markedly narrowed. 3. Tiny new right effusion. 4. Stable bilateral parenchymal scarring. 5. Large chronic hiatal hernia. 6.  Aortic Atherosclerosis (ICD10-I70.0). Aortic Atherosclerosis (ICD10-I70.0). Electronically Signed   By: Lorriane Shire M.D.   On: 07/03/2019 13:05   DG Chest Port 1 View  Result Date: 07/15/2019 CLINICAL DATA:  Short of breath, diaphoretic, tachypnea EXAM: PORTABLE CHEST 1 VIEW COMPARISON:  07/03/2019 FINDINGS:  Single frontal view of the  chest demonstrates a stable cardiac silhouette. There is chronic scarring of the left hilum. No acute airspace disease or pneumothorax. Minimal blunting of the costophrenic angles consistent with trace bilateral pleural effusions. No acute bony abnormalities. IMPRESSION: 1. Trace bilateral pleural effusions. 2. No acute airspace disease. 3. Chronic scarring left hilum. Electronically Signed   By: Randa Ngo M.D.   On: 07/15/2019 00:47   ECHOCARDIOGRAM COMPLETE  Result Date: 07/04/2019    ECHOCARDIOGRAM REPORT   Patient Name:   RAECHAL RABEN Date of Exam: 07/04/2019 Medical Rec #:  [id number]      Height:       64.0 in Accession #:    [id number]     Weight:       205.8 lb Date of Birth:  [birth year]      BSA:          1.980 m Patient Age:    89 years       BP:           134/87 mmHg Patient Gender: F              HR:           124 bpm. Exam Location:  [facility] Procedure: Color Doppler and Cardiac Doppler Indications:     I50.31 CHF-Acute Diastolic  History:         Patient has no prior history of Echocardiogram examinations.                  COPD; Risk Factors:Hypertension, Dyslipidemia and Diabetes.  Sonographer:     Charmayne Sheer RDCS (AE) Referring Phys:  Baker Janus Soledad Gerlach NIU Diagnosing Phys: Kathlyn Sacramento MD  Sonographer Comments: Suboptimal apical window and suboptimal subcostal window. Image acquisition challenging due to COPD. IMPRESSIONS  1. Left ventricular ejection fraction, by estimation, is 30 to 35%. The left ventricle has moderately decreased function. The left ventricle demonstrates global hypokinesis. There is mild left ventricular hypertrophy. Left ventricular diastolic parameters are indeterminate.  2. Right ventricular systolic function is mildly reduced. The right ventricular size is mildly enlarged. There is mildly elevated pulmonary artery systolic pressure.  3. Left atrial size was mildly dilated.  4. Right atrial size was moderately dilated.  5. The mitral valve is normal  in structure. Trivial mitral valve regurgitation. No evidence of mitral stenosis.  6. Tricuspid valve regurgitation is moderate.  7. The aortic valve is normal in structure. Aortic valve regurgitation is trivial. Mild aortic valve sclerosis is present, with no evidence of aortic valve stenosis.  8. Moderately dilated pulmonary artery. FINDINGS  Left Ventricle: Left ventricular ejection fraction, by estimation, is 30 to 35%. The left ventricle has moderately decreased function. The left ventricle demonstrates global hypokinesis. The left ventricular internal cavity size was normal in size. There is mild left ventricular hypertrophy. Left ventricular diastolic parameters are indeterminate. Right Ventricle: The right ventricular size is mildly enlarged. No increase in right ventricular wall thickness. Right ventricular systolic function is mildly reduced. There is mildly elevated pulmonary artery systolic pressure. The tricuspid regurgitant  velocity is 2.95 m/s, and with an assumed right atrial pressure of 10 mmHg, the estimated right ventricular systolic pressure is 17.6 mmHg. Left Atrium: Left atrial size was mildly dilated. Right Atrium: Right atrial size was moderately dilated. Pericardium: There is no evidence of pericardial effusion. Mitral Valve: The mitral valve is normal in structure. Normal mobility of the mitral valve leaflets. Mild mitral annular calcification. Trivial mitral valve regurgitation. No evidence of mitral valve stenosis.  MV peak gradient, 6.0 mmHg. The mean mitral valve gradient is 3.0 mmHg. Tricuspid Valve: The tricuspid valve is normal in structure. Tricuspid valve regurgitation is moderate . No evidence of tricuspid stenosis. Aortic Valve: The aortic valve is normal in structure. Aortic valve regurgitation is trivial. Mild aortic valve sclerosis is present, with no evidence of aortic valve stenosis. Aortic valve mean gradient measures 2.0 mmHg. Aortic valve peak gradient measures 2.9 mmHg.  Aortic valve area, by VTI measures 2.98 cm. Pulmonic Valve: The pulmonic valve was normal in structure. Pulmonic valve regurgitation is mild. No evidence of pulmonic stenosis. Aorta: The aortic root is normal in size and structure. Pulmonary Artery: The pulmonary artery is moderately dilated. Venous: The inferior vena cava was not well visualized. IAS/Shunts: No atrial level shunt detected by color flow Doppler.  LEFT VENTRICLE PLAX 2D LVIDd:         4.31 cm  Diastology LVIDs:         3.75 cm  LV e' lateral:   8.38 cm/s LV PW:         1.25 cm  LV E/e' lateral: 14.8 LV IVS:        1.02 cm  LV e' medial:    5.11 cm/s LVOT diam:     2.00 cm  LV E/e' medial:  24.3 LV SV:         35 LV SV Index:   18 LVOT Area:     3.14 cm  RIGHT VENTRICLE RV Basal diam:  4.15 cm LEFT ATRIUM             Index       RIGHT ATRIUM           Index LA diam:        3.60 cm 1.82 cm/m  RA Area:     25.90 cm LA Vol (A2C):   29.1 ml 14.69 ml/m RA Volume:   91.70 ml  46.31 ml/m LA Vol (A4C):   49.7 ml 25.10 ml/m LA Biplane Vol: 40.1 ml 20.25 ml/m  AORTIC VALVE                   PULMONIC VALVE AV Area (Vmax):    2.84 cm    PV Vmax:       0.66 m/s AV Area (Vmean):   2.71 cm    PV Vmean:      50.800 cm/s AV Area (VTI):     2.98 cm    PV VTI:        0.087 m AV Vmax:           84.60 cm/s  PV Peak grad:  1.8 mmHg AV Vmean:          61.400 cm/s PV Mean grad:  1.0 mmHg AV VTI:            0.117 m AV Peak Grad:      2.9 mmHg AV Mean Grad:      2.0 mmHg LVOT Vmax:         76.50 cm/s LVOT Vmean:        53.000 cm/s LVOT VTI:          0.111 m LVOT/AV VTI ratio: 0.95  AORTA Ao Root diam: 3.60 cm MITRAL VALVE                TRICUSPID VALVE MV Area (PHT): 6.88 cm     TR Peak grad:   34.8 mmHg MV Peak grad:  6.0 mmHg  TR Vmax:        295.00 cm/s MV Mean grad:  3.0 mmHg MV Vmax:       1.22 m/s     SHUNTS MV Vmean:      81.6 cm/s    Systemic VTI:  0.11 m MV Decel Time: 110 msec     Systemic Diam: 2.00 cm MV E velocity: 124.00 cm/s Kathlyn Sacramento MD  Electronically signed by Kathlyn Sacramento MD Signature Date/Time: 07/04/2019/12:31:41 PM    Final    [place] EKG SITE RITE  Result Date: 07/08/2019 If Site Rite image not attached, placement could not be confirmed due to current cardiac rhythm.   Micro Results    Recent Results (from the past 240 hour(s))  SARS Coronavirus 2 by RT PCR (hospital order, performed in [facility] hospital lab) Nasopharyngeal Nasopharyngeal Swab     Status: None   Collection Time: 07/25/19 12:14 PM   Specimen: Nasopharyngeal Swab  Result Value Ref Range Status   SARS Coronavirus 2 NEGATIVE NEGATIVE Final    Comment: (NOTE) SARS-CoV-2 target nucleic acids are NOT DETECTED.  The SARS-CoV-2 RNA is generally detectable in upper and lower respiratory specimens during the acute phase of infection. The lowest concentration of SARS-CoV-2 viral copies this assay can detect is 250 copies / mL. A negative result does not preclude SARS-CoV-2 infection and should not be used as the sole basis for treatment or other patient management decisions.  A negative result may occur with improper specimen collection / handling, submission of specimen other than nasopharyngeal swab, presence of viral mutation(s) within the areas targeted by this assay, and inadequate number of viral copies (<250 copies / mL). A negative result must be combined with clinical observations, patient history, and epidemiological information.  Fact Sheet for Patients:   StrictlyIdeas.no  Fact Sheet for Healthcare Providers: BankingDealers.co.za  This test is not yet approved or  cleared by the [place] FDA and has been authorized for detection and/or diagnosis of SARS-CoV-2 by FDA under an Emergency Use Authorization (EUA).  This EUA will remain in effect (meaning this test can be used) for the duration of the COVID-19 declaration under Section 564(b)(1) of the Act, 21 U.S.C. section 360bbb-3(b)(1),  unless the authorization is terminated or revoked sooner.  Performed at [facility], [place] [address], [place], [place] [address]   SARS CORONAVIRUS 2 (TAT 6-24 HRS) Nasopharyngeal Nasopharyngeal Swab     Status: None   Collection Time: 07/29/19  1:16 PM   Specimen: Nasopharyngeal Swab  Result Value Ref Range Status   SARS Coronavirus 2 NEGATIVE NEGATIVE Final    Comment: (NOTE) SARS-CoV-2 target nucleic acids are NOT DETECTED.  The SARS-CoV-2 RNA is generally detectable in upper and lower respiratory specimens during the acute phase of infection. Negative results do not preclude SARS-CoV-2 infection, do not rule out co-infections with other pathogens, and should not be used as the sole basis for treatment or other patient management decisions. Negative results must be combined with clinical observations, patient history, and epidemiological information. The expected result is Negative.  Fact Sheet for Patients: SugarRoll.be  Fact Sheet for Healthcare Providers: https://www.woods-mathews.com/  This test is not yet approved or cleared by the [place] FDA and  has been authorized for detection and/or diagnosis of SARS-CoV-2 by FDA under an Emergency Use Authorization (EUA). This EUA will remain  in effect (meaning this test can be used) for the duration of the COVID-19 declaration under Se ction 564(b)(1) of the Act, 21  U.S.C. section 360bbb-3(b)(1), unless the authorization is terminated or revoked sooner.  Performed at [facility], [place] [address], [place], [place] [address]        Today   Subjective:   Delphia Kaylor today with no significant events overnight as discussed with staff, no issues overnight of her sleep, she did not require BiPAP for last 72 hours, she finished all of her breakfast.  Objective:   Blood pressure 135/82, pulse 65, temperature 98.7 F (37.1 C), resp. rate 19, height 5\' 4"  (1.626  m), weight 88 kg, SpO2 98 %.   Intake/Output Summary (Last 24 hours) at 07/31/2019 1102 Last data filed at 07/31/2019 0600 Gross per 24 hour  Intake --  Output 1250 ml  Net -1250 ml    Exam  Awake, sniffing into diminished cognition and insight, laying in the bed, speech is repetitive (at baseline), no apparent distress Symmetrical Chest wall movement, Good air movement bilaterally, CTAB RRR,No Gallops,Rubs or new Murmurs, No Parasternal Heave +ve B.Sounds, Abd Soft, No tenderness, No rebound - guarding or rigidity. No Cyanosis, Clubbing or edema, No new Rash or bruise    Data Review   CBC w Diff:  Lab Results  Component Value Date   WBC 6.5 07/30/2019   HGB 11.7 (L) 07/30/2019   HGB 12.3 12/15/2013   HCT 38.9 07/30/2019   HCT 38.1 12/15/2013   PLT 330 07/30/2019   PLT 303 12/15/2013   LYMPHOPCT 33 07/21/2019   LYMPHOPCT 30.8 12/15/2013   MONOPCT 11 07/21/2019   MONOPCT 7.5 12/15/2013   EOSPCT 5 07/21/2019   EOSPCT 5.3 12/15/2013   BASOPCT 1 07/21/2019   BASOPCT 0.7 12/15/2013    CMP:  Lab Results  Component Value Date   NA 139 07/30/2019   NA 144 12/15/2013   K 3.4 (L) 07/30/2019   K 3.8 12/15/2013   CL 97 (L) 07/30/2019   CL 106 12/15/2013   CO2 30 07/30/2019   CO2 30 12/15/2013   BUN 31 (H) 07/30/2019   BUN 19 (H) 12/15/2013   CREATININE 1.65 (H) 07/30/2019   CREATININE 1.04 12/15/2013   PROT 8.1 05/10/2019   PROT 7.5 12/15/2013   ALBUMIN 3.6 05/10/2019   ALBUMIN 3.7 12/15/2013   BILITOT 0.6 05/10/2019   BILITOT 0.1 (L) 12/15/2013   ALKPHOS 84 05/10/2019   ALKPHOS 94 12/15/2013   AST 18 05/10/2019   AST 14 (L) 12/15/2013   ALT 15 05/10/2019   ALT 23 12/15/2013  .   Total Time in preparing paper work, data evaluation and todays exam - 57 minutes  Phillips Climes M.D on 07/31/2019 at 11:02 AM  [facility]   Office  [phone]

## 2019-07-31 NOTE — TOC Transition Note (Addendum)
Transition of Care [facility]) - CM/SW Discharge Note   Patient Details  Name: Cheryl [name] MRN: [id number] Date of Birth: 04/20/1940  Transition of Care [facility]) CM/SW Contact:  Jacquelynn Cree Phone Number: 07/31/2019, 11:11 AM   Clinical Narrative:    Patient will DC to: [facility] Transport by: Corey Harold  RN, patient, and facility notified of DC. Discharge Summary and FL2 sent to facility. Attempted to contact son. RN to call report prior to discharge [phone] [facility]). DC packet on chart. Ambulance transport requested for patient.   CSW will sign off for now as social work intervention is no longer needed. Please consult [place] again if new needs arise.    Final next level of care: Skilled Nursing Facility Barriers to Discharge: No Barriers Identified   Patient Goals and CMS Choice   CMS Medicare.gov Compare Post Acute Care list provided to:: Patient Represenative (must comment) Richardson Landry) Choice offered to / list presented to : Adult Children  Discharge Placement              Patient chooses bed at: [facility] Patient to be transferred to facility by: [place] Name of family member notified: Richardson Landry Patient and family notified of of transfer: 07/31/19  Discharge Plan and Services                                     Social Determinants of Health (SDOH) Interventions     Readmission Risk Interventions Readmission Risk Prevention Plan 05/10/2019  Transportation Screening Complete  PCP or Specialist Appt within 3-5 Days Complete  HRI or Home Care Consult Complete  Medication Review (RN Care Manager) Complete  Some recent data might be hidden

## 2019-08-12 ENCOUNTER — Inpatient Hospital Stay (HOSPITAL_COMMUNITY)
Admission: EM | Admit: 2019-08-12 | Discharge: 2019-08-16 | DRG: 291 | Disposition: A | Discharge status: Skilled Nursing Facility | Payer: Medicare Other | Source: Skilled Nursing Facility | Attending: Family Medicine | Admitting: Family Medicine

## 2019-08-12 ENCOUNTER — Emergency Department (HOSPITAL_COMMUNITY): Payer: Medicare Other

## 2019-08-12 ENCOUNTER — Other Ambulatory Visit: Payer: Self-pay

## 2019-08-12 ENCOUNTER — Emergency Department (HOSPITAL_COMMUNITY)
Admission: EM | Admit: 2019-08-12 | Discharge: 2019-08-12 | Disposition: A | Discharge status: Home / Self Care | Payer: Medicare Other | Source: Home / Self Care | Attending: Emergency Medicine | Admitting: Emergency Medicine

## 2019-08-12 DIAGNOSIS — I130 Hypertensive heart and chronic kidney disease with heart failure and stage 1 through stage 4 chronic kidney disease, or unspecified chronic kidney disease: Secondary | ICD-10-CM | POA: Diagnosis present

## 2019-08-12 DIAGNOSIS — Z79899 Other long term (current) drug therapy: Secondary | ICD-10-CM

## 2019-08-12 DIAGNOSIS — I471 Supraventricular tachycardia: Secondary | ICD-10-CM | POA: Diagnosis not present

## 2019-08-12 DIAGNOSIS — Z923 Personal history of irradiation: Secondary | ICD-10-CM | POA: Diagnosis not present

## 2019-08-12 DIAGNOSIS — Z85118 Personal history of other malignant neoplasm of bronchus and lung: Secondary | ICD-10-CM

## 2019-08-12 DIAGNOSIS — F319 Bipolar disorder, unspecified: Secondary | ICD-10-CM | POA: Diagnosis present

## 2019-08-12 DIAGNOSIS — R488 Other symbolic dysfunctions: Secondary | ICD-10-CM | POA: Diagnosis present

## 2019-08-12 DIAGNOSIS — E1122 Type 2 diabetes mellitus with diabetic chronic kidney disease: Secondary | ICD-10-CM | POA: Diagnosis present

## 2019-08-12 DIAGNOSIS — I5043 Acute on chronic combined systolic (congestive) and diastolic (congestive) heart failure: Secondary | ICD-10-CM | POA: Diagnosis not present

## 2019-08-12 DIAGNOSIS — J9602 Acute respiratory failure with hypercapnia: Secondary | ICD-10-CM | POA: Diagnosis present

## 2019-08-12 DIAGNOSIS — I483 Typical atrial flutter: Secondary | ICD-10-CM | POA: Diagnosis not present

## 2019-08-12 DIAGNOSIS — K219 Gastro-esophageal reflux disease without esophagitis: Secondary | ICD-10-CM | POA: Diagnosis present

## 2019-08-12 DIAGNOSIS — Z794 Long term (current) use of insulin: Secondary | ICD-10-CM

## 2019-08-12 DIAGNOSIS — F0390 Unspecified dementia without behavioral disturbance: Secondary | ICD-10-CM | POA: Diagnosis present

## 2019-08-12 DIAGNOSIS — Z66 Do not resuscitate: Secondary | ICD-10-CM | POA: Diagnosis not present

## 2019-08-12 DIAGNOSIS — E662 Morbid (severe) obesity with alveolar hypoventilation: Secondary | ICD-10-CM | POA: Diagnosis present

## 2019-08-12 DIAGNOSIS — Z7951 Long term (current) use of inhaled steroids: Secondary | ICD-10-CM

## 2019-08-12 DIAGNOSIS — J9601 Acute respiratory failure with hypoxia: Secondary | ICD-10-CM | POA: Diagnosis present

## 2019-08-12 DIAGNOSIS — I509 Heart failure, unspecified: Secondary | ICD-10-CM

## 2019-08-12 DIAGNOSIS — I4510 Unspecified right bundle-branch block: Secondary | ICD-10-CM | POA: Diagnosis present

## 2019-08-12 DIAGNOSIS — I10 Essential (primary) hypertension: Secondary | ICD-10-CM | POA: Diagnosis present

## 2019-08-12 DIAGNOSIS — I5023 Acute on chronic systolic (congestive) heart failure: Secondary | ICD-10-CM | POA: Diagnosis present

## 2019-08-12 DIAGNOSIS — E785 Hyperlipidemia, unspecified: Secondary | ICD-10-CM | POA: Diagnosis present

## 2019-08-12 DIAGNOSIS — J9600 Acute respiratory failure, unspecified whether with hypoxia or hypercapnia: Secondary | ICD-10-CM | POA: Diagnosis present

## 2019-08-12 DIAGNOSIS — J42 Unspecified chronic bronchitis: Secondary | ICD-10-CM

## 2019-08-12 DIAGNOSIS — I4892 Unspecified atrial flutter: Secondary | ICD-10-CM | POA: Diagnosis not present

## 2019-08-12 DIAGNOSIS — C3492 Malignant neoplasm of unspecified part of left bronchus or lung: Secondary | ICD-10-CM | POA: Diagnosis present

## 2019-08-12 DIAGNOSIS — E1129 Type 2 diabetes mellitus with other diabetic kidney complication: Secondary | ICD-10-CM | POA: Diagnosis present

## 2019-08-12 DIAGNOSIS — Z87891 Personal history of nicotine dependence: Secondary | ICD-10-CM

## 2019-08-12 DIAGNOSIS — J189 Pneumonia, unspecified organism: Secondary | ICD-10-CM

## 2019-08-12 DIAGNOSIS — Z7982 Long term (current) use of aspirin: Secondary | ICD-10-CM

## 2019-08-12 DIAGNOSIS — Z7901 Long term (current) use of anticoagulants: Secondary | ICD-10-CM

## 2019-08-12 DIAGNOSIS — C3490 Malignant neoplasm of unspecified part of unspecified bronchus or lung: Secondary | ICD-10-CM

## 2019-08-12 DIAGNOSIS — Z7189 Other specified counseling: Secondary | ICD-10-CM

## 2019-08-12 DIAGNOSIS — F209 Schizophrenia, unspecified: Secondary | ICD-10-CM | POA: Diagnosis present

## 2019-08-12 DIAGNOSIS — Z6834 Body mass index (BMI) 34.0-34.9, adult: Secondary | ICD-10-CM | POA: Diagnosis not present

## 2019-08-12 DIAGNOSIS — I480 Paroxysmal atrial fibrillation: Secondary | ICD-10-CM | POA: Diagnosis present

## 2019-08-12 DIAGNOSIS — I495 Sick sinus syndrome: Secondary | ICD-10-CM | POA: Diagnosis present

## 2019-08-12 DIAGNOSIS — Z515 Encounter for palliative care: Secondary | ICD-10-CM | POA: Diagnosis not present

## 2019-08-12 DIAGNOSIS — N179 Acute kidney failure, unspecified: Secondary | ICD-10-CM | POA: Diagnosis present

## 2019-08-12 DIAGNOSIS — N1831 Chronic kidney disease, stage 3a: Secondary | ICD-10-CM | POA: Diagnosis present

## 2019-08-12 DIAGNOSIS — Z90710 Acquired absence of both cervix and uterus: Secondary | ICD-10-CM

## 2019-08-12 DIAGNOSIS — F79 Unspecified intellectual disabilities: Secondary | ICD-10-CM | POA: Diagnosis present

## 2019-08-12 DIAGNOSIS — J449 Chronic obstructive pulmonary disease, unspecified: Secondary | ICD-10-CM | POA: Diagnosis present

## 2019-08-12 DIAGNOSIS — Z20822 Contact with and (suspected) exposure to covid-19: Secondary | ICD-10-CM | POA: Diagnosis present

## 2019-08-12 HISTORY — DX: Chronic kidney disease, stage 3 unspecified: N18.30

## 2019-08-12 HISTORY — DX: Unspecified atrial flutter: I48.92

## 2019-08-12 HISTORY — DX: Type 2 diabetes mellitus with other specified complication: E66.9

## 2019-08-12 HISTORY — DX: Sick sinus syndrome: I49.5

## 2019-08-12 HISTORY — DX: Cardiomyopathy, unspecified: I42.9

## 2019-08-12 HISTORY — DX: Type 2 diabetes mellitus with other specified complication: E11.69

## 2019-08-12 LAB — I-STAT ARTERIAL BLOOD GAS, ED
Acid-Base Excess: 10 mmol/L — ABNORMAL HIGH (ref 0.0–2.0)
Acid-Base Excess: 9 mmol/L — ABNORMAL HIGH (ref 0.0–2.0)
Acid-Base Excess: 12 mmol/L — ABNORMAL HIGH (ref 0.0–2.0)
Acid-Base Excess: 11 mmol/L — ABNORMAL HIGH (ref 0.0–2.0)
Acid-Base Excess: 11 mmol/L — ABNORMAL HIGH (ref 0.0–2.0)
Bicarbonate: 38.2 mmol/L — ABNORMAL HIGH (ref 20.0–28.0)
Bicarbonate: 37.4 mmol/L — ABNORMAL HIGH (ref 20.0–28.0)
Bicarbonate: 39.5 mmol/L — ABNORMAL HIGH (ref 20.0–28.0)
Bicarbonate: 37.6 mmol/L — ABNORMAL HIGH (ref 20.0–28.0)
Bicarbonate: 37.7 mmol/L — ABNORMAL HIGH (ref 20.0–28.0)
Calcium, Ion: 1.21 mmol/L (ref 1.15–1.40)
Calcium, Ion: 1.21 mmol/L (ref 1.15–1.40)
Calcium, Ion: 1.2 mmol/L (ref 1.15–1.40)
Calcium, Ion: 1.19 mmol/L (ref 1.15–1.40)
Calcium, Ion: 1.2 mmol/L (ref 1.15–1.40)
HCT: 34 % — ABNORMAL LOW (ref 36.0–46.0)
HCT: 33 % — ABNORMAL LOW (ref 36.0–46.0)
HCT: 34 % — ABNORMAL LOW (ref 36.0–46.0)
HCT: 34 % — ABNORMAL LOW (ref 36.0–46.0)
HCT: 34 % — ABNORMAL LOW (ref 36.0–46.0)
Hemoglobin: 11.6 g/dL — ABNORMAL LOW (ref 12.0–15.0)
Hemoglobin: 11.2 g/dL — ABNORMAL LOW (ref 12.0–15.0)
Hemoglobin: 11.6 g/dL — ABNORMAL LOW (ref 12.0–15.0)
Hemoglobin: 11.6 g/dL — ABNORMAL LOW (ref 12.0–15.0)
Hemoglobin: 11.6 g/dL — ABNORMAL LOW (ref 12.0–15.0)
O2 Saturation: 99 %
O2 Saturation: 99 %
O2 Saturation: 96 %
O2 Saturation: 95 %
O2 Saturation: 97 %
Patient temperature: 97.7
Patient temperature: 98.7
Patient temperature: 98.7
Potassium: 3.8 mmol/L (ref 3.5–5.1)
Potassium: 4 mmol/L (ref 3.5–5.1)
Potassium: 3.8 mmol/L (ref 3.5–5.1)
Potassium: 3.7 mmol/L (ref 3.5–5.1)
Potassium: 3.6 mmol/L (ref 3.5–5.1)
Sodium: 145 mmol/L (ref 135–145)
Sodium: 145 mmol/L (ref 135–145)
Sodium: 144 mmol/L (ref 135–145)
Sodium: 145 mmol/L (ref 135–145)
Sodium: 144 mmol/L (ref 135–145)
TCO2: 40 mmol/L — ABNORMAL HIGH (ref 22–32)
TCO2: 40 mmol/L — ABNORMAL HIGH (ref 22–32)
TCO2: 42 mmol/L — ABNORMAL HIGH (ref 22–32)
TCO2: 39 mmol/L — ABNORMAL HIGH (ref 22–32)
TCO2: 39 mmol/L — ABNORMAL HIGH (ref 22–32)
pCO2 arterial: 69.7 mmHg (ref 32.0–48.0)
pCO2 arterial: 73.5 mmHg (ref 32.0–48.0)
pCO2 arterial: 68.1 mmHg (ref 32.0–48.0)
pCO2 arterial: 59.5 mmHg — ABNORMAL HIGH (ref 32.0–48.0)
pCO2 arterial: 58.6 mmHg — ABNORMAL HIGH (ref 32.0–48.0)
pH, Arterial: 7.345 — ABNORMAL LOW (ref 7.350–7.450)
pH, Arterial: 7.315 — ABNORMAL LOW (ref 7.350–7.450)
pH, Arterial: 7.372 (ref 7.350–7.450)
pH, Arterial: 7.409 (ref 7.350–7.450)
pH, Arterial: 7.416 (ref 7.350–7.450)
pO2, Arterial: 138 mmHg — ABNORMAL HIGH (ref 83.0–108.0)
pO2, Arterial: 154 mmHg — ABNORMAL HIGH (ref 83.0–108.0)
pO2, Arterial: 91 mmHg (ref 83.0–108.0)
pO2, Arterial: 76 mmHg — ABNORMAL LOW (ref 83.0–108.0)
pO2, Arterial: 95 mmHg (ref 83.0–108.0)

## 2019-08-12 LAB — COMPREHENSIVE METABOLIC PANEL
ALT: 63 U/L — ABNORMAL HIGH (ref 0–44)
ALT: 63 U/L — ABNORMAL HIGH (ref 0–44)
AST: 54 U/L — ABNORMAL HIGH (ref 15–41)
AST: 50 U/L — ABNORMAL HIGH (ref 15–41)
Albumin: 3.2 g/dL — ABNORMAL LOW (ref 3.5–5.0)
Albumin: 3.3 g/dL — ABNORMAL LOW (ref 3.5–5.0)
Alkaline Phosphatase: 87 U/L (ref 38–126)
Alkaline Phosphatase: 95 U/L (ref 38–126)
Anion gap: 13 (ref 5–15)
Anion gap: 10 (ref 5–15)
BUN: 14 mg/dL (ref 6–20)
BUN: 15 mg/dL (ref 8–23)
CO2: 29 mmol/L (ref 22–32)
CO2: 33 mmol/L — ABNORMAL HIGH (ref 22–32)
Calcium: 8.6 mg/dL — ABNORMAL LOW (ref 8.9–10.3)
Calcium: 9.1 mg/dL (ref 8.9–10.3)
Chloride: 101 mmol/L (ref 98–111)
Chloride: 102 mmol/L (ref 98–111)
Creatinine, Ser: 1.18 mg/dL — ABNORMAL HIGH (ref 0.44–1.00)
Creatinine, Ser: 1.15 mg/dL — ABNORMAL HIGH (ref 0.44–1.00)
GFR calc Af Amer: 58 mL/min — ABNORMAL LOW (ref >60)
GFR calc Af Amer: 53 mL/min — ABNORMAL LOW (ref >60)
GFR calc non Af Amer: 50 mL/min — ABNORMAL LOW (ref >60)
GFR calc non Af Amer: 46 mL/min — ABNORMAL LOW (ref >60)
Glucose, Bld: 135 mg/dL — ABNORMAL HIGH (ref 70–99)
Glucose, Bld: 154 mg/dL — ABNORMAL HIGH (ref 70–99)
Potassium: 4 mmol/L (ref 3.5–5.1)
Potassium: 4.2 mmol/L (ref 3.5–5.1)
Sodium: 143 mmol/L (ref 135–145)
Sodium: 145 mmol/L (ref 135–145)
Total Bilirubin: 0.7 mg/dL (ref 0.3–1.2)
Total Bilirubin: 0.8 mg/dL (ref 0.3–1.2)
Total Protein: 6.6 g/dL (ref 6.5–8.1)
Total Protein: 7 g/dL (ref 6.5–8.1)

## 2019-08-12 LAB — CBC WITH DIFFERENTIAL/PLATELET
Abs Immature Granulocytes: 0.02 10*3/uL (ref 0.00–0.07)
Abs Immature Granulocytes: 0.04 10*3/uL (ref 0.00–0.07)
Basophils Absolute: 0 10*3/uL (ref 0.0–0.1)
Basophils Absolute: 0 10*3/uL (ref 0.0–0.1)
Basophils Relative: 1 %
Basophils Relative: 1 %
Eosinophils Absolute: 0 10*3/uL (ref 0.0–0.5)
Eosinophils Absolute: 0 10*3/uL (ref 0.0–0.5)
Eosinophils Relative: 0 %
Eosinophils Relative: 0 %
HCT: 34.9 % — ABNORMAL LOW (ref 36.0–46.0)
HCT: 36.2 % (ref 36.0–46.0)
Hemoglobin: 10.2 g/dL — ABNORMAL LOW (ref 12.0–15.0)
Hemoglobin: 10.4 g/dL — ABNORMAL LOW (ref 12.0–15.0)
Immature Granulocytes: 0 %
Immature Granulocytes: 1 %
Lymphocytes Relative: 19 %
Lymphocytes Relative: 10 %
Lymphs Abs: 1.5 10*3/uL (ref 0.7–4.0)
Lymphs Abs: 0.6 10*3/uL — ABNORMAL LOW (ref 0.7–4.0)
MCH: 27.3 pg (ref 26.0–34.0)
MCH: 27 pg (ref 26.0–34.0)
MCHC: 29.2 g/dL — ABNORMAL LOW (ref 30.0–36.0)
MCHC: 28.7 g/dL — ABNORMAL LOW (ref 30.0–36.0)
MCV: 93.3 fL (ref 80.0–100.0)
MCV: 94 fL (ref 80.0–100.0)
Monocytes Absolute: 0.7 10*3/uL (ref 0.1–1.0)
Monocytes Absolute: 0.2 10*3/uL (ref 0.1–1.0)
Monocytes Relative: 9 %
Monocytes Relative: 2 %
Neutro Abs: 5.3 10*3/uL (ref 1.7–7.7)
Neutro Abs: 5.5 10*3/uL (ref 1.7–7.7)
Neutrophils Relative %: 71 %
Neutrophils Relative %: 86 %
Platelets: 286 10*3/uL (ref 150–400)
Platelets: 272 10*3/uL (ref 150–400)
RBC: 3.74 MIL/uL — ABNORMAL LOW (ref 3.87–5.11)
RBC: 3.85 MIL/uL — ABNORMAL LOW (ref 3.87–5.11)
RDW: 14.6 % (ref 11.5–15.5)
RDW: 14.6 % (ref 11.5–15.5)
WBC: 7.6 10*3/uL (ref 4.0–10.5)
WBC: 6.3 10*3/uL (ref 4.0–10.5)
nRBC: 0 % (ref 0.0–0.2)
nRBC: 0 % (ref 0.0–0.2)

## 2019-08-12 LAB — URINE CULTURE: Culture: NO GROWTH

## 2019-08-12 LAB — BRAIN NATRIURETIC PEPTIDE
B Natriuretic Peptide: 1406.5 pg/mL — ABNORMAL HIGH (ref 0.0–100.0)
B Natriuretic Peptide: 1621.7 pg/mL — ABNORMAL HIGH (ref 0.0–100.0)

## 2019-08-12 LAB — AMMONIA
Ammonia: 42 umol/L — ABNORMAL HIGH (ref 9–35)
Ammonia: 44 umol/L — ABNORMAL HIGH (ref 9–35)

## 2019-08-12 LAB — URINALYSIS, ROUTINE W REFLEX MICROSCOPIC
Bilirubin Urine: NEGATIVE
Glucose, UA: NEGATIVE mg/dL
Hgb urine dipstick: NEGATIVE
Ketones, ur: NEGATIVE mg/dL
Leukocytes,Ua: NEGATIVE
Nitrite: NEGATIVE
Protein, ur: 30 mg/dL — AB
Specific Gravity, Urine: 1.021 (ref 1.005–1.030)
pH: 5 (ref 5.0–8.0)

## 2019-08-12 LAB — SARS CORONAVIRUS 2 BY RT PCR (HOSPITAL ORDER, PERFORMED IN ~~LOC~~ HOSPITAL LAB)
SARS Coronavirus 2: NEGATIVE
SARS Coronavirus 2: NEGATIVE

## 2019-08-12 LAB — TROPONIN I (HIGH SENSITIVITY)
Troponin I (High Sensitivity): 64 ng/L — ABNORMAL HIGH (ref <18)
Troponin I (High Sensitivity): 56 ng/L — ABNORMAL HIGH (ref <18)
Troponin I (High Sensitivity): 49 ng/L — ABNORMAL HIGH (ref <18)

## 2019-08-12 LAB — LACTIC ACID, PLASMA
Lactic Acid, Venous: 0.9 mmol/L (ref 0.5–1.9)
Lactic Acid, Venous: 0.9 mmol/L (ref 0.5–1.9)

## 2019-08-12 LAB — GLUCOSE, CAPILLARY: Glucose-Capillary: 159 mg/dL — ABNORMAL HIGH (ref 70–99)

## 2019-08-12 MED ORDER — SODIUM CHLORIDE 0.9% FLUSH
3.0000 mL | INTRAVENOUS | Status: DC | PRN
  Administered 2019-08-13: 3 mL via INTRAVENOUS

## 2019-08-12 MED ORDER — SODIUM CHLORIDE 0.9 % IV SOLN: 250.0000 mL | INTRAVENOUS | Status: DC | PRN

## 2019-08-12 MED ORDER — FUROSEMIDE 10 MG/ML IJ SOLN
40.0000 mg | Freq: Once | INTRAMUSCULAR | Status: AC
  Administered 2019-08-12: 40 mg via INTRAVENOUS
  Filled 2019-08-12: qty 4

## 2019-08-12 MED ORDER — METOPROLOL TARTRATE 25 MG PO TABS: 75.0000 mg | ORAL_TABLET | Freq: Two times a day (BID) | ORAL | Status: DC

## 2019-08-12 MED ORDER — METOPROLOL SUCCINATE ER 50 MG PO TB24
75.0000 mg | ORAL_TABLET | Freq: Every day | ORAL | Status: DC
  Administered 2019-08-12: 75 mg via ORAL
  Filled 2019-08-12: qty 1

## 2019-08-12 MED ORDER — ASPIRIN EC 81 MG PO TBEC
81.0000 mg | DELAYED_RELEASE_TABLET | Freq: Every day | ORAL | Status: DC
  Administered 2019-08-13: 81 mg via ORAL
  Filled 2019-08-12: qty 1

## 2019-08-12 MED ORDER — ACETAMINOPHEN 325 MG PO TABS: 650.0000 mg | ORAL_TABLET | ORAL | Status: DC | PRN

## 2019-08-12 MED ORDER — FUROSEMIDE 10 MG/ML IJ SOLN
40.0000 mg | Freq: Two times a day (BID) | INTRAMUSCULAR | Status: DC
  Administered 2019-08-12 – 2019-08-14 (×4): 40 mg via INTRAVENOUS
  Filled 2019-08-12 (×5): qty 4

## 2019-08-12 MED ORDER — LISINOPRIL 5 MG PO TABS: 5.0000 mg | ORAL_TABLET | Freq: Every day | ORAL | Status: DC

## 2019-08-12 MED ORDER — ONDANSETRON HCL 4 MG/2ML IJ SOLN: 4.0000 mg | Freq: Four times a day (QID) | INTRAMUSCULAR | Status: DC | PRN

## 2019-08-12 MED ORDER — SODIUM CHLORIDE 0.9% FLUSH
3.0000 mL | Freq: Two times a day (BID) | INTRAVENOUS | Status: DC
  Administered 2019-08-12 – 2019-08-15 (×8): 3 mL via INTRAVENOUS

## 2019-08-12 MED ORDER — DILTIAZEM HCL-DEXTROSE 125-5 MG/125ML-% IV SOLN (PREMIX)
5.0000 mg/h | INTRAVENOUS | Status: DC
  Administered 2019-08-12: 5 mg/h via INTRAVENOUS
  Administered 2019-08-13: 10 mg/h via INTRAVENOUS
  Administered 2019-08-13: 5 mg/h via INTRAVENOUS
  Filled 2019-08-12 (×3): qty 125

## 2019-08-12 MED ORDER — METOPROLOL TARTRATE 5 MG/5ML IV SOLN
5.0000 mg | Freq: Four times a day (QID) | INTRAVENOUS | Status: DC
  Administered 2019-08-12: 5 mg via INTRAVENOUS
  Filled 2019-08-12: qty 5

## 2019-08-12 MED ORDER — APIXABAN 5 MG PO TABS
5.0000 mg | ORAL_TABLET | Freq: Two times a day (BID) | ORAL | Status: DC
  Filled 2019-08-12: qty 1

## 2019-08-12 MED ORDER — FLUTICASONE FUROATE-VILANTEROL 200-25 MCG/INH IN AEPB
1.0000 | INHALATION_SPRAY | Freq: Every day | RESPIRATORY_TRACT | Status: DC
  Administered 2019-08-13 – 2019-08-16 (×4): 1 via RESPIRATORY_TRACT
  Filled 2019-08-12: qty 28

## 2019-08-12 MED ORDER — DILTIAZEM HCL ER COATED BEADS 120 MG PO CP24
120.0000 mg | ORAL_CAPSULE | Freq: Every day | ORAL | Status: DC
  Filled 2019-08-12: qty 1

## 2019-08-12 MED ORDER — MODAFINIL 100 MG PO TABS: 100.0000 mg | ORAL_TABLET | Freq: Every day | ORAL | Status: DC

## 2019-08-12 MED ORDER — ENOXAPARIN SODIUM 100 MG/ML ~~LOC~~ SOLN
90.0000 mg | Freq: Two times a day (BID) | SUBCUTANEOUS | Status: DC
  Administered 2019-08-12 – 2019-08-14 (×5): 90 mg via SUBCUTANEOUS
  Filled 2019-08-12: qty 0.9
  Filled 2019-08-12 (×2): qty 1
  Filled 2019-08-12: qty 0.9
  Filled 2019-08-12 (×2): qty 1

## 2019-08-12 NOTE — ED Provider Notes (Signed)
[facility] EMERGENCY DEPARTMENT Provider Note   CSN: [id number] Arrival date & time: 08/12/19  0124     History Chief Complaint  Patient presents with   Altered Mental Status    Cheryl [name] is a 79 y.o. female.  Patient brought to the emergency department from nursing home.  Nursing home called EMS because they noted that the patient was more confused than usual and seemed to be having difficulty breathing.  Patient reportedly has a history of CHF and COPD.  EMS report decreased oxygen saturation that did respond to supplemental oxygen.  At arrival to the emergency department patient is able to follow commands and answer yes and no questions but cannot provide a thorough history. Level V Caveat due to mental status change.           Past Medical History:  Diagnosis Date   COPD (chronic obstructive pulmonary disease) ([place])    Dementia ([place])    Diabetes mellitus without complication (HCC)    GERD (gastroesophageal reflux disease)    Hyperlipidemia    Hypertension    Lung cancer ([place])    Mental retardation    Schizophrenia ([place])     Patient Active Problem List   Diagnosis Date Noted   Acute on chronic systolic CHF (congestive heart failure) ([place]) 07/15/2019   Atrial flutter (HCC)    Sinus node dysfunction (HCC)    Persistent atrial fibrillation (HCC)    Systolic heart failure (HCC)    Dementia (HCC)    Type II diabetes mellitus with renal manifestations (HCC)    Elevated troponin    Acute on chronic respiratory failure with hypoxia (HCC)    Acute renal failure superimposed on stage 3a chronic kidney disease ([place])    Acute respiratory failure with hypoxia (HCC)    Shortness of breath    COPD with acute exacerbation ([place]) 05/09/2019   Respiratory failure with hypoxia and hypercapnia ([place]) 05/09/2019   Hyperglycemia due to diabetes mellitus ([place]) 05/09/2019   GERD (gastroesophageal reflux disease) 05/09/2019   HTN  (hypertension) 05/09/2019   HLD (hyperlipidemia) 05/09/2019   Moderate dementia, 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) 01/08/2016   Hypokalemia 02/07/2015   Cancer of left lung ([place]) 07/25/2011    Past Surgical History:  Procedure Laterality Date   ABDOMINAL HYSTERECTOMY       OB History   No obstetric history on file.     Family History  Problem Relation Age of Onset   Breast cancer Neg Hx     Social History   Tobacco Use   Smoking status: Former Smoker    Packs/day: 0.25    Types: Cigarettes    Quit date: 08/30/2016    Years since quitting: 2.9   Smokeless tobacco: Never Used  Vaping Use   Vaping Use: Never used  Substance Use Topics   Alcohol use: No   Drug use: No    Home Medications Prior to Admission medications   Medication Sig Start Date End Date Taking? Authorizing Provider  acetaminophen (TYLENOL) 325 MG tablet Take 650 mg by mouth every 4 (four) hours as needed for mild pain or fever.     [provider]  ALPRAZolam Duanne Moron) 0.25 MG tablet Take 1 tablet (0.25 mg total) by mouth 2 (two) times daily. 07/31/19   Elgergawy, Silver Huguenin, MD  apixaban (ELIQUIS) 5 MG TABS tablet Take 1 tablet (5 mg total) by mouth 2 (two) times daily.  07/31/19   Elgergawy, Silver Huguenin, MD  aspirin EC 81 MG EC tablet Take 1 tablet (81 mg total) by mouth daily. Swallow whole. 08/01/19   Elgergawy, Silver Huguenin, MD  atorvastatin (LIPITOR) 20 MG tablet Take 20 mg by mouth at bedtime.     [provider]  diltiazem (CARDIZEM CD) 120 MG 24 hr capsule Take 1 capsule (120 mg total) by mouth daily. 08/01/19   Elgergawy, Silver Huguenin, MD  ferrous sulfate 325 (65 FE) MG tablet Take 325 mg by mouth 2 (two) times daily with a meal.     [provider]  fluticasone (FLONASE) 50 MCG/ACT nasal spray Place 2 sprays into both nostrils daily.    [provider]  Fluticasone-Salmeterol (ADVAIR DISKUS)  250-50 MCG/DOSE AEPB Inhale 1 puff into the lungs 2 (two) times daily.    [provider]  insulin aspart (NOVOLOG) 100 UNIT/ML injection Inject 0-9 Units into the skin 3 (three) times daily with meals. 07/31/19   Elgergawy, Silver Huguenin, MD  insulin glargine (LANTUS) 100 UNIT/ML injection Inject 0.1 mLs (10 Units total) into the skin daily. 05/17/19   Vashti Hey, MD  levalbuterol Penne Lash) 0.63 MG/3ML nebulizer solution Take 3 mLs (0.63 mg total) by nebulization every 8 (eight) hours. 07/14/19   Sharen Hones, MD  magnesium hydroxide (MILK OF MAGNESIA) 400 MG/5ML suspension Take 30 mLs by mouth daily as needed for moderate constipation.     [provider]  Memantine HCl-Donepezil HCl (NAMZARIC) 28-10 MG CP24 Take 1 capsule by mouth daily. 05/15/19   Swayze, Ava, DO  Metoprolol Tartrate 75 MG TABS Take 75 mg by mouth 2 (two) times daily. 07/31/19   Elgergawy, Silver Huguenin, MD  modafinil (PROVIGIL) 100 MG tablet Take 1 tablet (100 mg total) by mouth daily. 05/15/19   Swayze, Ava, DO  omeprazole (PRILOSEC) 40 MG capsule Take 40 mg by mouth daily.     [provider]  perphenazine (TRILAFON) 4 MG tablet Take 1 tablet (4 mg total) by mouth 3 (three) times daily. 05/15/19   Swayze, Ava, DO  QUEtiapine (SEROQUEL) 100 MG tablet Take 1 tablet (100 mg total) by mouth at bedtime. 07/31/19   Elgergawy, Silver Huguenin, MD  torsemide (DEMADEX) 10 MG tablet Take 1 tablet (10 mg total) by mouth daily. 08/01/19   Elgergawy, Silver Huguenin, MD    Allergies    Patient has no known allergies.  Review of Systems   Review of Systems  Unable to perform ROS: Mental status change    Physical Exam Updated Vital Signs There were no vitals taken for this visit.  Physical Exam Vitals and nursing note reviewed. Exam conducted with a chaperone present.  Constitutional:      General: She is not in acute distress.    Appearance: Normal appearance. She is well-developed.  HENT:     Head: Normocephalic and  atraumatic.     Right Ear: Hearing normal.     Left Ear: Hearing normal.     Nose: Nose normal.  Eyes:     Conjunctiva/sclera: Conjunctivae normal.     Pupils: Pupils are equal, round, and reactive to light.  Cardiovascular:     Rate and Rhythm: Regular rhythm. Tachycardia present.     Heart sounds: S1 normal and S2 normal. No murmur heard.  No friction rub. No gallop.   Pulmonary:     Effort: Accessory muscle usage present.     Breath sounds: Decreased air movement present. Decreased breath sounds, wheezing and rales  present.  Chest:     Chest wall: No tenderness.  Abdominal:     General: Bowel sounds are normal.     Palpations: Abdomen is soft.     Tenderness: There is no abdominal tenderness. There is no guarding or rebound. Negative signs include Murphy's sign and McBurney's sign.     Hernia: No hernia is present.  Musculoskeletal:        General: Normal range of motion.     Cervical back: Normal range of motion and neck supple.     Right lower leg: 1+ Pitting Edema present.     Left lower leg: 1+ Pitting Edema present.  Skin:    General: Skin is warm and dry.     Findings: No rash.  Neurological:     Mental Status: She is alert and oriented to person, place, and time.     GCS: GCS eye subscore is 4. GCS verbal subscore is 5. GCS motor subscore is 6.     Cranial Nerves: No cranial nerve deficit.     Sensory: No sensory deficit.     Coordination: Coordination normal.  Psychiatric:        Speech: Speech normal.        Behavior: Behavior normal.        Thought Content: Thought content normal.     ED Results / Procedures / Treatments   Labs (all labs ordered are listed, but only abnormal results are displayed) Labs Reviewed  URINE CULTURE  SARS CORONAVIRUS 2 BY RT PCR (HOSPITAL ORDER, [place] [facility])  URINALYSIS, ROUTINE W REFLEX MICROSCOPIC  CBC WITH DIFFERENTIAL/PLATELET  COMPREHENSIVE METABOLIC PANEL  BRAIN NATRIURETIC PEPTIDE  LACTIC  ACID, PLASMA  AMMONIA  I-STAT ARTERIAL BLOOD GAS, ED  TROPONIN I (HIGH SENSITIVITY)            EKG EKG Interpretation  Date/Time:  Friday August 12 2019 03:54:45 EDT Ventricular Rate:  114 PR Interval:    QRS Duration: 127 QT Interval:  448 QTC Calculation: 618 R Axis:   43 Text Interpretation: Atrial flutter with predominant 2:1 AV block Ventricular premature complex IVCD, consider atypical RBBB Nonspecific T abnormalities, lateral leads Confirmed by Orpah Greek [phone]) on 08/12/2019 4:07:40 AM   Radiology No results found.  Procedures Procedures (including critical care time)  Medications Ordered in ED Medications  furosemide (LASIX) injection 40 mg (has no administration in time range)    ED Course  I have reviewed the triage vital signs and the nursing notes.  Pertinent labs & imaging results that were available during my care of the patient were reviewed by me and considered in my medical decision making (see chart for details).    MDM Rules/Calculators/A&P                          Patient brought to the emergency department by EMS from nursing home.  Apparently the nursing home misidentified the patient, providing the face sheet for another patient.  Patient was therefore registered incorrectly because she has acute mental status changes and is disoriented, could not tell us who she was.  Some results, therefore, are not available in this chart at this time.  Patient reportedly had mental status changes and shortness of breath.  Timing on this is unclear.  Nursing home staff had told EMS that symptoms were present for approximately 4 hours, however as they did not even provide the correct patient information, this is  questionable.  Last known normal is therefore unknown.  Patient awake and able to answer questions at arrival.  She is able to answer yes and no questions but is not verbally responding appropriately to additional questions.  She cannot  provide any further history.  She does appear to be short of breath with increased work of breathing.  Work-up consistent with congestive heart failure exacerbation.  Blood gas also did show CO2 retention with a CO2 of 69.  She was therefore placed on BiPAP and given IV Lasix.  Head CT was performed because of altered mental status.  Its not clear at this time what her baseline mental status is, as she carries a diagnosis of schizophrenia, dementia and mental retardation.  The head CT did show hypodensity adjacent to the right lateral ventricle that could represent acute or subacute lacunar infarct.  I do not believe that the patient will tolerate an MRI based on her respiratory status at this time.  She is already anticoagulated on Eliquis.  As already stated, last known normal is unclear, she is not a candidate for systemic TPA.  No evidence of large vessel occlusion.  This finding was discussed with Dr. Rory Percy who was able to access the images.  This was called a new finding because it was compared to the wrong patient previous imaging because of the registration error.  When Ms. Pilat's previous head CTs were accessed, this hypodensity was present previously and is chronic.  It does not require further work-up.  CRITICAL CARE Performed by: Orpah Greek   Total critical care time: 35 minutes  Critical care time was exclusive of separately billable procedures and treating other patients.  Critical care was necessary to treat or prevent imminent or life-threatening deterioration.  Critical care was time spent personally by me on the following activities: development of treatment plan with patient and/or surrogate as well as nursing, discussions with consultants, evaluation of patient's response to treatment, examination of patient, obtaining history from patient or surrogate, ordering and performing treatments and interventions, ordering and review of laboratory studies, ordering and review  of radiographic studies, pulse oximetry and re-evaluation of patient's condition.   Final Clinical Impression(s) / ED Diagnoses Final diagnoses:  Acute on chronic congestive heart failure, unspecified heart failure type ([place])  Acute respiratory failure with hypoxia and hypercapnia ([place])    Rx / DC Orders ED Discharge Orders    None       Daksha Koone, Gwenyth Allegra, MD 08/12/19 [phone]

## 2019-08-12 NOTE — Progress Notes (Signed)
Long discussion with son Cheryl [name] He is supportive after a detailed discussion with me regarding a Palliative/Hopsice trajectory I will consult palliative care to help delineate the goals of care She is a limited code based on my discussions and review of the MOST form with her son over the phone I have expressed significant concerns about this recurring and about prolonging her life with suffering and repeat hospitalizations She is definitely hospice eligible Hopefully cardiology can give [place] some guidance with regards to medications to control some of her heart rate issues in the next 24 hours, however I have discussed with him some of the futility around her care and I think he is agreeable and open to seeing what hospice has to offer  Verneita Griffes, MD [facility] 7:26 PM

## 2019-08-12 NOTE — ED Triage Notes (Addendum)
ED Triage Notes Addendum AS       Details:  Pt is from [facility] and staff noticed today pt was having increased Sob and altered mental status. Pt will follow some commands and moans things. Pt keeps going in and out. Pt does have some wheezing sound when inhaling and exhaling. Pt has no fevers, no coughing. Pt was given 2g of mag and 125 solumedrol from ems

## 2019-08-12 NOTE — Progress Notes (Signed)
ANTICOAGULATION CONSULT NOTE - Initial Consult  Pharmacy Consult for lovenox Indication: atrial fibrillation  No Known Allergies  Patient Measurements:    Vital Signs: BP: 137/97 (07/30 0845) Pulse Rate: 63 (07/30 0845)  Labs: Recent Labs    08/12/19 0413 08/12/19 0413 08/12/19 0427 08/12/19 0606 08/12/19 0635  HGB 10.4*   < > 11.2*  --  11.6*  HCT 36.2  --  33.0*  --  34.0*  PLT 272  --   --   --   --   CREATININE 1.15*  --   --   --   --   TROPONINIHS 56*  --   --  49*  --    < > = values in this interval not displayed.    Estimated Creatinine Clearance: 44 mL/min (A) (by C-G formula based on SCr of 1.15 mg/dL (H)).   Medical History: Past Medical History:  Diagnosis Date   COPD (chronic obstructive pulmonary disease) ([place])    Dementia ([place])    Diabetes mellitus without complication (HCC)    GERD (gastroesophageal reflux disease)    Hyperlipidemia    Hypertension    Lung cancer ([place])    Mental retardation    Schizophrenia ([place])    Assessment: 32 yof presented to the ED with difficulty breathing. She is on chronic apixaban for afib but switching to lovenox for now. Hgb is low at 11.6 but platelets are WNL. No bleeding noted.   Goal of Therapy:  Anti-Xa level 0.6-1 units/ml 4hrs after LMWH dose given Monitor platelets by anticoagulation protocol: Yes   Plan:  Lovenox 1mg /kg SQ Q12H CBC Q72H F/u ability to restart apixaban  Zan Triska, Rande Lawman 08/12/2019,9:40 AM

## 2019-08-12 NOTE — H&P (Addendum)
PCP:   Cheryl Median, MD   Chief Complaint:  Difficulty breathing  HPI: Patient sent in from the nursing home for having difficulty breathing.  She also is little bit more confused than baseline.  The patient has a history of schizophrenia mental retardation and was not able to provide any history.  In the ER the patient is sluggish with decreased level of response.  Her ABG showed hypercapnia with mildly decreased pH.  Her work-up is consistent with congestive heart failure.  She has been placed on BiPAP and the hospitalist have been asked to admit.  On initial presentation patient was initially worked up under a different name.  But this has been sorted out between ER physician and family member for the all the patient.   Review of Systems:  Unable to obtain  Past Medical History: Past Medical History:  Diagnosis Date   COPD (chronic obstructive pulmonary disease) ([place])    Dementia ([place])    Diabetes mellitus without complication (HCC)    GERD (gastroesophageal reflux disease)    Hyperlipidemia    Hypertension    Lung cancer ([place])    Mental retardation    Schizophrenia ([place])    Past Surgical History:  Procedure Laterality Date   ABDOMINAL HYSTERECTOMY      Medications: Prior to Admission medications   Medication Sig Start Date End Date Taking? Authorizing Provider  acetaminophen (TYLENOL) 325 MG tablet Take 650 mg by mouth every 4 (four) hours as needed for mild pain or fever.     [provider]  ALPRAZolam Duanne Moron) 0.25 MG tablet Take 1 tablet (0.25 mg total) by mouth 2 (two) times daily. 07/31/19   [name], Cheryl Huguenin, MD  apixaban (ELIQUIS) 5 MG TABS tablet Take 1 tablet (5 mg total) by mouth 2 (two) times daily. 07/31/19   [name], Cheryl Huguenin, MD  aspirin EC 81 MG EC tablet Take 1 tablet (81 mg total) by mouth daily. Swallow whole. 08/01/19   [name], Cheryl Huguenin, MD  atorvastatin (LIPITOR) 20 MG tablet Take 20 mg by mouth at bedtime.     [provider]  diltiazem (CARDIZEM CD) 120 MG 24 hr capsule Take 1 capsule (120 mg total) by mouth daily. 08/01/19   [name], Cheryl Huguenin, MD  ferrous sulfate 325 (65 FE) MG tablet Take 325 mg by mouth 2 (two) times daily with a meal.     [provider]  fluticasone (FLONASE) 50 MCG/ACT nasal spray Place 2 sprays into both nostrils daily.    [provider]  Fluticasone-Salmeterol (ADVAIR DISKUS) 250-50 MCG/DOSE AEPB Inhale 1 puff into the lungs 2 (two) times daily.    [provider]  insulin aspart (NOVOLOG) 100 UNIT/ML injection Inject 0-9 Units into the skin 3 (three) times daily with meals. 07/31/19   [name], Cheryl Huguenin, MD  insulin glargine (LANTUS) 100 UNIT/ML injection Inject 0.1 mLs (10 Units total) into the skin daily. 05/17/19   Cheryl Hey, MD  levalbuterol Penne Lash) 0.63 MG/3ML nebulizer solution Take 3 mLs (0.63 mg total) by nebulization every 8 (eight) hours. 07/14/19   Cheryl Hones, MD  magnesium hydroxide (MILK OF MAGNESIA) 400 MG/5ML suspension Take 30 mLs by mouth daily as needed for moderate constipation.     [provider]  Memantine HCl-Donepezil HCl (NAMZARIC) 28-10 MG CP24 Take 1 capsule by mouth daily. 05/15/19   [name], Ava, DO  Metoprolol Tartrate 75 MG TABS Take 75 mg by mouth 2 (two) times daily. 07/31/19   [name],  Cheryl Huguenin, MD  modafinil (PROVIGIL) 100 MG tablet Take 1 tablet (100 mg total) by mouth daily. 05/15/19   [name], Ava, DO  omeprazole (PRILOSEC) 40 MG capsule Take 40 mg by mouth daily.     [provider]  perphenazine (TRILAFON) 4 MG tablet Take 1 tablet (4 mg total) by mouth 3 (three) times daily. 05/15/19   [name], Ava, DO  QUEtiapine (SEROQUEL) 100 MG tablet Take 1 tablet (100 mg total) by mouth at bedtime. 07/31/19   [name], Cheryl Huguenin, MD  torsemide (DEMADEX) 10 MG tablet Take 1 tablet (10 mg total) by mouth daily. 08/01/19   [name], Cheryl Huguenin, MD    Allergies:  No Known Allergies  Social  History:  reports that she quit smoking about 2 years ago. Her smoking use included cigarettes. She smoked 0.25 packs per day. She has never used smokeless tobacco. She reports that she does not drink alcohol and does not use drugs.  Family History: Family History  Problem Relation Age of Onset   Breast cancer Neg Hx     Physical Exam: Vitals:   08/12/19 0435  BP: (!) 171/108  Pulse: 101  Resp: 18  SpO2: 100%    General: Somnolent, Glasgow Coma Scale of 8 E2, V1,M5 Eyes: PERRLA, pink conjunctiva, no scleral icterus ENT: Moist oral mucosa, neck supple, no thyromegaly Lungs: clear to ascultation, no wheeze, no crackles, no use of accessory muscles Cardiovascular: regular rate and rhythm, no regurgitation, no gallops, no murmurs. No carotid bruits, no JVD Abdomen: soft, positive BS, non-tender, non-distended, no organomegaly, not an acute abdomen GU: not examined Neuro: Unable to properly assess due to patient's mentation Musculoskeletal: Moves extremities but unable to properly assess due to patient's mentation Skin: no rash, no subcutaneous crepitation, no decubitus Psych: Somnolent patient GCS: 8   Labs on Admission:  Recent Labs    08/12/19 0413 08/12/19 0427  NA 145 145  K 4.2 4.0  CL 102  --   CO2 33*  --   GLUCOSE 154*  --   BUN 15  --   CREATININE 1.15*  --   CALCIUM 9.1  --    Recent Labs    08/12/19 0413  AST 50*  ALT 63*  ALKPHOS 95  BILITOT 0.8  PROT 7.0  ALBUMIN 3.3*   Results for Cheryl [name] (MRN [id number]) as of 08/12/2019 05:25  Ref. Range 08/12/2019 04:13  B Natriuretic Peptide Latest Ref Range: 0.0 - 100.0 pg/mL 1,621.7 (H)  Troponin I (High Sensitivity) Latest Ref Range: <18 ng/L 56 (H)   No results for input(s): LIPASE, AMYLASE in the last 72 hours. Recent Labs    08/12/19 0413 08/12/19 0427  WBC 6.3  --   NEUTROABS 5.5  --   HGB 10.4* 11.2*  HCT 36.2 33.0*  MCV 94.0  --   PLT 272  --    No results for input(s): CKTOTAL,  CKMB, CKMBINDEX, TROPONINI in the last 72 hours. Invalid input(s): POCBNP No results for input(s): DDIMER in the last 72 hours. No results for input(s): HGBA1C in the last 72 hours. No results for input(s): CHOL, HDL, LDLCALC, TRIG, CHOLHDL, LDLDIRECT in the last 72 hours. No results for input(s): TSH, T4TOTAL, T3FREE, THYROIDAB in the last 72 hours.  Invalid input(s): FREET3 No results for input(s): VITAMINB12, FOLATE, FERRITIN, TIBC, IRON, RETICCTPCT in the last 72 hours.  Micro Results: No results found for this or any previous visit (from the past 240 hour(s)).   Radiological Exams on  Admission: CT Head Wo Contrast  Result Date: 08/12/2019 CLINICAL DATA:  Altered mental status. Neuro deficit, acute, stroke suspected. EXAM: CT HEAD WITHOUT CONTRAST TECHNIQUE: Contiguous axial images were obtained from the base of the skull through the vertex without intravenous contrast. COMPARISON:  MR head without contrast 10/16/2016. CT head without contrast 05/04/2016 FINDINGS: Brain: Mild atrophy and white matter changes are stable. No acute infarct, hemorrhage, or mass lesion is present. Basal ganglia are normal. Insular ribbon is normal bilaterally. The ventricles are of normal size. No significant extraaxial fluid collection is present. Vascular: Atherosclerotic calcifications are present in the cavernous internal carotid arteries. No hyperdense vessel is present. Skull: Calvarium is intact. No focal lytic or blastic lesions are present. No significant extracranial soft tissue lesion is present. Sinuses/Orbits: A fluid level is present in the left maxillary sinus. Single left ethmoid air cell is opacified. Mild mucosal thickening is present in the right sphenoid sinus. The paranasal sinuses and mastoid air cells are otherwise clear. The globes and orbits are within normal limits. IMPRESSION: 1. Stable atrophy and white matter disease. This likely reflects the sequela of chronic microvascular ischemia. 2.  No acute intracranial abnormality or significant interval change. 3. Left maxillary and ethmoid sinus disease. Electronically Signed   By: San Morelle M.D.   On: 08/12/2019 04:59   DG Chest Port 1 View  Result Date: 08/12/2019 CLINICAL DATA:  Shortness of breath.  Altered mental status. EXAM: PORTABLE CHEST 1 VIEW COMPARISON:  One-view chest x-ray 07/15/2019 FINDINGS: Heart is enlarged. Atherosclerotic calcifications are present at the aortic arch. Interstitial edema and bilateral effusions are increasing. Bibasilar airspace opacities are increasing, right greater than left. IMPRESSION: 1. Cardiomegaly with increasing interstitial edema and bilateral effusions compatible with congestive heart failure. 2. Bibasilar airspace disease, right greater than left. This likely represents atelectasis, infection is not excluded. Electronically Signed   By: San Morelle M.D.   On: 08/12/2019 04:36    Assessment/Plan Present on Admission:  Acute on chronic systolic CHF (congestive heart failure) ([place]) -Admit some meds telemetry -Most recent 2D echo 07/2019 with a EF of 30-35% -CHF order set initiated -IV Lasix, daily weights, strict I's and O's, oxygen to keep sats greater than 88 -We will continue BiPAP for now, timed repeat ABG ordered -somnolence improving   Atrial flutter (HCC) -Currently in a flutter, continue home meds   Dementia (HCC) mental retardation -Stable at baseline, home meds resumed, alll sedating meds held   HLD (hyperlipidemia) -Stable, home meds resumed   HTN (hypertension -Stable, home meds resumed   Type II diabetes mellitus ([place]) -Lantus resumed, sliding scale insulin   History of cancer of left lung ([place]) -  Cheryl [name] 08/12/2019, 5:26 AM

## 2019-08-12 NOTE — Progress Notes (Signed)
Patient seen and examined and agree with Dr. Torrie Mayers note from this morning  73 black female from [facility] (previously [facility]) H/o HTN HLD DM TY 2 COPD lung cancer status post XRT CKD 3 Reflux Bipolar/schizophrenia with moderate to severe dementia and apparently current echolalia and does not interact meaningfully according to prior notes Multiple recurrent hospitalizations 6/20-07/14/2019 for acute respiratory failure-developed junctional rhythm sinus pause at that time placed on dopamine gtt. EP evaluated switched her to Toprol they did not recommend at that time PPM placement she also carries a diagnosis of paroxysmal A. fibAnd likely sick sinus syndrome Patient discharged from [facility] and readmitted the next day and was discharged eventually after prolonged stay 07/31/2019 --she developed hypoxic respiratory failure again requiring BiPAP on admission and was weaned off apparently after diuresis During that admission she was placed on amiodarone and discontinued off of it because of ST depressions cardiology was consulted they did not feel that the time any intervention was warranted   BP (!) 150/93 (BP Location: Left Arm)   Pulse 104   Temp 98.3 F (36.8 C) (Oral)   Resp 18   SpO2 97%  Patient was evaluated multiple times in the emergency room today she had ABGs which have shown a progressive improvement in her CO2 and she has become more oriented I reassessed her at 7:03 PM and it looks like her heart rate is slowing down to some degree on 15 of Cardizem She was awake enough that she requested to pull off her BiPAP and she is able to tell me to some degree that she is at "home" She can tell me her son's name but can only orient this much Her speech is relatively unintelligible and it is hard to understand what she is saying because she is edentulous She looks much better than this morning  I had a long and detailed discussion with Dr. Domenic Polite of  cardiology and request that cardiology see the patient in the morning as I feel her heart rate is slowing down to the point that she may be able to be controlled on beta-blocker however there was question of whether she would need digoxin  on last admission and it appears that amiodarone was discontinued in favor of beta-blockade  I called the patient's son Deshae Dickison at [phone] update and also to figure out CODE STATUS discussions in addition to updating with regards to further planning but did not reach him his number just running out  We will probably need to get a blood gas in the morning Cardiology is available to see the patient should something happen tonight  Verneita Griffes, MD [facility] 7:10 PM

## 2019-08-12 NOTE — ED Notes (Signed)
Pt was registered under another name that was brought in by ems. Pt was actually here at 1:30 AM. PT was from [facility]. Pt was very altered and was not able to answer any question upon her arrival. Pt was brought in with someone else's chart also. Pt's family of both patients was notified from [facility] and the granddaughter of the other patient identified this patient as not being the right name.

## 2019-08-13 ENCOUNTER — Inpatient Hospital Stay (HOSPITAL_COMMUNITY): Payer: Medicare Other

## 2019-08-13 ENCOUNTER — Encounter (HOSPITAL_COMMUNITY): Payer: Self-pay | Admitting: Family Medicine

## 2019-08-13 LAB — CBC WITH DIFFERENTIAL/PLATELET
Abs Immature Granulocytes: 0.01 10*3/uL (ref 0.00–0.07)
Basophils Absolute: 0 10*3/uL (ref 0.0–0.1)
Basophils Relative: 0 %
Eosinophils Absolute: 0 10*3/uL (ref 0.0–0.5)
Eosinophils Relative: 0 %
HCT: 32.9 % — ABNORMAL LOW (ref 36.0–46.0)
Hemoglobin: 9.8 g/dL — ABNORMAL LOW (ref 12.0–15.0)
Immature Granulocytes: 0 %
Lymphocytes Relative: 16 %
Lymphs Abs: 1 10*3/uL (ref 0.7–4.0)
MCH: 26.8 pg (ref 26.0–34.0)
MCHC: 29.8 g/dL — ABNORMAL LOW (ref 30.0–36.0)
MCV: 90.1 fL (ref 80.0–100.0)
Monocytes Absolute: 0.7 10*3/uL (ref 0.1–1.0)
Monocytes Relative: 11 %
Neutro Abs: 4.6 10*3/uL (ref 1.7–7.7)
Neutrophils Relative %: 73 %
Platelets: 326 10*3/uL (ref 150–400)
RBC: 3.65 MIL/uL — ABNORMAL LOW (ref 3.87–5.11)
RDW: 14.4 % (ref 11.5–15.5)
WBC: 6.3 10*3/uL (ref 4.0–10.5)
nRBC: 0 % (ref 0.0–0.2)

## 2019-08-13 LAB — GLUCOSE, CAPILLARY
Glucose-Capillary: 111 mg/dL — ABNORMAL HIGH (ref 70–99)
Glucose-Capillary: 142 mg/dL — ABNORMAL HIGH (ref 70–99)
Glucose-Capillary: 166 mg/dL — ABNORMAL HIGH (ref 70–99)
Glucose-Capillary: 112 mg/dL — ABNORMAL HIGH (ref 70–99)

## 2019-08-13 LAB — BRAIN NATRIURETIC PEPTIDE: B Natriuretic Peptide: 943.5 pg/mL — ABNORMAL HIGH (ref 0.0–100.0)

## 2019-08-13 LAB — BLOOD GAS, ARTERIAL
Acid-Base Excess: 12.2 mmol/L — ABNORMAL HIGH (ref 0.0–2.0)
Bicarbonate: 37.4 mmol/L — ABNORMAL HIGH (ref 20.0–28.0)
Drawn by: 34560
FIO2: 30
O2 Saturation: 96.5 %
Patient temperature: 37
pCO2 arterial: 60.6 mmHg — ABNORMAL HIGH (ref 32.0–48.0)
pH, Arterial: 7.408 (ref 7.350–7.450)
pO2, Arterial: 82.5 mmHg — ABNORMAL LOW (ref 83.0–108.0)

## 2019-08-13 LAB — BASIC METABOLIC PANEL
Anion gap: 10 (ref 5–15)
BUN: 20 mg/dL (ref 8–23)
CO2: 34 mmol/L — ABNORMAL HIGH (ref 22–32)
Calcium: 9.3 mg/dL (ref 8.9–10.3)
Chloride: 100 mmol/L (ref 98–111)
Creatinine, Ser: 1.29 mg/dL — ABNORMAL HIGH (ref 0.44–1.00)
GFR calc Af Amer: 46 mL/min — ABNORMAL LOW (ref >60)
GFR calc non Af Amer: 40 mL/min — ABNORMAL LOW (ref >60)
Glucose, Bld: 136 mg/dL — ABNORMAL HIGH (ref 70–99)
Potassium: 3.3 mmol/L — ABNORMAL LOW (ref 3.5–5.1)
Sodium: 144 mmol/L (ref 135–145)

## 2019-08-13 LAB — MAGNESIUM: Magnesium: 2.2 mg/dL (ref 1.7–2.4)

## 2019-08-13 MED ORDER — ASPIRIN 81 MG PO TBEC: 81.0000 mg | DELAYED_RELEASE_TABLET | Freq: Every day | ORAL | Status: DC

## 2019-08-13 MED ORDER — GUAIFENESIN ER 600 MG PO TB12
600.0000 mg | ORAL_TABLET | Freq: Two times a day (BID) | ORAL | Status: DC
  Administered 2019-08-13 – 2019-08-16 (×6): 600 mg via ORAL
  Filled 2019-08-13 (×6): qty 1

## 2019-08-13 MED ORDER — LEVALBUTEROL HCL 0.63 MG/3ML IN NEBU
0.6300 mg | INHALATION_SOLUTION | Freq: Three times a day (TID) | RESPIRATORY_TRACT | Status: DC
  Administered 2019-08-13: 0.63 mg via RESPIRATORY_TRACT
  Filled 2019-08-13: qty 3

## 2019-08-13 MED ORDER — METOPROLOL TARTRATE 50 MG PO TABS
50.0000 mg | ORAL_TABLET | Freq: Four times a day (QID) | ORAL | Status: DC
  Administered 2019-08-13 – 2019-08-14 (×4): 50 mg via ORAL
  Filled 2019-08-13 (×4): qty 1

## 2019-08-13 MED ORDER — LEVALBUTEROL HCL 0.63 MG/3ML IN NEBU: 0.6300 mg | INHALATION_SOLUTION | Freq: Three times a day (TID) | RESPIRATORY_TRACT | Status: DC

## 2019-08-13 MED ORDER — FLUTICASONE PROPIONATE 50 MCG/ACT NA SUSP
2.0000 | Freq: Every day | NASAL | Status: DC
  Administered 2019-08-14 – 2019-08-16 (×2): 2 via NASAL
  Filled 2019-08-13: qty 16

## 2019-08-13 MED ORDER — POTASSIUM CHLORIDE CRYS ER 20 MEQ PO TBCR
40.0000 meq | EXTENDED_RELEASE_TABLET | Freq: Two times a day (BID) | ORAL | Status: DC
  Administered 2019-08-13 – 2019-08-15 (×5): 40 meq via ORAL
  Filled 2019-08-13 (×5): qty 2

## 2019-08-13 MED ORDER — FERROUS SULFATE 325 (65 FE) MG PO TABS
325.0000 mg | ORAL_TABLET | Freq: Two times a day (BID) | ORAL | Status: DC
  Administered 2019-08-13 – 2019-08-16 (×6): 325 mg via ORAL
  Filled 2019-08-13 (×6): qty 1

## 2019-08-13 NOTE — Consult Note (Addendum)
Cardiology Consultation:   Patient ID: Cheryl [name] MRN: [id number]; DOB: 01/27/1940  Admit date: 08/12/2019 Date of Consult: 08/13/2019  Primary Care Provider: Letta Median, MD [facility] Cardiologist: Kathlyn Sacramento, MD  [place] Electrophysiologist:  None   Patient Profile:   Cheryl [name] is a 79 y.o. female with a hx of dementia, schizophenia, mental retardation, COPD, DM, GERD, HTN, HLD, lung CA s/p prior XRT, paroxysmal atrial flutter with probable sick sinus syndrome with post-temination pauses, LV dysfunction with chronic systolic CHF, chronic appearing anemia who is being seen today for the evaluation of atrial flutter at the request of Dr. Verlon Au.  History of Present Illness:   She has had multiple admissions lately, with no prior cardiac history until lately. She has a known RBBB and PVCs. She was seen by our [place] team in 06/2019 for new onset atrial flutter in the context of COPD exacerbation. She was also found to have LVEF 30-35%, mildly reduced RV function with mildly enlarged RV and mildly elevated PASP, mild LAE, moderate TR, moderately dilated PA. Heart rates were initially difficult to control, requiring amiodarone. However, she had a 5-second posttermination pause followed by junctional bradycardia and hypotension requiring dopamine and atropine on 07/04/19. She has since struggled with recurrence of atrial flutter in the context of recurrent respiratory failure requiring BiPAP. She has also required diuresis for CHF, complicated by AKI on CKD stage III. Se was not felt to be a candidate for invasive workup and historically has been a poor historian and does not always interact meaningfully. Ultimately rate control strategy was chosen. She was discharged on 07/31/19 on torsemide 10mg , Lopressor 75mg  BID, diltiazem 120mg  daily, aspirin, and Eliquis 5mg  BID. She was discharged at 194lb.  She returned to the ED yesterday from [facility] with observed  altered mental status with moaning and increased SOB. She was found to be wheezing and was reportedly given 2g of magnesium and 125mg  of Solu-Medrol from EMS. Her ABG showed hypercapnia with mildly decreased pH. She was placed on BiPAP and again admitted by the IM service. Initial labs revealed BNP of 1621-943, hsTroponin 56-49, Hgb 10.4 -> 9.8, lactic acid wnl, Covid-19 negative, K now 3.3 and Cr 1.29 c/w known baseline (range from 1.3-1.9 lately). CXR with cardiomegaly with increasing interstitial edema and bilateral effusions compatible with congestive heart failure, bilateral airspace disease R>L cannot exclude infection. CT head without acute intracranial abnormality. Eliquis has been held and changed to Lovenox full dose. She has been started on Lasix 40mg  IV BID. She received 1 dose of IV metoprolol yesterday, changed to Toprol orally 75mg  only once daily along with Cardizem drip without bolus at 10mg /hr). ED weight not recorded yesterday but is 202lb this AM.  This morning she is oxygenating well on BiPAP, lying quite flat in bed without acute dyspnea. She answers questions briefly and follows commands but cannot tell me why she is here or where she is. She denies any pain or shortness of breath. HR 80s-90s on telemetry on 10mg  IV diltiazem.  Past Medical History:  Diagnosis Date  . Cardiomyopathy ([place])   . CKD (chronic kidney disease), stage III   . COPD (chronic obstructive pulmonary disease) ([place])   . Dementia ([place])   . Diabetes mellitus type 2 in obese ([place])   . GERD (gastroesophageal reflux disease)   . Hyperlipidemia   . Hypertension   . Lung cancer ([place])   . Mental retardation   . Paroxysmal atrial flutter ([place])   . Schizophrenia (  [place])   . Sick sinus syndrome ([place])    post termination pauses    Past Surgical History:  Procedure Laterality Date  . ABDOMINAL HYSTERECTOMY       Home Medications:  Prior to Admission medications   Medication Sig Start Date End Date Taking?  Authorizing Provider  acetaminophen (TYLENOL) 325 MG tablet Take 650 mg by mouth every 4 (four) hours as needed for mild pain or fever.    Yes [provider]  ALPRAZolam (XANAX) 0.25 MG tablet Take 1 tablet (0.25 mg total) by mouth 2 (two) times daily. 07/31/19  Yes Elgergawy, Silver Huguenin, MD  amoxicillin-clavulanate (AUGMENTIN) 875-125 MG tablet Take 1 tablet by mouth 2 (two) times daily. Start date: 08/09/2019 End date: 08/15/2019 08/09/19 08/15/19 Yes [provider]  apixaban (ELIQUIS) 5 MG TABS tablet Take 1 tablet (5 mg total) by mouth 2 (two) times daily. 07/31/19  Yes Elgergawy, Silver Huguenin, MD  aspirin EC 81 MG EC tablet Take 1 tablet (81 mg total) by mouth daily. Swallow whole. 08/01/19  Yes Elgergawy, Silver Huguenin, MD  atorvastatin (LIPITOR) 20 MG tablet Take 20 mg by mouth at bedtime.    Yes [provider]  diltiazem (CARDIZEM CD) 120 MG 24 hr capsule Take 1 capsule (120 mg total) by mouth daily. 08/01/19  Yes Elgergawy, Silver Huguenin, MD  ferrous sulfate 325 (65 FE) MG tablet Take 325 mg by mouth 2 (two) times daily.    Yes [provider]  fluticasone (FLONASE) 50 MCG/ACT nasal spray Place 2 sprays into both nostrils daily.   Yes [provider]  Fluticasone-Salmeterol (ADVAIR DISKUS) 250-50 MCG/DOSE AEPB Inhale 1 puff into the lungs 2 (two) times daily.   Yes [provider]  guaiFENesin (MUCINEX) 600 MG 12 hr tablet Take 600 mg by mouth 2 (two) times daily.   Yes [provider]  insulin aspart (NOVOLOG) 100 UNIT/ML injection Inject 0-9 Units into the skin 3 (three) times daily with meals. Patient taking differently: Inject 0-10 Units into the skin 3 (three) times daily with meals. Per sliding scale: BG 150-200 = 2 units BG 201-250 = 4 units BG 251-300 = 6 units BG 301-350 = 8 units BG 351-400 = 10 units BG >400 = call MD 07/31/19  Yes Elgergawy, Silver Huguenin, MD  insulin glargine (LANTUS) 100 UNIT/ML injection Inject 0.1 mLs (10 Units total)  into the skin daily. 05/17/19  Yes Vashti Hey, MD  levalbuterol Penne Lash) 0.63 MG/3ML nebulizer solution Take 3 mLs (0.63 mg total) by nebulization every 8 (eight) hours. 07/14/19  Yes Sharen Hones, MD  magnesium hydroxide (MILK OF MAGNESIA) 400 MG/5ML suspension Take 30 mLs by mouth daily as needed for moderate constipation.    Yes [provider]  Memantine HCl-Donepezil HCl (NAMZARIC) 28-10 MG CP24 Take 1 capsule by mouth daily. 05/15/19  Yes Swayze, Ava, DO  Metoprolol Tartrate 75 MG TABS Take 75 mg by mouth 2 (two) times daily. 07/31/19  Yes Elgergawy, Silver Huguenin, MD  modafinil (PROVIGIL) 100 MG tablet Take 1 tablet (100 mg total) by mouth daily. 05/15/19  Yes Swayze, Ava, DO  omeprazole (PRILOSEC) 40 MG capsule Take 40 mg by mouth daily.    Yes [provider]  perphenazine (TRILAFON) 4 MG tablet Take 1 tablet (4 mg total) by mouth 3 (three) times daily. 05/15/19  Yes Swayze, Ava, DO  QUEtiapine (SEROQUEL) 100 MG tablet Take 1 tablet (100 mg total) by mouth at bedtime. 07/31/19  Yes Elgergawy, Silver Huguenin, MD  torsemide (DEMADEX) 10 MG tablet Take 1 tablet (10 mg total) by mouth daily. 08/01/19  Yes Elgergawy, Silver Huguenin, MD    Inpatient Medications: Scheduled Meds: . aspirin EC  81 mg Oral Daily  . enoxaparin (LOVENOX) injection  90 mg Subcutaneous Q12H  . fluticasone furoate-vilanterol  1 puff Inhalation Daily  . furosemide  40 mg Intravenous Q12H  . metoprolol succinate  75 mg Oral Daily  . sodium chloride flush  3 mL Intravenous Q12H   Continuous Infusions: . sodium chloride    . diltiazem (CARDIZEM) infusion 10 mg/hr (08/13/19 0035)   PRN Meds: sodium chloride, acetaminophen, sodium chloride flush  Allergies:   No Known Allergies  Social History:   Social History   Socioeconomic History  . Marital status: Single    Spouse name: Not on file  . Number of children: Not on file  . Years of education: Not on file  . Highest education level: Not on file    Occupational History  . Not on file  Tobacco Use  . Smoking status: Former Smoker    Packs/day: 0.25    Types: Cigarettes    Quit date: 08/30/2016    Years since quitting: 2.9  . Smokeless tobacco: Never Used  Vaping Use  . Vaping Use: Never used  Substance and Sexual Activity  . Alcohol use: No  . Drug use: No  . Sexual activity: Not Currently  Other Topics Concern  . Not on file  Social History Narrative  . Not on file   Social Determinants of Health   Financial Resource Strain:   . Difficulty of Paying Living Expenses:   Food Insecurity:   . Worried About Charity fundraiser in the Last Year:   . Arboriculturist in the Last Year:   Transportation Needs:   . Film/video editor (Medical):   Marland Kitchen Lack of Transportation (Non-Medical):   Physical Activity:   . Days of Exercise per Week:   . Minutes of Exercise per Session:   Stress:   . Feeling of Stress :   Social Connections:   . Frequency of Communication with Friends and Family:   . Frequency of Social Gatherings with Friends and Family:   . Attends Religious Services:   . Active Member of Clubs or Organizations:   . Attends Archivist Meetings:   Marland Kitchen Marital Status:   Intimate Partner Violence:   . Fear of Current or Ex-Partner:   . Emotionally Abused:   Marland Kitchen Physically Abused:   . Sexually Abused:     Family History:   Family History  Problem Relation Age of Onset  . Breast cancer Neg Hx      ROS:  I do not think the patient is capable of providing a full accurate ROS given her mental status/dementia.   Physical Exam/Data:   Vitals:   08/13/19 0015 08/13/19 0407 08/13/19 0448 08/13/19 0709  BP: (!) 132/80  (!) 127/51 (!) 135/96  Pulse: (!) 41 83 93 87  Resp: 19 20 18 20   Temp: 97.6 F (36.4 C)  97.9 F (36.6 C) (!) 97 F (36.1 C)  TempSrc: Axillary  Axillary Axillary  SpO2: 100% 98% 100% 100%  Weight:   91.8 kg     Intake/Output Summary (Last 24 hours) at 08/13/2019 0820 Last data  filed at 08/13/2019 0700 Gross per 24 hour  Intake 194.05 ml  Output 850 ml  Net -655.95 ml   Last 3 Weights 08/13/2019 07/31/2019 07/30/2019  Weight (lbs) 202 lb 6.1 oz 194 lb 0.1 oz 194 lb 10.7 oz  Weight (kg) 91.8 kg 88 kg 88.3 kg     Body mass index is 34.74 kg/m.  General: Well developed, well nourished AAF, in no acute distress. Head: Normocephalic, atraumatic, sclera non-icteric, no xanthomas, nares are without discharge. Neck: Negative for carotid bruits. JVP not elevated. Lungs:  Coarse BS throughout on BiPAP without wheezing or rhonchi Breathing is unlabored. Heart: Irregularly irregular, S1 S2 without murmurs, rubs, or gallops.  Abdomen: Soft, non-tender, non-distended with normoactive bowel sounds. No rebound/guarding. Extremities: No clubbing or cyanosis. No edema. Distal pedal pulses are 2+ and equal bilaterally. Neuro: Alert and oriented X 3. Moves all extremities spontaneously. Psych:  Responds to questions appropriately with a normal affect.   EKG:  The EKG was personally reviewed and demonstrates:     Telemetry:  Telemetry was personally reviewed and demonstrates:  Atrial flutter 114bpm, atypical RBBB, nonspecific STT changes  Relevant CV Studies: 2D echo 07/04/19 1. Left ventricular ejection fraction, by estimation, is 30 to 35%. The  left ventricle has moderately decreased function. The left ventricle  demonstrates global hypokinesis. There is mild left ventricular  hypertrophy. Left ventricular diastolic  parameters are indeterminate.  2. Right ventricular systolic function is mildly reduced. The right  ventricular size is mildly enlarged. There is mildly elevated pulmonary  artery systolic pressure.  3. Left atrial size was mildly dilated.  4. Right atrial size was moderately dilated.  5. The mitral valve is normal in structure. Trivial mitral valve  regurgitation. No evidence of mitral stenosis.  6. Tricuspid valve regurgitation is moderate.  7. The  aortic valve is normal in structure. Aortic valve regurgitation is  trivial. Mild aortic valve sclerosis is present, with no evidence of  aortic valve stenosis.  8. Moderately dilated pulmonary artery.   Laboratory Data:  High Sensitivity Troponin:   Recent Labs  Lab 07/18/19 1246 07/19/19 1208 07/19/19 1535 08/12/19 0413 08/12/19 0606  TROPONINIHS 38* 30* 36* 56* 49*     Chemistry Recent Labs  Lab 08/12/19 0413 08/12/19 0427 08/12/19 1102 08/12/19 1657 08/13/19 0233  NA 145   < > 145 144 144  K 4.2   < > 3.7 3.6 3.3*  CL 102  --   --   --  100  CO2 33*  --   --   --  34*  GLUCOSE 154*  --   --   --  136*  BUN 15  --   --   --  20  CREATININE 1.15*  --   --   --  1.29*  CALCIUM 9.1  --   --   --  9.3  GFRNONAA 46*  --   --   --  40*  GFRAA 53*  --   --   --  46*  ANIONGAP 10  --   --   --  10   < > = values in this interval not displayed.    Recent Labs  Lab 08/12/19 0413  PROT 7.0  ALBUMIN 3.3*  AST 50*  ALT 63*  ALKPHOS 95  BILITOT 0.8   Hematology Recent Labs  Lab 08/12/19 0413 08/12/19 0427 08/12/19 1102 08/12/19 1657 08/13/19 0233  WBC 6.3  --   --   --  6.3  RBC 3.85*  --   --   --  3.65*  HGB 10.4*   < > 11.6* 11.6* 9.8*  HCT 36.2   < >  34.0* 34.0* 32.9*  MCV 94.0  --   --   --  90.1  MCH 27.0  --   --   --  26.8  MCHC 28.7*  --   --   --  29.8*  RDW 14.6  --   --   --  14.4  PLT 272  --   --   --  326   < > = values in this interval not displayed.   BNP Recent Labs  Lab 08/12/19 0413 08/13/19 0233  BNP 1,621.7* 943.5*    DDimer No results for input(s): DDIMER in the last 168 hours.   Radiology/Studies:  CT Head Wo Contrast  Result Date: 08/12/2019 CLINICAL DATA:  Altered mental status. Neuro deficit, acute, stroke suspected. EXAM: CT HEAD WITHOUT CONTRAST TECHNIQUE: Contiguous axial images were obtained from the base of the skull through the vertex without intravenous contrast. COMPARISON:  MR head without contrast 10/16/2016.  CT head without contrast 05/04/2016 FINDINGS: Brain: Mild atrophy and white matter changes are stable. No acute infarct, hemorrhage, or mass lesion is present. Basal ganglia are normal. Insular ribbon is normal bilaterally. The ventricles are of normal size. No significant extraaxial fluid collection is present. Vascular: Atherosclerotic calcifications are present in the cavernous internal carotid arteries. No hyperdense vessel is present. Skull: Calvarium is intact. No focal lytic or blastic lesions are present. No significant extracranial soft tissue lesion is present. Sinuses/Orbits: A fluid level is present in the left maxillary sinus. Single left ethmoid air cell is opacified. Mild mucosal thickening is present in the right sphenoid sinus. The paranasal sinuses and mastoid air cells are otherwise clear. The globes and orbits are within normal limits. IMPRESSION: 1. Stable atrophy and white matter disease. This likely reflects the sequela of chronic microvascular ischemia. 2. No acute intracranial abnormality or significant interval change. 3. Left maxillary and ethmoid sinus disease. Electronically Signed   By: San Morelle M.D.   On: 08/12/2019 04:59   DG Chest Port 1 View  Addendum Date: 08/12/2019   ADDENDUM REPORT: 08/12/2019 10:26 ADDENDUM: This study was originally submitted under another patient's name. The original report was issued at 2:05 a.m. The impression stated cardiomegaly and interstitial edema. Trace bilateral pleural effusions. Electronically Signed   By: San Morelle M.D.   On: 08/12/2019 10:26   Result Date: 08/12/2019 CLINICAL DATA:  Shortness of breath.  Altered mental status. EXAM: PORTABLE CHEST 1 VIEW COMPARISON:  One-view chest x-ray 07/15/2019 FINDINGS: Heart is enlarged. Atherosclerotic calcifications are present at the aortic arch. Interstitial edema and bilateral effusions are increasing. Bibasilar airspace opacities are increasing, right greater than left.  IMPRESSION: 1. Cardiomegaly with increasing interstitial edema and bilateral effusions compatible with congestive heart failure. 2. Bibasilar airspace disease, right greater than left. This likely represents atelectasis, infection is not excluded. Electronically Signed: By: San Morelle M.D. On: 08/12/2019 04:36   New York Heart Association (NYHA) Functional Class NYHA Class IV oon arrival, now more consistent with NYHA class II-III   Assessment and Plan:   1. Acute hypoxic respiratory failure requiring BiPAP, likely due in part to acute on chronic systolic CHF - weight up on arrival compared to prior discharge, CXR concerning for interstitial edema, Covid negative - respiratory status appears to be improving on IV Lasix - would continue for now to try and achieve weight closer to prior discharge (194lb / 88kg) - IM plans trial off BiPAP this morning, repeat CXR pending - replete potassium (start 83meq BID) - per  previous notes, plan to manage cardiomyopathy conservatively - IM plans to involve palliative care this admission for [place] which seems appropriate given frequent hospitalizations and poor baseline status  2. Persistent atrial flutter, also with hx of post-termination pauses - with mild RVR on arrival - TSH wnl earlier this month, managed with rate control strategy - HR now better controlled on IV diltiazem at 10mg /hr - Lopressor was changed to Toprol 75mg  daily - will review med regimen with MD - hope to avoid digoxin given h/o SSS, CKD, advanced age, h/o post-termination pauses - suggest to switch back to Eliquis when taking orals reliably  3. Essential HTN - no acute concerns, BPs [id number] systolic  4. Hyperlipidemia - resume statin when reliably taking orals  5. Baseline dementia and cognitive impairment - patient is calm, pleasant and confused this morning  6. CKD stage III - current creatinine appears slightly lower than baseline, do expect some rise with  diuresis  For questions or updates, please contact [place] Please consult [email or web address] for contact info under    Signed, Charlie Pitter, PA-C  08/13/2019 8:20 AM  Attending note  Patient seen and discussed with PA Dunn, I agree with her documentation. 79 yo female history of chronic systolic HF 14-60%,  Aflutter, COPD, dementia, DM, HL, HTN, schizophrenia, mental handicap, nursing home resident admitted with SOB. Due to baseline mental state not able to obtain much history on admission. Evidence of fluid overload and aflutter with elevated rates on admission, cardiology consulted.   WBC 6.3 Hgb 10.4 Plt 272 K 4.2 Cr 1.15 BUN 15 BNP 1600 Lactic acid 0.9  Trop 56-->49 EKG aflutter, elevated rates ABG 7.3/73/154/37 COVID neg CXR cardiomegaty, edema 06/2019 echo LVEF 30-35%, mild RV dysfunction  Acute on chronic systolic HF likely exacerbated by aflutter with elevated rates. Neg 1.2 L since admission, she is on IV lasix 40mg  bid. Mild uptrend in Cr. Breathing improving with diuresis, just taken off bipap this am, continue IV lasix. Agree not a cath candidate, hopefully this is a tachy medicated CM and function will improve with rate control alone.    Aflutter with elevated rates. Started on dilt gtt on admission. With low LVEF would work to get her off dilt drip. Transition to oral lopressor 50mg  every 6 hours, convert back to long acting once rates controlled. Likely change lovenox back to DOAC tomorrow if remains off bipap and taking po regularly today   Carlyle Dolly MD

## 2019-08-13 NOTE — Progress Notes (Signed)
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 EP evaluated switched her to Toprol they did not recommend at that time PPM placement she also carries a diagnosis of paroxysmal A. fibAnd likely sick sinus syndrome Patient discharged from [facility] and readmitted the next day and was discharged eventually after prolonged stay 07/31/2019 --she developed hypoxic respiratory failure again requiring BiPAP on admission and was weaned off apparently after diuresis During that admission she was placed on amiodarone and discontinued off of it because of ST depressions/5-second posttermination pause cardiology was consulted they did not feel that the time any operative/electrophysiological intervention was warranted   Assessment & Plan:   Active Problems:   Cancer of left lung (HCC)   HTN (hypertension)   HLD (hyperlipidemia)   Dementia (HCC)   Type II diabetes mellitus with renal manifestations (HCC)   Atrial flutter (HCC)   Acute on chronic systolic CHF (congestive heart failure) (HCC)   COPD (chronic obstructive pulmonary disease) (HCC)   Acute respiratory failure ([place])   1. Hypoxic respiratory failure 2/2 systolic heart failure EF 30-35% a. Required BiPAP the majority of 7/30 b. PCO2 now down reliably to 50 and mentation improved-weaned to nasal cannula c. Continuing Lasix per cardiology, currently down to 91 kg -1.2 L d. No easy fix-see my long discussion 7/30  with the patient's son-patient has been failing for several months now and hospice is appropriate-palliative care has been consulted to assist with planning 2. Atrial flutter/sick sinus syndrome intolerant of amiodarone 3. Required high doses of Cardizem initially 4. Cardiology is adjusting meds as per their note appreciate input 5. Not a candidate for any type of intervention given severe comorbidities 6. Replacing potassium check a.m. magnesium 7. OHSS habitus a. Previously not using BiPAP however may require this at night depending on goals of care discussions b. We will continue at bedtime BiPAP c. Given hypoxic respiratory failure-have cautiously titrated diet from clear liquids today and advanced as tolerated d. Should she fail again we will probably need to have further discussions with family 8. Schizophrenia/bipolar a. Appears to be on numerous meds with potential for encephalopathy and risk of hypercarbia including Seroquel 100 at bedtime, Namzaric 1 daily, Xanax 0.25 twice daily Trilafon 1 tab 3 times daily all of which have been completely discontinued b. We will continue however her Provigil 100 daily and encourage sleep-wake cycle 9. COPD + lung cancer status post XRT a. Details not completely clear-we will resume Xopenex as needed Flonase b. I do not think needs steroids c. We will also resume Advair or alternate Breo 10.  DM TY 2 a. Continue sliding scale coverage and resume 10 units Lantus b. Recheck sugars a.m. 11. Reflux  DVT prophylaxis: Lovenox weight-based A. fib dosing 1 meg per cake partial code Code Status:  Family Communication:  No family present today  disposition:   Status is: Inpatient  Remains inpatient appropriate because:Inpatient level of care appropriate due to severity of illness  Awaiting palliative care input likely needs to discharge to hospice versus SNF with palliative  following   Dispo: The patient is from: SNF              Anticipated d/c  is to: SNF              Anticipated d/c date is: 3 days              Patient currently is not medically stable to d/c.       Consultants:   Cardiology  Procedures: None  Antimicrobials: No   Subjective: More coherent pleasantly confused verbalizing little better Difficult to obtain history given she is edentulous Seems pleasant otherwise  Objective: Vitals:   08/13/19 1104 08/13/19 1200 08/13/19 1300 08/13/19 1418  BP:    (!) 135/99  Pulse:  87 84 82  Resp:  (!) 25 (!) 25 19  Temp:    97.7 F (36.5 C)  TempSrc:    Oral  SpO2: 100% 100% 100% 100%  Weight:        Intake/Output Summary (Last 24 hours) at 08/13/2019 1549 Last data filed at 08/13/2019 1422 Gross per 24 hour  Intake 852.64 ml  Output 1100 ml  Net -247.36 ml   Filed Weights   08/13/19 0448  Weight: 91.8 kg    Examination:  General exam: Edentulous thick neck Mallampati 4 Respiratory system: Lower lung fields decreased air entry no rales rhonchi  Cardiovascular system: S1-S2 seems to be rate controlled A. fib/flutter on monitors  Gastrointestinal system: Soft nontender no rebound no guarding. Central nervous system: Neurologically moves limbs equally and coherent enough to follow commands Extremities: No lower extremity edema no swelling of joints Skin: No lower extremity edema Psychiatry: Pleasantly confused cannot assess  Data Reviewed: I have personally reviewed following labs and imaging studies Potassium 3.3 BUN/creatinine 20/1.2 Hemoglobin 9.8 BNP 943  Radiology Studies: CT Head Wo Contrast  Result Date: 08/12/2019 CLINICAL DATA:  Altered mental status. Neuro deficit, acute, stroke suspected. EXAM: CT HEAD WITHOUT CONTRAST TECHNIQUE: Contiguous axial images were obtained from the base of the skull through the vertex without intravenous contrast. COMPARISON:  MR head without contrast 10/16/2016. CT head without contrast 05/04/2016 FINDINGS: Brain: Mild atrophy and white matter changes are  stable. No acute infarct, hemorrhage, or mass lesion is present. Basal ganglia are normal. Insular ribbon is normal bilaterally. The ventricles are of normal size. No significant extraaxial fluid collection is present. Vascular: Atherosclerotic calcifications are present in the cavernous internal carotid arteries. No hyperdense vessel is present. Skull: Calvarium is intact. No focal lytic or blastic lesions are present. No significant extracranial soft tissue lesion is present. Sinuses/Orbits: A fluid level is present in the left maxillary sinus. Single left ethmoid air cell is opacified. Mild mucosal thickening is present in the right sphenoid sinus. The paranasal sinuses and mastoid air cells are otherwise clear. The globes and orbits are within normal limits. IMPRESSION: 1. Stable atrophy and white matter disease. This likely reflects the sequela of chronic microvascular ischemia. 2. No acute intracranial abnormality or significant interval change. 3. Left maxillary and ethmoid sinus disease. Electronically Signed   By: San Morelle M.D.   On: 08/12/2019 04:59   DG CHEST PORT 1 VIEW  Result Date: 08/13/2019 CLINICAL DATA:  79 year old female with altered mental status. Acute heart failure. Pneumonia. EXAM: PORTABLE CHEST 1 VIEW COMPARISON:  08/12/2019 FINDINGS: 0633 hours. Patient is rotated to the right. Right apex obscured by overlying face. The cardio pericardial silhouette is enlarged. Interval improvement in basilar lung aeration. Right costophrenic angle not included  on the film. The visualized bony structures of the thorax show now acute abnormality. Degenerative changes noted in both shoulders. Telemetry leads overlie the chest. IMPRESSION: 1. Limited study due to patient rotation lordotic positioning. 2. Interval improvement in basilar lung aeration. 3. Cardiomegaly with vascular congestion Electronically Signed   By: Misty Stanley M.D.   On: 08/13/2019 11:28   DG Chest Port 1  View  Addendum Date: 08/12/2019   ADDENDUM REPORT: 08/12/2019 10:26 ADDENDUM: This study was originally submitted under another patient's name. The original report was issued at 2:05 a.m. The impression stated cardiomegaly and interstitial edema. Trace bilateral pleural effusions. Electronically Signed   By: San Morelle M.D.   On: 08/12/2019 10:26   Result Date: 08/12/2019 CLINICAL DATA:  Shortness of breath.  Altered mental status. EXAM: PORTABLE CHEST 1 VIEW COMPARISON:  One-view chest x-ray 07/15/2019 FINDINGS: Heart is enlarged. Atherosclerotic calcifications are present at the aortic arch. Interstitial edema and bilateral effusions are increasing. Bibasilar airspace opacities are increasing, right greater than left. IMPRESSION: 1. Cardiomegaly with increasing interstitial edema and bilateral effusions compatible with congestive heart failure. 2. Bibasilar airspace disease, right greater than left. This likely represents atelectasis, infection is not excluded. Electronically Signed: By: San Morelle M.D. On: 08/12/2019 04:36     Scheduled Meds: . aspirin EC  81 mg Oral Daily  . enoxaparin (LOVENOX) injection  90 mg Subcutaneous Q12H  . fluticasone furoate-vilanterol  1 puff Inhalation Daily  . furosemide  40 mg Intravenous Q12H  . metoprolol tartrate  50 mg Oral Q6H  . potassium chloride  40 mEq Oral BID  . sodium chloride flush  3 mL Intravenous Q12H   Continuous Infusions: . sodium chloride    . diltiazem (CARDIZEM) infusion 5 mg/hr (08/13/19 1129)     LOS: 1 day    Time spent: [place], MD [facility] To contact the attending provider between 7A-7P or the covering provider during after hours 7P-7A, please log into the web site [email or web address] and access using universal [place] password for that web site. If you do not have the password, please call the hospital operator.  08/13/2019, 3:49 PM

## 2019-08-14 DIAGNOSIS — I509 Heart failure, unspecified: Secondary | ICD-10-CM

## 2019-08-14 DIAGNOSIS — Z7189 Other specified counseling: Secondary | ICD-10-CM

## 2019-08-14 DIAGNOSIS — Z515 Encounter for palliative care: Secondary | ICD-10-CM

## 2019-08-14 LAB — BASIC METABOLIC PANEL
Anion gap: 13 (ref 5–15)
BUN: 14 mg/dL (ref 8–23)
CO2: 32 mmol/L (ref 22–32)
Calcium: 9.1 mg/dL (ref 8.9–10.3)
Chloride: 94 mmol/L — ABNORMAL LOW (ref 98–111)
Creatinine, Ser: 1.08 mg/dL — ABNORMAL HIGH (ref 0.44–1.00)
GFR calc Af Amer: 57 mL/min — ABNORMAL LOW (ref >60)
GFR calc non Af Amer: 49 mL/min — ABNORMAL LOW (ref >60)
Glucose, Bld: 100 mg/dL — ABNORMAL HIGH (ref 70–99)
Potassium: 4.2 mmol/L (ref 3.5–5.1)
Sodium: 139 mmol/L (ref 135–145)

## 2019-08-14 LAB — GLUCOSE, CAPILLARY
Glucose-Capillary: 111 mg/dL — ABNORMAL HIGH (ref 70–99)
Glucose-Capillary: 213 mg/dL — ABNORMAL HIGH (ref 70–99)

## 2019-08-14 LAB — MAGNESIUM: Magnesium: 1.8 mg/dL (ref 1.7–2.4)

## 2019-08-14 MED ORDER — METOPROLOL TARTRATE 5 MG/5ML IV SOLN: 5.0000 mg | Freq: Four times a day (QID) | INTRAVENOUS | Status: DC

## 2019-08-14 MED ORDER — MODAFINIL 100 MG PO TABS
100.0000 mg | ORAL_TABLET | Freq: Every day | ORAL | Status: DC
  Administered 2019-08-14 – 2019-08-16 (×3): 100 mg via ORAL
  Filled 2019-08-14 (×3): qty 1

## 2019-08-14 MED ORDER — METOPROLOL TARTRATE 5 MG/5ML IV SOLN
5.0000 mg | Freq: Four times a day (QID) | INTRAVENOUS | Status: DC
  Administered 2019-08-14 – 2019-08-15 (×5): 5 mg via INTRAVENOUS
  Filled 2019-08-14 (×5): qty 5

## 2019-08-14 MED ORDER — APIXABAN 5 MG PO TABS
5.0000 mg | ORAL_TABLET | Freq: Two times a day (BID) | ORAL | Status: DC
  Administered 2019-08-14 – 2019-08-16 (×4): 5 mg via ORAL
  Filled 2019-08-14 (×4): qty 1

## 2019-08-14 MED ORDER — METOPROLOL TARTRATE 5 MG/5ML IV SOLN: 2.5000 mg | Freq: Four times a day (QID) | INTRAVENOUS | Status: DC | PRN

## 2019-08-14 MED ORDER — METOPROLOL SUCCINATE ER 100 MG PO TB24
100.0000 mg | ORAL_TABLET | Freq: Two times a day (BID) | ORAL | Status: DC
  Administered 2019-08-14 – 2019-08-16 (×5): 100 mg via ORAL
  Filled 2019-08-14 (×5): qty 1

## 2019-08-14 MED ORDER — LEVALBUTEROL HCL 0.63 MG/3ML IN NEBU
0.6300 mg | INHALATION_SOLUTION | Freq: Three times a day (TID) | RESPIRATORY_TRACT | Status: DC
  Administered 2019-08-14 – 2019-08-16 (×7): 0.63 mg via RESPIRATORY_TRACT
  Filled 2019-08-14 (×7): qty 3

## 2019-08-14 MED ORDER — FUROSEMIDE 10 MG/ML IJ SOLN
60.0000 mg | Freq: Two times a day (BID) | INTRAMUSCULAR | Status: DC
  Administered 2019-08-14 – 2019-08-15 (×2): 60 mg via INTRAVENOUS
  Filled 2019-08-14 (×2): qty 6

## 2019-08-14 NOTE — Progress Notes (Signed)
Patient continues to come in and out of a yellow mews score due to high HR. MD aware. Medication ordered. See mar. RN will continue to monitor pt closely.

## 2019-08-14 NOTE — Progress Notes (Signed)
Called regarding afib with RVR  Pt comfortable in bed Breathing stable  Tele shows afib rates not controlled  Current 120s  On po metoprolol  Transitioned off of dilt due to decreased LVEF  Will add intermittent IV and follow response   5 IV q 6 Hours.     Dorris Carnes MD

## 2019-08-14 NOTE — Progress Notes (Signed)
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 EP evaluated switched her to Toprol they did not recommend at that time PPM placement she also carries a diagnosis of paroxysmal A. fibAnd likely sick sinus syndrome Patient discharged from [facility] and readmitted the next day and was discharged eventually after prolonged stay 07/31/2019 --she developed hypoxic respiratory failure again requiring BiPAP on admission and was weaned off apparently after diuresis During that admission she was placed on amiodarone and discontinued off of it because of ST depressions/5-second posttermination pause cardiology was consulted they did not feel that the time any operative/electrophysiological intervention was warranted   Assessment & Plan:   Active Problems:   Cancer of left lung (HCC)   HTN (hypertension)   HLD (hyperlipidemia)   Dementia (HCC)   Type II diabetes mellitus with renal manifestations (HCC)   Atrial flutter (HCC)   Acute on chronic systolic CHF (congestive heart failure) (HCC)   COPD (chronic obstructive pulmonary disease) (HCC)   Acute respiratory failure ([place])   1. Hypoxic respiratory failure 2/2 systolic heart failure EF 30-35% a. Required BiPAP the majority of 7/30 b. Stop ABG's-PCO2 now down reliably to 50 and mentation improved-weaned to nasal cannula c. Lasix 40 IV twice daily fluid status -1.8-weight inaccurate--- SHE PULLED out IV-May benefit from  Demadex resumption if weights are more accurate with standing weights later today per RN who I discussed this with d. No easy fix-see my long discussion 7/30-see below 2. Atrial flutter/sick sinus syndrome intolerant of amiodarone 3. Required high doses of Cardizem initially 4. Placed on metoprolol 50 every 6 per cardiology 5. Currently on Lovenox weight-based for a flutter-likely can transition back to Eliquis 5 twice daily 6. Some tachycardia today-May need adjustment? Per cardiology 7. OHSS habitus a. Previously not using BiPAP -require this at night depending on goals of care discussions later today b. We will continue at bedtime BiPAP c. Diet advanced as tolerated and doing fair on this 731 through 8/1 8. Schizophrenia/bipolar a. Appears to be on numerous meds with potential for encephalopathy and risk of hypercarbia including Seroquel 100 at bedtime, Namzaric 1 daily, Xanax 0.25 twice daily Trilafon 1 tab 3 times daily all of which have been completely discontinued b. Resume Provigil 100 daily and encourage sleep-wake cycle 9. COPD + lung cancer status post XRT a. Details not completely clear-we will resume Xopenex as needed Flonase b. Continuing  Advair or alternate Breo 10.  DM TY 2 a. Continue sliding scale coverage and resume 10 units Lantus b. Sugars 100-1 11 11. Reflux  DVT prophylaxis: Lovenox weight-based A. fib dosing 1mg /k-if reliably taking p.o. transition back to oral anticoagulation Code Status:  Family Communication:  No family present today-might be able to follow-up after palliative care discussion with them with regards to goals of care later today versus tomorrow morning to consolidate planning disposition:   Status is: Inpatient  Remains inpatient appropriate because:Inpatient level of care appropriate due to severity of illness  Awaiting palliative care input likely needs to discharge to hospice versus SNF with  palliative following   Dispo: The patient is  from: SNF              Anticipated d/c is to: SNF              Anticipated d/c date is: 2 days              Patient currently is not medically stable to d/c.       Consultants:   Cardiology  Procedures: None  Antimicrobials: No   Subjective:  Much more coherent oriented no distress EOMI NCAT Can orient to person but seems to get confused at times Asking to eat so we liberalized the diet  Objective: Vitals:   08/14/19 0054 08/14/19 0305 08/14/19 0315 08/14/19 0500  BP: (!) 136/92  (!) 130/84   Pulse:  95    Resp: 15 23 18    Temp: 98.2 F (36.8 C)   98.6 F (37 C)  TempSrc: Axillary   Axillary  SpO2: 100% 100%    Weight:        Intake/Output Summary (Last 24 hours) at 08/14/2019 0900 Last data filed at 08/13/2019 1938 Gross per 24 hour  Intake 658.59 ml  Output 1325 ml  Net -666.41 ml   Filed Weights   08/13/19 0448  Weight: 91.8 kg    Examination:  General exam: thick neck Mallampati 4 no JVD poor dentition Respiratory system: Lower lung fields decreased air entry no rales rhonchi no wheeze Cardiovascular system: S1-S2 rates in the 100s with 3-1 flutter Gastrointestinal system: Soft nontender no rebound no guarding. Central nervous system: Neurologically moves limbs equally and coherent enough to follow commands Extremities: No lower extremity edema no swelling of joints Skin: No lower extremity edema Psychiatry: Pleasantly confused but more orientable today than prior  Data Reviewed: I have personally reviewed following labs and imaging studies Potassium 3.3-->4.2 BUN/creatinine 20/1.2-->14/1.08  Radiology Studies: DG CHEST PORT 1 VIEW  Result Date: 08/13/2019 CLINICAL DATA:  79 year old female with altered mental status. Acute heart failure. Pneumonia. EXAM: PORTABLE CHEST 1 VIEW COMPARISON:  08/12/2019 FINDINGS: 0633 hours. Patient is rotated to the right. Right apex obscured by overlying face. The cardio pericardial silhouette is enlarged. Interval  improvement in basilar lung aeration. Right costophrenic angle not included on the film. The visualized bony structures of the thorax show now acute abnormality. Degenerative changes noted in both shoulders. Telemetry leads overlie the chest. IMPRESSION: 1. Limited study due to patient rotation lordotic positioning. 2. Interval improvement in basilar lung aeration. 3. Cardiomegaly with vascular congestion Electronically Signed   By: Misty Stanley M.D.   On: 08/13/2019 11:28     Scheduled Meds: . enoxaparin (LOVENOX) injection  90 mg Subcutaneous Q12H  . ferrous sulfate  325 mg Oral BID  . fluticasone  2 spray Each Nare Daily  . fluticasone furoate-vilanterol  1 puff Inhalation Daily  . furosemide  40 mg Intravenous Q12H  . guaiFENesin  600 mg Oral BID  . levalbuterol  0.63 mg Nebulization TID  . metoprolol tartrate  50 mg Oral Q6H  . potassium chloride  40 mEq Oral BID  . sodium chloride flush  3 mL Intravenous Q12H   Continuous Infusions: . sodium chloride       LOS: 2 days    Time spent: [place], MD [facility] To contact the attending provider between 7A-7P or the covering provider during after hours 7P-7A, please log into the web site [email or web address] and access using universal [place] password  for that web site. If you do not have the password, please call the hospital operator.  08/14/2019, 9:00 AM

## 2019-08-14 NOTE — Progress Notes (Signed)
Progress Note  Patient Name: Cheryl [name] Date of Encounter: 08/14/2019  [facility] Cardiologist: Kathlyn Sacramento, MD   Subjective   Breathing is improving.   Inpatient Medications    Scheduled Meds: . enoxaparin (LOVENOX) injection  90 mg Subcutaneous Q12H  . ferrous sulfate  325 mg Oral BID  . fluticasone  2 spray Each Nare Daily  . fluticasone furoate-vilanterol  1 puff Inhalation Daily  . furosemide  40 mg Intravenous Q12H  . guaiFENesin  600 mg Oral BID  . levalbuterol  0.63 mg Nebulization TID  . metoprolol tartrate  50 mg Oral Q6H  . potassium chloride  40 mEq Oral BID  . sodium chloride flush  3 mL Intravenous Q12H   Continuous Infusions: . sodium chloride     PRN Meds: sodium chloride, acetaminophen, sodium chloride flush   Vital Signs    Vitals:   08/14/19 0305 08/14/19 0315 08/14/19 0500 08/14/19 0905  BP:  (!) 130/84  (!) 141/99  Pulse: 95   56  Resp: 23 18    Temp:   98.6 F (37 C) 98.9 F (37.2 C)  TempSrc:   Axillary Oral  SpO2: 100%   100%  Weight:        Intake/Output Summary (Last 24 hours) at 08/14/2019 1009 Last data filed at 08/13/2019 1938 Gross per 24 hour  Intake 628.6 ml  Output 625 ml  Net 3.6 ml   Last 3 Weights 08/13/2019 07/31/2019 07/30/2019  Weight (lbs) 202 lb 6.1 oz 194 lb 0.1 oz 194 lb 10.7 oz  Weight (kg) 91.8 kg 88 kg 88.3 kg      Telemetry    Aflutter variable rates - Personally Reviewed  ECG    n/a - Personally Reviewed  Physical Exam   GEN: No acute distress.   Neck: No JVD Cardiac: irreg, no murmurs, rubs, or gallops.  Respiratory: crackles bilateral bases GI: Soft, nontender, non-distended  MS: No edema; No deformity. Neuro:  Nonfocal  Psych: Normal affect   Labs    High Sensitivity Troponin:   Recent Labs  Lab 07/18/19 1246 07/19/19 1208 07/19/19 1535 08/12/19 0413 08/12/19 0606  TROPONINIHS 38* 30* 36* 56* 49*      Chemistry Recent Labs  Lab 08/12/19 0413 08/12/19 0427  08/12/19 1657 08/13/19 0233 08/14/19 0319  NA 145   < > 144 144 139  K 4.2   < > 3.6 3.3* 4.2  CL 102  --   --  100 94*  CO2 33*  --   --  34* 32  GLUCOSE 154*  --   --  136* 100*  BUN 15  --   --  20 14  CREATININE 1.15*  --   --  1.29* 1.08*  CALCIUM 9.1  --   --  9.3 9.1  PROT 7.0  --   --   --   --   ALBUMIN 3.3*  --   --   --   --   AST 50*  --   --   --   --   ALT 63*  --   --   --   --   ALKPHOS 95  --   --   --   --   BILITOT 0.8  --   --   --   --   GFRNONAA 46*  --   --  40* 49*  GFRAA 53*  --   --  46* 57*  ANIONGAP 10  --   --  10 13   < > = values in this interval not displayed.     Hematology Recent Labs  Lab 08/12/19 0413 08/12/19 0427 08/12/19 1102 08/12/19 1657 08/13/19 0233  WBC 6.3  --   --   --  6.3  RBC 3.85*  --   --   --  3.65*  HGB 10.4*   < > 11.6* 11.6* 9.8*  HCT 36.2   < > 34.0* 34.0* 32.9*  MCV 94.0  --   --   --  90.1  MCH 27.0  --   --   --  26.8  MCHC 28.7*  --   --   --  29.8*  RDW 14.6  --   --   --  14.4  PLT 272  --   --   --  326   < > = values in this interval not displayed.    BNP Recent Labs  Lab 08/12/19 0413 08/13/19 0233  BNP 1,621.7* 943.5*     DDimer No results for input(s): DDIMER in the last 168 hours.   Radiology    DG CHEST PORT 1 VIEW  Result Date: 08/13/2019 CLINICAL DATA:  79 year old female with altered mental status. Acute heart failure. Pneumonia. EXAM: PORTABLE CHEST 1 VIEW COMPARISON:  08/12/2019 FINDINGS: 0633 hours. Patient is rotated to the right. Right apex obscured by overlying face. The cardio pericardial silhouette is enlarged. Interval improvement in basilar lung aeration. Right costophrenic angle not included on the film. The visualized bony structures of the thorax show now acute abnormality. Degenerative changes noted in both shoulders. Telemetry leads overlie the chest. IMPRESSION: 1. Limited study due to patient rotation lordotic positioning. 2. Interval improvement in basilar lung aeration.  3. Cardiomegaly with vascular congestion Electronically Signed   By: Misty Stanley M.D.   On: 08/13/2019 11:28    Cardiac Studies    Patient Profile     Cheryl [name] is a 79 y.o. female with a hx of dementia, schizophenia, mental retardation, COPD, DM, GERD, HTN, HLD, lung CA s/p prior XRT, paroxysmal atrial flutter with probable sick sinus syndrome with post-temination pauses, LV dysfunction with chronic systolic CHF, chronic appearing anemia who is being seen today for the evaluation of atrial flutter at the request of Dr. Verlon Au.  Assessment & Plan    1. Acute on chronic systolic HF - likely exacerbated by aflutter with RVR - 06/2019 echo LVEF 30-35%, mild RV dysfunction - not a cath candiate due to advanced age, poor functional status  - initially on bipap, able to come off. No on just 2 L [place] - neg 673mL yesterday per charting, neg 1.8 L since admission.  - she is on IV lasix 40mg  bid, mid fluctuations in renal function without clear trend - increase lasix to 60mg  IV bid   2. Aflutter - changed to lopressor 50mg  every 6 hours yesterday - would try to avoid diltiazem given her systolic dysfunction - she is on lovenox b/c was not taking po regularly when on bipap, will transition back to he home eliquis  - rated did well most of yesterday, increased rates this AM. Change her to toprol 100mg  bid for more steady rate control and also given her systolic dysfunction     Palliative has been consulted for long term goals of care.   For questions or updates, please contact [place] Please consult [email or web address] for contact info under        Signed, Carlyle Dolly, MD  08/14/2019, 10:09 AM

## 2019-08-14 NOTE — Consult Note (Addendum)
Palliative Medicine Inpatient Consult Note  Reason for consult:  Goals of Care "eol discussions--call Cheryl [name]"  HPI:  Per intake H&P -->  Cheryl [name] is a 79 y.o. female with history of COPD, systolic heart failure last EF measured last month was 30 to 35% schizophrenia, chronic kidney disease stage III A. fib who was admitted and discharged yesterday from [facility] during which patient had a prolonged: Patient was admitted for shortness of breath went into atrial flutter with RVR was started on amiodarone and went into bradycardia and pauses electrophysiology cardiology was consulted eventually was felt that patient did not need any pacemaker and was restarted on Toprol discharge yesterday was brought in the ER the patient became acutely short of breath.   Palliative care was asked to get involved to aid in goals of care conversations.   Clinical Assessment/Goals of Care: I have reviewed medical records including EPIC notes, labs and imaging, received report from bedside RN, assessed the patient.    I called Cheryl [name] multiple times throughout the day though I was unable to get into contact with him. In the chart he is enlisted as the patients "legal guardian" though there is no attained paperwork to thoroughly support this.   I later called Cheryl [name] (son) to further discuss diagnosis prognosis, [place], EOL wishes, disposition and options.   I introduced Palliative Medicine as specialized medical care for people living with serious illness. It focuses on providing relief from the symptoms and stress of a serious illness. The goal is to improve quality of life for both the patient and the family.  I asked Cheryl [name] to tell me about his mother. He shares that she is from [place], [place]. They lived in [place] for sometime though his mother during that time got involved with drugs. She has battled mental health issues throughout her life. She has never been  married. She has four sons and one daughter. She had been institutionalized a Occupational psychologist for many years. She per her son has not been safe to live on her own. She most recently had been at [facility].   A detailed discussion was had today regarding advanced directives, there are none on file. Per patients son Cheryl [name] he is "not sure" if these have ever been completed. He shares that often his brother, Cheryl [name] makes decisions for his mother though he can sometimes be "difficult to get in touch with."    Concepts specific to code status, artifical feeding and hydration, continued IV antibiotics and rehospitalization was had.  Patient had been listed a a DNR though her family would be amenable to continuation of Bipap and medications.   The difference between a aggressive medical intervention path  and a palliative comfort care path for this patient at this time was had.  I described hospice as a service for patients for have a life expectancy of < 51months. It preserves dignity and quality at the end phases of life. The focus changes from curative to symptom relief. I shared that this would be a reasonable consideration for Cheryl [name] at this point in time. Cheryl [name] endorsed that he would want his brother, Cheryl [name] to make that decision. He was however open to OP Palliative continuing to follow.  Discussed the importance of continued conversation with family and their  medical providers regarding overall plan of care and treatment options, ensuring decisions are within the context of the patients values and GOCs.  Addendum: Spoke to patient son Cheryl [name]  this evening. He states that he would like the patient to transition back to [facility] with Hospice. A TOC order has been placed.  Decision Maker: Cheryl [name] (son) - [phone]  SUMMARY OF RECOMMENDATIONS   Partial - Bipap and ACLS medications  E-MOST completed  TOC --> Hospice at [facility]: Partial - Bipap and ACLS medications   Symptom Management:  Physical Deconditioning:  - PT/OT consults   Palliative Prophylaxis:   Oral Care, Mobility, Delirium Precuations  Additional Recommendations (Limitations, Scope, Preferences): Continue current scope of care   Psycho-social/Spiritual:   Desire for further Chaplaincy support:  Yes - Christian  Additional Recommendations: Education on hospice care   Prognosis: Patient with four admissions in the last six months. Multiple co-morbid conditions.   Discharge Planning: Discharge to [facility] with OP Palliative follow up  PPS: 30-40%   This conversation/these recommendations were discussed with patient primary care team, Cheryl [name]  Time In: 1020 Time Out: 1130 Total Time: 40 Greater than 50%  of this time was spent counseling and coordinating care related to the above assessment and plan.  [place] Team Team Cell Phone: [phone] Please utilize secure chat with additional questions, if there is no response within 30 minutes please call the above phone number  Palliative Medicine Team providers are available by phone from 7am to 7pm daily and can be reached through the team cell phone.  Should this patient require assistance outside of these hours, please call the patient's attending physician.

## 2019-08-15 DIAGNOSIS — I5023 Acute on chronic systolic (congestive) heart failure: Secondary | ICD-10-CM

## 2019-08-15 DIAGNOSIS — I4892 Unspecified atrial flutter: Secondary | ICD-10-CM

## 2019-08-15 DIAGNOSIS — I5043 Acute on chronic combined systolic (congestive) and diastolic (congestive) heart failure: Secondary | ICD-10-CM

## 2019-08-15 LAB — BASIC METABOLIC PANEL
Anion gap: 10 (ref 5–15)
BUN: 19 mg/dL (ref 8–23)
CO2: 31 mmol/L (ref 22–32)
Calcium: 8.8 mg/dL — ABNORMAL LOW (ref 8.9–10.3)
Chloride: 95 mmol/L — ABNORMAL LOW (ref 98–111)
Creatinine, Ser: 1.44 mg/dL — ABNORMAL HIGH (ref 0.44–1.00)
GFR calc Af Amer: 40 mL/min — ABNORMAL LOW (ref >60)
GFR calc non Af Amer: 35 mL/min — ABNORMAL LOW (ref >60)
Glucose, Bld: 118 mg/dL — ABNORMAL HIGH (ref 70–99)
Potassium: 4.6 mmol/L (ref 3.5–5.1)
Sodium: 136 mmol/L (ref 135–145)

## 2019-08-15 LAB — CBC
HCT: 38.3 % (ref 36.0–46.0)
Hemoglobin: 11.6 g/dL — ABNORMAL LOW (ref 12.0–15.0)
MCH: 27 pg (ref 26.0–34.0)
MCHC: 30.3 g/dL (ref 30.0–36.0)
MCV: 89.1 fL (ref 80.0–100.0)
Platelets: 357 10*3/uL (ref 150–400)
RBC: 4.3 MIL/uL (ref 3.87–5.11)
RDW: 14.3 % (ref 11.5–15.5)
WBC: 7.2 10*3/uL (ref 4.0–10.5)
nRBC: 0.4 % — ABNORMAL HIGH (ref 0.0–0.2)

## 2019-08-15 LAB — SARS CORONAVIRUS 2 (TAT 6-24 HRS): SARS Coronavirus 2: NEGATIVE

## 2019-08-15 MED ORDER — POTASSIUM CHLORIDE CRYS ER 20 MEQ PO TBCR
40.0000 meq | EXTENDED_RELEASE_TABLET | Freq: Every day | ORAL | Status: DC
  Administered 2019-08-16: 40 meq via ORAL
  Filled 2019-08-15: qty 2

## 2019-08-15 MED ORDER — DIGOXIN 0.25 MG/ML IJ SOLN
0.2500 mg | Freq: Four times a day (QID) | INTRAMUSCULAR | Status: AC
  Administered 2019-08-15 (×2): 0.25 mg via INTRAVENOUS
  Filled 2019-08-15 (×2): qty 2

## 2019-08-15 MED ORDER — DIGOXIN 125 MCG PO TABS
0.1250 mg | ORAL_TABLET | Freq: Every day | ORAL | Status: DC
  Administered 2019-08-16: 0.125 mg via ORAL
  Filled 2019-08-15: qty 1

## 2019-08-15 MED ORDER — DIGOXIN 125 MCG PO TABS: 0.1250 mg | ORAL_TABLET | Freq: Every day | ORAL | Status: DC

## 2019-08-15 NOTE — Progress Notes (Signed)
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 EP evaluated switched her to Toprol they did not recommend at that time PPM placement she also carries a diagnosis of paroxysmal A. fibAnd likely sick sinus syndrome Patient discharged from [facility] and readmitted the next day and was discharged eventually after prolonged stay 07/31/2019 --she developed hypoxic respiratory failure again requiring BiPAP on admission and was weaned off apparently after diuresis During that admission she was placed on amiodarone and discontinued off of it because of ST depressions/5-second posttermination pause cardiology was consulted they did not feel that the time any operative/electrophysiological intervention was warranted   Assessment & Plan:   Active Problems:   Cancer of left lung (HCC)   HTN (hypertension)   HLD (hyperlipidemia)   Dementia (HCC)   Type II diabetes mellitus with renal manifestations (HCC)   Atrial flutter (HCC)   Acute on chronic systolic CHF (congestive heart failure) (HCC)   COPD (chronic obstructive pulmonary disease) (HCC)   Acute respiratory failure ([place])   Palliative care by specialist   DNR (do not resuscitate) discussion   Goals of care, counseling/discussion   Acute on chronic congestive heart failure ([employer] [place] [employer])   1. Hypoxic respiratory failure 2/2 systolic heart failure EF 30-35% a. Required BiPAP the majority of 7/30 b. Stop ABG's-PCO2 now down  reliably to 50 and mentation improved-weaned to nasal cannula c. Diuretics held secondary to rising creatinine 8/2 -2.8 L since admission, weight 91-->84.5 kg d. No easy fix-see my long discussion 7/30-see below 2. Atrial flutter/sick sinus syndrome intolerant of amiodarone a. Medications adjusted by cardiology  b. currently being loaded with digoxin 8/2 in addition to Toprol-XL 100 twice daily 3. OHSS habitus a. Previously not using BiPAP b. We will continue at bedtime BiPAP at settings determined here c. Diet currently being tolerated without concerns for aspiration or hypoxia 4. Schizophrenia/bipolar a. Stop polypharmacy and discontinued this admit Seroquel 100 at bedtime, Namzaric 1 daily, Xanax 0.25 twice daily Trilafon 1 tab 3 times b. Resume Provigil 100 daily and encourage sleep-wake cycle 5. COPD + lung cancer status post XRT a. Details not completely clear-we will resume Xopenex as needed Flonase b. Continuing  Advair or alternate Breo 6.  DM TY 2 a. Continue sliding scale coverage and resume 10 units Lantus b. Sugars 118-->213 eating 100% of meals today 7. Reflux  DVT prophylaxis: Lovenox weight-based A. fib dosing 1mg /k-if reliably taking p.o. transition back to oral anticoagulation Code Status:  Family Communication:  Discharge likely in a.m. to [facility] facility with hospice services disposition:   Status is: Inpatient  Remains inpatient appropriate because:Inpatient level of care appropriate due to severity of illness    Dispo: The patient is from: SNF              Anticipated d/c is to: SNF              Anticipated d/c date is: 1 day              Patient currently is not medically stable to  d/c.       Consultants:   Cardiology  Procedures: None  Antimicrobials: No   Subjective:  Coherent eating drinking minimally verbal however as slightly garbled speech Follows commands well No new complaints Monitor show sinus tachycardia SVT 120 range when  I saw  Objective: Vitals:   08/15/19 0420 08/15/19 0804 08/15/19 0934 08/15/19 1213  BP:  (!) 131/103  112/88  Pulse:  (!) 126  66  Resp:  22  20  Temp: (!) 97.5 F (36.4 C) 97.6 F (36.4 C)  98.3 F (36.8 C)  TempSrc:  Axillary  Oral  SpO2:  97% 96% 100%  Weight: 84.5 kg       Intake/Output Summary (Last 24 hours) at 08/15/2019 1442 Last data filed at 08/15/2019 0800 Gross per 24 hour  Intake 123 ml  Output 1700 ml  Net -1577 ml   Filed Weights   08/13/19 0448 08/14/19 1131 08/15/19 0420  Weight: 91.8 kg 88.4 kg 84.5 kg    Examination:  General exam: thick neck Mallampati 4 no JVD poor dentition Respiratory system: Lower lung fields decreased air entry no rales rhonchi no wheeze Cardiovascular system: S1-S2 rates in the 100s with 3-1 flutter Gastrointestinal system: Soft nontender no rebound no guarding. Central nervous system: Neurologically moves limbs equally and coherent enough to follow commands Extremities: No lower extremity edema no swelling of joints Skin: No lower extremity edema Psychiatry: Pleasantly confused but more orientable today than prior  Data Reviewed: I have personally reviewed following labs and imaging studies Potassium 3.3-->4.2-->4.6 BUN/creatinine 20/1.2-->14/1.08-->19/1.4  Radiology Studies: No results found.   Scheduled Meds: . apixaban  5 mg Oral BID  . digoxin  0.25 mg Intravenous Q6H  . [START ON 08/16/2019] digoxin  0.125 mg Oral Daily  . ferrous sulfate  325 mg Oral BID  . fluticasone  2 spray Each Nare Daily  . fluticasone furoate-vilanterol  1 puff Inhalation Daily  . guaiFENesin  600 mg Oral BID  . levalbuterol  0.63 mg Nebulization TID  . metoprolol succinate  100 mg Oral BID  . metoprolol tartrate  5 mg Intravenous Q6H  . modafinil  100 mg Oral Daily  . [START ON 08/16/2019] potassium chloride  40 mEq Oral Daily  . sodium chloride flush  3 mL Intravenous Q12H   Continuous Infusions: . sodium chloride       LOS: 3 days      Time spent: [place], MD [facility] To contact the attending provider between 7A-7P or the covering provider during after hours 7P-7A, please log into the web site [email or web address] and access using universal [place] password for that web site. If you do not have the password, please call the hospital operator.  08/15/2019, 2:42 PM

## 2019-08-15 NOTE — TOC Initial Note (Signed)
Transition of Care [facility]) - Initial/Assessment Note    Patient Details  Name: Cheryl [name] MRN: [id number] Date of Birth: 10/12/1940  Transition of [facility]) CM/SW Contact:    Benard Halsted, LCSW Phone Number: 08/15/2019, 10:15 AM  Clinical Narrative:                 CSW received consult from Palliative to see if [facility] would accept patient back with hospice. CSW spoke with Careers adviser. He reported that they are unable to meet patient's medical and care needs, even with hospice. He requests patient return to [facility]. CSW spoke with admissions, Olivia Mackie, at [facility]. She reported that patient can return there with hospice services. CSW spoke with patient's son, Cheryl [name] and advised him of the above. Cheryl [name] is in agreement with the plan to return to [facility] with [facility] and would like to be notified when patient discharges. Other family will be coming to town to visit the patient at [place] this weekend.   Expected Discharge Plan: Skilled Nursing Facility Barriers to Discharge: Continued Medical Work up   Patient Goals and CMS Choice Patient states their goals for this hospitalization and ongoing recovery are:: Hospice CMS Medicare.gov Compare Post Acute Care list provided to:: Patient Represenative (must comment) (Son, Cheryl [name]) Choice offered to / list presented to : Adult Children  Expected Discharge Plan and Services Expected Discharge Plan: [place] In-house Referral: Clinical Social Work, Hospice / [place] Acute Care Choice: Hospice Living arrangements for the past 2 months: [place], [place]                                      Prior Living Arrangements/Services Living arrangements for the past 2 months: [place], [place] Lives with:: Facility Resident Patient language and need for interpreter reviewed:: Yes Do you feel safe going back to the place where you live?: Yes       Need for Family Participation in Patient Care: Yes (Comment) Care giver support system in place?: Yes (comment) Current home services: DME Criminal Activity/Legal Involvement Pertinent to Current Situation/Hospitalization: No - Comment as needed  Activities of Daily Living      Permission Sought/Granted Permission sought to share information with : Facility Sport and exercise psychologist, Family Supports Permission granted to share information with : No  Share Information with NAME: Cheryl [name]  Permission granted to share info w AGENCY: [facility]  Permission granted to share info w Relationship: Son  Permission granted to share info w Contact Information: [phone]  Emotional Assessment Appearance:: Appears stated age Attitude/Demeanor/Rapport: Unable to Assess Affect (typically observed): Unable to Assess Orientation: : Oriented to Self Alcohol / Substance Use: Not Applicable Psych Involvement: No (comment)  Admission diagnosis:  Acute respiratory failure (HCC) [J96.00] Acute respiratory failure with hypoxia and hypercapnia (HCC) [J96.01, J96.02] Acute on chronic congestive heart failure, unspecified heart failure type ([place]) [I50.9] Patient Active Problem List   Diagnosis Date Noted   Palliative care by specialist    DNR (do not resuscitate) discussion    Goals of care, counseling/discussion    Acute on chronic congestive heart failure (HCC)    COPD (chronic obstructive pulmonary disease) ([place]) 08/12/2019   Acute respiratory failure ([place]) 08/12/2019   Acute on chronic systolic CHF (congestive heart failure) ([place]) 07/15/2019   Atrial flutter (HCC)    Sinus node dysfunction (HCC)    Persistent  atrial fibrillation (HCC)    Systolic heart failure (HCC)    Dementia (HCC)    Type II diabetes mellitus with renal manifestations (HCC)    Elevated troponin    Acute on chronic respiratory failure with hypoxia (HCC)    Acute renal failure superimposed on stage 3a  chronic kidney disease (HCC)    Acute respiratory failure with hypoxia (HCC)    Shortness of breath    COPD with acute exacerbation ([place]) 05/09/2019   Respiratory failure with hypoxia and hypercapnia ([place]) 05/09/2019   Hyperglycemia due to diabetes mellitus ([place]) 05/09/2019   GERD (gastroesophageal reflux disease) 05/09/2019   HTN (hypertension) 05/09/2019   HLD (hyperlipidemia) 05/09/2019   Moderate dementia, with behavioral disturbance ([place]) 10/06/2016   Incontinence of feces 05/30/2016   COPD exacerbation ([place]) 01/08/2016   Hypokalemia 02/07/2015   Cancer of left lung ([place]) 07/25/2011   PCP:  Letta Median, MD Pharmacy:   [place], [place] [facility] [address] [place] [address] Phone: [phone] Fax: [phone]     Social Determinants of Health (SDOH) Interventions    Readmission Risk Interventions Readmission Risk Prevention Plan 08/15/2019 05/10/2019  Transportation Screening Complete Complete  PCP or Specialist Appt within 3-5 Days - Complete  HRI or [place] - Complete  Medication Review (RN Care Manager) Referral to Pharmacy Complete  PCP or Specialist appointment within 3-5 days of discharge Complete -  [place] or Home Care Consult Complete -  SW Recovery Care/Counseling Consult Complete -  Palliative Care Screening Complete -  Skilled Nursing Facility Complete -  Some recent data might be hidden

## 2019-08-15 NOTE — Progress Notes (Addendum)
Progress Note  Patient Name: Cheryl [name] Date of Encounter: 08/15/2019  [facility] Cardiologist: Kathlyn [place], MD   Subjective   Says she is having pain, but cannot point to where it is She is eating breakfast and is not in acute distress  Inpatient Medications    Scheduled Meds: . apixaban  5 mg Oral BID  . ferrous sulfate  325 mg Oral BID  . fluticasone  2 spray Each Nare Daily  . fluticasone furoate-vilanterol  1 puff Inhalation Daily  . furosemide  60 mg Intravenous Q12H  . guaiFENesin  600 mg Oral BID  . levalbuterol  0.63 mg Nebulization TID  . metoprolol succinate  100 mg Oral BID  . metoprolol tartrate  5 mg Intravenous Q6H  . modafinil  100 mg Oral Daily  . potassium chloride  40 mEq Oral BID  . sodium chloride flush  3 mL Intravenous Q12H   Continuous Infusions: . sodium chloride     PRN Meds: sodium chloride, acetaminophen, sodium chloride flush   Vital Signs    Vitals:   08/14/19 2258 08/15/19 0102 08/15/19 0418 08/15/19 0420  BP: (!) 134/96 (!) 130/98 (!) 125/97   Pulse: (!) 117 (!) 125 (!) 125   Resp: 19  19   Temp:  98 F (36.7 C) (!) 97.5 F (36.4 C) (!) 97.5 F (36.4 C)  TempSrc:  Oral Axillary   SpO2: 100% 98% 100%   Weight:    84.5 kg    Intake/Output Summary (Last 24 hours) at 08/15/2019 0752 Last data filed at 08/15/2019 0442 Gross per 24 hour  Intake 693 ml  Output 1000 ml  Net -307 ml   Last 3 Weights 08/15/2019 08/14/2019 08/13/2019  Weight (lbs) 186 lb 3.2 oz 194 lb 14.4 oz 202 lb 6.1 oz  Weight (kg) 84.46 kg 88.406 kg 91.8 kg      Telemetry    Atrial flutter, rate generally 90-110 - Personally Reviewed  ECG    None today - Personally Reviewed  Physical Exam   GEN: No acute distress.   Neck:  Minimal JVD Cardiac:  Irregular rate and rhythm, no murmur, no rubs, or gallops.  Respiratory: diminished to auscultation bilaterally with few crackles in the bases GI: Soft, nontender, non-distended  MS: No edema; No  deformity. Neuro:  Nonfocal  Psych: Normal affect   Labs    High Sensitivity Troponin:   Recent Labs  Lab 07/18/19 1246 07/19/19 1208 07/19/19 1535 08/12/19 0413 08/12/19 0606  TROPONINIHS 38* 30* 36* 56* 49*      Chemistry Recent Labs  Lab 08/12/19 0413 08/12/19 0427 08/13/19 0233 08/14/19 0319 08/15/19 0401  NA 145   < > 144 139 136  K 4.2   < > 3.3* 4.2 4.6  CL 102   < > 100 94* 95*  CO2 33*   < > 34* 32 31  GLUCOSE 154*   < > 136* 100* 118*  BUN 15   < > 20 14 19   CREATININE 1.15*   < > 1.29* 1.08* 1.44*  CALCIUM 9.1   < > 9.3 9.1 8.8*  PROT 7.0  --   --   --   --   ALBUMIN 3.3*  --   --   --   --   AST 50*  --   --   --   --   ALT 63*  --   --   --   --   ALKPHOS 95  --   --   --   --  BILITOT 0.8  --   --   --   --   GFRNONAA 46*   < > 40* 49* 35*  GFRAA 53*   < > 46* 57* 40*  ANIONGAP 10   < > 10 13 10    < > = values in this interval not displayed.     Hematology Recent Labs  Lab 08/12/19 0413 08/12/19 0427 08/12/19 1657 08/13/19 0233 08/15/19 0401  WBC 6.3  --   --  6.3 7.2  RBC 3.85*  --   --  3.65* 4.30  HGB 10.4*   < > 11.6* 9.8* 11.6*  HCT 36.2   < > 34.0* 32.9* 38.3  MCV 94.0  --   --  90.1 89.1  MCH 27.0  --   --  26.8 27.0  MCHC 28.7*  --   --  29.8* 30.3  RDW 14.6  --   --  14.4 14.3  PLT 272  --   --  326 357   < > = values in this interval not displayed.    BNP Recent Labs  Lab 08/12/19 0413 08/13/19 0233  BNP 1,621.7* 943.5*     DDimer No results for input(s): DDIMER in the last 168 hours.   Radiology    No results found.  Cardiac Studies   ECHO: 07/04/2019 1. Left ventricular ejection fraction, by estimation, is 30 to 35%. The  left ventricle has moderately decreased function. The left ventricle  demonstrates global hypokinesis. There is mild left ventricular  hypertrophy. Left ventricular diastolic parameters are indeterminate.  2. Right ventricular systolic function is mildly reduced. The right  ventricular  size is mildly enlarged. There is mildly elevated pulmonary  artery systolic pressure.  3. Left atrial size was mildly dilated.  4. Right atrial size was moderately dilated.  5. The mitral valve is normal in structure. Trivial mitral valve  regurgitation. No evidence of mitral stenosis.  6. Tricuspid valve regurgitation is moderate.  7. The aortic valve is normal in structure. Aortic valve regurgitation is  trivial. Mild aortic valve sclerosis is present, with no evidence of  aortic valve stenosis.  8. Moderately dilated pulmonary artery.   Patient Profile     Cheryl [name] is a 79 y.o. female with a hx of dementia, schizophenia, mental retardation, COPD, DM, GERD, HTN, HLD, lung CA s/p prior XRT, paroxysmal atrial flutter with probable sick sinus syndrome with post-temination pauses, LV dysfunction with chronic systolic CHF, chronic appearing anemia who was admitted 07/30 with CHF and atrial flutter.  Assessment & Plan    1. 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 Aflutter -On Cardizem, but it was discontinued secondary to lower EF -Beta-blocker uptitrated to Toprol-XL 100 mg twice daily -She is also on Lopressor 5 mg IV every 6 hours, no doses missed -Discuss plan with MD since her heart rate increases with minimal activity despite high doses of beta-blocker>>Dr Gardiner Rhyme recommends Dig, Pharmacist gave dosing recs. - she will need a dig level next Monday -Continue anticoagulation with Eliquis  Otherwise, per IM  Palliative has seen  for long term goals  of care.   For questions or updates, please contact [place] Please consult [email or web address] for contact info under      Signed, Rosaria Ferries, PA-C  08/15/2019, 7:52 AM     Patient seen and examined.  Agree with above documentation.  Denies any dyspnea.  On exam, patient is oriented to person only, tachycardic, irregular rhythm, no murmurs, lungs CTAB, no LE edema or JVD.  Telemetry personally reviewed, shows atrial fibrillation with rates 90s to 120s.  Also appears to go into atrial flutter at one point with rates in 120s.  IV Lopressor was added in addition to p.o. metoprolol yesterday.  Will discuss with pharmacy and start digoxin load, will plan to wean off IV metoprolol.  Diuresing, weight down 16 pounds from admission.  Bump in creatinine today, will hold p.m. dose of Lasix.  Donato Heinz, MD

## 2019-08-15 NOTE — TOC Progression Note (Signed)
Transition of Care [facility]) - Progression Note    Patient Details  Name: Ladina Shutters MRN: [id number] Date of Birth: March 06, 1940  Transition of Care [facility]) CM/SW [place], LCSW Phone Number: 08/15/2019, 11:16 AM  Clinical Narrative:    [facility] ordering Bipap machine and it will arrive tomorrow. CSW provided settings documented in Flowsheets.    Expected Discharge Plan: [place] Barriers to Discharge: Continued Medical Work up  Expected Discharge Plan and Services Expected Discharge Plan: [place] In-house Referral: Clinical Social Work, Hospice / [place] Acute Care Choice: Hospice Living arrangements for the past 2 months: [place], [place]                                       Social Determinants of Health (SDOH) Interventions    Readmission Risk Interventions Readmission Risk Prevention Plan 08/15/2019 05/10/2019  Transportation Screening Complete Complete  PCP or Specialist Appt within 3-5 Days - Complete  HRI or [place] - Complete  Medication Review (RN Care Manager) Referral to Pharmacy Complete  PCP or Specialist appointment within 3-5 days of discharge Complete -  [place] or Home Care Consult Complete -  SW Recovery Care/Counseling Consult Complete -  Palliative Care Screening Complete -  [place] Complete -  Some recent data might be hidden

## 2019-08-15 NOTE — NC FL2 (Signed)
[place] LEVEL OF CARE SCREENING TOOL     IDENTIFICATION  Patient Name: Cheryl [name] Birthdate: [birth year] Sex: female Admission Date (Current Location): 08/12/2019  [facility] and [place] Number:  Herbalist and Address:  [facility], [place] [address], [place], [place] [address]      Provider Number: [id number]  Attending Physician Name and Address:  Nita Sells, MD  Relative Name and Phone Number:  [name], son, [phone]    Current Level of Care: Hospital Recommended Level of Care: [place] Prior Approval Number:    Date Approved/Denied:   PASRR Number: [id number]  Discharge Plan: SNF    Current Diagnoses: Patient Active Problem List   Diagnosis Date Noted  . Palliative care by specialist   . DNR (do not resuscitate) discussion   . Goals of care, counseling/discussion   . Acute on chronic congestive heart failure ([place])   . COPD (chronic obstructive pulmonary disease) ([place]) 08/12/2019  . Acute respiratory failure ([place]) 08/12/2019  . Acute on chronic systolic CHF (congestive heart failure) ([place]) 07/15/2019  . Atrial flutter ([place])   . Sinus node dysfunction (HCC)   . Persistent atrial fibrillation ([place])   . Systolic heart failure ([place])   . Dementia ([place])   . Type II diabetes mellitus with renal manifestations ([place])   . Elevated troponin   . Acute on chronic respiratory failure with hypoxia ([place])   . Acute renal failure superimposed on stage 3a chronic kidney disease ([place])   . Acute respiratory failure with hypoxia ([place])   . Shortness of breath   . COPD with acute exacerbation ([place]) 05/09/2019  . Respiratory failure with hypoxia and hypercapnia ([place]) 05/09/2019  . Hyperglycemia due to diabetes mellitus ([place]) 05/09/2019  . GERD (gastroesophageal reflux disease) 05/09/2019  . HTN (hypertension) 05/09/2019  . HLD (hyperlipidemia) 05/09/2019  . Moderate dementia, with behavioral disturbance  ([place]) 10/06/2016  . Incontinence of feces 05/30/2016  . COPD exacerbation ([place]) 01/08/2016  . Hypokalemia 02/07/2015  . Cancer of left lung ([place]) 07/25/2011    Orientation RESPIRATION BLADDER Height & Weight     Self  O2 (Nasal cannula 2L) Incontinent, External catheter Weight: 186 lb 3.2 oz (84.5 kg) Height:     BEHAVIORAL SYMPTOMS/MOOD NEUROLOGICAL BOWEL NUTRITION STATUS      Incontinent    AMBULATORY STATUS COMMUNICATION OF NEEDS Skin   Extensive Assist Verbally Other (Comment) (Non pressure wound on coccyx)                       Personal Care Assistance Level of Assistance  Bathing, Feeding, Dressing Bathing Assistance: Maximum assistance Feeding assistance: Limited assistance Dressing Assistance: Maximum assistance     Functional Limitations Info  Sight, Hearing, Speech Sight Info: Impaired Hearing Info: Adequate Speech Info: Adequate    SPECIAL CARE FACTORS FREQUENCY                       Contractures Contractures Info: Not present    Additional Factors Info  Allergies, Code Status, Psychotropic, Insulin Sliding Scale Code Status Info: Partial Allergies Info: NKA Psychotropic Info: See dc summary Insulin Sliding Scale Info: see dc summary       Current Medications (08/15/2019):  This is the current hospital active medication list Current Facility-Administered Medications  Medication Dose Route Frequency Provider Last Rate Last Admin  . 0.9 %  sodium chloride infusion  250 mL Intravenous PRN Quintella Baton, MD      .  acetaminophen (TYLENOL) tablet 650 mg  650 mg Oral Q4H PRN Crosley, Debby, MD      . apixaban (ELIQUIS) tablet 5 mg  5 mg Oral BID Arnoldo Lenis, MD   5 mg at 08/15/19 8563  . ferrous sulfate tablet 325 mg  325 mg Oral BID Nita Sells, MD   325 mg at 08/15/19 0823  . fluticasone (FLONASE) 50 MCG/ACT nasal spray 2 spray  2 spray Each Nare Daily Nita Sells, MD   2 spray at 08/14/19 0910  . fluticasone  furoate-vilanterol (BREO ELLIPTA) 200-25 MCG/INH 1 puff  1 puff Inhalation Daily Crosley, Debby, MD   1 puff at 08/15/19 0937  . furosemide (LASIX) injection 60 mg  60 mg Intravenous Q12H Arnoldo Lenis, MD   60 mg at 08/15/19 1497  . guaiFENesin (MUCINEX) 12 hr tablet 600 mg  600 mg Oral BID Nita Sells, MD   600 mg at 08/15/19 0823  . levalbuterol (XOPENEX) nebulizer solution 0.63 mg  0.63 mg Nebulization TID Nita Sells, MD   0.63 mg at 08/15/19 0933  . metoprolol succinate (TOPROL-XL) 24 hr tablet 100 mg  100 mg Oral BID Arnoldo Lenis, MD   100 mg at 08/15/19 0263  . metoprolol tartrate (LOPRESSOR) injection 5 mg  5 mg Intravenous Q6H Fay Records, MD   5 mg at 08/15/19 7858  . modafinil (PROVIGIL) tablet 100 mg  100 mg Oral Daily Nita Sells, MD   100 mg at 08/15/19 0823  . [START ON 08/16/2019] potassium chloride SA (KLOR-CON) CR tablet 40 mEq  40 mEq Oral Daily Barrett, Rhonda G, PA-C      . sodium chloride flush (NS) 0.9 % injection 3 mL  3 mL Intravenous Q12H Crosley, Debby, MD   3 mL at 08/15/19 0823  . sodium chloride flush (NS) 0.9 % injection 3 mL  3 mL Intravenous PRN Quintella Baton, MD   3 mL at 08/13/19 1125     Discharge Medications: Please see discharge summary for a list of discharge medications.  Relevant Imaging Results:  Relevant Lab Results:   Additional Information SSN: [id number] Please add Hospice at SNF.  Benard Halsted, LCSW

## 2019-08-16 LAB — RENAL FUNCTION PANEL
Albumin: 3 g/dL — ABNORMAL LOW (ref 3.5–5.0)
Anion gap: 12 (ref 5–15)
BUN: 20 mg/dL (ref 8–23)
CO2: 29 mmol/L (ref 22–32)
Calcium: 8.8 mg/dL — ABNORMAL LOW (ref 8.9–10.3)
Chloride: 97 mmol/L — ABNORMAL LOW (ref 98–111)
Creatinine, Ser: 1.23 mg/dL — ABNORMAL HIGH (ref 0.44–1.00)
GFR calc Af Amer: 49 mL/min — ABNORMAL LOW (ref >60)
GFR calc non Af Amer: 42 mL/min — ABNORMAL LOW (ref >60)
Glucose, Bld: 111 mg/dL — ABNORMAL HIGH (ref 70–99)
Phosphorus: 3.2 mg/dL (ref 2.5–4.6)
Potassium: 4.2 mmol/L (ref 3.5–5.1)
Sodium: 138 mmol/L (ref 135–145)

## 2019-08-16 LAB — BASIC METABOLIC PANEL
Anion gap: 12 (ref 5–15)
BUN: 21 mg/dL (ref 8–23)
CO2: 29 mmol/L (ref 22–32)
Calcium: 8.9 mg/dL (ref 8.9–10.3)
Chloride: 97 mmol/L — ABNORMAL LOW (ref 98–111)
Creatinine, Ser: 1.22 mg/dL — ABNORMAL HIGH (ref 0.44–1.00)
GFR calc Af Amer: 49 mL/min — ABNORMAL LOW (ref >60)
GFR calc non Af Amer: 43 mL/min — ABNORMAL LOW (ref >60)
Glucose, Bld: 110 mg/dL — ABNORMAL HIGH (ref 70–99)
Potassium: 4.3 mmol/L (ref 3.5–5.1)
Sodium: 138 mmol/L (ref 135–145)

## 2019-08-16 MED ORDER — POTASSIUM CHLORIDE CRYS ER 20 MEQ PO TBCR
40.0000 meq | EXTENDED_RELEASE_TABLET | Freq: Every day | ORAL | 0 refills | Status: DC
Start: 2019-08-16 — End: 2019-10-12

## 2019-08-16 MED ORDER — TORSEMIDE 20 MG PO TABS
20.0000 mg | ORAL_TABLET | Freq: Every day | ORAL | Status: DC
  Administered 2019-08-16: 20 mg via ORAL
  Filled 2019-08-16: qty 1

## 2019-08-16 MED ORDER — TORSEMIDE 20 MG PO TABS: 20.0000 mg | ORAL_TABLET | Freq: Every day | ORAL | Status: DC

## 2019-08-16 MED ORDER — ALPRAZOLAM 0.25 MG PO TABS: 0.2500 mg | ORAL_TABLET | Freq: Two times a day (BID) | ORAL | 0 refills | Status: DC

## 2019-08-16 MED ORDER — DIGOXIN 125 MCG PO TABS: 0.1250 mg | ORAL_TABLET | Freq: Every day | ORAL | Status: DC

## 2019-08-16 MED ORDER — METOPROLOL SUCCINATE ER 100 MG PO TB24: 100.0000 mg | ORAL_TABLET | Freq: Two times a day (BID) | ORAL | Status: AC

## 2019-08-16 MED ORDER — LEVALBUTEROL HCL 0.63 MG/3ML IN NEBU: 0.6300 mg | INHALATION_SOLUTION | Freq: Four times a day (QID) | RESPIRATORY_TRACT | Status: DC | PRN

## 2019-08-16 NOTE — Progress Notes (Signed)
Attempted to call report to [facility] where patient is being transferred. Called [phone]. Patient is going to [facility]. No answer when called to area, left name and number Dina Rich [phone]) for staff to call me for report.

## 2019-08-16 NOTE — TOC Progression Note (Addendum)
Transition of Care [facility]) - Progression Note    Patient Details  Name: Cheryl [name] MRN: [id number] Date of Birth: Jan 26, 1940  Transition of Care [facility]) CM/SW [place], [place] Phone Number: 08/16/2019, 11:04 AM  Clinical Narrative:     Update 8/3 2:20pm: Bipap has been delivered to facility. [facility] Mack[facility] said that she will get in contact with hospice for patient to follow them at [facility]. Olivia Mackie said she has to get in contact with who they are in contract with. Olivia Mackie will be able to get the name of who they are in contract with when business office opens. CSW spoke with Olivia Mackie from [facility]. The Bipap has not arrived to facility yet. Olivia Mackie is anticipating that it will arrive today. Olivia Mackie will let CSW know as soon as it arrives. CSW let physician know and will keep him updated.  CSW will continue to follow.  Expected Discharge Plan: [place] Barriers to Discharge: No Barriers Identified  Expected Discharge Plan and Services Expected Discharge Plan: [place] In-house Referral: Clinical Social Work, Hospice / [place] Acute Care Choice: Hospice Living arrangements for the past 2 months: [place], [place] Expected Discharge Date: 08/16/19                                     Social Determinants of Health (SDOH) Interventions    Readmission Risk Interventions Readmission Risk Prevention Plan 08/15/2019 05/10/2019  Transportation Screening Complete Complete  PCP or Specialist Appt within 3-5 Days - Complete  HRI or [place] - Complete  Medication Review (RN Care Manager) Referral to Pharmacy Complete  PCP or Specialist appointment within 3-5 days of discharge Complete -  [place] or Home Care Consult Complete -  SW Recovery Care/Counseling Consult Complete -  Palliative Care Screening Complete -  [place] Complete -  Some recent data might be hidden

## 2019-08-16 NOTE — TOC Transition Note (Addendum)
Transition of Care [facility]) - CM/SW Discharge Note   Patient Details  Name: Cheryl [name] MRN: [id number] Date of Birth: 1940/12/23  Transition of Care [facility]) CM/SW Contact:  Trula Ore, [place] Phone Number: 08/16/2019, 2:17 PM   Clinical Narrative:     [facility]  Anticipated DC date: 08/16/2019  Family notified: [name] and Richardson Landry  Transport by: Corey Harold  ?  Per MD patient ready for DC to [facility] with hospice services to follow. Bipap has been delivered to facility. RN, patient, patient's family, and facility notified of DC. Discharge Summary sent to facility. RN given number for report tele#[phone] RM#[phone]. DC packet on chart. Ambulance transport requested for patient.  CSW signing off.  Final next level of care: Skilled Nursing Facility Barriers to Discharge: No Barriers Identified   Patient Goals and CMS Choice Patient states their goals for this hospitalization and ongoing recovery are:: SNF CMS Medicare.gov Compare Post Acute Care list provided to:: Patient Represenative (must comment) (Son, [name]) Choice offered to / list presented to : Adult Children [name])  Discharge Placement              Patient chooses bed at: [facility] Patient to be transferred to facility by: [place] Name of family member notified: [name] Patient and family notified of of transfer: 08/16/19  Discharge Plan and Services In-house Referral: Clinical Social Work, Hospice / Palliative Care   Post Acute Care Choice: Hospice                               Social Determinants of Health (SDOH) Interventions     Readmission Risk Interventions Readmission Risk Prevention Plan 08/15/2019 05/10/2019  Transportation Screening Complete Complete  PCP or Specialist Appt within 3-5 Days - Complete  HRI or [place] - Complete  Medication Review (RN Care Manager) Referral to Pharmacy Complete  PCP or Specialist appointment within 3-5 days of discharge  Complete -  [place] or Home Care Consult Complete -  SW Recovery Care/Counseling Consult Complete -  Palliative Care Screening Complete -  [place] Complete -  Some recent data might be hidden

## 2019-08-16 NOTE — Discharge Summary (Addendum)
Physician Discharge Summary  Cheryl [name] KCL:[id number] DOB: May 07, 1940 DOA: 08/12/2019  PCP: Letta Median, MD  Admit date: 08/12/2019 Discharge date: 08/16/2019  Time spent: 45 minutes  Recommendations for Outpatient Follow-up:  1. Discharging to skilled facility with hospice following given end-of-life issues 2. Note dosage changes of various rate controlling medications including initiation of digoxin-if it is wished for him would consider renal panel, magnesium and digoxin level in 3 to 5 days given her elevated age 79 and difficulty controlling her A. fib however this is a narrow therapeutic index medication with significant risks-please discuss with family going forward further plans in the outpatient setting 3. We will be discharging on a chest BiPAP-please see FiO2 for settings 4. May may not require 3 to 4 L of oxygen at baseline-she has not desaturated on day of discharge but may require this during the daytime 5. Has some AKI--decision was made to add back low dose Trosemide and Kdur on d/c  6. Please do not initiate meds with the potential for  hypercarbia such as benzodiazepines opiates or mood stabilizing agents-instead gentle redirecting were expressed with her 7. See below discussion regarding glycemic control-would not aggressively control if the goal is purely palliative or hospice   Discharge Diagnoses:  Active Problems:   Cancer of left lung (HCC)   HTN (hypertension)   HLD (hyperlipidemia)   Dementia (HCC)   Type II diabetes mellitus with renal manifestations (HCC)   Atrial flutter (HCC)   Acute on chronic systolic CHF (congestive heart failure) (HCC)   COPD (chronic obstructive pulmonary disease) (HCC)   Acute respiratory failure ([place])   Palliative care by specialist   DNR (do not resuscitate) discussion   Goals of care, counseling/discussion   Acute on chronic congestive heart failure ([place])   Discharge Condition: Guarded  Diet recommendation: Comfort  related  Filed Weights   08/14/19 1131 08/15/19 0420 08/16/19 0601  Weight: 88.4 kg 84.5 kg 89.9 kg    History of present illness:  79 black female from [place] home (previously [facility]) H/o HTN HLD DM TY 2 COPD lung cancer status post XRT CKD 3 Reflux Bipolar/schizophrenia 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 EP evaluated switched her to Toprol they did not recommend at that time PPM placement she also carries a diagnosis of paroxysmal A. fibAnd likely sick sinus syndrome Patient discharged from [facility] and readmittedthe next day and was discharged eventually after prolonged stay 07/31/2019 --she developed hypoxic respiratory failure again requiring BiPAP on admission and was weaned off apparently after diuresis During that admission she was placed on amiodarone and discontinued off of it because of ST depressions/5-second posttermination pause cardiology was consulted they did not feel that the time any operative/electrophysiological intervention was warranted  Hospital Course:  1. Hypoxic respiratory failure 2/2 systolic heart failure EF 30-35% a. Required BiPAP the majority of 7/30 but has resolved-she required aggressive resuscitation earlier in hospitalization b. weaned to nasal cannula during the day-needs a chest BiPAP c. Diuretics adjusted during admit--see MAR, weight 91--> 89 on discharge d. No easy fix-see my long discussion 7/30-see below 2. Atrial flutter/sick sinus syndrome intolerant of amiodarone a. Medications adjusted by cardiology  b. currently being loaded with digoxin 8/2 in addition to Toprol-XL 100 twice daily c. See above discussion regarding digoxin monitoring 3. OHSS habitus  a. Previously  not using BiPAP b. We will continue at bedtime BiPAP at settings determined here c. Diet currently being tolerated without concerns for aspiration or hypoxia 4. Schizophrenia/bipolar a. Stop polypharmacy and discontinued this admit Seroquel 100 at bedtime, Namzaric 1 daily, Xanax 0.25 twice daily Trilafon 1 tab 3 times b. Resume Provigil 100 daily and encourage sleep-wake cycle 5. COPD + lung cancer status post XRT a. Details not completely clear-we will resume Xopenex as needed Flonase b. Continuing  Advair or alternate Breo 6.  DM TY 2 a. Was on sliding scale during the hospitalization b. Would not aggressively control sugars if the intent is purely palliative otherwise can start Amaryl 1 mg low-dose in the outpatient setting have not ordered this-monitor CBGs 4 times daily and make a decision in the outpatient setting with regards to this at nursing facility 7. Reflux  Procedures:  Mainly x-rays and BiPAP   Consultations:  Cardiology  Discharge Exam: Vitals:   08/16/19 0843 08/16/19 0846  BP:    Pulse:    Resp:    Temp:    SpO2: 100% 100%    General: Awake pleasant lady confused in no distress thick neck Cardiovascular: S1-S2 seems to be in flutter on the monitors but it is rate controlled at this time 3-1-I cannot convince myself as I see P waves reliably Respiratory: Clear no rales no rhonchi I took her off of BiPAP and examined her she only needed 2 L did not desaturate this morning She is unable to really orient to me smiles comfortably She is ready for skilled facility today with palliative/hospice following  Discharge Instructions   Discharge Instructions    Diet - low sodium heart healthy   Complete by: As directed    Increase activity slowly   Complete by: As directed      Allergies as of 08/16/2019   No Known Allergies     Medication List    STOP taking these medications   amoxicillin-clavulanate 875-125 MG tablet Commonly known as: AUGMENTIN    atorvastatin 20 MG tablet Commonly known as: LIPITOR   diltiazem 120 MG 24 hr capsule Commonly known as: CARDIZEM CD   ferrous sulfate 325 (65 FE) MG tablet   fluticasone 50 MCG/ACT nasal spray Commonly known as: FLONASE   insulin aspart 100 UNIT/ML injection Commonly known as: novoLOG   insulin glargine 100 UNIT/ML injection Commonly known as: LANTUS   magnesium hydroxide 400 MG/5ML suspension Commonly known as: MILK OF MAGNESIA   Metoprolol Tartrate 75 MG Tabs   Namzaric 28-10 MG Cp24 Generic drug: Memantine HCl-Donepezil HCl   perphenazine 4 MG tablet Commonly known as: TRILAFON   QUEtiapine 100 MG tablet Commonly known as: SEROQUEL     TAKE these medications   acetaminophen 325 MG tablet Commonly known as: TYLENOL Take 650 mg by mouth every 4 (four) hours as needed for mild pain or fever.   Advair Diskus 250-50 MCG/DOSE Aepb Generic drug: Fluticasone-Salmeterol Inhale 1 puff into the lungs 2 (two) times daily.   ALPRAZolam 0.25 MG tablet Commonly known as: XANAX Take 1 tablet (0.25 mg total) by mouth 2 (two) times daily.   apixaban 5 MG Tabs tablet Commonly known as: ELIQUIS Take 1 tablet (5 mg total) by mouth 2 (two) times daily.   aspirin 81 MG EC tablet Take 1 tablet (81 mg total) by mouth daily. Swallow whole.   digoxin 0.125 MG tablet Commonly known as: LANOXIN Take 1 tablet (0.125 mg total) by mouth daily.  guaiFENesin 600 MG 12 hr tablet Commonly known as: MUCINEX Take 600 mg by mouth 2 (two) times daily.   levalbuterol 0.63 MG/3ML nebulizer solution Commonly known as: XOPENEX Take 3 mLs (0.63 mg total) by nebulization every 8 (eight) hours.   metoprolol succinate 100 MG 24 hr tablet Commonly known as: TOPROL-XL Take 1 tablet (100 mg total) by mouth 2 (two) times daily. Take with or immediately following a meal.   modafinil 100 MG tablet Commonly known as: PROVIGIL Take 1 tablet (100 mg total) by mouth daily.   omeprazole 40 MG  capsule Commonly known as: PRILOSEC Take 40 mg by mouth daily.   potassium chloride SA 20 MEQ tablet Commonly known as: KLOR-CON Take 2 tablets (40 mEq total) by mouth daily.   torsemide 20 MG tablet Commonly known as: DEMADEX Take 1 tablet (20 mg total) by mouth daily. What changed:   medication strength  how much to take      No Known Allergies    The results of significant diagnostics from this hospitalization (including imaging, microbiology, ancillary and laboratory) are listed below for reference.    Significant Diagnostic Studies: CT Head Wo Contrast  Result Date: 08/12/2019 CLINICAL DATA:  Altered mental status. Neuro deficit, acute, stroke suspected. EXAM: CT HEAD WITHOUT CONTRAST TECHNIQUE: Contiguous axial images were obtained from the base of the skull through the vertex without intravenous contrast. COMPARISON:  MR head without contrast 10/16/2016. CT head without contrast 05/04/2016 FINDINGS: Brain: Mild atrophy and white matter changes are stable. No acute infarct, hemorrhage, or mass lesion is present. Basal ganglia are normal. Insular ribbon is normal bilaterally. The ventricles are of normal size. No significant extraaxial fluid collection is present. Vascular: Atherosclerotic calcifications are present in the cavernous internal carotid arteries. No hyperdense vessel is present. Skull: Calvarium is intact. No focal lytic or blastic lesions are present. No significant extracranial soft tissue lesion is present. Sinuses/Orbits: A fluid level is present in the left maxillary sinus. Single left ethmoid air cell is opacified. Mild mucosal thickening is present in the right sphenoid sinus. The paranasal sinuses and mastoid air cells are otherwise clear. The globes and orbits are within normal limits. IMPRESSION: 1. Stable atrophy and white matter disease. This likely reflects the sequela of chronic microvascular ischemia. 2. No acute intracranial abnormality or significant  interval change. 3. Left maxillary and ethmoid sinus disease. Electronically Signed   By: San Morelle M.D.   On: 08/12/2019 04:59   DG CHEST PORT 1 VIEW  Result Date: 08/13/2019 CLINICAL DATA:  79 year old female with altered mental status. Acute heart failure. Pneumonia. EXAM: PORTABLE CHEST 1 VIEW COMPARISON:  08/12/2019 FINDINGS: 0633 hours. Patient is rotated to the right. Right apex obscured by overlying face. The cardio pericardial silhouette is enlarged. Interval improvement in basilar lung aeration. Right costophrenic angle not included on the film. The visualized bony structures of the thorax show now acute abnormality. Degenerative changes noted in both shoulders. Telemetry leads overlie the chest. IMPRESSION: 1. Limited study due to patient rotation lordotic positioning. 2. Interval improvement in basilar lung aeration. 3. Cardiomegaly with vascular congestion Electronically Signed   By: Misty Stanley M.D.   On: 08/13/2019 11:28   DG Chest Port 1 View  Addendum Date: 08/12/2019   ADDENDUM REPORT: 08/12/2019 10:26 ADDENDUM: This study was originally submitted under another patient's name. The original report was issued at 2:05 a.m. The impression stated cardiomegaly and interstitial edema. Trace bilateral pleural effusions. Electronically Signed   By: Harrell Gave  Mattern M.D.   On: 08/12/2019 10:26   Result Date: 08/12/2019 CLINICAL DATA:  Shortness of breath.  Altered mental status. EXAM: PORTABLE CHEST 1 VIEW COMPARISON:  One-view chest x-ray 07/15/2019 FINDINGS: Heart is enlarged. Atherosclerotic calcifications are present at the aortic arch. Interstitial edema and bilateral effusions are increasing. Bibasilar airspace opacities are increasing, right greater than left. IMPRESSION: 1. Cardiomegaly with increasing interstitial edema and bilateral effusions compatible with congestive heart failure. 2. Bibasilar airspace disease, right greater than left. This likely represents  atelectasis, infection is not excluded. Electronically Signed: By: San Morelle M.D. On: 08/12/2019 04:36    Microbiology: Recent Results (from the past 240 hour(s))  Urine Culture     Status: None   Collection Time: 08/12/19  2:50 AM   Specimen: Urine, Catheterized  Result Value Ref Range Status   Specimen Description URINE, CATHETERIZED  Final   Special Requests NONE  Final   Culture   Final    NO GROWTH Performed at [facility], [address] [place], [place] [address]    Report Status 08/12/2019 FINAL  Final  SARS Coronavirus 2 by RT PCR (hospital order, performed in [facility] hospital lab) Nasopharyngeal Nasopharyngeal Swab     Status: None   Collection Time: 08/12/19  4:29 AM   Specimen: Nasopharyngeal Swab  Result Value Ref Range Status   SARS Coronavirus 2 NEGATIVE NEGATIVE Final    Comment: (NOTE) SARS-CoV-2 target nucleic acids are NOT DETECTED.  The SARS-CoV-2 RNA is generally detectable in upper and lower respiratory specimens during the acute phase of infection. The lowest concentration of SARS-CoV-2 viral copies this assay can detect is 250 copies / mL. A negative result does not preclude SARS-CoV-2 infection and should not be used as the sole basis for treatment or other patient management decisions.  A negative result may occur with improper specimen collection / handling, submission of specimen other than nasopharyngeal swab, presence of viral mutation(s) within the areas targeted by this assay, and inadequate number of viral copies (<250 copies / mL). A negative result must be combined with clinical observations, patient history, and epidemiological information.  Fact Sheet for Patients:   StrictlyIdeas.no  Fact Sheet for Healthcare Providers: BankingDealers.co.za  This test is not yet approved or  cleared by the [place] FDA and has been authorized for detection and/or diagnosis of  SARS-CoV-2 by FDA under an Emergency Use Authorization (EUA).  This EUA will remain in effect (meaning this test can be used) for the duration of the COVID-19 declaration under Section 564(b)(1) of the Act, 21 U.S.C. section 360bbb-3(b)(1), unless the authorization is terminated or revoked sooner.  Performed at [facility], [place] [address], [place], [place] [address]   SARS CORONAVIRUS 2 (TAT 6-24 HRS) Nasopharyngeal Nasopharyngeal Swab     Status: None   Collection Time: 08/15/19  4:46 PM   Specimen: Nasopharyngeal Swab  Result Value Ref Range Status   SARS Coronavirus 2 NEGATIVE NEGATIVE Final    Comment: (NOTE) SARS-CoV-2 target nucleic acids are NOT DETECTED.  The SARS-CoV-2 RNA is generally detectable in upper and lower respiratory specimens during the acute phase of infection. Negative results do not preclude SARS-CoV-2 infection, do not rule out co-infections with other pathogens, and should not be used as the sole basis for treatment or other patient management decisions. Negative results must be combined with clinical observations, patient history, and epidemiological information. The expected result is Negative.  Fact Sheet for Patients: SugarRoll.be  Fact Sheet  for Healthcare Providers: https://www.woods-mathews.com/  This test is not yet approved or cleared by the [place] and  has been authorized for detection and/or diagnosis of SARS-CoV-2 by FDA under an Emergency Use Authorization (EUA). This EUA will remain  in effect (meaning this test can be used) for the duration of the COVID-19 declaration under Se ction 564(b)(1) of the Act, 21 U.S.C. section 360bbb-3(b)(1), unless the authorization is terminated or revoked sooner.  Performed at [facility], [place] [address] [place], [place] [address]      Labs: Basic Metabolic Panel: Recent Labs  Lab 08/12/19 0413 08/12/19 0427 08/12/19 1657  08/13/19 0233 08/14/19 0319 08/15/19 0401 08/16/19 0438  NA 145   < > 144 144 139 136 138  138  K 4.2   < > 3.6 3.3* 4.2 4.6 4.3  4.2  CL 102  --   --  100 94* 95* 97*  97*  CO2 33*  --   --  34* 32 31 29  29   GLUCOSE 154*  --   --  136* 100* 118* 110*  111*  BUN 15  --   --  20 14 19 21  20   CREATININE 1.15*  --   --  1.29* 1.08* 1.44* 1.22*  1.23*  CALCIUM 9.1  --   --  9.3 9.1 8.8* 8.9  8.8*  MG  --   --   --  2.2 1.8  --   --   PHOS  --   --   --   --   --   --  3.2   < > = values in this interval not displayed.   Liver Function Tests: Recent Labs  Lab 08/12/19 0413 08/16/19 0438  AST 50*  --   ALT 63*  --   ALKPHOS 95  --   BILITOT 0.8  --   PROT 7.0  --   ALBUMIN 3.3* 3.0*   No results for input(s): LIPASE, AMYLASE in the last 168 hours. Recent Labs  Lab 08/12/19 0413  AMMONIA 44*   CBC: Recent Labs  Lab 08/12/19 0413 08/12/19 0427 08/12/19 0635 08/12/19 1102 08/12/19 1657 08/13/19 0233 08/15/19 0401  WBC 6.3  --   --   --   --  6.3 7.2  NEUTROABS 5.5  --   --   --   --  4.6  --   HGB 10.4*   < > 11.6* 11.6* 11.6* 9.8* 11.6*  HCT 36.2   < > 34.0* 34.0* 34.0* 32.9* 38.3  MCV 94.0  --   --   --   --  90.1 89.1  PLT 272  --   --   --   --  326 357   < > = values in this interval not displayed.   Cardiac Enzymes: No results for input(s): CKTOTAL, CKMB, CKMBINDEX, TROPONINI in the last 168 hours. BNP: BNP (last 3 results) Recent Labs    07/15/19 0023 08/12/19 0413 08/13/19 0233  BNP 1,618.7* 1,621.7* 943.5*    ProBNP (last 3 results) No results for input(s): PROBNP in the last 8760 hours.  CBG: Recent Labs  Lab 08/13/19 1130 08/13/19 1656 08/13/19 2116 08/14/19 0820 08/14/19 1124  GLUCAP 142* 166* 112* 111* 213*       Signed:  Nita Sells MD   [facility] 08/16/2019, 8:51 AM

## 2019-08-16 NOTE — Plan of Care (Addendum)
Patient is unable to participate in educational objectives because she is only oriented to self and unable to process information. Physical objectives met and patient is ready for discharge to [facility].   Received phone call from Mancel Bale at [facility] and report given at 1620.

## 2019-08-16 NOTE — Progress Notes (Addendum)
Progress Note  Patient Name: Cheryl [name] Date of Encounter: 08/16/2019  [facility] Cardiologist: Kathlyn Sacramento, MD   Subjective   Pt breathing easily, no complaints  Inpatient Medications    Scheduled Meds: . apixaban  5 mg Oral BID  . digoxin  0.125 mg Oral Daily  . ferrous sulfate  325 mg Oral BID  . fluticasone  2 spray Each Nare Daily  . fluticasone furoate-vilanterol  1 puff Inhalation Daily  . guaiFENesin  600 mg Oral BID  . levalbuterol  0.63 mg Nebulization TID  . metoprolol succinate  100 mg Oral BID  . modafinil  100 mg Oral Daily  . potassium chloride  40 mEq Oral Daily  . sodium chloride flush  3 mL Intravenous Q12H   Continuous Infusions: . sodium chloride     PRN Meds: sodium chloride, acetaminophen, sodium chloride flush   Vital Signs    Vitals:   08/15/19 2241 08/15/19 2341 08/16/19 0601 08/16/19 0820  BP:  (!) 150/83 (!) 149/82 (!) 152/85  Pulse: 67 68 67 66  Resp: 19 16 19 17   Temp:  98 F (36.7 C) 97.6 F (36.4 C) 97.6 F (36.4 C)  TempSrc:  Axillary Axillary Oral  SpO2: 100% 100% 93% 100%  Weight:   89.9 kg     Intake/Output Summary (Last 24 hours) at 08/16/2019 0836 Last data filed at 08/16/2019 0603 Gross per 24 hour  Intake --  Output 600 ml  Net -600 ml   Last 3 Weights 08/16/2019 08/15/2019 08/14/2019  Weight (lbs) 198 lb 3.1 oz 186 lb 3.2 oz 194 lb 14.4 oz  Weight (kg) 89.9 kg 84.46 kg 88.406 kg      Telemetry   Atrial flutter, rate well-controlled, rare PVCs, 6 bt run NSVT - Personally Reviewed  ECG    None today - Personally Reviewed  Physical Exam   GEN: No acute distress.   Neck: minimal JVD Cardiac: Irreg R&R, no murmur, no rubs, or gallops.  Respiratory: diminished to auscultation bilateral bases GI: Soft, nontender, non-distended  MS: No edema; No deformity. Neuro:  Nonfocal  Psych: Normal affect   Labs    High Sensitivity Troponin:   Recent Labs  Lab 07/18/19 1246 07/19/19 1208 07/19/19 1535  08/12/19 0413 08/12/19 0606  TROPONINIHS 38* 30* 36* 56* 49*      Chemistry Recent Labs  Lab 08/12/19 0413 08/12/19 0427 08/14/19 0319 08/15/19 0401 08/16/19 0438  NA 145   < > 139 136 138  138  K 4.2   < > 4.2 4.6 4.3  4.2  CL 102   < > 94* 95* 97*  97*  CO2 33*   < > 32 31 29  29   GLUCOSE 154*   < > 100* 118* 110*  111*  BUN 15   < > 14 19 21  20   CREATININE 1.15*   < > 1.08* 1.44* 1.22*  1.23*  CALCIUM 9.1   < > 9.1 8.8* 8.9  8.8*  PROT 7.0  --   --   --   --   ALBUMIN 3.3*  --   --   --  3.0*  AST 50*  --   --   --   --   ALT 63*  --   --   --   --   ALKPHOS 95  --   --   --   --   BILITOT 0.8  --   --   --   --  GFRNONAA 46*   < > 49* 35* 43*  42*  GFRAA 53*   < > 57* 40* 49*  49*  ANIONGAP 10   < > 13 10 12  12    < > = values in this interval not displayed.     Hematology Recent Labs  Lab 08/12/19 0413 08/12/19 0427 08/12/19 1657 08/13/19 0233 08/15/19 0401  WBC 6.3  --   --  6.3 7.2  RBC 3.85*  --   --  3.65* 4.30  HGB 10.4*   < > 11.6* 9.8* 11.6*  HCT 36.2   < > 34.0* 32.9* 38.3  MCV 94.0  --   --  90.1 89.1  MCH 27.0  --   --  26.8 27.0  MCHC 28.7*  --   --  29.8* 30.3  RDW 14.6  --   --  14.4 14.3  PLT 272  --   --  326 357   < > = values in this interval not displayed.    BNP Recent Labs  Lab 08/12/19 0413 08/13/19 0233  BNP 1,621.7* 943.5*     DDimer No results for input(s): DDIMER in the last 168 hours.   Radiology    No results found.  Cardiac Studies   ECHO: 07/04/2019 1. Left ventricular ejection fraction, by estimation, is 30 to 35%. The  left ventricle has moderately decreased function. The left ventricle  demonstrates global hypokinesis. There is mild left ventricular  hypertrophy. Left ventricular diastolic parameters are indeterminate.  2. Right ventricular systolic function is mildly reduced. The right  ventricular size is mildly enlarged. There is mildly elevated pulmonary  artery systolic pressure.  3.  Left atrial size was mildly dilated.  4. Right atrial size was moderately dilated.  5. The mitral valve is normal in structure. Trivial mitral valve  regurgitation. No evidence of mitral stenosis.  6. Tricuspid valve regurgitation is moderate.  7. The aortic valve is normal in structure. Aortic valve regurgitation is  trivial. Mild aortic valve sclerosis is present, with no evidence of  aortic valve stenosis.  8. Moderately dilated pulmonary artery.   Patient Profile     Cheryl [name] is a 79 y.o. female with a hx of dementia, schizophenia, mental retardation, COPD, DM, GERD, HTN, HLD, lung CA s/p prior XRT, paroxysmal atrial flutter with probable sick sinus syndrome with post-temination pauses, LV dysfunction with chronic systolic CHF, chronic appearing anemia who was admitted 07/30 with CHF and atrial flutter.  Assessment & Plan    1. 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 Aflutter - now on high-dose metoprolol and dig - HR control greatly improved - Dig level Monday, can do at facility - Eliquis for anticoag  Otherwise, per IM  Palliative has seen for long term goals of care.   [facility] will sign off.   Medication Recommendations: Eliquis 5 mg twice daily, digoxin 0.125 mg daily, Toprol-XL 100  mg twice daily, torsemide 20 mg daily with potassium 40 meq daily Other recommendations (labs, testing, etc):  BMET within 1 week.  Digoxin level on 8/9 Follow up as an outpatient:  We will schedule outpatient  follow-up   For questions or updates, please contact [facility] Please consult [email or web address] for contact info under      Signed, Rosaria Ferries, PA-C  08/16/2019, 8:36 AM    Patient seen and examined.  Agree with above documentation.  On exam, patient is oriented to person only,  regular rate and rhythm, no murmurs, lungs CTAB, no LE edema or JVD.  Telemetry personally reviewed, shows significant improvement in rates, currently in 4-1 atrial flutter with rate in 60s.  Continue metoprolol and digoxin.  Renal function stable, will restart PO torsemide.  Will need digoxin level checked on Monday.  Will schedule outpatient follow-up.  Donato Heinz, MD

## 2019-08-24 ENCOUNTER — Other Ambulatory Visit: Payer: Self-pay

## 2019-08-24 ENCOUNTER — Non-Acute Institutional Stay: Payer: Medicare Other | Admitting: Adult Health Nurse Practitioner

## 2019-08-24 DIAGNOSIS — Z515 Encounter for palliative care: Secondary | ICD-10-CM

## 2019-08-24 DIAGNOSIS — J9621 Acute and chronic respiratory failure with hypoxia: Secondary | ICD-10-CM

## 2019-08-24 NOTE — Progress Notes (Signed)
[facility] Consult Note Telephone: [phone]  Fax: [phone]  PATIENT NAME: Cheryl [name] DOB: December 07, 1940 MRN: [id number]  PRIMARY CARE PROVIDER:   Letta Median, MD  REFERRING PROVIDER:  Roddie Mc PA-C/Dr. Nyra Capes  RESPONSIBLE PARTY:   Lattie Corns, son/HCPOA  [phone]     RECOMMENDATIONS and PLAN:  1.  Advanced care planning. Patient is DNR.  Spoke with son, Cheryl [name], to update on visit  2.  Functional status.  Patient is bedbound and requires total assistance with ADLs.  She is able to feed herself.  Incontinent of bowel and bladder.  She is mostly nonverbal and when she does speak she is incoherent.  She does follow some commands.  Staff reports that she is getting PT services currently.  Continue supportive care at facility  3.  Nutritional status.  Staff reports that patient's appetite is good.  No reported weight loss.  Current weight is 198 pounds.  Spoke with son who was expecting hospice referral as this was the recommendation from the hospital.  Discussed hospice versus palliative.  Patient has had functional decline and is now bedbound and now requires BiPAP at night.  Son is still interested in hospice services and is also interested in having his mother moved to a skilled nursing facility that is closer to [place] where her family lives.  Have reached out to our office to see if this was meant to be a high hospice referral rather than a palliative referral have reached out to DON at facility and left voice message with reason for call and contact information for call back to discuss this matter further.  I spent 60 minutes providing this consultation,  from 9:00 to 10:00 including time spent with patient/family, chart review, provider coordination, documentation. More than 50% of the time in this consultation was spent coordinating communication.   HISTORY OF PRESENT ILLNESS:  Cheryl [name] is a 79 y.o. year  old female with multiple medical problems including COPD, CHF with EF 30-35%, CKD stage 2, a-fib, HTN, dementia, mental retardation, schizophrenia. Palliative Care was asked to help address goals of care. Patient has had 3 long hospitalizations over the past 3 months for COPD exacerbations.  Was treated at facility for pneumonia at end of July.  Last hospitalization was 7/30-08/16/2019 for COPD exacerbation and pneumonia.  Was discharged on BiPAP.  Discharged with hospice recommendation.  Palliative referral was made.  CODE STATUS: DNR  PPS: 30% HOSPICE ELIGIBILITY/DIAGNOSIS: TBD  PHYSICAL EXAM:  HR 46  O2 99% on 2L General: NAD, frail appearing Cardiovascular:irregularly regular rate and rhythm Pulmonary: clear ant fields; normal respiratory effort Abdomen: soft, nontender, + bowel sounds GU: no suprapubic tenderness Extremities: no edema, no joint deformities Skin: no rashes on exposed skin Neurological: Weakness; patient mostly nonverbal, when speaks she does not make sense.     PAST MEDICAL HISTORY:  Past Medical History:  Diagnosis Date  . Cardiomyopathy ([place])   . CKD (chronic kidney disease), stage III   . COPD (chronic obstructive pulmonary disease) ([place])   . Dementia ([place])   . Diabetes mellitus type 2 in obese ([place])   . GERD (gastroesophageal reflux disease)   . Hyperlipidemia   . Hypertension   . Lung cancer ([place])   . Mental retardation   . Paroxysmal atrial flutter ([place])   . Schizophrenia ([place])   . Sick sinus syndrome ([place])    post termination pauses    SOCIAL HX:  Social History   Tobacco  Use  . Smoking status: Former Smoker    Packs/day: 0.25    Types: Cigarettes    Quit date: 08/30/2016    Years since quitting: 2.9  . Smokeless tobacco: Never Used  Substance Use Topics  . Alcohol use: No    ALLERGIES: No Known Allergies   PERTINENT MEDICATIONS:  Outpatient Encounter Medications as of 08/24/2019  Medication Sig  . acetaminophen (TYLENOL) 325 MG tablet  Take 650 mg by mouth every 4 (four) hours as needed for mild pain or fever.   . ALPRAZolam (XANAX) 0.25 MG tablet Take 1 tablet (0.25 mg total) by mouth 2 (two) times daily.  Marland Kitchen apixaban (ELIQUIS) 5 MG TABS tablet Take 1 tablet (5 mg total) by mouth 2 (two) times daily.  Marland Kitchen aspirin EC 81 MG EC tablet Take 1 tablet (81 mg total) by mouth daily. Swallow whole.  . digoxin (LANOXIN) 0.125 MG tablet Take 1 tablet (0.125 mg total) by mouth daily.  . Fluticasone-Salmeterol (ADVAIR DISKUS) 250-50 MCG/DOSE AEPB Inhale 1 puff into the lungs 2 (two) times daily.  Marland Kitchen guaiFENesin (MUCINEX) 600 MG 12 hr tablet Take 600 mg by mouth 2 (two) times daily.  Marland Kitchen levalbuterol (XOPENEX) 0.63 MG/3ML nebulizer solution Take 3 mLs (0.63 mg total) by nebulization every 8 (eight) hours.  . metoprolol succinate (TOPROL-XL) 100 MG 24 hr tablet Take 1 tablet (100 mg total) by mouth 2 (two) times daily. Take with or immediately following a meal.  . modafinil (PROVIGIL) 100 MG tablet Take 1 tablet (100 mg total) by mouth daily.  Marland Kitchen omeprazole (PRILOSEC) 40 MG capsule Take 40 mg by mouth daily.   . potassium chloride SA (KLOR-CON) 20 MEQ tablet Take 2 tablets (40 mEq total) by mouth daily.  Marland Kitchen torsemide (DEMADEX) 20 MG tablet Take 1 tablet (20 mg total) by mouth daily.   No facility-administered encounter medications on file as of 08/24/2019.     Hughey Rittenberry Jenetta Downer, NP

## 2019-09-14 ENCOUNTER — Non-Acute Institutional Stay: Payer: Medicare Other | Admitting: Adult Health Nurse Practitioner

## 2019-09-14 ENCOUNTER — Other Ambulatory Visit: Payer: Self-pay

## 2019-09-14 DIAGNOSIS — Z515 Encounter for palliative care: Secondary | ICD-10-CM

## 2019-09-14 DIAGNOSIS — J9621 Acute and chronic respiratory failure with hypoxia: Secondary | ICD-10-CM

## 2019-09-14 NOTE — Progress Notes (Signed)
[facility] Consult Note Telephone: [phone]  Fax: [phone]  PATIENT NAME: Cheryl [name] DOB: 11/10/1940 MRN: [id number]  PRIMARY CARE PROVIDER:   Letta Median, MD  REFERRING PROVIDER:  Roddie Mc PA-C/Dr. Nyra Capes  RESPONSIBLE PARTY:   Lattie Corns, son/HCPOA  [phone]     RECOMMENDATIONS and PLAN:  1.  Advanced care planning. Patient is DNR.  Attempted to call son to update on visit.  Unable to leave VM due to VM box full.  2.  Functional status.  Patient is bedbound and requires total assistance with ADLs.  She is able to feed herself. Appetite is good with no reported weight loss.  Incontinent of bowel and bladder.  She is mostly nonverbal and when she does speak she is incoherent.  She does follow some commands.  Staff reports that she is improving with therapy.  Continue supportive care at facility and therapy as ordered  Previously son had decided not to pursue hospice as his mother was improving with therapy.  No new concerns reported by staff today. Denies pain, falls, dizziness, increased SOB or cough, N/V/D, constipation.  Palliative will continue to monitor for symptom management/decline and make recommendations as needed.  Will follow up in 4-6 weeks and as needed.  I spent 30 minutes providing this consultation,  from 10:10 to 10:40 including time spent with patient/family, chart review, provider coordination, documentation. More than 50% of the time in this consultation was spent coordinating communication.   HISTORY OF PRESENT ILLNESS:  Cheryl [name] is a 79 y.o. year old female with multiple medical problems including COPD, CHF with EF 30-35%, CKD stage 2, a-fib, HTN, dementia, mental retardation, schizophrenia. Palliative Care was asked to help address goals of care.   CODE STATUS: DNR  PPS: 30% HOSPICE ELIGIBILITY/DIAGNOSIS: TBD  PHYSICAL EXAM:   General: NAD, frail  appearing Cardiovascular:irregularly regular rate and rhythm Pulmonary: clear ant fields; normal respiratory effort Abdomen: soft, nontender, + bowel sounds GU: no suprapubic tenderness Extremities: no edema, no joint deformities Skin: no rashes on exposed skin Neurological: Weakness; patient mostly nonverbal, when speaks she does not make sense.    PAST MEDICAL HISTORY:  Past Medical History:  Diagnosis Date  . Cardiomyopathy ([place])   . CKD (chronic kidney disease), stage III   . COPD (chronic obstructive pulmonary disease) ([place])   . Dementia ([place])   . Diabetes mellitus type 2 in obese ([place])   . GERD (gastroesophageal reflux disease)   . Hyperlipidemia   . Hypertension   . Lung cancer ([place])   . Mental retardation   . Paroxysmal atrial flutter ([place])   . Schizophrenia ([place])   . Sick sinus syndrome ([place])    post termination pauses    SOCIAL HX:  Social History   Tobacco Use  . Smoking status: Former Smoker    Packs/day: 0.25    Types: Cigarettes    Quit date: 08/30/2016    Years since quitting: 3.0  . Smokeless tobacco: Never Used  Substance Use Topics  . Alcohol use: No    ALLERGIES: No Known Allergies   PERTINENT MEDICATIONS:  Outpatient Encounter Medications as of 09/14/2019  Medication Sig  . acetaminophen (TYLENOL) 325 MG tablet Take 650 mg by mouth every 4 (four) hours as needed for mild pain or fever.   . ALPRAZolam (XANAX) 0.25 MG tablet Take 1 tablet (0.25 mg total) by mouth 2 (two) times daily.  Marland Kitchen apixaban (ELIQUIS) 5 MG TABS tablet Take 1 tablet (  5 mg total) by mouth 2 (two) times daily.  Marland Kitchen aspirin EC 81 MG EC tablet Take 1 tablet (81 mg total) by mouth daily. Swallow whole.  . digoxin (LANOXIN) 0.125 MG tablet Take 1 tablet (0.125 mg total) by mouth daily.  . Fluticasone-Salmeterol (ADVAIR DISKUS) 250-50 MCG/DOSE AEPB Inhale 1 puff into the lungs 2 (two) times daily.  Marland Kitchen guaiFENesin (MUCINEX) 600 MG 12 hr tablet Take 600 mg by mouth 2 (two) times daily.  Marland Kitchen  levalbuterol (XOPENEX) 0.63 MG/3ML nebulizer solution Take 3 mLs (0.63 mg total) by nebulization every 8 (eight) hours.  . metoprolol succinate (TOPROL-XL) 100 MG 24 hr tablet Take 1 tablet (100 mg total) by mouth 2 (two) times daily. Take with or immediately following a meal.  . modafinil (PROVIGIL) 100 MG tablet Take 1 tablet (100 mg total) by mouth daily.  Marland Kitchen omeprazole (PRILOSEC) 40 MG capsule Take 40 mg by mouth daily.   . potassium chloride SA (KLOR-CON) 20 MEQ tablet Take 2 tablets (40 mEq total) by mouth daily.  Marland Kitchen torsemide (DEMADEX) 20 MG tablet Take 1 tablet (20 mg total) by mouth daily.   No facility-administered encounter medications on file as of 09/14/2019.     Alysandra Lobue Jenetta Downer, NP

## 2019-10-06 ENCOUNTER — Ambulatory Visit: Payer: Medicare Other | Admitting: Physician Assistant

## 2019-10-12 ENCOUNTER — Other Ambulatory Visit: Payer: Self-pay

## 2019-10-12 ENCOUNTER — Encounter: Payer: Self-pay | Admitting: Family

## 2019-10-12 ENCOUNTER — Ambulatory Visit (INDEPENDENT_AMBULATORY_CARE_PROVIDER_SITE_OTHER): Payer: Medicare Other | Admitting: Family

## 2019-10-12 VITALS — BP 122/72 | HR 53 | Ht 64.0 in | Wt 187.0 lb

## 2019-10-12 DIAGNOSIS — I5022 Chronic systolic (congestive) heart failure: Secondary | ICD-10-CM

## 2019-10-12 DIAGNOSIS — Z7901 Long term (current) use of anticoagulants: Secondary | ICD-10-CM | POA: Diagnosis not present

## 2019-10-12 DIAGNOSIS — I4819 Other persistent atrial fibrillation: Secondary | ICD-10-CM

## 2019-10-12 DIAGNOSIS — Z79899 Other long term (current) drug therapy: Secondary | ICD-10-CM

## 2019-10-12 DIAGNOSIS — I4892 Unspecified atrial flutter: Secondary | ICD-10-CM | POA: Diagnosis not present

## 2019-10-12 NOTE — Patient Instructions (Addendum)
Medication Instructions:  No medication changes today  *If you need a refill on your cardiac medications before your next appointment, please call your pharmacy*   Lab Work: If digoxin level not collected since 08/16/19, please collect at SNF.   Testing/Procedures: EKG today shows atrial fibrillation 53 bpm with known RBBB and stable TWI in lateral leads.   Follow-Up: At [facility], you and your health needs are our priority.  As part of our continuing mission to provide you with exceptional heart care, we have created designated Provider Care Teams.  These Care Teams include your primary Cardiologist (physician) and Advanced Practice Providers (APPs -  Physician Assistants and Nurse Practitioners) who all work together to provide you with the care you need, when you need it.  We recommend signing up for the patient portal called "MyChart".  Sign up information is provided on this After Visit Summary.  MyChart is used to connect with patients for Virtual Visits (Telemedicine).  Patients are able to view lab/test results, encounter notes, upcoming appointments, etc.  Non-urgent messages can be sent to your provider as well.   To learn more about what you can do with MyChart, go to [email or web address].    Your next appointment:   3 month(s)  The format for your next appointment:   In Person  Provider:   Kathlyn Sacramento, MD (only)   Other Instructions <2 L fluid per day Low salt diet Report HR consistently <55 bpm

## 2019-10-12 NOTE — Progress Notes (Signed)
Office Visit    Patient Name: Cheryl [name] Date of Encounter: 10/12/2019  Primary Care Provider:  Maryella Shivers, MD Primary Cardiologist:  Kathlyn Sacramento, MD Electrophysiologist:  None   Chief Complaint    Cheryl [name] is a 79 y.o. 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 Cardiomyopathy ([place])   . CKD (chronic kidney disease), stage III   . COPD (chronic obstructive pulmonary disease) ([place])   . Dementia ([place])   . Diabetes mellitus type 2 in obese ([place])   . GERD (gastroesophageal reflux disease)   . Hyperlipidemia   . Hypertension   . Lung cancer ([place])   . Mental retardation   . Paroxysmal atrial flutter ([place])   . Schizophrenia ([place])   . Sick sinus syndrome ([place])    post termination pauses   Past Surgical History:  Procedure Laterality Date  . ABDOMINAL HYSTERECTOMY      Allergies  No Known Allergies  History of Present Illness    Cheryl [name] is a 79 y.o. 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  She has had multiple hospitalizations over the last six months. Admitted 04/2019 with acute on chronic hypoxic respiratory failure and COPD exacerbation treated with BIPAP, steroid, abx. Admitted 07/03/19-07/14/19 respiratory failure/COPD exacerbation. Found to be in atrial flutter with RVR then junctional rhythm followed by sinus pause and bradycardia requiring atropine and dopamine drip. EP evaluated with recommendation to switch to Toprol and no indication for PPM at that time. She was seen in ED on day of discharge and readmitted for hypoxia and concern for CHF. Echo with LVEF 35%. Discharged 07/31/19. Admitted  08/12/19-08/16/19 with hypoxic respiratory failure again requiring BIPAP and diuresis. She was discharged with hospice care.   She was last evaluated by palliative care provider 09/14/19. Per their note she is improving with therapy and son had decided to not pursue hospice care as she is improving. They have repeat follow up in 4-6 weeks.   Presents today for follow up. History very limited due to her cognitive status. She has dementia and known echolalia. Unfortunately the staff member from her SNF with her today is merely for transportation and is not familiar with her care. Today Miss Earlywine denies chest pain, palpitations, shortness of breath, edema.  However, history taking very limited.   Medication list as well as vitals were obtained from SNF, but no recent lab work available.   EKGs/Labs/Other Studies Reviewed:   The following studies were reviewed today: ECHO: 07/04/2019  1. Left ventricular ejection fraction, by estimation, is 30 to 35%. The  left ventricle has moderately decreased function. The left ventricle  demonstrates global hypokinesis. There is mild left ventricular  hypertrophy. Left ventricular diastolic parameters are indeterminate.   2. Right ventricular systolic function is mildly reduced. The right  ventricular size is mildly enlarged. There is mildly elevated pulmonary  artery systolic pressure.   3. Left atrial size was mildly dilated.   4. Right atrial size was moderately dilated.   5. The mitral valve is normal in structure. Trivial mitral valve  regurgitation. No evidence of mitral stenosis.   6. Tricuspid valve regurgitation is moderate.  7. The aortic valve is normal in structure. Aortic valve regurgitation is  trivial. Mild aortic valve sclerosis is present, with no evidence of  aortic valve stenosis.   8. Moderately dilated pulmonary artery.   EKG:  EKG is  ordered today.  The ekg ordered today demonstrates Rate controlled atrial fib 53 bpm with known RBBB  and stable TWI lateral leads.  Recent Labs: 07/15/2019: TSH 1.100 08/12/2019: ALT 63 08/13/2019: B Natriuretic Peptide 943.5 08/14/2019: Magnesium 1.8 08/15/2019: Hemoglobin 11.6; Platelets 357 08/16/2019: BUN 21; BUN 20; Creatinine, Ser 1.22; Creatinine, Ser 1.23; Potassium 4.3; Potassium 4.2; Sodium 138; Sodium 138  Recent Lipid Panel    Component Value Date/Time   CHOL 157 07/04/2019 0659   CHOL 141 07/10/2011 0234   TRIG 55 07/04/2019 0659   TRIG 22 07/10/2011 0234   HDL 66 07/04/2019 0659   HDL 71 (H) 07/10/2011 0234   CHOLHDL 2.4 07/04/2019 0659   VLDL 11 07/04/2019 0659   VLDL 4 (L) 07/10/2011 0234   LDLCALC 80 07/04/2019 0659   LDLCALC 66 07/10/2011 0234    Home Medications   Current Meds  Medication Sig  . acetaminophen (TYLENOL) 325 MG tablet Take 650 mg by mouth every 4 (four) hours as needed for mild pain or fever.   . ALPRAZolam (XANAX) 0.25 MG tablet Take 1 tablet (0.25 mg total) by mouth 2 (two) times daily.  Marland Kitchen apixaban (ELIQUIS) 5 MG TABS tablet Take 1 tablet (5 mg total) by mouth 2 (two) times daily.  Marland Kitchen aspirin EC 81 MG EC tablet Take 1 tablet (81 mg total) by mouth daily. Swallow whole.  . digoxin (LANOXIN) 0.125 MG tablet Take 1 tablet (0.125 mg total) by mouth daily.  . Fluticasone-Salmeterol (ADVAIR DISKUS) 250-50 MCG/DOSE AEPB Inhale 1 puff into the lungs 2 (two) times daily.  Marland Kitchen guaiFENesin (MUCINEX) 600 MG 12 hr tablet Take 600 mg by mouth 2 (two) times daily.  Marland Kitchen levalbuterol (XOPENEX) 0.63 MG/3ML nebulizer solution Take 3 mLs (0.63 mg total) by nebulization every 8 (eight) hours.  . metoprolol succinate (TOPROL-XL) 100 MG 24 hr tablet Take 1 tablet (100 mg total) by mouth 2 (two) times daily. Take with or immediately following a meal.  . modafinil (PROVIGIL) 100 MG tablet Take 1 tablet (100 mg total) by mouth daily.  Marland Kitchen omeprazole (PRILOSEC) 40 MG capsule Take 40 mg by mouth daily.     Review of Systems    Review of Systems  Constitutional: Negative for  chills, fever and malaise/fatigue.  Cardiovascular: Negative for chest pain, dyspnea on exertion, irregular heartbeat, leg swelling, near-syncope, orthopnea, palpitations and syncope.  Respiratory: Negative for cough, shortness of breath and wheezing.   Gastrointestinal: Negative for melena, nausea and vomiting.  Genitourinary: Negative for hematuria.  Neurological: Negative for dizziness, light-headedness and weakness.   All other systems reviewed and are otherwise negative except as noted above.  Physical Exam    VS:  BP 122/72 (BP Location: Left Arm, Patient Position: Sitting, Cuff Size: Normal)   Pulse (!) 53   Ht 5\' 4"  (1.626 m)   Wt 187 lb (84.8 kg) Comment: checked Sept. 1 at [facility]  SpO2 98% Comment: on 2 liters of oxygen  BMI 32.10 kg/m  , BMI Body mass index is 32.1 kg/m. GEN: Well nourished, overweight, well developed, in no acute distress. HEENT: normal. Neck: Supple, no JVD, carotid bruits, or masses. Cardiac: bradycardic, no murmurs, rubs, or gallops. No clubbing, cyanosis, edema.  Radials/DP/PT 2+ and equal  bilaterally.  Respiratory:  Respirations regular and unlabored, clear to auscultation bilaterally. GI: Soft, nontender, nondistended. MS: No deformity or atrophy. Skin: Warm and dry, no rash. Neuro:  Strength and sensation are intact. Alert to self only.  Psych: Normal affect.  Assessment & Plan    1. Chronic systolic heart failure - Assessment of volume status made difficult by dementia as staff member from SNF today is not familiar with her care. Grossly euvolemic by exam. Weights stable per SNF readings. She is no longer on Torsemide as prescribed at recent discharge, unclear why discontinued. As she is grossly euvolemic we will defer addition but recommend monitoring carefully. GDMT includes Digoxin, Toprol. Will request any recent BMP from SNF. Could consider addition of ARB. Recommend <2L fluid intake and low salt diet.   2. Atrial  flutter/fibrillation - Maintaining NSR by EKG today. Bradycardic rate 53 bpm however on review from SNF documentation HR routinely 60bpm - 90bpm. Continue Digoxin 0.125mg  daily, Toprol 100mg  daily. Will maintain present doses. Recommend careful monitoring of HR at SNF. If symptomatic bradycardia or HR routinely <55bpm, recommend reduction in AV nodal blocking agents.   3. High risk medication use - On digoxin secondary to paroxysmal atrial fib/flutter. Rate has been difficult to control in recent hospitalization. Amiodarone previously discontinued due to sinus pause/bradycardia. Continue Digoxin 0.125mg  daily. Will request recent digoxin level from SNF. If not collected since discharge, will collect collection.   4. Chronic anticoagulation - Secondary to atrial fib/flutter. Will request any recent CBC from SNF. Continue Eliquis 5mg  BID, does not meet criteria for reduced dose. Recommend SNF monitor carefully for signs of symptoms of bleeding including hematuria, melena.   Disposition: Follow up in 3 month(s) with Dr. Fletcher Anon or APP.    Loel Dubonnet, NP 10/12/2019, 9:22 AM

## 2019-12-23 ENCOUNTER — Non-Acute Institutional Stay: Payer: Medicare Other | Admitting: Adult Health Nurse Practitioner

## 2019-12-23 ENCOUNTER — Other Ambulatory Visit: Payer: Self-pay

## 2019-12-23 DIAGNOSIS — F0391 Unspecified dementia with behavioral disturbance: Secondary | ICD-10-CM

## 2019-12-23 DIAGNOSIS — Z515 Encounter for palliative care: Secondary | ICD-10-CM

## 2019-12-23 DIAGNOSIS — J42 Unspecified chronic bronchitis: Secondary | ICD-10-CM

## 2019-12-23 NOTE — Progress Notes (Signed)
[facility] Consult Note Telephone: [phone]  Fax: [phone]  PATIENT NAME: Cheryl [name] DOB: [birth year] MRN: [id number]  PRIMARY CARE PROVIDER:   Maryella Shivers, MD  REFERRING PROVIDER:  Maryella Shivers, MD [address] [place],  [place] [address]  RESPONSIBLE PARTY:  Cheryl [name], son/HCPOA [phone]   Chief complaint: Follow-up palliative visit/dementia       RECOMMENDATIONS and PLAN:  1.  Advanced care planning.  Patient is DNR.  Spoke with patient and his wife to update on visit today  2.  Dementia.  Patient is bedbound and requires total assistance with ADLs.  She is able to feed herself.  Appetite good with no weight loss.  Patient is incontinent of bowel and bladder.  She is following commands today.  Patient more verbal today than usual.  No reports of anxiety/agitation.  Continue supportive care at facility.  3.  COPD.  Patient not having any increase shortness of breath or cough.  Continue current medications as prescribed  Palliative will continue to monitor for symptom management/decline and make recommendations as needed.  Follow-up in 8 to 10 weeks.  Family encouraged to call with any questions or concerns   I spent 30 minutes providing this consultation. More than 50% of the time in this consultation was spent coordinating communication.   HISTORY OF PRESENT ILLNESS:  Cheryl [name] is a 79 y.o. year old female with multiple medical problems including COPD, CHF with EF 30-35%, CKD stage 2, a-fib, HTN, dementia, mental retardation, schizophrenia. Palliative Care was asked to help address goals of care.  Staff has no new concerns for patient today.  Patient has not had any falls, infection, hospital visits since last visit.  Patient had dressings to her feet on previous visits for wounds and for prevention of wounds.  No wounds noted on feet, ankles, lower extremities today.  ROS unobtainable by  patient due to dementia.  Received history from staff and chart review.  CODE STATUS: DNR  PPS: 30% HOSPICE ELIGIBILITY/DIAGNOSIS: TBD  PHYSICAL EXAM:  HR 68 O2 95% on room air General: NAD, frail appearing Cardiovascular:irregularlyregular rate and rhythm Pulmonary: clear ant fields; normal respiratory effort Abdomen: soft, nontender, + bowel sounds Extremities: no edema, no joint deformities Skin: no rasheson exposed skin Neurological: Weakness; patient more interactive and verbal today than usual   PAST MEDICAL HISTORY:  Past Medical History:  Diagnosis Date  . Cardiomyopathy ([place])   . CKD (chronic kidney disease), stage III   . COPD (chronic obstructive pulmonary disease) ([place])   . Dementia ([place])   . Diabetes mellitus type 2 in obese ([place])   . GERD (gastroesophageal reflux disease)   . Hyperlipidemia   . Hypertension   . Lung cancer ([place])   . Mental retardation   . Paroxysmal atrial flutter ([place])   . Schizophrenia ([place])   . Sick sinus syndrome ([place])    post termination pauses    SOCIAL HX:  Social History   Tobacco Use  . Smoking status: Former Smoker    Packs/day: 0.25    Types: Cigarettes    Quit date: 08/30/2016    Years since quitting: 3.3  . Smokeless tobacco: Never Used  Substance Use Topics  . Alcohol use: No    ALLERGIES: No Known Allergies   PERTINENT MEDICATIONS:  Outpatient Encounter Medications as of 12/23/2019  Medication Sig  . acetaminophen (TYLENOL) 325 MG tablet Take 650 mg by mouth every 4 (four) hours as needed  for mild pain or fever.   . ALPRAZolam (XANAX) 0.25 MG tablet Take 1 tablet (0.25 mg total) by mouth 2 (two) times daily.  Marland Kitchen apixaban (ELIQUIS) 5 MG TABS tablet Take 1 tablet (5 mg total) by mouth 2 (two) times daily.  Marland Kitchen aspirin EC 81 MG EC tablet Take 1 tablet (81 mg total) by mouth daily. Swallow whole.  . digoxin (LANOXIN) 0.125 MG tablet Take 1 tablet (0.125 mg total) by mouth daily.  . Fluticasone-Salmeterol (ADVAIR  DISKUS) 250-50 MCG/DOSE AEPB Inhale 1 puff into the lungs 2 (two) times daily.  Marland Kitchen guaiFENesin (MUCINEX) 600 MG 12 hr tablet Take 600 mg by mouth 2 (two) times daily.  Marland Kitchen levalbuterol (XOPENEX) 0.63 MG/3ML nebulizer solution Take 3 mLs (0.63 mg total) by nebulization every 8 (eight) hours.  . metoprolol succinate (TOPROL-XL) 100 MG 24 hr tablet Take 1 tablet (100 mg total) by mouth 2 (two) times daily. Take with or immediately following a meal.  . modafinil (PROVIGIL) 100 MG tablet Take 1 tablet (100 mg total) by mouth daily.  Marland Kitchen omeprazole (PRILOSEC) 40 MG capsule Take 40 mg by mouth daily.    No facility-administered encounter medications on file as of 12/23/2019.    Cheryl [name] Jenetta Downer, NP

## 2019-12-27 ENCOUNTER — Ambulatory Visit (INDEPENDENT_AMBULATORY_CARE_PROVIDER_SITE_OTHER): Payer: Medicare Other | Admitting: Cardiovascular Disease

## 2019-12-27 ENCOUNTER — Encounter: Payer: Self-pay | Admitting: Cardiovascular Disease

## 2019-12-27 ENCOUNTER — Other Ambulatory Visit: Payer: Self-pay

## 2019-12-27 VITALS — BP 140/76 | HR 61 | Ht 64.0 in | Wt 187.0 lb

## 2019-12-27 DIAGNOSIS — I480 Paroxysmal atrial fibrillation: Secondary | ICD-10-CM

## 2019-12-27 DIAGNOSIS — I5022 Chronic systolic (congestive) heart failure: Secondary | ICD-10-CM | POA: Diagnosis not present

## 2019-12-27 NOTE — Progress Notes (Signed)
Cardiology Office Note   Date:  12/27/2019   ID:  Cheryl [name], DOB 04/28/1940, MRN [id number]  PCP:  Maryella Shivers, MD  Cardiologist:  Kathlyn Sacramento, MD   Chief Complaint  Patient presents with  . Other    3 month follow up. Patient has no complaints. Meds reviewed verbally with patient.       History of Present Illness: Cheryl [name] is a 79 y.o. female who presents for a follow-up visit regarding chronic systolic heart failure and atrial fibrillation. She has known history of essential hypertension, hyperlipidemia, type 2 diabetes, COPD, lung cancer status post radiation therapy, chronic kidney disease, GERD, bipolar/schizophrenia with moderate severe dementia. She had multiple hospitalizations over the last 6 months due to respiratory failure.  She was diagnosed with atrial flutter with RVR during one of her hospitalizations in June.  She was also hospitalized for heart failure with an EF of 35%.  She was seen by palliative care and initially there was consideration for hospice care but the patient's condition improved. She is staying at [facility] facility.  Not able to obtain history from her due to dementia but she denies chest pain, shortness of breath or palpitations.   Past Medical History:  Diagnosis Date  . Cardiomyopathy ([place])   . CKD (chronic kidney disease), stage III ([place])   . COPD (chronic obstructive pulmonary disease) ([place])   . Dementia ([place])   . Diabetes mellitus type 2 in obese ([place])   . GERD (gastroesophageal reflux disease)   . Hyperlipidemia   . Hypertension   . Lung cancer ([place])   . Mental retardation   . Paroxysmal atrial flutter ([place])   . Schizophrenia ([place])   . Sick sinus syndrome ([place])    post termination pauses    Past Surgical History:  Procedure Laterality Date  . ABDOMINAL HYSTERECTOMY       Current Outpatient Medications  Medication Sig Dispense Refill  . acetaminophen (TYLENOL) 325 MG tablet Take 650 mg  by mouth every 4 (four) hours as needed for mild pain or fever.     Marland Kitchen apixaban (ELIQUIS) 5 MG TABS tablet Take 1 tablet (5 mg total) by mouth 2 (two) times daily. 60 tablet   . aspirin EC 81 MG EC tablet Take 1 tablet (81 mg total) by mouth daily. Swallow whole. 30 tablet 11  . digoxin (LANOXIN) 0.125 MG tablet Take 1 tablet (0.125 mg total) by mouth daily.    . Fluticasone-Salmeterol (ADVAIR) 250-50 MCG/DOSE AEPB Inhale 1 puff into the lungs 2 (two) times daily.    Marland Kitchen levalbuterol (XOPENEX) 0.63 MG/3ML nebulizer solution Take 3 mLs (0.63 mg total) by nebulization every 8 (eight) hours. 3 mL 0  . metoprolol succinate (TOPROL-XL) 100 MG 24 hr tablet Take 1 tablet (100 mg total) by mouth 2 (two) times daily. Take with or immediately following a meal.    . modafinil (PROVIGIL) 100 MG tablet Take 1 tablet (100 mg total) by mouth daily. 30 tablet 0  . omeprazole (PRILOSEC) 40 MG capsule Take 40 mg by mouth daily.      No current facility-administered medications for this visit.    Allergies:   Patient has no known allergies.    Social History:  The patient  reports that she quit smoking about 3 years ago. Her smoking use included cigarettes. She smoked 0.25 packs per day. She has never used smokeless tobacco. She reports that she does not drink alcohol and does not use drugs.  Family History:  The patient's family history is not on file.    ROS:  Please see the history of present illness.   Otherwise, review of systems are positive for none.   All other systems are reviewed and negative.    PHYSICAL EXAM: VS:  BP 140/76 (BP Location: Right Arm, Patient Position: Sitting, Cuff Size: Normal)   Pulse 61   Ht 5\' 4"  (1.626 m)   Wt 187 lb (84.8 kg)   SpO2 96%   BMI 32.10 kg/m  , BMI Body mass index is 32.1 kg/m. GEN: Well nourished, well developed, in no acute distress  HEENT: normal  Neck: no JVD, carotid bruits, or masses Cardiac: RRR with premature beats; no murmurs, rubs, or gallops,no  edema  Respiratory:  clear to auscultation bilaterally, normal work of breathing GI: soft, nontender, nondistended, + BS MS: no deformity or atrophy  Skin: warm and dry, no rash Neuro:  Strength and sensation are intact Psych: Not oriented.   EKG:  EKG is ordered today. The ekg ordered today demonstrates sinus rhythm with PVCs.   Recent Labs: 07/15/2019: TSH 1.100 08/12/2019: ALT 63 08/13/2019: B Natriuretic Peptide 943.5 08/14/2019: Magnesium 1.8 08/15/2019: Hemoglobin 11.6; Platelets 357 08/16/2019: BUN 21; BUN 20; Creatinine, Ser 1.22; Creatinine, Ser 1.23; Potassium 4.3; Potassium 4.2; Sodium 138; Sodium 138    Lipid Panel    Component Value Date/Time   CHOL 157 07/04/2019 0659   CHOL 141 07/10/2011 0234   TRIG 55 07/04/2019 0659   TRIG 22 07/10/2011 0234   HDL 66 07/04/2019 0659   HDL 71 (H) 07/10/2011 0234   CHOLHDL 2.4 07/04/2019 0659   VLDL 11 07/04/2019 0659   VLDL 4 (L) 07/10/2011 0234   LDLCALC 80 07/04/2019 0659   LDLCALC 66 07/10/2011 0234      Wt Readings from Last 3 Encounters:  12/27/19 187 lb (84.8 kg)  10/12/19 187 lb (84.8 kg)  08/16/19 198 lb 3.1 oz (89.9 kg)      No flowsheet data found.    ASSESSMENT AND PLAN:  1.  Chronic systolic heart failure: She appears to be euvolemic.  Continue treatment with Toprol.  She does have underlying chronic kidney disease but we can consider adding an ACE inhibitor or ARB upon follow-up if blood pressure remains stable. No plans for ischemic cardiac evaluation given age and advanced dementia.  2.  Paroxysmal atrial flutter/fibrillation: She is maintaining in sinus rhythm with PVCs.  Continue high-dose Toprol.  Given that she is in sinus rhythm and considering her age, I discontinued digoxin today to minimize the risk of toxicity and bradycardia.  In addition, given that she is on anticoagulation with Eliquis, I discontinued aspirin.    Disposition:   FU with me in 4 months  Signed,  Kathlyn Sacramento, MD   12/27/2019 1:44 PM    [place]

## 2019-12-27 NOTE — Patient Instructions (Addendum)
Medication Instructions:   Your physician has recommended you make the following change in your medication:   1.  STOP taking your Aspirin. 2.  STOP taking your Digoxin.   Lab Work: None Ordered  If you have labs (blood work) drawn today and your tests are completely normal, you will receive your results only by:  [place] (if you have MyChart) OR  A paper copy in the mail If you have any lab test that is abnormal or we need to change your treatment, we will call you to review the results.   Testing/Procedures: None Ordered   Follow-Up: At [facility], you and your health needs are our priority.  As part of our continuing mission to provide you with exceptional heart care, we have created designated Provider Care Teams.  These Care Teams include your primary Cardiologist (physician) and Advanced Practice Providers (APPs -  Physician Assistants and Nurse Practitioners) who all work together to provide you with the care you need, when you need it.  We recommend signing up for the patient portal called "MyChart".  Sign up information is provided on this After Visit Summary.  MyChart is used to connect with patients for Virtual Visits (Telemedicine).  Patients are able to view lab/test results, encounter notes, upcoming appointments, etc.  Non-urgent messages can be sent to your provider as well.   To learn more about what you can do with MyChart, go to [email or web address].    Your next appointment:   4 month(s)  The format for your next appointment:   In Person  Provider:   You may see Kathlyn Sacramento, MD or one of the following Advanced Practice Providers on your designated Care Team:    Murray Hodgkins, NP  Christell Faith, PA-C  Marrianne Mood, PA-C  Cadence Fisher, [place]  Laurann Montana, NP    Other Instructions

## 2020-03-13 DEATH — deceased

## 2020-05-01 ENCOUNTER — Ambulatory Visit: Payer: Medicaid Other | Admitting: Cardiovascular Disease

## 2020-12-01 IMAGING — CT CT HEAD W/O CM
4 series · 16 of 47 positions shown, 18 images · non-contrast
Comparison: MR head without contrast 10/16/2016. CT head without
contrast 05/04/2016

CLINICAL DATA: Altered mental status. Neuro deficit, acute, stroke
suspected.

EXAM:
CT HEAD WITHOUT CONTRAST
TECHNIQUE: Contiguous axial images were obtained from the base of the skull
through the vertex without intravenous contrast.

[Series 3: head without · axial · non-contrast · 0.46mm/px · z∈[+521,+631]mm · 6 of 32 slices shown, 8 images]
[im 5/32  brain]
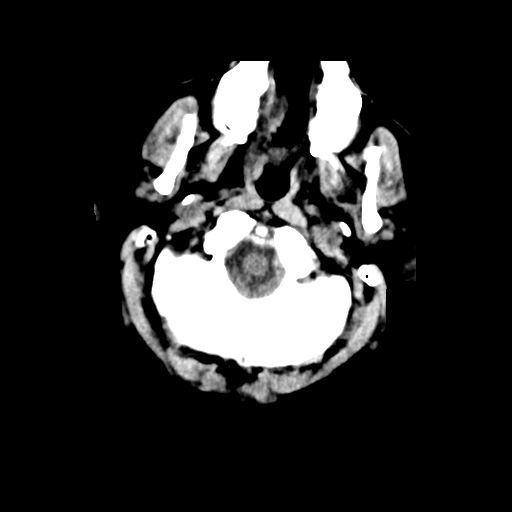
[im 5/32  bone]
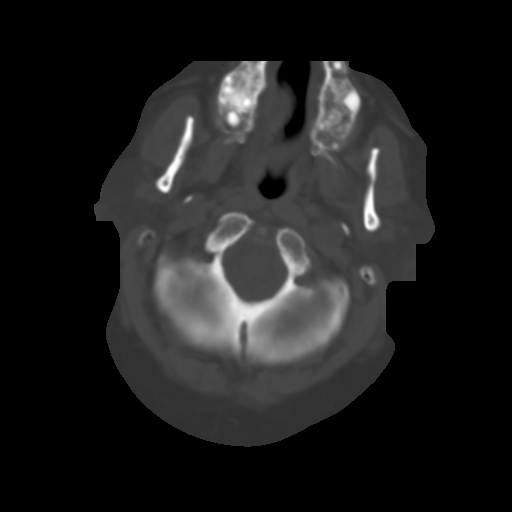
[im 9/32  brain]
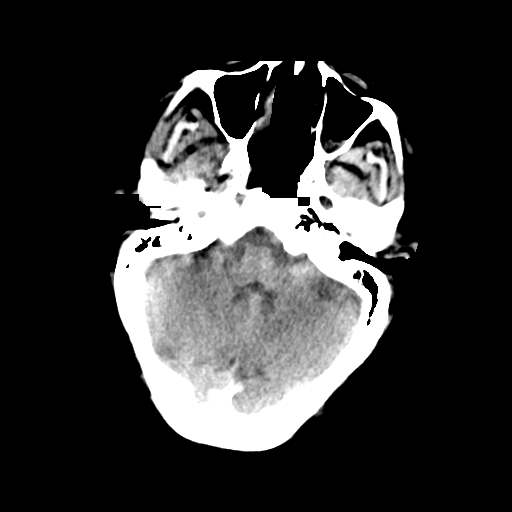
[im 14/32  brain]
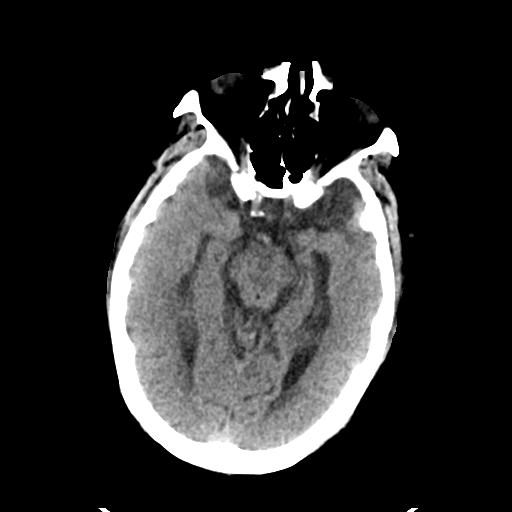
[im 18/32  brain]
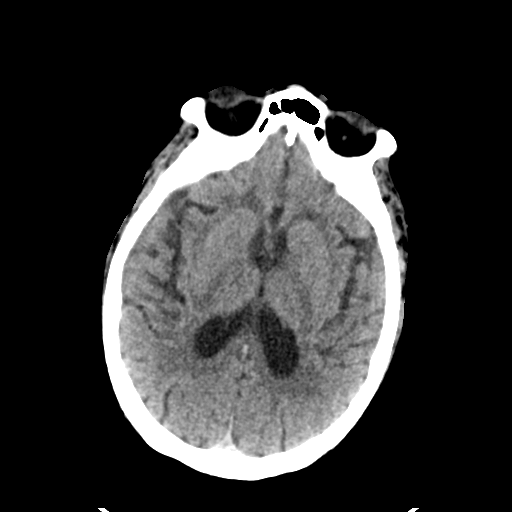
[im 23/32  brain]
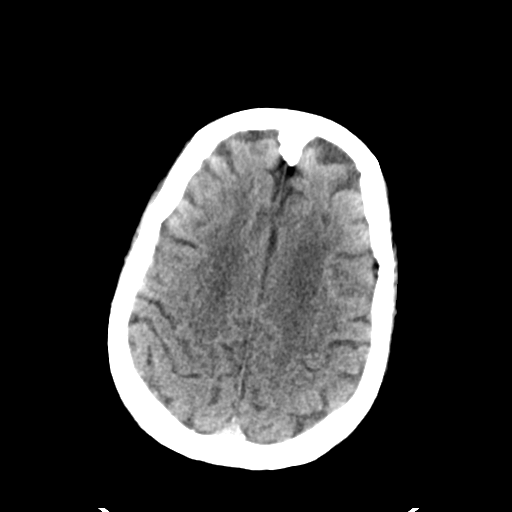
[im 23/32  bone]
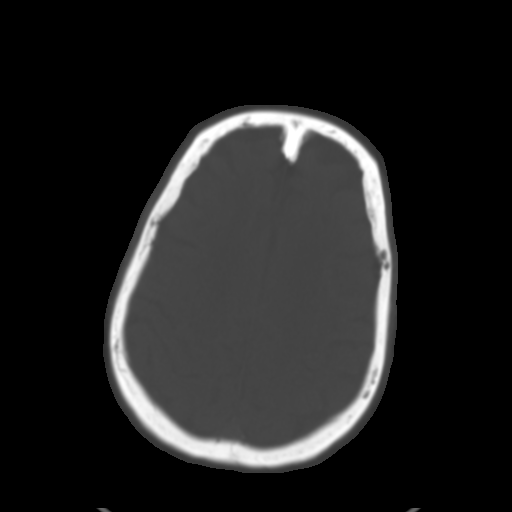
[im 27/32  brain]
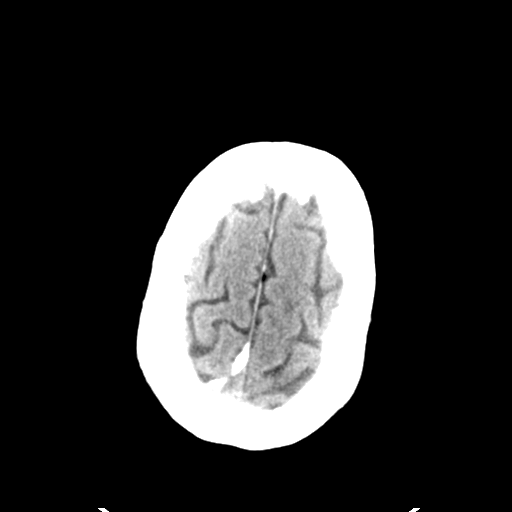

[Series 4: head bone · axial · 0.46mm/px · z∈[+515,+571]mm · 4 of 83 slices shown]
[im 8/83  bone]
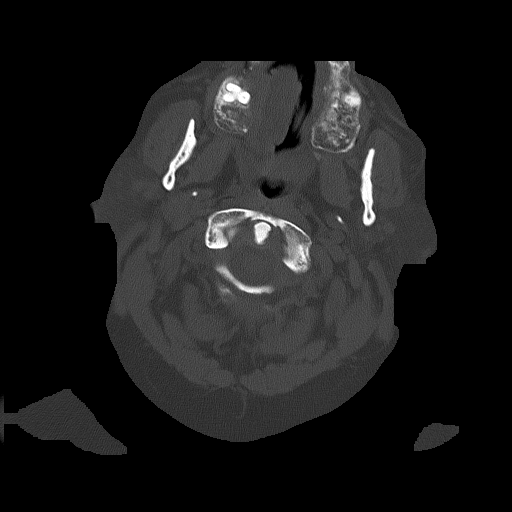
[im 16/83  bone]
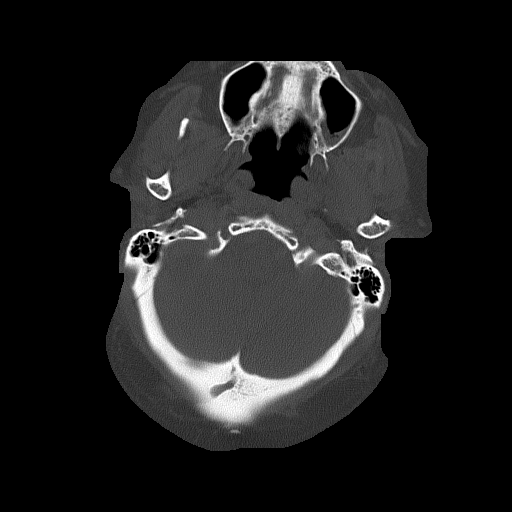
[im 28/83  bone]
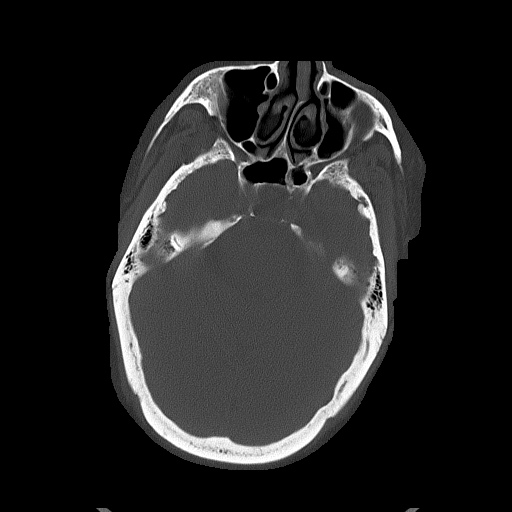
[im 36/83  bone]
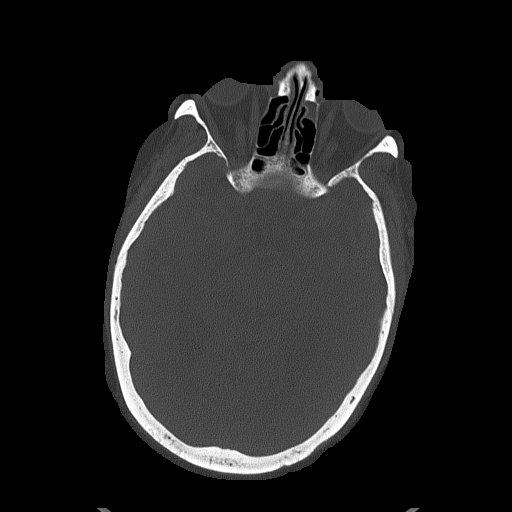

[Series 5: head without cor · coronal · non-contrast · 0.40mm/px · 3 of 76 slices shown]
[im 26/76  brain]
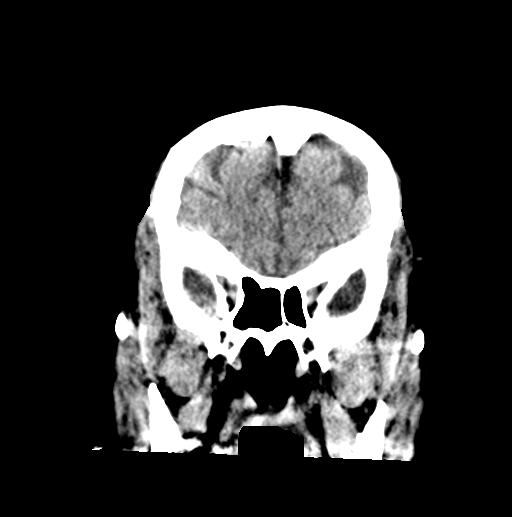
[im 34/76  brain]
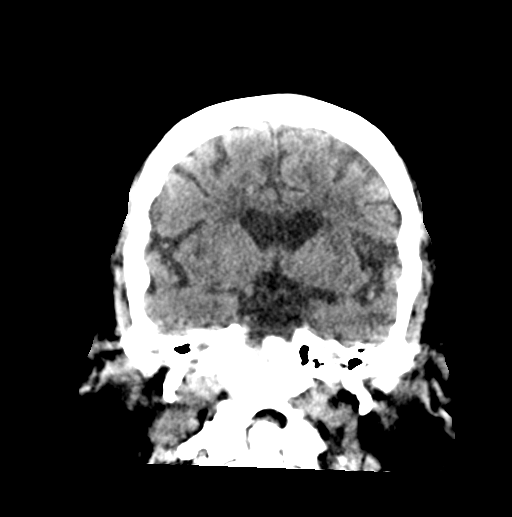
[im 42/76  brain]
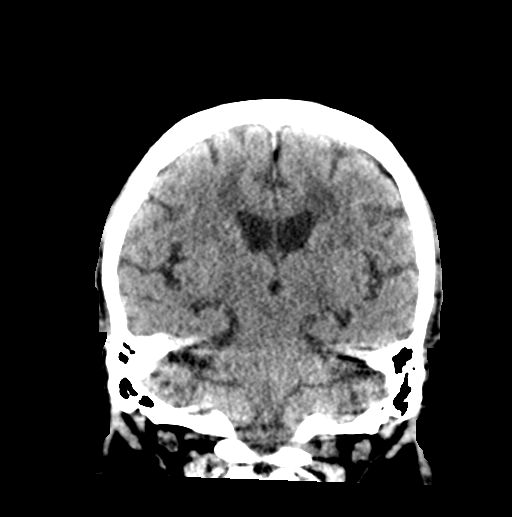

[Series 6: head without sag · sagittal · non-contrast · 0.38mm/px · 3 of 67 slices shown]
[im 23/67  brain]
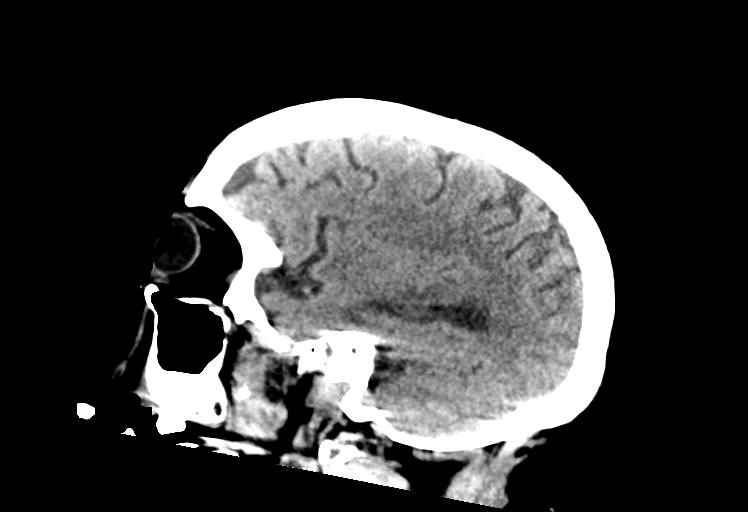
[im 34/67  brain]
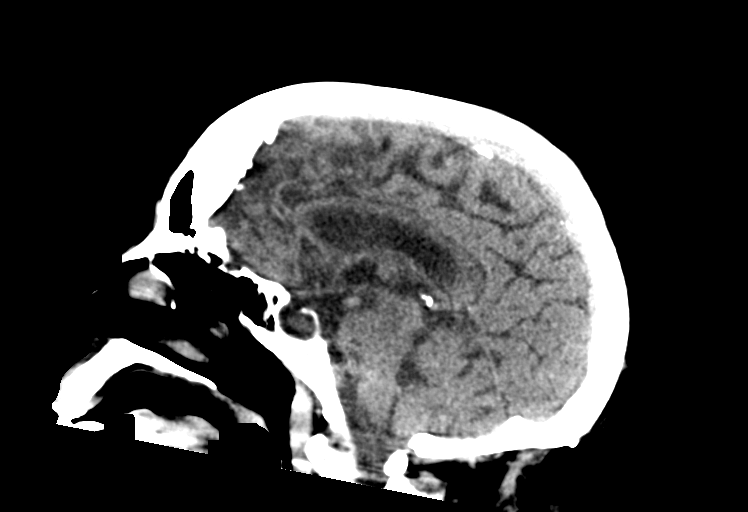
[im 45/67  brain]
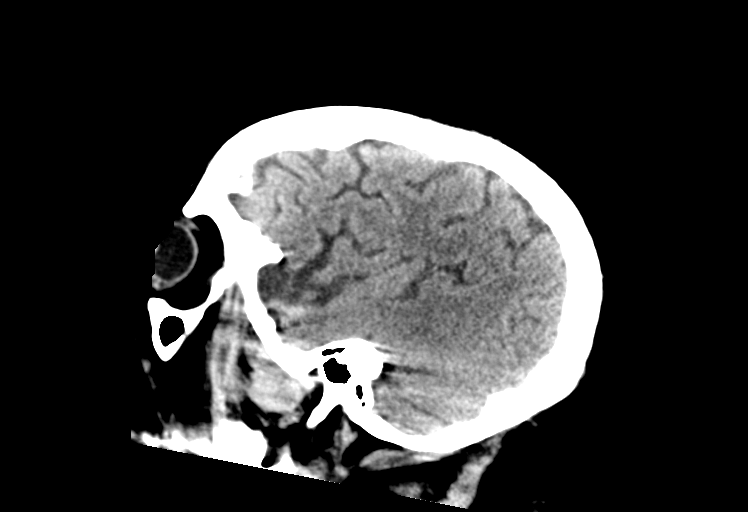

[16 of 47 positions shown; findings below may reference images not displayed]

FINDINGS: Brain: Mild atrophy and white matter changes are stable. No acute
infarct, hemorrhage, or mass lesion is present. Basal ganglia are
normal. Insular ribbon is normal bilaterally. The ventricles are of
normal size. No significant extraaxial fluid collection is present.

Vascular: Atherosclerotic calcifications are present in the
cavernous internal carotid arteries. No hyperdense vessel is
present.

Skull: Calvarium is intact. No focal lytic or blastic lesions are
present. No significant extracranial soft tissue lesion is present.

Sinuses/Orbits: A fluid level is present in the left maxillary
sinus. Single left ethmoid air cell is opacified. Mild mucosal
thickening is present in the right sphenoid sinus. The paranasal
sinuses and mastoid air cells are otherwise clear. The globes and
orbits are within normal limits.
IMPRESSION: 1. Stable atrophy and white matter disease. This likely reflects the
sequela of chronic microvascular ischemia.
2. No acute intracranial abnormality or significant interval change.
3. Left maxillary and ethmoid sinus disease.
